# Patient Record
Sex: Female | Born: 1950 | Race: White | Hispanic: No | Marital: Married | Smoking: Never smoker
Health system: Southern US, Community
[De-identification: ages and names within clinical notes are randomized; demographics above are authoritative.]

## PROBLEM LIST (undated history)

## (undated) DIAGNOSIS — K829 Disease of gallbladder, unspecified: Secondary | ICD-10-CM

## (undated) DIAGNOSIS — J302 Other seasonal allergic rhinitis: Secondary | ICD-10-CM

## (undated) DIAGNOSIS — Z9109 Other allergy status, other than to drugs and biological substances: Secondary | ICD-10-CM

## (undated) DIAGNOSIS — K589 Irritable bowel syndrome without diarrhea: Secondary | ICD-10-CM

## (undated) DIAGNOSIS — D126 Benign neoplasm of colon, unspecified: Secondary | ICD-10-CM

## (undated) DIAGNOSIS — R0602 Shortness of breath: Secondary | ICD-10-CM

## (undated) DIAGNOSIS — C44319 Basal cell carcinoma of skin of other parts of face: Secondary | ICD-10-CM

## (undated) DIAGNOSIS — J45909 Unspecified asthma, uncomplicated: Secondary | ICD-10-CM

## (undated) DIAGNOSIS — N289 Disorder of kidney and ureter, unspecified: Secondary | ICD-10-CM

## (undated) DIAGNOSIS — R87619 Unspecified abnormal cytological findings in specimens from cervix uteri: Secondary | ICD-10-CM

## (undated) DIAGNOSIS — Z86718 Personal history of other venous thrombosis and embolism: Secondary | ICD-10-CM

## (undated) DIAGNOSIS — M722 Plantar fascial fibromatosis: Secondary | ICD-10-CM

## (undated) DIAGNOSIS — R7303 Prediabetes: Secondary | ICD-10-CM

## (undated) DIAGNOSIS — E785 Hyperlipidemia, unspecified: Secondary | ICD-10-CM

## (undated) DIAGNOSIS — Z9889 Other specified postprocedural states: Secondary | ICD-10-CM

## (undated) DIAGNOSIS — K219 Gastro-esophageal reflux disease without esophagitis: Secondary | ICD-10-CM

## (undated) DIAGNOSIS — R42 Dizziness and giddiness: Secondary | ICD-10-CM

## (undated) DIAGNOSIS — E669 Obesity, unspecified: Secondary | ICD-10-CM

## (undated) DIAGNOSIS — I82409 Acute embolism and thrombosis of unspecified deep veins of unspecified lower extremity: Secondary | ICD-10-CM

## (undated) DIAGNOSIS — Z87442 Personal history of urinary calculi: Secondary | ICD-10-CM

## (undated) DIAGNOSIS — Z8719 Personal history of other diseases of the digestive system: Secondary | ICD-10-CM

## (undated) DIAGNOSIS — F419 Anxiety disorder, unspecified: Secondary | ICD-10-CM

## (undated) DIAGNOSIS — M2550 Pain in unspecified joint: Secondary | ICD-10-CM

## (undated) DIAGNOSIS — R112 Nausea with vomiting, unspecified: Secondary | ICD-10-CM

## (undated) DIAGNOSIS — M1990 Unspecified osteoarthritis, unspecified site: Secondary | ICD-10-CM

## (undated) DIAGNOSIS — J45990 Exercise induced bronchospasm: Secondary | ICD-10-CM

## (undated) DIAGNOSIS — N201 Calculus of ureter: Secondary | ICD-10-CM

## (undated) HISTORY — DX: Unspecified asthma, uncomplicated: J45.909

## (undated) HISTORY — DX: Unspecified osteoarthritis, unspecified site: M19.90

## (undated) HISTORY — DX: Gastro-esophageal reflux disease without esophagitis: K21.9

## (undated) HISTORY — DX: Benign neoplasm of colon, unspecified: D12.6

## (undated) HISTORY — DX: Unspecified abnormal cytological findings in specimens from cervix uteri: R87.619

## (undated) HISTORY — DX: Personal history of other venous thrombosis and embolism: Z86.718

## (undated) HISTORY — DX: Plantar fascial fibromatosis: M72.2

## (undated) HISTORY — DX: Pain in unspecified joint: M25.50

## (undated) HISTORY — DX: Shortness of breath: R06.02

## (undated) HISTORY — DX: Obesity, unspecified: E66.9

## (undated) HISTORY — DX: Disorder of kidney and ureter, unspecified: N28.9

## (undated) HISTORY — PX: MOHS SURGERY: SUR867

## (undated) HISTORY — DX: Prediabetes: R73.03

## (undated) HISTORY — DX: Disease of gallbladder, unspecified: K82.9

## (undated) HISTORY — PX: CRYOTHERAPY: SHX1416

## (undated) HISTORY — DX: Irritable bowel syndrome, unspecified: K58.9

---

## 1989-06-23 HISTORY — PX: TUBAL LIGATION: SHX77

## 1990-06-23 HISTORY — PX: ABDOMINAL HYSTERECTOMY: SHX81

## 2000-02-06 ENCOUNTER — Encounter: Payer: Self-pay | Admitting: Obstetrics and Gynecology

## 2000-02-06 ENCOUNTER — Ambulatory Visit (HOSPITAL_COMMUNITY): Admission: RE | Admit: 2000-02-06 | Discharge: 2000-02-06 | Payer: Self-pay | Admitting: Obstetrics and Gynecology

## 2001-12-30 ENCOUNTER — Other Ambulatory Visit: Admission: RE | Admit: 2001-12-30 | Discharge: 2001-12-30 | Payer: Self-pay | Admitting: Obstetrics and Gynecology

## 2002-01-12 ENCOUNTER — Encounter: Payer: Self-pay | Admitting: Obstetrics and Gynecology

## 2002-01-12 ENCOUNTER — Ambulatory Visit (HOSPITAL_COMMUNITY): Admission: RE | Admit: 2002-01-12 | Discharge: 2002-01-12 | Payer: Self-pay | Admitting: Obstetrics and Gynecology

## 2003-01-24 ENCOUNTER — Encounter: Admission: RE | Admit: 2003-01-24 | Discharge: 2003-01-24 | Payer: Self-pay | Admitting: Obstetrics and Gynecology

## 2003-01-24 ENCOUNTER — Encounter: Payer: Self-pay | Admitting: Obstetrics and Gynecology

## 2004-01-09 ENCOUNTER — Ambulatory Visit (HOSPITAL_COMMUNITY): Admission: RE | Admit: 2004-01-09 | Discharge: 2004-01-09 | Payer: Self-pay | Admitting: Obstetrics and Gynecology

## 2004-01-31 ENCOUNTER — Ambulatory Visit (HOSPITAL_COMMUNITY): Admission: RE | Admit: 2004-01-31 | Discharge: 2004-01-31 | Payer: Self-pay | Admitting: Gastroenterology

## 2004-02-05 ENCOUNTER — Encounter: Admission: RE | Admit: 2004-02-05 | Discharge: 2004-02-05 | Payer: Self-pay | Admitting: Obstetrics and Gynecology

## 2004-02-07 ENCOUNTER — Encounter: Admission: RE | Admit: 2004-02-07 | Discharge: 2004-02-07 | Payer: Self-pay | Admitting: Obstetrics and Gynecology

## 2004-08-01 ENCOUNTER — Encounter: Admission: RE | Admit: 2004-08-01 | Discharge: 2004-08-01 | Payer: Self-pay | Admitting: Obstetrics and Gynecology

## 2005-02-12 ENCOUNTER — Encounter: Admission: RE | Admit: 2005-02-12 | Discharge: 2005-02-12 | Payer: Self-pay | Admitting: Obstetrics and Gynecology

## 2005-02-12 LAB — HM MAMMOGRAPHY

## 2005-07-31 ENCOUNTER — Encounter: Admission: RE | Admit: 2005-07-31 | Discharge: 2005-07-31 | Payer: Self-pay | Admitting: Obstetrics and Gynecology

## 2005-09-04 ENCOUNTER — Encounter (INDEPENDENT_AMBULATORY_CARE_PROVIDER_SITE_OTHER): Payer: Self-pay | Admitting: *Deleted

## 2005-09-04 ENCOUNTER — Ambulatory Visit (HOSPITAL_COMMUNITY): Admission: RE | Admit: 2005-09-04 | Discharge: 2005-09-05 | Payer: Self-pay | Admitting: Urology

## 2005-09-10 ENCOUNTER — Ambulatory Visit (HOSPITAL_COMMUNITY): Admission: RE | Admit: 2005-09-10 | Discharge: 2005-09-10 | Payer: Self-pay | Admitting: Family Medicine

## 2005-12-05 HISTORY — PX: OTHER SURGICAL HISTORY: SHX169

## 2005-12-22 ENCOUNTER — Ambulatory Visit: Payer: Self-pay | Admitting: Family Medicine

## 2006-01-19 ENCOUNTER — Ambulatory Visit: Payer: Self-pay | Admitting: Internal Medicine

## 2006-01-27 ENCOUNTER — Ambulatory Visit: Payer: Self-pay | Admitting: Family Medicine

## 2006-02-16 ENCOUNTER — Encounter: Admission: RE | Admit: 2006-02-16 | Discharge: 2006-02-16 | Payer: Self-pay | Admitting: Obstetrics and Gynecology

## 2006-02-27 ENCOUNTER — Ambulatory Visit: Payer: Self-pay | Admitting: Family Medicine

## 2006-05-11 ENCOUNTER — Ambulatory Visit: Payer: Self-pay | Admitting: Family Medicine

## 2006-08-05 ENCOUNTER — Ambulatory Visit: Payer: Self-pay | Admitting: Family Medicine

## 2006-08-05 ENCOUNTER — Encounter: Admission: RE | Admit: 2006-08-05 | Discharge: 2006-08-05 | Payer: Self-pay | Admitting: Physician Assistant

## 2007-02-01 ENCOUNTER — Ambulatory Visit: Payer: Self-pay | Admitting: Family Medicine

## 2007-03-10 ENCOUNTER — Encounter: Admission: RE | Admit: 2007-03-10 | Discharge: 2007-03-10 | Payer: Self-pay | Admitting: Obstetrics & Gynecology

## 2007-04-19 ENCOUNTER — Ambulatory Visit: Payer: Self-pay | Admitting: Family Medicine

## 2007-08-10 ENCOUNTER — Encounter (INDEPENDENT_AMBULATORY_CARE_PROVIDER_SITE_OTHER): Payer: Self-pay | Admitting: Family Medicine

## 2007-08-24 ENCOUNTER — Ambulatory Visit: Payer: Self-pay | Admitting: Family Medicine

## 2008-01-21 ENCOUNTER — Ambulatory Visit: Payer: Self-pay | Admitting: Family Medicine

## 2008-02-17 ENCOUNTER — Ambulatory Visit: Payer: Self-pay | Admitting: Family Medicine

## 2008-03-14 ENCOUNTER — Encounter: Admission: RE | Admit: 2008-03-14 | Discharge: 2008-03-14 | Payer: Self-pay | Admitting: Obstetrics and Gynecology

## 2008-04-06 ENCOUNTER — Ambulatory Visit: Payer: Self-pay | Admitting: Family Medicine

## 2008-06-07 ENCOUNTER — Ambulatory Visit: Payer: Self-pay | Admitting: Family Medicine

## 2008-08-21 ENCOUNTER — Ambulatory Visit: Payer: Self-pay | Admitting: Family Medicine

## 2008-10-13 ENCOUNTER — Ambulatory Visit: Payer: Self-pay | Admitting: Family Medicine

## 2008-12-13 ENCOUNTER — Ambulatory Visit: Payer: Self-pay | Admitting: Family Medicine

## 2009-03-15 ENCOUNTER — Encounter: Admission: RE | Admit: 2009-03-15 | Discharge: 2009-03-15 | Payer: Self-pay | Admitting: Obstetrics and Gynecology

## 2009-03-23 ENCOUNTER — Ambulatory Visit: Payer: Self-pay | Admitting: Family Medicine

## 2009-04-30 ENCOUNTER — Ambulatory Visit: Payer: Self-pay | Admitting: Family Medicine

## 2009-05-02 ENCOUNTER — Ambulatory Visit: Payer: Self-pay | Admitting: Family Medicine

## 2009-06-19 ENCOUNTER — Ambulatory Visit: Payer: Self-pay | Admitting: Family Medicine

## 2009-07-11 ENCOUNTER — Ambulatory Visit: Payer: Self-pay | Admitting: Family Medicine

## 2009-07-24 ENCOUNTER — Ambulatory Visit: Payer: Self-pay | Admitting: Family Medicine

## 2009-08-10 ENCOUNTER — Ambulatory Visit: Payer: Self-pay | Admitting: Family Medicine

## 2010-01-07 ENCOUNTER — Ambulatory Visit: Payer: Self-pay | Admitting: Family Medicine

## 2010-01-14 ENCOUNTER — Encounter: Admission: RE | Admit: 2010-01-14 | Discharge: 2010-01-14 | Payer: Self-pay | Admitting: Family Medicine

## 2010-01-14 ENCOUNTER — Ambulatory Visit: Payer: Self-pay | Admitting: Family Medicine

## 2010-01-15 ENCOUNTER — Encounter: Admission: RE | Admit: 2010-01-15 | Discharge: 2010-01-15 | Payer: Self-pay | Admitting: Family Medicine

## 2010-02-14 ENCOUNTER — Ambulatory Visit: Payer: Self-pay | Admitting: Family Medicine

## 2010-03-18 ENCOUNTER — Encounter: Admission: RE | Admit: 2010-03-18 | Discharge: 2010-03-18 | Payer: Self-pay | Admitting: Obstetrics and Gynecology

## 2010-06-28 ENCOUNTER — Ambulatory Visit
Admission: RE | Admit: 2010-06-28 | Discharge: 2010-06-28 | Payer: Self-pay | Source: Home / Self Care | Attending: Family Medicine | Admitting: Family Medicine

## 2010-07-15 ENCOUNTER — Encounter: Payer: Self-pay | Admitting: Obstetrics & Gynecology

## 2010-07-23 ENCOUNTER — Ambulatory Visit
Admission: RE | Admit: 2010-07-23 | Discharge: 2010-07-23 | Payer: Self-pay | Source: Home / Self Care | Attending: Family Medicine | Admitting: Family Medicine

## 2010-11-08 NOTE — Op Note (Signed)
NAME:  Carol [name], Carol [name]                         ACCOUNT NO.:  [id number]   MEDICAL RECORD NO.:  [id number]                   PATIENT TYPE:  AMB   LOCATION:  ENDO                                 FACILITY:  [facility]   PHYSICIAN:  Anselmo Rod, M.D.               DATE OF BIRTH:  13-Jul-1950   DATE OF PROCEDURE:  01/31/2004  DATE OF DISCHARGE:                                 OPERATIVE REPORT   PROCEDURE:  Screening colonoscopy.   ENDOSCOPIST:  Charna Elizabeth, M.D.   INSTRUMENT USED:  Olympus video colonoscope.   INDICATIONS FOR PROCEDURE:  60 year old white female with a history of  diverticulitis in the recent past and mild episode of rectal bleeding  undergoing screening colonoscopy to rule out colonic polyps, masses, etc.   PREPROCEDURE PREPARATION:  Informed consent was procured from the patient.  The patient was fasted for eight hours prior to the procedure and prepped  with a bottle of magnesium citrate and a gallon of GoLYTELY the night prior  to the procedure.   PREPROCEDURE PHYSICAL:  Patient with stable vital signs.  Neck supple.  Chest clear to auscultation.  S1 and S2 regular.  Abdomen soft with normal  bowel sounds.   DESCRIPTION OF PROCEDURE:  The patient was placed in the left lateral  decubitus position, sedated with 100 mg of Demerol and 10 mg Versed in slow  incremental doses.  Once the patient was adequately sedated, maintained on  low flow oxygen and continuous cardiac monitoring, the Olympus video  colonoscope was advanced from the rectum to the cecum.  There was a large  amount of residual stool in the right colon including the cecum.  Multiple  washes were done.  The appendiceal orifice and ileocecal valve were clearly  visualized.  However, mucosa lesions could have been missed secondary to  poor prep.  A few scattered sigmoid diverticula were seen.  Retroflexion of  the rectum revealed no abnormalities.   IMPRESSION:  1. A few sigmoid diverticula, otherwise,  unrevealing colonoscopy.  2. A large amount of residual stool in the colon, small lesions could have     been missed.  3. Appendicular orifice and ileocecal valve visualized but had a large     amount of stool, therefore, mucosal lesions could have been missed.   RECOMMENDATIONS:  1. Continue high fiber diet with liberal fluid intake.  2. Repeat CRC screening in the next five years unless the patient develops     any abnormal symptoms in the interim.  3. Outpatient follow up in the next two weeks for further recommendations.                                               Anselmo Rod, M.D.    JNM/MEDQ  D:  01/31/2004  T:  01/31/2004  Job:  [id number]   cc:   Laqueta Linden, M.D.  [address], [address]  [place]  [place] [address]  Fax: [phone]   Sharlot Gowda, M.D.  [address]  [place], [place] [address]  Fax: [phone]

## 2010-11-08 NOTE — Op Note (Signed)
Carol [name], Carol [name]               ACCOUNT NO.:  [id number]   MEDICAL RECORD NO.:  [id number]          PATIENT TYPE:  OIB   LOCATION:  1007                         FACILITY:  [facility]   PHYSICIAN:  [name], MD DATE OF BIRTH:  16-Feb-1951   DATE OF PROCEDURE:  09/04/2005  DATE OF DISCHARGE:  09/05/2005                                 OPERATIVE REPORT   SURGEON:  Dr. Lorin Picket MacDiarmid   ASSISTANT:  Dr. __________   PREOPERATIVE DIAGNOSES:  Vault prolapse, enterocele, and rectocele.   POSTOPERATIVE DIAGNOSES:  Vault prolapse, enterocele, and rectocele.   SURGERY:  Vault suspension (utilizing dermal graft), plus enterocele repair  plus rectocele repair, cystoscopy.   Detrice Cales' history and physical has been previously well documented.  I  felt that her vaginal apex dropped approximately 3 cm, but she primarily had  an apical defect presenting as an enterocele that would descend down to the  introitus in the standing position.  Anteriorly it was actually reasonably  well supported, especially on the physical examination when I would  resupport the posterior vaginal wall.   The patient was prepped and draped in usual fashion.  She was given  preoperative Unasyn and gentamicin.  Extra care was taken to minimize the  risk of compartment syndrome and neuropathy during leg placement.   I first marked the apex with a 3-0 Vicryl suture, identifying the cuff  dimples from her previous hysterectomy.   I then removed a small triangle of perineal skin.  She did have a high-  riding posterior fourchette which I flattened a little bit at the end of the  case.  I used my Allises in the usual fashion to make a long posterior  vaginal wall incision up until approximately 1 cm from the apex.  I  mobilized the rectovaginal fascia from the vaginal epithelium posteriorly to  the lateral sidewall.  Her vaginal tissue was a little bit thin, so I left a  little bit of fascia on the vaginal  wall so I would not buttonhole.  I then  used a Environmental consultant with blue stay hooks.  I then sharply dissected  laterally in the upper half of the vaginal vault until I could bluntly go  through the rectal pillar on her left side and right side.  I then finger  dissected down to the ischial spine bilaterally.  There is a little bit of  bleeding on the left side that stopped after a few minutes with a Ray-Tec  placed.  I then used a Capio device and placed a 0 Vicryl at the ischial  spine bilaterally, and I double checked its position after placement.  I was  happy with the placement of the apical suture.  I then used a 2-0 Vicryl  halfway along the posterior vaginal wall, picking up vaginal sidewall and  very little rectovaginal fascia.   I then trimmed the a 10 x 6 cm graft.  I left it 10 cm in the most cephalad  aspect and in a trapezoidal shape, trimmed it to approximately 6 cm in the  most  caudal end.  I used the usual technique of pink 18-French needle to  pass the sutures through, and the graft was sewn back to the ischial spine  bilaterally and also to the 2 lateral sutures halfway along the posterior  vaginal wall.  One 2-0 Vicryl was used to sew it just to the proximal edge  of the perineal body.  I was very happy with the graft placement.   It is important to note that prior to the above dissection and graft, a  rectocele repair was done and an enterocele repair.   I initially did the enterocele.  I did a digital rectal examination with a  #8 left glove.  She did have an obvious double bubble.  My assistants kept  the presumed enterocele on stretch, and I opened it and could identify the  peritoneal cavity with some fat.  I hypermobilized the enterocele sac,  keeping it on tension with Allis clamps 360 degrees all the way back to the  apex of the vagina.  I then used a 3-0 Vicryl to pursestring the apex of the  sac.  The sac was then excised.  I left the apical suture which is  3-0  Vicryl long, so I was could identify the sac later on to place a graft over  it.   I then placed my finger in the rectum again, and she had diffuse weakness  and bulging of the rectum.  I then did a gentle two-layer imbrication with  running 2-0 Vicryl, picking up a little bit of rectovaginal fascia to the  left and right of the midline.  This flattened the rectocele.  The graft was  then laid over as described above.   Hemostasis was excellent.  Total blood loss was less than 150 mL.  Copious  irrigation was utilized.  I trimmed less than 1 cm from the vaginal  epithelium bilaterally.  I closed the vaginal wall with running 0 Vicryl,  locking every 2nd suture.  There was a little bit of bleeding from the  vaginal mucosa which was __________ from the running suture.   Before I closed the vaginal epithelium, I did sew the graft over the  enterocele closure with a single 2-0 Vicryl suture.   There is no question she had excellent vaginal length at the end of the  case.  There was no ring at the end of the case.  There was little to no  bleeding.  The anterior vaginal wall was indeed well supported, and she did  not require cystocele repair.  I did 1 perineal body suture with a gentle  closure.  I then closed the perineal skin with running 0 Vicryl.   A very firm vaginal pack was inserted since she was bleeding a little bit  distally, and I think it was from the vaginal epithelium since the  rectovaginal fascia was dry throughout the case.   I cystoscoped the patient, and there was efflux of indigo carmine from both  ureteral orifices.  There was no distortion of the anatomy, i.e., the  trigone or bladder.   The patient will be kept on IV antibiotics and followed as per protocol.           ______________________________  [name], MD  Electronically Signed     SAM/MEDQ  D:  09/04/2005  T:  09/05/2005  Job:  [id number]

## 2010-11-22 ENCOUNTER — Ambulatory Visit (HOSPITAL_BASED_OUTPATIENT_CLINIC_OR_DEPARTMENT_OTHER)
Admission: RE | Admit: 2010-11-22 | Discharge: 2010-11-23 | Disposition: A | Discharge status: Home / Self Care | Payer: BC Managed Care – PPO | Source: Ambulatory Visit | Attending: Urology | Admitting: Urology

## 2010-11-22 DIAGNOSIS — N815 Vaginal enterocele: Secondary | ICD-10-CM | POA: Insufficient documentation

## 2010-11-22 DIAGNOSIS — Z01810 Encounter for preprocedural cardiovascular examination: Secondary | ICD-10-CM | POA: Insufficient documentation

## 2010-11-22 DIAGNOSIS — Z01812 Encounter for preprocedural laboratory examination: Secondary | ICD-10-CM | POA: Insufficient documentation

## 2010-11-22 HISTORY — PX: OTHER SURGICAL HISTORY: SHX169

## 2010-11-28 NOTE — Op Note (Signed)
NAMEJetty [name]             ACCOUNT NO.:  [id number]  MEDICAL RECORD NO.:  [id number]  LOCATION:                                 FACILITY:  PHYSICIAN:  Tamar Miano I. Patsi Sears, M.D. DATE OF BIRTH:  DATE OF PROCEDURE: DATE OF DISCHARGE:                              OPERATIVE REPORT   PREOPERATIVE DIAGNOSIS:  Recurrent enterocele.  POSTOPERATIVE DIAGNOSIS:  Recurrent enterocele.  OPERATION:  Posterior Pinnacle mesh sacrospinous repair.  SURGEON:  Dillen Belmontes I. Patsi Sears, M.D.  ANESTHESIA:  General LMA.  PREPARATION:  After appropriate preanesthesia, the patient was brought to the operating room and placed upon the operating room table in the dorsal supine position where general LMA anesthesia was introduced.  She was then replaced in the dorsal lithotomy position where she underwent a vaginal prep with Betadine solution and draped in the usual fashion.  REVIEW OF HISTORY:  Carol [name] is a 60 year old female, para 3-2-0, with a vaginal "bulge".  The patient is status post a rectocele repair and enterocele repair in 2007 with biologic tissue.  The patient continues to be sexually active.  There is no history of cancer, chemotherapy, or radiation.  She is status post hysterectomy.  PROCEDURE IN DETAIL:  Vaginal inspection reveals a rectocele and enterocele, grade 3.  There is no cystocele present and the urethra appears to be stable.  The patient has loss of level I support at the level of the apex.  She is status post hysterectomy, and a cervical scar was identified, and marked with a 2-0 Vicryl suture.  Area of incision was then made by triangulation of the perineal body, and then a midline incision.  Local anesthesia was injected, of a total of 50 cc with Marcaine 0.25 with epinephrine 1:200,000.  There was a large amount of posterior vaginal wall scarring, which required excision, and following this, I was able to make an incision to within approximately 2 cm of  the apex of the vagina.  Subcutaneous tissue was then dissected with both sharp and blunt dissection.  The pelvic floor was dissected, and I am able to palpate the ischial spines bilaterally.  I am then able to palpate the sacrospinous ligaments bilaterally, and the Posterior Pinnacle was then placed using the Capio device to place 1 cm medial to the ischial spines.  The mesh was then delivered through the sacrospinous ligaments, and the apical portion was sutured in place with three Monocryl sutures so that it would lay flat.  It was then brought forward in order to reduce the rectocele and the enterocele, and when this was accomplished, I cut the distal tail to fit, and then sutured the distal tail in place.  The irrigation was accomplished, and the mesh appeared to lay flat, with complete reduction of the rectocele and enterocele.  I then was able to remove scarred epithelial tissue, and closed the incision primarily with 2-0 Monocryl in a running fashion.  The distal portion of the incision was treated as a triangular repair using 2-0 Monocryl suture.  Cystoscopy was performed, which showed a normal- appearing bladder with no trauma to the bladder base.  Packing was placed, a Foley was placed, the patient was given IV  Toradol, awakened and taken to the recovery room in good condition.  She was not given a B and O suppository because of her history of codeine allergy.     Wylan Gentzler I. Patsi Sears, M.D.     SIT/MEDQ  D:  11/22/2010  T:  11/22/2010  Job:  [id number]  cc:   Carol Sinner, MD Fax: [phone]  Electronically Signed by Jethro Bolus M.D. on 11/28/2010 11:22:52 AM

## 2010-12-06 ENCOUNTER — Other Ambulatory Visit: Payer: Self-pay | Admitting: Family Medicine

## 2010-12-13 ENCOUNTER — Encounter: Payer: Self-pay | Admitting: Medical

## 2010-12-13 ENCOUNTER — Ambulatory Visit (INDEPENDENT_AMBULATORY_CARE_PROVIDER_SITE_OTHER): Payer: BC Managed Care – PPO | Admitting: Medical

## 2010-12-13 VITALS — BP 130/90 | HR 72 | Ht 61.0 in | Wt 143.0 lb

## 2010-12-13 DIAGNOSIS — R29898 Other symptoms and signs involving the musculoskeletal system: Secondary | ICD-10-CM

## 2010-12-13 NOTE — Progress Notes (Signed)
  Subjective:   HPI  Carol [name] is a 60 y.o. female who presents for right arm weakness.  She notes having surgery for pelvic prolapse on June 1. Since then she has started having weakness in her right arm. She gets a little achiness in her right elbow area as well. She has been working half days, and she notes trouble holding the pen correctly in her right hand, at times loses control with her hand and ends up writing all over the place without control.  She has difficulty opening yogurt containers. She denies any prior similar issue. Has never had surgery with her right arm. She had mentioned this at the surgeon's office during surgery followup, and they recommend she followup here. 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 She is a nonsmoker. She denies history of DVT or PE in self or family. Denies history of stroke, paresthesias, prior neck or arm trauma, no recent trauma to neck or arm, no history of carpal tunnel. She is on hormone therapy.  No other aggravating or relieving factors.  No other c/o.  The following portions of the patient's history were reviewed and updated as appropriate: allergies, current medications, past family history, past medical history, past social history, past surgical history and problem list.  Past Medical History  Diagnosis Date  . Hiatal hernia   . GERD (gastroesophageal reflux disease)   . Herpes simplex   . Menopause   . Diverticulosis   . Asthma   . S/P partial hysterectomy   . History of basal cell carcinoma 2008  . Migraine     Review of Systems Constitutional: denies fever, chills, sweats, unexpected weight change, anorexia, fatigue Dermatology: denies rash Cardiology: denies chest pain, palpitations, edema Respiratory: denies cough, shortness of breath, wheezing Gastroenterology: denies abdominal pain, nausea, vomiting, diarrhea, constipation Hematology: denies bleeding or bruising  problems Musculoskeletal: denies arthralgias, joint swelling, back pain Ophthalmology: denies vision changes Urology: denies dysuria, difficulty urinating, hematuria, urinary frequency, urgency Neurology: no headache, tingling, numbness     Objective:   Physical Exam  General appearance: alert, no distress, WD/WN, white female Neck: supple, no lymphadenopathy, no thyromegaly, no masses, non-tender, normal range of motion Heart: RRR, normal S1, S2, no murmurs Lungs: CTA bilaterally, no wheezes, rhonchi, or rales Back: non tender Musculoskeletal: right medial antecubital area with mild tenderness, no palpable cord, but otherwise, bilateral upper extremities nontender, no swelling, no obvious deformity Extremities: no edema, no cyanosis, no clubbing Pulses: 2+ symmetric, upper and lower extremities, normal cap refill Neurological: +mild asymmetrical weakness of right arm throughout compared to the left, otherwise alert, oriented x 3, CN2-12 intact, strength normal upper extremities and lower extremities, sensation normal throughout, DTRs 2+ throughout, no cerebellar signs, gait normal, negative Tinel's, but Phalen's somewhat positive on the left   Assessment :    Encounter Diagnosis  Name Primary?  . Right arm weakness Yes     Plan:   Etiology is unclear.  Differential includes cervical spine radiculopathy, degenerative disc disease in the C-spine possibly irritating a nerve, carpal tunnel syndrome, less likely TIA or DVT.   She has had the symptoms for 14month without improvement or worsening.  We will get a d-dimer, Doppler ultrasound of right upper extremity, and C-spine x-ray. If all these are normal we will refer to neurology.

## 2010-12-16 ENCOUNTER — Encounter: Payer: Self-pay | Admitting: Family Medicine

## 2010-12-16 ENCOUNTER — Ambulatory Visit
Admission: RE | Admit: 2010-12-16 | Discharge: 2010-12-16 | Disposition: A | Discharge status: Home / Self Care | Payer: BC Managed Care – PPO | Source: Ambulatory Visit | Attending: Medical | Admitting: Medical

## 2010-12-16 ENCOUNTER — Other Ambulatory Visit: Payer: BC Managed Care – PPO

## 2010-12-16 DIAGNOSIS — K5790 Diverticulosis of intestine, part unspecified, without perforation or abscess without bleeding: Secondary | ICD-10-CM

## 2010-12-16 DIAGNOSIS — K219 Gastro-esophageal reflux disease without esophagitis: Secondary | ICD-10-CM | POA: Insufficient documentation

## 2010-12-16 DIAGNOSIS — J45909 Unspecified asthma, uncomplicated: Secondary | ICD-10-CM | POA: Insufficient documentation

## 2010-12-16 DIAGNOSIS — C710 Malignant neoplasm of cerebrum, except lobes and ventricles: Secondary | ICD-10-CM

## 2010-12-16 DIAGNOSIS — K449 Diaphragmatic hernia without obstruction or gangrene: Secondary | ICD-10-CM

## 2010-12-16 DIAGNOSIS — N951 Menopausal and female climacteric states: Secondary | ICD-10-CM | POA: Insufficient documentation

## 2010-12-16 DIAGNOSIS — J4520 Mild intermittent asthma, uncomplicated: Secondary | ICD-10-CM | POA: Insufficient documentation

## 2010-12-16 DIAGNOSIS — B009 Herpesviral infection, unspecified: Secondary | ICD-10-CM | POA: Insufficient documentation

## 2010-12-16 DIAGNOSIS — C44310 Basal cell carcinoma of skin of unspecified parts of face: Secondary | ICD-10-CM | POA: Insufficient documentation

## 2010-12-16 DIAGNOSIS — Z780 Asymptomatic menopausal state: Secondary | ICD-10-CM

## 2010-12-16 NOTE — Progress Notes (Signed)
Left message for pt to return call regarding D-Dimer.  CM, LPN

## 2010-12-17 ENCOUNTER — Telehealth: Payer: Self-pay | Admitting: *Deleted

## 2010-12-17 NOTE — Telephone Encounter (Addendum)
Message copied by Dorthula Perfect on Tue Dec 17, 2010 12:53 PM ------      Message from: Aleen Campi, DAVID S      Created: Mon Dec 16, 2010  4:05 PM       Neck xray and ultrasound normal, d-dimer normal.  If still having symptoms, lets referral to neurology.   Pt was informed of ultrasound and D-dimer results, both were normal.  Pt still having symptoms and faxed over notes, labs and ultrasound to [facility] for a appointment.  Will inform pt of appointment when available.  CM,LPN

## 2011-01-14 ENCOUNTER — Other Ambulatory Visit: Payer: Self-pay | Admitting: Family Medicine

## 2011-02-04 ENCOUNTER — Telehealth: Payer: Self-pay | Admitting: Medical

## 2011-02-04 NOTE — Telephone Encounter (Signed)
Spoke with pt regarding appointment.  Pt will keep appointment in [place].  CM, LPN

## 2011-02-11 ENCOUNTER — Other Ambulatory Visit: Payer: Self-pay | Admitting: Family Medicine

## 2011-02-11 ENCOUNTER — Other Ambulatory Visit: Payer: Self-pay | Admitting: Obstetrics and Gynecology

## 2011-02-11 DIAGNOSIS — Z1231 Encounter for screening mammogram for malignant neoplasm of breast: Secondary | ICD-10-CM

## 2011-03-04 ENCOUNTER — Other Ambulatory Visit: Payer: Self-pay | Admitting: Family Medicine

## 2011-03-24 ENCOUNTER — Ambulatory Visit
Admission: RE | Admit: 2011-03-24 | Discharge: 2011-03-24 | Disposition: A | Discharge status: Home / Self Care | Payer: BC Managed Care – PPO | Source: Ambulatory Visit | Attending: Obstetrics and Gynecology | Admitting: Obstetrics and Gynecology

## 2011-03-24 DIAGNOSIS — Z1231 Encounter for screening mammogram for malignant neoplasm of breast: Secondary | ICD-10-CM

## 2011-04-06 ENCOUNTER — Other Ambulatory Visit: Payer: Self-pay | Admitting: Family Medicine

## 2011-05-08 ENCOUNTER — Other Ambulatory Visit: Payer: Self-pay | Admitting: Family Medicine

## 2011-06-17 ENCOUNTER — Other Ambulatory Visit: Payer: Self-pay | Admitting: Family Medicine

## 2011-06-27 ENCOUNTER — Ambulatory Visit (INDEPENDENT_AMBULATORY_CARE_PROVIDER_SITE_OTHER): Payer: BC Managed Care – PPO | Admitting: Family Medicine

## 2011-06-27 ENCOUNTER — Encounter: Payer: Self-pay | Admitting: Family Medicine

## 2011-06-27 DIAGNOSIS — M25561 Pain in right knee: Secondary | ICD-10-CM

## 2011-06-27 DIAGNOSIS — Z79899 Other long term (current) drug therapy: Secondary | ICD-10-CM

## 2011-06-27 DIAGNOSIS — E785 Hyperlipidemia, unspecified: Secondary | ICD-10-CM | POA: Insufficient documentation

## 2011-06-27 DIAGNOSIS — M25569 Pain in unspecified knee: Secondary | ICD-10-CM

## 2011-06-27 DIAGNOSIS — J45909 Unspecified asthma, uncomplicated: Secondary | ICD-10-CM

## 2011-06-27 LAB — CBC WITH DIFFERENTIAL/PLATELET
Basophils Absolute: 0 10*3/uL (ref 0.0–0.1)
Eosinophils Absolute: 0.2 10*3/uL (ref 0.0–0.7)
Eosinophils Relative: 2 % (ref 0–5)
MCH: 28.4 pg (ref 26.0–34.0)
MCV: 87.9 fL (ref 78.0–100.0)
Platelets: 270 10*3/uL (ref 150–400)
RDW: 13.4 % (ref 11.5–15.5)
WBC: 8.3 10*3/uL (ref 4.0–10.5)

## 2011-06-27 LAB — COMPREHENSIVE METABOLIC PANEL
ALT: 19 U/L (ref 0–35)
AST: 20 U/L (ref 0–37)
Creat: 0.79 mg/dL (ref 0.50–1.10)
Total Bilirubin: 0.3 mg/dL (ref 0.3–1.2)

## 2011-06-27 LAB — LIPID PANEL
Total CHOL/HDL Ratio: 3 Ratio
VLDL: 13 mg/dL (ref 0–40)

## 2011-06-27 MED ORDER — ALBUTEROL SULFATE HFA 108 (90 BASE) MCG/ACT IN AERS: 2.0000 | INHALATION_SPRAY | Freq: Four times a day (QID) | RESPIRATORY_TRACT | Status: DC | PRN

## 2011-06-27 NOTE — Patient Instructions (Addendum)
Stop the [facility]. Use the Proventil as needed with exercise. You can use heat on the knee as well as either Advil or Aleve. If you still have trouble over the next 2-4 weeks and come back

## 2011-06-27 NOTE — Progress Notes (Signed)
  Subjective:    Patient ID: Carol [name], female    DOB: Jan 14, 1951, 61 y.o.   MRN: [id number]  HPI She has been having difficulty with right knee pain for the last month. 6 days ago while chasing her cat she made a maneuver causing internal rotation of the knee and experienced medial joint pain. She felt a pop and it gave way. She also is here to have blood work for her cholesterol. She also has a history of asthma and has been using Asmanex and uses Dulera on an as-needed basis.   Review of Systems     Objective:   Physical Exam Right knee exam shows no effusion. Slight tenderness to palpation over the anterior medial joint. Anterior drawer negative. McMurray's testing was negative lateral collateral and medial collateral ligaments intact      Assessment & Plan:   1. Asthma    2. Hyperlipidemia LDL goal < 100  Lipid panel  3. Right knee pain    4. Encounter for long-term (current) use of other medications  CBC with Differential, Comprehensive metabolic panel, Lipid panel   supportive care for her knee and if continued difficulty, she will return here. Also recommend she stop Dulera and use Proventil for exercise-induced symptoms.

## 2011-08-04 ENCOUNTER — Telehealth: Payer: Self-pay | Admitting: Family Medicine

## 2011-08-04 MED ORDER — ATORVASTATIN CALCIUM 20 MG PO TABS: 20.0000 mg | ORAL_TABLET | Freq: Every day | ORAL | Status: DC

## 2011-08-04 NOTE — Telephone Encounter (Signed)
Refilled med and changed pharmacy

## 2011-08-12 ENCOUNTER — Ambulatory Visit (HOSPITAL_COMMUNITY)
Admission: AD | Admit: 2011-08-12 | Discharge: 2011-08-13 | Disposition: A | Discharge status: Home / Self Care | Payer: BC Managed Care – PPO | Source: Ambulatory Visit | Attending: Urology | Admitting: Urology

## 2011-08-12 ENCOUNTER — Encounter (HOSPITAL_COMMUNITY): Payer: Self-pay | Admitting: *Deleted

## 2011-08-12 ENCOUNTER — Other Ambulatory Visit: Payer: Self-pay | Admitting: Urology

## 2011-08-12 ENCOUNTER — Ambulatory Visit: Admit: 2011-08-12 | Payer: Self-pay | Admitting: Urology

## 2011-08-12 ENCOUNTER — Encounter (HOSPITAL_COMMUNITY): Payer: Self-pay | Admitting: Anesthesiology

## 2011-08-12 ENCOUNTER — Encounter (HOSPITAL_COMMUNITY)
Admission: AD | Disposition: A | Discharge status: Home / Self Care | Payer: Self-pay | Source: Ambulatory Visit | Attending: Urology

## 2011-08-12 ENCOUNTER — Ambulatory Visit (HOSPITAL_COMMUNITY): Payer: BC Managed Care – PPO | Admitting: Anesthesiology

## 2011-08-12 DIAGNOSIS — N390 Urinary tract infection, site not specified: Secondary | ICD-10-CM | POA: Insufficient documentation

## 2011-08-12 DIAGNOSIS — M545 Low back pain, unspecified: Secondary | ICD-10-CM | POA: Insufficient documentation

## 2011-08-12 DIAGNOSIS — Z90710 Acquired absence of both cervix and uterus: Secondary | ICD-10-CM | POA: Insufficient documentation

## 2011-08-12 DIAGNOSIS — R1031 Right lower quadrant pain: Secondary | ICD-10-CM | POA: Insufficient documentation

## 2011-08-12 DIAGNOSIS — N952 Postmenopausal atrophic vaginitis: Secondary | ICD-10-CM | POA: Insufficient documentation

## 2011-08-12 DIAGNOSIS — R011 Cardiac murmur, unspecified: Secondary | ICD-10-CM | POA: Insufficient documentation

## 2011-08-12 DIAGNOSIS — Z79899 Other long term (current) drug therapy: Secondary | ICD-10-CM | POA: Insufficient documentation

## 2011-08-12 DIAGNOSIS — N393 Stress incontinence (female) (male): Secondary | ICD-10-CM | POA: Insufficient documentation

## 2011-08-12 DIAGNOSIS — N816 Rectocele: Secondary | ICD-10-CM | POA: Insufficient documentation

## 2011-08-12 DIAGNOSIS — K219 Gastro-esophageal reflux disease without esophagitis: Secondary | ICD-10-CM | POA: Insufficient documentation

## 2011-08-12 DIAGNOSIS — N3941 Urge incontinence: Secondary | ICD-10-CM | POA: Insufficient documentation

## 2011-08-12 DIAGNOSIS — E7800 Pure hypercholesterolemia, unspecified: Secondary | ICD-10-CM | POA: Insufficient documentation

## 2011-08-12 DIAGNOSIS — N815 Vaginal enterocele: Secondary | ICD-10-CM | POA: Insufficient documentation

## 2011-08-12 DIAGNOSIS — R350 Frequency of micturition: Secondary | ICD-10-CM | POA: Insufficient documentation

## 2011-08-12 DIAGNOSIS — N201 Calculus of ureter: Secondary | ICD-10-CM | POA: Insufficient documentation

## 2011-08-12 DIAGNOSIS — J45909 Unspecified asthma, uncomplicated: Secondary | ICD-10-CM | POA: Insufficient documentation

## 2011-08-12 DIAGNOSIS — Z7982 Long term (current) use of aspirin: Secondary | ICD-10-CM | POA: Insufficient documentation

## 2011-08-12 HISTORY — PX: OTHER SURGICAL HISTORY: SHX169

## 2011-08-12 LAB — SURGICAL PCR SCREEN: MRSA, PCR: NEGATIVE

## 2011-08-12 SURGERY — CYSTOSCOPY, WITH RETROGRADE PYELOGRAM AND URETERAL STENT INSERTION
Anesthesia: General

## 2011-08-12 SURGERY — CYSTOURETEROSCOPY, WITH RETROGRADE PYELOGRAM AND STENT INSERTION
Anesthesia: General | Site: Ureter | Laterality: Right | Wound class: Clean Contaminated

## 2011-08-12 MED ORDER — ACETAMINOPHEN 10 MG/ML IV SOLN: 1000.0000 mg | Freq: Four times a day (QID) | INTRAVENOUS | Status: DC

## 2011-08-12 MED ORDER — PROMETHAZINE HCL 25 MG/ML IJ SOLN: 6.2500 mg | INTRAMUSCULAR | Status: DC | PRN

## 2011-08-12 MED ORDER — HYDROCODONE-IBUPROFEN 5-200 MG PO TABS: 1.0000 | ORAL_TABLET | Freq: Three times a day (TID) | ORAL | Status: AC | PRN

## 2011-08-12 MED ORDER — BELLADONNA ALKALOIDS-OPIUM 16.2-60 MG RE SUPP
RECTAL | Status: AC
  Filled 2011-08-12: qty 1

## 2011-08-12 MED ORDER — IOHEXOL 300 MG/ML  SOLN
INTRAMUSCULAR | Status: AC
  Filled 2011-08-12: qty 1

## 2011-08-12 MED ORDER — LACTATED RINGERS IV SOLN
INTRAVENOUS | Status: DC | PRN
  Administered 2011-08-12 (×2): via INTRAVENOUS

## 2011-08-12 MED ORDER — FENTANYL CITRATE 0.05 MG/ML IJ SOLN
25.0000 ug | INTRAMUSCULAR | Status: DC | PRN
  Administered 2011-08-12: 50 ug via INTRAVENOUS

## 2011-08-12 MED ORDER — FENTANYL CITRATE 0.05 MG/ML IJ SOLN
INTRAMUSCULAR | Status: AC
  Filled 2011-08-12: qty 2

## 2011-08-12 MED ORDER — KETOROLAC TROMETHAMINE 30 MG/ML IJ SOLN
INTRAMUSCULAR | Status: DC | PRN
  Administered 2011-08-12: 30 mg via INTRAVENOUS

## 2011-08-12 MED ORDER — PROPOFOL 10 MG/ML IV EMUL
INTRAVENOUS | Status: DC | PRN
  Administered 2011-08-12: 180 mg via INTRAVENOUS

## 2011-08-12 MED ORDER — MUPIROCIN 2 % EX OINT
TOPICAL_OINTMENT | CUTANEOUS | Status: AC
  Filled 2011-08-12: qty 22

## 2011-08-12 MED ORDER — DIATRIZOATE MEGLUMINE 30 % UR SOLN
URETHRAL | Status: DC | PRN
  Administered 2011-08-12: 7 mL

## 2011-08-12 MED ORDER — FENTANYL CITRATE 0.05 MG/ML IJ SOLN
INTRAMUSCULAR | Status: DC | PRN
  Administered 2011-08-12 (×2): 50 ug via INTRAVENOUS

## 2011-08-12 MED ORDER — ONDANSETRON HCL 4 MG/2ML IJ SOLN
INTRAMUSCULAR | Status: DC | PRN
  Administered 2011-08-12 (×2): 2 mg via INTRAVENOUS

## 2011-08-12 MED ORDER — LIDOCAINE HCL 2 % EX GEL
CUTANEOUS | Status: AC
  Filled 2011-08-12: qty 10

## 2011-08-12 MED ORDER — LIDOCAINE HCL 1 % IJ SOLN
INTRAMUSCULAR | Status: DC | PRN
  Administered 2011-08-12: 75 mg via INTRADERMAL

## 2011-08-12 MED ORDER — CEFAZOLIN SODIUM 1-5 GM-% IV SOLN
1.0000 g | INTRAVENOUS | Status: AC
  Administered 2011-08-12: 1 g via INTRAVENOUS

## 2011-08-12 MED ORDER — STERILE WATER FOR IRRIGATION IR SOLN
Status: DC | PRN
  Administered 2011-08-12: 1000 mL

## 2011-08-12 MED ORDER — CEFAZOLIN SODIUM 1-5 GM-% IV SOLN
INTRAVENOUS | Status: AC
  Filled 2011-08-12: qty 50

## 2011-08-12 MED ORDER — BELLADONNA ALKALOIDS-OPIUM 16.2-60 MG RE SUPP
RECTAL | Status: DC | PRN
  Administered 2011-08-12: 1 via RECTAL

## 2011-08-12 MED ORDER — METOCLOPRAMIDE HCL 5 MG/ML IJ SOLN
INTRAMUSCULAR | Status: DC | PRN
  Administered 2011-08-12: 5 mg via INTRAVENOUS

## 2011-08-12 MED ORDER — DEXAMETHASONE SODIUM PHOSPHATE 10 MG/ML IJ SOLN
INTRAMUSCULAR | Status: DC | PRN
  Administered 2011-08-12: 10 mg via INTRAVENOUS

## 2011-08-12 MED ORDER — TRIMETHOPRIM 100 MG PO TABS: 100.0000 mg | ORAL_TABLET | ORAL | Status: AC

## 2011-08-12 MED ORDER — URELLE 81 MG PO TABS: 1.0000 | ORAL_TABLET | Freq: Three times a day (TID) | ORAL | Status: DC

## 2011-08-12 MED ORDER — MIDAZOLAM HCL 5 MG/5ML IJ SOLN
INTRAMUSCULAR | Status: DC | PRN
  Administered 2011-08-12 (×2): 1 mg via INTRAVENOUS

## 2011-08-12 SURGICAL SUPPLY — 21 items
ADAPTER CATH URET PLST 4-6FR (CATHETERS) ×3 IMPLANT
ADPR CATH URET STRL DISP 4-6FR (CATHETERS) ×2
BAG URO CATCHER STRL LF (DRAPE) ×3 IMPLANT
CATH INTERMIT  6FR 70CM (CATHETERS) ×3 IMPLANT
CATH URET 5FR 28IN OPEN ENDED (CATHETERS) ×3 IMPLANT
CLOTH BEACON ORANGE TIMEOUT ST (SAFETY) ×3 IMPLANT
DRAPE CAMERA CLOSED 9X96 (DRAPES) ×3 IMPLANT
GLOVE BIOGEL M STRL SZ7.5 (GLOVE) ×3 IMPLANT
GLOVE SURG SS PI 8.0 STRL IVOR (GLOVE) ×3 IMPLANT
GOWN PREVENTION PLUS XLARGE (GOWN DISPOSABLE) ×3 IMPLANT
GOWN STRL NON-REIN LRG LVL3 (GOWN DISPOSABLE) ×3 IMPLANT
GOWN STRL REIN XL XLG (GOWN DISPOSABLE) ×3 IMPLANT
GUIDEWIRE STR DUAL SENSOR (WIRE) ×3 IMPLANT
LASER FIBER DISP (UROLOGICAL SUPPLIES) ×3 IMPLANT
MANIFOLD NEPTUNE II (INSTRUMENTS) ×3 IMPLANT
MARKER SKIN DUAL TIP RULER LAB (MISCELLANEOUS) ×3 IMPLANT
NS IRRIG 1000ML POUR BTL (IV SOLUTION) ×3 IMPLANT
PACK CYSTO (CUSTOM PROCEDURE TRAY) ×3 IMPLANT
SCRUB PCMX 4 OZ (MISCELLANEOUS) ×3 IMPLANT
STENT CONTOUR 6FRX24X.038 (STENTS) ×1 IMPLANT
TUBING CONNECTING 10 (TUBING) ×3 IMPLANT

## 2011-08-12 NOTE — Anesthesia Preprocedure Evaluation (Addendum)
Anesthesia Evaluation  Patient identified by MRN, date of birth, ID band Patient awake  General Assessment Comment:Cereal @ 0800  Reviewed: Allergy & Precautions, H&P , NPO status , Patient's Chart, lab work & pertinent test results  Airway Mallampati: II TM Distance: <3 FB Neck ROM: Full    Dental No notable dental hx.    Pulmonary asthma ,  clear to auscultation  Pulmonary exam normal       Cardiovascular neg cardio ROS Regular Normal    Neuro/Psych Negative Neurological ROS  Negative Psych ROS   GI/Hepatic Neg liver ROS, GERD-  Medicated,  Endo/Other  Negative Endocrine ROS  Renal/GU negative Renal ROS  Genitourinary negative   Musculoskeletal negative musculoskeletal ROS (+)   Abdominal   Peds negative pediatric ROS (+)  Hematology negative hematology ROS (+)   Anesthesia Other Findings   Reproductive/Obstetrics negative OB ROS                          Anesthesia Physical Anesthesia Plan  ASA: II  Anesthesia Plan: General   Post-op Pain Management:    Induction: Intravenous  Airway Management Planned: LMA  Additional Equipment:   Intra-op Plan:   Post-operative Plan:   Informed Consent: I have reviewed the patients History and Physical, chart, labs and discussed the procedure including the risks, benefits and alternatives for the proposed anesthesia with the patient or authorized representative who has indicated his/her understanding and acceptance.   Dental advisory given  Plan Discussed with: CRNA  Anesthesia Plan Comments:         Anesthesia Quick Evaluation

## 2011-08-12 NOTE — Transfer of Care (Signed)
Immediate Anesthesia Transfer of Care Note  Patient: Carol [name]  Procedure(s) Performed: Procedure(s) (LRB): CYSTOSCOPY WITH RETROGRADE PYELOGRAM, URETEROSCOPY AND STENT PLACEMENT (Right) HOLMIUM LASER APPLICATION (Right)  Patient Location: PACU  Anesthesia Type: General  Level of Consciousness: sedated  Airway & Oxygen Therapy: Patient Spontanous Breathing and Patient connected to face mask oxygen  Post-op Assessment: Report given to PACU RN and Post -op Vital signs reviewed and stable  Post vital signs: Reviewed and stable  Complications: No apparent anesthesia complications

## 2011-08-12 NOTE — Discharge Instructions (Signed)
DISCHARGE INSTRUCTIONS FOR KIDNEY STONES OR URETERAL STENT  MEDICATIONS:   1. DO NOT RESUME YOUR ASPIRIN, or any other medicines like ibuprofen, motrin, excedrin, advil, aleve, vitamin E, fish oil as these can all cause bleeding x 7 days.  2. Resume all your other meds from home - except do not take any other pain meds that you may have at home.  ACTIVITY 1. No strenuous activity x 1week 2. No driving while on narcotic pain medications 3. Drink plenty of water 4. Continue to walk at home - you can still get blood clots when you are at home, so keep active, but don't over do it. 5. May return to work in 3 days.  BATHING 1. You can shower and we recommend daily showers  2. If you have a string coming from your urethra:  The stent string is attached to your ureteral stent.  Do not pull on this.   SIGNS/SYMPTOMS TO CALL: 1. Please call us if you have a fever greater than 101.5, uncontrolled  nausea/vomiting, uncontrolled pain, dizziness, unable to urinate, bloody urine, chest pain, shortness of breath, leg swelling, leg pain, redness around wound, drainage from wound, or any other concerns or questions.  You can reach [place] at [phone].  FOLLOW-UP You have an appointment: call [phone] for appointment for stent removal.    You may feel an odd sensation in your back. OK for occasional blood in the urine.   Kidney Stones Kidney stones (ureteral lithiasis) are solid masses that form inside your kidneys. The intense pain is caused by the stone moving through the kidney, ureter, bladder, and urethra (urinary tract). When the stone moves, the ureter starts to spasm around the stone. The stone is usually passed in the urine.  HOME CARE  Drink enough fluids to keep your pee (urine) clear or pale yellow. This helps to get the stone out.   Strain all pee through the provided strainer. Do not pee without peeing through the strainer, not even once. If you pee the stone out, catch it. The stone  may be as small as a grain of salt. Take this to your doctor.   Only take medicine as told by your doctor.   Follow up with your doctor as told.   Get follow-up X-rays as told by your doctor.  GET HELP RIGHT AWAY IF:   Your pain does not get better with medicine.   You have a fever.   Your pain increases and gets worse over 18 hours.   You have new belly (abdominal) pain.   You feel faint or pass out.  MAKE SURE YOU:   Understand these instructions.   Will watch your condition.   Will get help right away if you are not doing well or get worse.  Document Released: 11/26/2007 Document Revised: 02/19/2011 Document Reviewed: 04/06/2009 [facility] Patient Information 2012 [place], [place].

## 2011-08-12 NOTE — Op Note (Signed)
Pre-operative diagnosis : Right lower ureteral calculus  Postoperative diagnosis: Impacted right lower ureteral calculus  Operation: Cystourethroscopy, right retrograde pyelogram with interpretation, right ureteroscopy, laser fragmentation of right lower ureteral calculus, basket extraction of stone fragments, right double-J stent (6 [place] by 24 cm).  Surgeon:  Kathie Rhodes. Patsi Sears, MD  First assistant: None  Anesthesia:  general  Preparation: After appropriate preanesthesia, the patient was brought to the operating room and placed upon the operating table in the dorsal supine position. The right arm had previously been marked. Dental LMA anesthesia was introduced, and the patient was placed in the dorsal lithotomy position where the pubis was prepped with Betadine solution and draped in usual fashion.  Review history: The patient is a 61 year old female, with nonprogression and impaction of right lower ureteral [place], measuring 6 mm, with continued right ureteral colic. She is now for laser fragmentation of stone and extraction.  Statement of  Likelihood of Success: Excellent. TIME-OUT observed.:  Procedure: After timeout, the patient was prepped with Betadine solution draped in usual fashion, and cystoscopy revealed normal-appearing urethra, normal bladder base. Ureteral orifices appeared normal. Right retrograde PolyGram was performed, and there appeared to be obstruction in the right lower ureter, above the level of the bladder route. There was hydronephrosis above the level of stone, although the stone could not be seen well on fluoroscopy. Guidewire was passed in the renal pelvis under fluoroscopic control. Ureteroscopy was accomplished, and showed that the intramural ureter was very tight. The ureter ureteroscope was passed along the line of the guidewire, stone was identified, photographed mid. Using the laser under low power, the stone was powdered, and fragments removed for examination.  Remaining ureter appeared within normal limits. Right double-J stent was placed because of the severe tightness and scarring of her right lower ureter, where the stone had been impacted. The double-J stent was in good position under fluoroscopic control. The patient tolerated procedure well, and was awakened after I given IV portal. He was not given any IV Tylenol.

## 2011-08-12 NOTE — H&P (Signed)
Reason For Visit  Rt flank pain & increased frequency   Active Problems Problems  1. Female Stress Incontinence 625.6 2. Lower Back Pain 724.2 3. 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 Rectocele 596.89 5. Urge Incontinence Of Urine 788.31 6. Urinary Incontinence 788.30 7. Urinary Tract Infection 599.0 8. 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  She was last seen by Jetta Lout, NP on 08/08/11 for significant right flank pain with nauase. Denies hematuria or fever/chills. No h/o nephrolithasis. She was treated for a possible UTI with Rocephin 500mg  & Cipro 500mg  BID x 5 days.  08/08/11  C&S - 100,000 colonies of Aerococcus bacteria. No succeptability reported.     H/O UUI.  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 She has failed Vesicare 10 mg 1 po daily but feels no real improvement with Toviaz 8 mg 1 po daily.  She is s/p posterior pinnacle mesh sacrospinous repair of enterocele on 11/22/10. She is using Miralax for constipation with success. Rectal spasms have resolved.  Was originally referred by Dr. Sherron Monday for rectocele evaluation.  Para 3-3-0, with no tobacco. Since Easter she  had some vaginal bulging sensation. She feels some vibration in the vagina. It comes and goes. It is a daily issue. There is no other modifying factors or associated signs or symptoms. The symptoms are mild in severity but persistent. There is no other aggravating or relieving factors.    Dr. Sherron Monday did a vaginal vault suspension utilizing dermal graft plus enterocele repair and rectocele repair in March 2007. She had mild stress incontinence at the time wearing a light pad. She was sexually active but does not have any vaginal  penetration. 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   Interval HX:  Today states for the past 24 hrs has had R flank pain. She has a hx of fhospitalization for pyelonephritis. 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 She has a hx of pain level of 12/10 Friday, and is now 9/10. No fever no chills, and no nausea, no hematuria. Occasional skin burning.   Past Medical History Problems  1. History of  Anxiety (Symptom) 300.00 2. History of  Asthma 493.90 3. History of  Esophageal Reflux 530.81 4. History of  Hypercholesterolemia 272.0 5. History of  Murmurs 785.2  Surgical History Problems  1. History of  Bladder Surgery 2. History of  Cesarean Section 3. History of  Hysterectomy V45.77 4. History of  Posterior Colporrhaphy (For Pelvic Relaxation) 5. History of  Sacrospinous Ligament Fixation For Posthysterectomy Prolapse 6. History of  Tubal Ligation V25.2 7. History of  Vaginal Surg Insertion Of Mesh For Pelvic Floor Repair  Current Meds 1. Aspirin 81 MG Oral Tablet; Therapy: (Recorded:09Oct2012) to 2. Ciprofloxacin HCl 500 MG Oral Tablet; Take 1 tablet twice daily; Therapy: 15Feb2013 to (Last  Rx:15Feb2013)  Requested for: 15Feb2013 3. Clidinium-Chlordiazepoxide 2.5-5 MG Oral Capsule; Therapy: 10Nov2011 to 4. Dexilant 60 MG Oral Capsule Delayed Release; Therapy: 28Jan2011 to 5. Dulera AERO; Therapy: (Recorded:02May2012) to 6. Estradiol Cream 0.02%; Use 1ml syringe 3 x a week; Therapy: 09Oct2012 to (Last Rx:09Oct2012)   Requested for: 09Oct2012 7.  Lipitor 20 MG Oral Tablet; Therapy: (Recorded:02May2012) to 8. LORazepam 0.5 MG Oral Tablet; Therapy: (Recorded:02May2012) to 9. Maxalt 10 MG Oral Tablet; Therapy: 23Apr2012 to 10. Methocarbamol 500 MG Oral Tablet; Therapy: 23Apr2012 to 11. MiraLax Oral Powder; Therapy: (Recorded:16Jul2012) to 12. Patanol 0.1 % Ophthalmic Solution; Therapy: 26Mar2012 to 13. ProAir HFA 108 (90 Base)  MCG/ACT Inhalation Aerosol Solution; Therapy: 04Jan2013 to 14. Restasis 0.05 % Ophthalmic Emulsion; Therapy: 29Jan2013 to 15. Stool Softener CAPS; Therapy: (Recorded:02May2012) to 16. TiZANidine HCl 4 MG Oral Tablet; Therapy: 13Sep2011 to 17. ValACYclovir HCl 500 MG Oral Tablet; Therapy: 15Jun2012 to 18. Vitamin D (Ergocalciferol) 50000 UNIT Oral Capsule; Therapy: 10Apr2012 to 19. Vivelle-Dot 0.05 MG/24HR Transdermal Patch Biweekly; Therapy: 19Aug2011 to 20. Zonisamide 100 MG Oral Capsule; Therapy: 13Sep2011 to  Allergies Medication  1. Codeine Derivatives Non-Medication  2. Contrast Dye  Family History Problems  1. Paternal history of  Bladder Cancer V16.52 2. Maternal history of  Blood In Urine 3. Paternal history of  Death In The Family Father father passed @ age 54staph infection 4. Maternal history of  Death In The Family Mother mother passed @ age 73brain bleed 5. Family history of  Family Health Status Number Of Children 1 son2 daughters 6. Maternal history of  Nephrolithiasis  Social History Problems  1. Alcohol Use 2 drinks per week 2. Caffeine Use no caffeine 3. Marital History - Single female partner 4. 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 History of  Tobacco Use  Review of Systems Constitutional, skin, eye, otolaryngeal, cardiovascular, pulmonary, endocrine, gastrointestinal, neurological and psychiatric system(s) were reviewed and pertinent findings if present are noted.  Genitourinary: urinary frequency.  Musculoskeletal: Rt flank pain.    Vitals Vital Signs [Data Includes: Last 1 Day]  19Feb2013 10:47AM  Blood Pressure: 123 / 83 Temperature: 97.9 F Heart Rate: 73  Physical Exam Constitutional: Well nourished and well developed . No acute distress.  ENT:. The ears and nose are normal in appearance.  Neck: The appearance of the neck is normal and no neck mass is present.  Pulmonary: No respiratory distress and normal  respiratory rhythm and effort.  Cardiovascular: Heart rate and rhythm are normal . No peripheral edema.  Abdomen: The abdomen is rounded, but not distended. The abdomen is soft and nontender. No masses are palpated. moderate right CVA tenderness. Bowel sounds are diminished. No hernias are palpable. No hepatosplenomegaly noted.  Lymphatics: The femoral and inguinal nodes are not enlarged or tender.  Skin: Normal skin turgor, no visible rash and no visible skin lesions.  Neuro/Psych:. Mood and affect are appropriate.    Results/Data  Urine [Data Includes: Last 1 Day]   19Feb2013  COLOR YELLOW   APPEARANCE CLEAR   SPECIFIC GRAVITY 1.025   pH 5.5   GLUCOSE NEG mg/dL  BILIRUBIN NEG   KETONE NEG mg/dL  BLOOD SMALL   PROTEIN NEG mg/dL  UROBILINOGEN 0.2 mg/dL  NITRITE NEG   LEUKOCYTE ESTERASE SMALL   SQUAMOUS EPITHELIAL/HPF FEW   WBC 11-20 WBC/hpf  RBC 11-20 RBC/hpf  BACTERIA RARE   CRYSTALS NONE SEEN   CASTS NONE SEEN    12 Aug 2011 9:00 AM   UA With REFLEX       COLOR YELLOW       APPEARANCE CLEAR       SPECIFIC GRAVITY 1.025       pH 5.5       GLUCOSE NEG       BILIRUBIN NEG  KETONE NEG       BLOOD SMALL       PROTEIN NEG       UROBILINOGEN 0.2       NITRITE NEG       LEUKOCYTE ESTERASE SMALL       SQUAMOUS EPITHELIAL/HPF FEW       WBC 11-20       CRYSTALS NONE SEEN       CASTS NONE SEEN       RBC 11-20       BACTERIA RARE    Assessment Assessed  1. Flank Pain Quality Sharp 2. Flank Pain Right         Continued R flank pin. U/a appears to have no bacteria but wbc and rbc present. CT without contrast shows R hydronephrosis, wtih possible pelvic stone. Will Rx toradol for now. It has brought pain down to a 6/10. She desires reloief of her pain, so will plan cysto, retrograde pyelogram, and possible stone removal. or JJ stent. Plan as outpatient.   Plan  Microscopic Hematuria (599.72), Flank Pain Quality Sharp, Flank Pain Right  1. AU CT-STONE PROTOCOL  Done:  19Feb2013 12:00AM      1. Toradol 60 mg/ today.  2. Allergy to IVP dye precludes IV contrasted CT, so will ck her stone protocol CT. If necessary , wll ck MRI.  UA With REFLEX  Status: Resulted - Requires Verification  Done: 01Jan0001 12:00AM 3. For OR add-on today.  Ordered Today; For: Health Maintenance (V70.0); Ordered By: Jethro Bolus  Due: 21Feb2013 Marked Important; Last Updated By: Emmaline Life   Signatures Electronically signed by : Jethro Bolus, M.D.; Aug 12 2011 12:29PM

## 2011-08-12 NOTE — Anesthesia Postprocedure Evaluation (Signed)
  Anesthesia Post-op Note  Patient: Carol [name]  Procedure(s) Performed: Procedure(s) (LRB): CYSTOSCOPY WITH RETROGRADE PYELOGRAM, URETEROSCOPY AND STENT PLACEMENT (Right) HOLMIUM LASER APPLICATION (Right)  Patient Location: PACU  Anesthesia Type: General  Level of Consciousness: awake and alert   Airway and Oxygen Therapy: Patient Spontanous Breathing  Post-op Pain: mild  Post-op Assessment: Post-op Vital signs reviewed, Patient's Cardiovascular Status Stable, Respiratory Function Stable, Patent Airway and No signs of Nausea or vomiting  Post-op Vital Signs: stable  Complications: No apparent anesthesia complications

## 2011-08-12 NOTE — Interval H&P Note (Signed)
History and Physical Interval Note:  08/12/2011 5:53 PM  [name]  has presented today for surgery, with the diagnosis of right ureteral obstruction  The various methods of treatment have been discussed with the patient and family. After consideration of risks, benefits and other options for treatment, the patient has consented to  Procedure(s) (LRB): CYSTOSCOPY WITH RETROGRADE PYELOGRAM, URETEROSCOPY AND STENT PLACEMENT (Right) HOLMIUM LASER APPLICATION (Right) as a surgical intervention .  The patients' history has been reviewed, patient examined, no change in status, stable for surgery.  I have reviewed the patients' chart and labs.  Questions were answered to the patient's satisfaction.     Carol [name] I

## 2011-10-21 ENCOUNTER — Other Ambulatory Visit: Payer: Self-pay | Admitting: Urology

## 2011-10-21 ENCOUNTER — Encounter (HOSPITAL_BASED_OUTPATIENT_CLINIC_OR_DEPARTMENT_OTHER): Payer: Self-pay | Admitting: Anesthesiology

## 2011-10-21 ENCOUNTER — Ambulatory Visit (HOSPITAL_BASED_OUTPATIENT_CLINIC_OR_DEPARTMENT_OTHER)
Admission: RE | Admit: 2011-10-21 | Discharge: 2011-10-21 | Disposition: A | Discharge status: Home / Self Care | Payer: BC Managed Care – PPO | Source: Ambulatory Visit | Attending: Urology | Admitting: Urology

## 2011-10-21 ENCOUNTER — Encounter (HOSPITAL_BASED_OUTPATIENT_CLINIC_OR_DEPARTMENT_OTHER): Payer: Self-pay | Admitting: *Deleted

## 2011-10-21 ENCOUNTER — Ambulatory Visit (HOSPITAL_BASED_OUTPATIENT_CLINIC_OR_DEPARTMENT_OTHER): Payer: BC Managed Care – PPO | Admitting: Anesthesiology

## 2011-10-21 ENCOUNTER — Encounter (HOSPITAL_BASED_OUTPATIENT_CLINIC_OR_DEPARTMENT_OTHER)
Admission: RE | Disposition: A | Discharge status: Home / Self Care | Payer: Self-pay | Source: Ambulatory Visit | Attending: Urology

## 2011-10-21 DIAGNOSIS — Z7982 Long term (current) use of aspirin: Secondary | ICD-10-CM | POA: Insufficient documentation

## 2011-10-21 DIAGNOSIS — N23 Unspecified renal colic: Secondary | ICD-10-CM

## 2011-10-21 DIAGNOSIS — N201 Calculus of ureter: Secondary | ICD-10-CM | POA: Insufficient documentation

## 2011-10-21 DIAGNOSIS — K219 Gastro-esophageal reflux disease without esophagitis: Secondary | ICD-10-CM | POA: Insufficient documentation

## 2011-10-21 DIAGNOSIS — J45909 Unspecified asthma, uncomplicated: Secondary | ICD-10-CM | POA: Insufficient documentation

## 2011-10-21 DIAGNOSIS — R011 Cardiac murmur, unspecified: Secondary | ICD-10-CM | POA: Insufficient documentation

## 2011-10-21 DIAGNOSIS — Z79899 Other long term (current) drug therapy: Secondary | ICD-10-CM | POA: Insufficient documentation

## 2011-10-21 DIAGNOSIS — E7800 Pure hypercholesterolemia, unspecified: Secondary | ICD-10-CM | POA: Insufficient documentation

## 2011-10-21 HISTORY — DX: Hyperlipidemia, unspecified: E78.5

## 2011-10-21 HISTORY — DX: Personal history of other diseases of the digestive system: Z87.19

## 2011-10-21 HISTORY — DX: Exercise induced bronchospasm: J45.990

## 2011-10-21 HISTORY — DX: Other seasonal allergic rhinitis: J30.2

## 2011-10-21 HISTORY — DX: Personal history of urinary calculi: Z87.442

## 2011-10-21 HISTORY — DX: Other allergy status, other than to drugs and biological substances: Z91.09

## 2011-10-21 HISTORY — DX: Calculus of ureter: N20.1

## 2011-10-21 HISTORY — PX: CYSTOSCOPY/RETROGRADE/URETEROSCOPY/STONE EXTRACTION WITH BASKET: SHX5317

## 2011-10-21 LAB — POCT HEMOGLOBIN-HEMACUE: Hemoglobin: 13.7 g/dL (ref 12.0–15.0)

## 2011-10-21 SURGERY — CYSTOSCOPY, WITH CALCULUS REMOVAL USING BASKET
Anesthesia: General | Site: Bladder | Laterality: Right | Wound class: Clean Contaminated

## 2011-10-21 MED ORDER — FENTANYL CITRATE 0.05 MG/ML IJ SOLN
25.0000 ug | INTRAMUSCULAR | Status: DC | PRN
  Administered 2011-10-21: 25 ug via INTRAVENOUS

## 2011-10-21 MED ORDER — SUCCINYLCHOLINE CHLORIDE 20 MG/ML IJ SOLN
INTRAMUSCULAR | Status: DC | PRN
  Administered 2011-10-21: 100 mg via INTRAVENOUS

## 2011-10-21 MED ORDER — METOCLOPRAMIDE HCL 5 MG/ML IJ SOLN
INTRAMUSCULAR | Status: DC | PRN
  Administered 2011-10-21: 5 mg via INTRAVENOUS

## 2011-10-21 MED ORDER — SODIUM CHLORIDE 0.9 % IR SOLN
Status: DC | PRN
  Administered 2011-10-21: 6000 mL

## 2011-10-21 MED ORDER — ACETAMINOPHEN 10 MG/ML IV SOLN
INTRAVENOUS | Status: DC | PRN
  Administered 2011-10-21: 1000 mg via INTRAVENOUS

## 2011-10-21 MED ORDER — KETOROLAC TROMETHAMINE 30 MG/ML IJ SOLN
INTRAMUSCULAR | Status: DC | PRN
  Administered 2011-10-21: 30 mg via INTRAVENOUS

## 2011-10-21 MED ORDER — MIDAZOLAM HCL 5 MG/5ML IJ SOLN
INTRAMUSCULAR | Status: DC | PRN
  Administered 2011-10-21 (×2): 1 mg via INTRAVENOUS

## 2011-10-21 MED ORDER — CEFAZOLIN SODIUM 1-5 GM-% IV SOLN
1.0000 g | INTRAVENOUS | Status: AC
  Administered 2011-10-21: 1 g via INTRAVENOUS

## 2011-10-21 MED ORDER — LACTATED RINGERS IV SOLN
INTRAVENOUS | Status: DC
  Administered 2011-10-21: 14:00:00 via INTRAVENOUS

## 2011-10-21 MED ORDER — TRAMADOL-ACETAMINOPHEN 37.5-325 MG PO TABS: 1.0000 | ORAL_TABLET | Freq: Four times a day (QID) | ORAL | Status: AC | PRN

## 2011-10-21 MED ORDER — EPHEDRINE SULFATE 50 MG/ML IJ SOLN
INTRAMUSCULAR | Status: DC | PRN
  Administered 2011-10-21: 5 mg via INTRAVENOUS

## 2011-10-21 MED ORDER — LIDOCAINE HCL (CARDIAC) 20 MG/ML IV SOLN
INTRAVENOUS | Status: DC | PRN
  Administered 2011-10-21: 70 mg via INTRAVENOUS

## 2011-10-21 MED ORDER — DEXAMETHASONE SODIUM PHOSPHATE 4 MG/ML IJ SOLN
INTRAMUSCULAR | Status: DC | PRN
  Administered 2011-10-21: 10 mg via INTRAVENOUS

## 2011-10-21 MED ORDER — FENTANYL CITRATE 0.05 MG/ML IJ SOLN
INTRAMUSCULAR | Status: DC | PRN
  Administered 2011-10-21 (×2): 50 ug via INTRAVENOUS

## 2011-10-21 MED ORDER — ONDANSETRON HCL 4 MG/2ML IJ SOLN
INTRAMUSCULAR | Status: DC | PRN
  Administered 2011-10-21 (×2): 2 mg via INTRAVENOUS

## 2011-10-21 MED ORDER — PROPOFOL 10 MG/ML IV EMUL
INTRAVENOUS | Status: DC | PRN
  Administered 2011-10-21: 150 mg via INTRAVENOUS

## 2011-10-21 MED ORDER — PHENAZOPYRIDINE HCL 200 MG PO TABS: 200.0000 mg | ORAL_TABLET | Freq: Three times a day (TID) | ORAL | Status: AC | PRN

## 2011-10-21 SURGICAL SUPPLY — 17 items
ADAPTER CATH URET PLST 4-6FR (CATHETERS) ×1 IMPLANT
ADPR CATH URET STRL DISP 4-6FR (CATHETERS) ×1
BASKET ZERO TIP NITINOL 2.4FR (BASKET) ×2 IMPLANT
BSKT STON RTRVL ZERO TP 2.4FR (BASKET) ×1
CANISTER SUCT LVC 12 LTR MEDI- (MISCELLANEOUS) ×1 IMPLANT
CATH INTERMIT  6FR 70CM (CATHETERS) ×2 IMPLANT
CLOTH BEACON ORANGE TIMEOUT ST (SAFETY) ×1 IMPLANT
GLOVE BIO SURGEON STRL SZ 6.5 (GLOVE) ×2 IMPLANT
GLOVE BIO SURGEON STRL SZ7.5 (GLOVE) ×1 IMPLANT
GLOVE ECLIPSE 6.0 STRL STRAW (GLOVE) ×1 IMPLANT
GOWN PREVENTION PLUS LG XLONG (DISPOSABLE) ×1 IMPLANT
GOWN SURGICAL LARGE (GOWNS) ×1 IMPLANT
GOWN SURGICAL XLG (GOWNS) ×1 IMPLANT
GUIDEWIRE STR DUAL SENSOR (WIRE) ×1 IMPLANT
IV NS IRRIG 3000ML ARTHROMATIC (IV SOLUTION) ×2 IMPLANT
LASER FIBER DISP (UROLOGICAL SUPPLIES) ×2 IMPLANT
PACK CYSTOSCOPY (CUSTOM PROCEDURE TRAY) ×1 IMPLANT

## 2011-10-21 NOTE — Op Note (Signed)
Pre-operative diagnosis : Right distal ureteral calculus with renal colic  Postoperative diagnosis: Same  Operation: Cystourethroscopy, right retrograde PolyGram interpretation, right ureteroscopy, basket extraction of right distal ureteral calculus.  Surgeon:  Kathie Rhodes. Patsi Sears, MD  First assistant: None  [id number]}  Preparation: After appropriate preanesthesia, the patient was brought to the operating room, placed on the operating table where the R. Mann with double-J. She then underwent general LMA anesthesia, and was replaced in the dorsal lithotomy position where the pubis was prepped with Betadine solution and draped in usual fashion.  Review history: The patient is a 61 year old female, with three-day history of right renal colic, with nausea, but no vomiting. No gross hematuria. She is status post basket extraction of right ureteral calculus in the past. She is now for removal of her stone.  Statement of  Likelihood of Success: Excellent. TIME-OUT observed.:  Procedure: Cystourethroscopy was accomplished, shows normal-appearing bladder base. There is no edema of the right ureter or the right ureteral orifice. There is no evidence of bladder stone tumor or diverticular formation. The urethra is normal the vagina is normal.  Right retrograde Cyd Silence was performed, shows a distal right ureteral calculus, which appears the same as seen on KUB and CT this morning. S. extraction of the stone was accomplished without laser fragmentation. Repeat ureteroscopy shows dilated upper ureter, but no other obstructions identified. The stone is captured, and taken for analysis. No double-J stent was placed. The patient did have IV Toradol at the end of the procedure, and IV Tylenol at the beginning of the procedure.

## 2011-10-21 NOTE — Anesthesia Preprocedure Evaluation (Addendum)
Anesthesia Evaluation  Patient identified by MRN, date of birth, ID band Patient awake    Reviewed: Allergy & Precautions, H&P , NPO status , Patient's Chart, lab work & pertinent test results  Airway Mallampati: II TM Distance: >3 FB Neck ROM: Full    Dental No notable dental hx.    Pulmonary asthma ,  No recent significant wheezing. Lungs clear. Exercise induced asthma. breath sounds clear to auscultation  Pulmonary exam normal       Cardiovascular negative cardio ROS  Rhythm:Regular Rate:Normal     Neuro/Psych  Headaches,  Neuromuscular disease negative psych ROS   GI/Hepatic Neg liver ROS, hiatal hernia, GERD-  Medicated,  Endo/Other  negative endocrine ROS  Renal/GU negative Renal ROS  negative genitourinary   Musculoskeletal negative musculoskeletal ROS (+)   Abdominal   Peds negative pediatric ROS (+)  Hematology negative hematology ROS (+)   Anesthesia Other Findings   Reproductive/Obstetrics negative OB ROS                          Anesthesia Physical Anesthesia Plan  ASA: II and Emergent  Anesthesia Plan: General   Post-op Pain Management:    Induction: Intravenous  Airway Management Planned: Oral ETT  Additional Equipment:   Intra-op Plan:   Post-operative Plan: Extubation in OR  Informed Consent: I have reviewed the patients History and Physical, chart, labs and discussed the procedure including the risks, benefits and alternatives for the proposed anesthesia with the patient or authorized representative who has indicated his/her understanding and acceptance.   Dental advisory given  Plan Discussed with: CRNA  Anesthesia Plan Comments: (NPO after 0830 (couple bites of cereal). Has been nauseated today.)      Anesthesia Quick Evaluation

## 2011-10-21 NOTE — Anesthesia Procedure Notes (Signed)
Procedure Name: Intubation Date/Time: 10/21/2011 2:28 PM Performed by: Norva Pavlov Pre-anesthesia Checklist: Patient identified, Emergency Drugs available, Suction available and Patient being monitored Patient Re-evaluated:Patient Re-evaluated prior to inductionOxygen Delivery Method: Circle System Utilized Preoxygenation: Pre-oxygenation with 100% oxygen Intubation Type: IV induction, Cricoid Pressure applied and Rapid sequence Ventilation: Mask ventilation without difficulty Laryngoscope Size: Mac and 3 Grade View: Grade I Tube type: Oral Tube size: 7.0 mm Number of attempts: 1 Airway Equipment and Method: stylet and oral airway Placement Confirmation: ETT inserted through vocal cords under direct vision,  positive ETCO2 and breath sounds checked- equal and bilateral Secured at: 20 cm Tube secured with: Tape Dental Injury: Teeth and Oropharynx as per pre-operative assessment

## 2011-10-21 NOTE — H&P (Signed)
eason For Visit  Rt flank pain   Active Problems Problems  1. Flank Pain Right 2. Nephrolithiasis 592.0  History of Present Illness         61 YO female presents today having Rt flank pain radiating to RLQ abdominal pain with a hx of kidney stones.  She states that pain started on Sunday, 10/19/11.  She has some nausea & feels a burning sensation in her Rt side.  She is s/p cysto/Rt RPG/Rt ureteroscopy/Holmium laser of Rt ureteral stone/basket extraction/Rt JJ stent on 08/12/11.      H/O UUI.  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 She has failed Vesicare 10 mg 1 po daily but feels no real improvement with Toviaz 8 mg 1 po daily.  She is s/p posterior pinnacle mesh sacrospinous repair of enterocele on 11/22/10. She is using Miralax for constipation with success. Rectal spasms have resolved.  Was originally referred by Dr. Sherron Monday for rectocele evaluation.  Para 3-3-0, with no tobacco. Since Easter she  had some vaginal bulging sensation. She feels some vibration in the vagina. It comes and goes. It is a daily issue. There is no other modifying factors or associated signs or symptoms. The symptoms are mild in severity but persistent. There is no other aggravating or relieving factors.    Dr. Sherron Monday did a vaginal vault suspension utilizing dermal graft plus enterocele repair and rectocele repair in March 2007. She had mild stress incontinence at the time wearing a light pad. She was sexually active but does not have any vaginal penetration. 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   Past Medical History Problems  1. History of  Anxiety (Symptom) 300.00 2. History of  Asthma 493.90 3. History of  Esophageal Reflux 530.81 4. History of  Hypercholesterolemia 272.0 5. History of  Murmurs 785.2  Surgical History Problems  1. History of  Bladder  Surgery 2. History of  Cesarean Section 3. History of  Cystoscopy With Insertion Of Ureteral Stent Right 4. History of  Cystoscopy With Ureteroscopy With Lithotripsy 5. History of  Hysterectomy V45.77 6. History of  Posterior Colporrhaphy (For Pelvic Relaxation) 7. History of  Sacrospinous Ligament Fixation For Posthysterectomy Prolapse 8. History of  Tubal Ligation V25.2 9. History of  Vaginal Surg Insertion Of Mesh For Pelvic Floor Repair  Current Meds 1. Aspirin 81 MG Oral Tablet; Therapy: (Recorded:09Oct2012) to 2. Clidinium-Chlordiazepoxide 2.5-5 MG Oral Capsule; Therapy: 10Nov2011 to 3. Dexilant 60 MG Oral Capsule Delayed Release; Therapy: 28Jan2011 to 4. Dulera AERO; Therapy: (Recorded:02May2012) to 5. Estradiol Cream 0.02%; Use 1ml syringe 3 x a week; Therapy: 09Oct2012 to (Last Rx:09Oct2012)   Requested for: 09Oct2012 6. Lipitor 20 MG Oral Tablet; Therapy: (Recorded:02May2012) to 7. LORazepam 0.5 MG Oral Tablet; Therapy: (Recorded:02May2012) to 8. Maxalt 10 MG Oral Tablet; Therapy: 23Apr2012 to 9. Methocarbamol 500 MG Oral Tablet; Therapy: 23Apr2012 to 10. MiraLax Oral Powder; Therapy: (Recorded:16Jul2012) to 11. Patanol 0.1 % Ophthalmic Solution; Therapy: 26Mar2012 to 12. ProAir HFA 108 (90 Base) MCG/ACT Inhalation Aerosol Solution; Therapy: 04Jan2013 to 13. Restasis 0.05 % Ophthalmic Emulsion; Therapy: 29Jan2013 to 14. Stool Softener CAPS; Therapy: (Recorded:02May2012) to 15. TiZANidine HCl 4 MG Oral Tablet; Therapy: 13Sep2011 to 16. ValACYclovir HCl 500 MG Oral Tablet; Therapy: 15Jun2012 to 17. Vitamin D (Ergocalciferol) 50000 UNIT Oral Capsule; Therapy: 10Apr2012 to 18. Vivelle-Dot 0.05 MG/24HR Transdermal Patch Biweekly; Therapy:  19Aug2011 to 19. Zonisamide 100 MG Oral Capsule; Therapy: 13Sep2011 to  Allergies Medication  1. Codeine Derivatives Non-Medication  2. Contrast Dye  Family History Problems  1. Paternal history of  Bladder Cancer V16.52 2. Maternal  history of  Blood In Urine 3. Paternal history of  Death In The Family Father father passed @ age 22staph infection 4. Maternal history of  Death In The Family Mother mother passed @ age 73brain bleed 5. Family history of  Family Health Status Number Of Children 1 son2 daughters 6. Maternal history of  Nephrolithiasis  Social History Problems  1. Alcohol Use 2 drinks per week 2. Caffeine Use no caffeine 3. Marital History - Single female partner 4. 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 History of  Tobacco Use  Review of Systems Genitourinary, constitutional, skin, eye, otolaryngeal, hematologic/lymphatic, cardiovascular, pulmonary, endocrine, musculoskeletal, gastrointestinal, neurological and psychiatric system(s) were reviewed and pertinent findings if present are noted.  Genitourinary: urinary frequency, urinary urgency, incontinence, hematuria and flank pain.  Gastrointestinal: nausea and abdominal pain.    Vitals Vital Signs [Data Includes: Last 1 Day]  30Apr2013 10:28AM  Blood Pressure: 146 / 83 Temperature: 97.5 F Heart Rate: 75  Physical Exam Constitutional: Well nourished and well developed . No acute distress.  ENT:. The ears and nose are normal in appearance.  Neck: The appearance of the neck is normal and no neck mass is present.  Pulmonary: No respiratory distress and normal respiratory rhythm and effort.  Cardiovascular:. No peripheral edema.  Abdomen: The abdomen is soft and nontender. No masses are palpated. Moderate tenderness in the RUQ is present and tenderness in the RLQ is present. moderate right CVA tenderness. No hernias are palpable. No hepatosplenomegaly noted.  Lymphatics: The femoral and inguinal nodes are not enlarged or tender.  Skin: Normal skin turgor, no visible rash and no visible skin lesions.  Neuro/Psych:. Mood and affect are appropriate.    Results/Data  Urine [Data Includes: Last 1 Day]   30Apr2013  COLOR  YELLOW   APPEARANCE CLEAR   SPECIFIC GRAVITY 1.010   pH 6.0   GLUCOSE NEG mg/dL  BILIRUBIN NEG   KETONE NEG mg/dL  BLOOD TRACE   PROTEIN NEG mg/dL  UROBILINOGEN 0.2 mg/dL  NITRITE NEG   LEUKOCYTE ESTERASE MOD   SQUAMOUS EPITHELIAL/HPF RARE   WBC 4-6 WBC/hpf  RBC 0-3 RBC/hpf  BACTERIA FEW   CRYSTALS Calcium Oxalate crystals noted   CASTS NONE SEEN    21 Oct 2011 10:10 AM   UA With REFLEX       COLOR YELLOW       APPEARANCE CLEAR       SPECIFIC GRAVITY 1.010       pH 6.0       GLUCOSE NEG       BILIRUBIN NEG       KETONE NEG       BLOOD TRACE       PROTEIN NEG       UROBILINOGEN 0.2       NITRITE NEG       LEUKOCYTE ESTERASE MOD       SQUAMOUS EPITHELIAL/HPF RARE       WBC 4-6       CRYSTALS Calcium Oxalate crystals noted       CASTS NONE SEEN       RBC 0-3       BACTERIA FEW   Assessment Assessed  1. Flank Pain Right 2. Acute Cystitis  595.0   61 yo female with hx of surgery for R ueteral stone, and known L stones, now with 3 days of R renal colic, nausea, but no vomiting, and no gross hematuria. No fever or chills. She has definite R flank and RLQ pain to light palpation, and possible early UTI. Will Rx with Toradol, and CT ( stone protocol).   Plan  Flank Pain Right, Flank Pain Quality Sharp, Ureteral Stone (592.1)  1. Ketorolac Tromethamine 60 MG/2ML Injection Solution; INJECT 60  MG Intramuscular; Done:  30Apr2013 11:01AM; Status: COMPLETE       CT stone protocol-stone in R lower ureter. She is miserable with her colic, and will proceed to R ureteroscopy and laser fx and removal of stone. ( last meal 8am).   Toradol 60mg  IM urine c/s. Probable antibiotic coverage.   UA With REFLEX  Status: Resulted - Requires Verification  Done: 01Jan0001 12:00AM Ordered Today; For: Health Maintenance (V70.0); Ordered By: Jethro Bolus  Due: 02May2013 Marked Important; Last Updated By: Nathaniel Man   Signatures Electronically signed by : Jethro Bolus, M.D.;  Oct 21 2011 12:23PM

## 2011-10-21 NOTE — Interval H&P Note (Signed)
History and Physical Interval Note:  10/21/2011 1:17 PM  Carol [name]  has presented today for surgery, with the diagnosis of RIGHT DISTAL URETERAL STONE  The various methods of treatment have been discussed with the patient and family. After consideration of risks, benefits and other options for treatment, the patient has consented to  Procedure(s) (LRB): CYSTOSCOPY/RETROGRADE/URETEROSCOPY/STONE EXTRACTION WITH BASKET (Right) as a surgical intervention .  The patients' history has been reviewed, patient examined, no change in status, stable for surgery.  I have reviewed the patients' chart and labs.  Questions were answered to the patient's satisfaction.     Jethro Bolus I

## 2011-10-21 NOTE — Transfer of Care (Signed)
Immediate Anesthesia Transfer of Care Note  Patient: Carol [name]  Procedure(s) Performed: Procedure(s) (LRB): CYSTOSCOPY/RETROGRADE/URETEROSCOPY/STONE EXTRACTION WITH BASKET (Right)  Patient Location: PACU  Anesthesia Type: General  Level of Consciousness: awake, alert  and oriented  Airway & Oxygen Therapy: Patient Spontanous Breathing and Patient connected to nasal cannula  Post-op Assessment: Report given to PACU RN and Post -op Vital signs reviewed and stable  Post vital signs: Reviewed and stable  Complications: No apparent anesthesia complications

## 2011-10-21 NOTE — Discharge Instructions (Addendum)
Kidney Stones Kidney stones (ureteral lithiasis) are deposits that form inside your kidneys. The intense pain is caused by the stone moving through the urinary tract. When the stone moves, the ureter goes into spasm around the stone. The stone is usually passed in the urine.  CAUSES   A disorder that makes certain neck glands produce too much parathyroid hormone (primary hyperparathyroidism).   A buildup of uric acid crystals.   Narrowing (stricture) of the ureter.   A kidney obstruction present at birth (congenital obstruction).   Previous surgery on the kidney or ureters.   Numerous kidney infections.  SYMPTOMS   Feeling sick to your stomach (nauseous).   Throwing up (vomiting).   Blood in the urine (hematuria).   Pain that usually spreads (radiates) to the groin.   Frequency or urgency of urination.  DIAGNOSIS   Taking a history and physical exam.   Blood or urine tests.   Computerized X-ray scan (CT scan).   Occasionally, an examination of the inside of the urinary bladder (cystoscopy) is performed.  TREATMENT   Observation.   Increasing your fluid intake.   Surgery may be needed if you have severe pain or persistent obstruction.  The size, location, and chemical composition are all important variables that will determine the proper choice of action for you. Talk to your caregiver to better understand your situation so that you will minimize the risk of injury to yourself and your kidney.  HOME CARE INSTRUCTIONS   Drink enough water and fluids to keep your urine clear or pale yellow.   Strain all urine through the provided strainer. Keep all particulate matter and stones for your caregiver to see. The stone causing the pain may be as small as a grain of salt. It is very important to use the strainer each and every time you pass your urine. The collection of your stone will allow your caregiver to analyze it and verify that a stone has actually passed.   Only take  over-the-counter or prescription medicines for pain, discomfort, or fever as directed by your caregiver.   Make a follow-up appointment with your caregiver as directed.   Get follow-up X-rays if required. The absence of pain does not always mean that the stone has passed. It may have only stopped moving. If the urine remains completely obstructed, it can cause loss of kidney function or even complete destruction of the kidney. It is your responsibility to make sure X-rays and follow-ups are completed. Ultrasounds of the kidney can show blockages and the status of the kidney. Ultrasounds are not associated with any radiation and can be performed easily in a matter of minutes.  SEEK IMMEDIATE MEDICAL CARE IF:   Pain cannot be controlled with the prescribed medicine.   You have a fever.   The severity or intensity of pain increases over 18 hours and is not relieved by pain medicine.   You develop a new onset of abdominal pain.   You feel faint or pass out.  MAKE SURE YOU:   Understand these instructions.   Will watch your condition.   Will get help right away if you are not doing well or get worse.  Document Released: 06/09/2005 Document Revised: 05/29/2011 Document Reviewed: 10/05/2009 [facility] Patient Information 2012 [place], [place].Ureteral Colic Ureteral colic is spasm-like pain from the kidney or the ureter. This is often caused by a kidney stone. The pain is caused by the stone trying to get through the tubes that pass your pee. HOME  CARE   Drink enough fluids to keep your pee (urine) clear or pale yellow.   Strain all your pee. A strainer will be provided. Keep anything caught in the strainer and bring it to your doctor. The stone causing the pain may be very small.   Only take medicine as told by your doctor.   Follow up with your doctor as told.  GET HELP RIGHT AWAY IF:   Pain is not controlled with medicine.   Pain continues or gets worse.   The pain changes and there  is chest or belly (abdominal) pain.   You pass out (faint).   You cannot pee.   You keep throwing up (vomiting).   You have a temperature by mouth above 102 F (38.9 C), not controlled by medicine.  MAKE SURE YOU:   Understand these instructions.   Will watch this condition.   Will get help right away if you are not doing well or get worse.  Document Released: 11/26/2007 Document Revised: 05/29/2011 Document Reviewed: 11/26/2007 [employer] Patient Information 2012 ExitCare, [place] Post Anesthesia Home Care Instructions  Activity: Get plenty of rest for the remainder of the day. A responsible adult should stay with you for 24 hours following the procedure.  For the next 24 hours, DO NOT: -Drive a car -Advertising copywriter -Drink alcoholic beverages -Take any medication unless instructed by your physician -Make any legal decisions or sign important papers.  Meals: Start with liquid foods such as gelatin or soup. Progress to regular foods as tolerated. Avoid greasy, spicy, heavy foods. If nausea and/or vomiting occur, drink only clear liquids until the nausea and/or vomiting subsides. Call your physician if vomiting continues.  Special Instructions/Symptoms: Your throat may feel dry or sore from the anesthesia or the breathing tube placed in your throat during surgery. If this causes discomfort, gargle with warm salt water. The discomfort should disappear within 24 hours.  C.

## 2011-10-22 ENCOUNTER — Encounter (HOSPITAL_BASED_OUTPATIENT_CLINIC_OR_DEPARTMENT_OTHER): Payer: Self-pay | Admitting: Urology

## 2011-10-22 NOTE — Anesthesia Postprocedure Evaluation (Signed)
  Anesthesia Post-op Note  Patient: Carol [name]  Procedure(s) Performed: Procedure(s) (LRB): CYSTOSCOPY/RETROGRADE/URETEROSCOPY/STONE EXTRACTION WITH BASKET (Right)  Patient Location: PACU  Anesthesia Type: General  Level of Consciousness: awake and alert   Airway and Oxygen Therapy: Patient Spontanous Breathing  Post-op Pain: mild  Post-op Assessment: Post-op Vital signs reviewed, Patient's Cardiovascular Status Stable, Respiratory Function Stable, Patent Airway and No signs of Nausea or vomiting  Post-op Vital Signs: stable  Complications: No apparent anesthesia complications

## 2011-11-18 ENCOUNTER — Encounter (HOSPITAL_COMMUNITY): Payer: Self-pay | Admitting: Anesthesiology

## 2011-11-18 ENCOUNTER — Encounter (HOSPITAL_COMMUNITY)
Admission: RE | Disposition: A | Discharge status: Home / Self Care | Payer: Self-pay | Source: Ambulatory Visit | Attending: Urology

## 2011-11-18 ENCOUNTER — Other Ambulatory Visit: Payer: Self-pay | Admitting: Urology

## 2011-11-18 ENCOUNTER — Ambulatory Visit (HOSPITAL_COMMUNITY)
Admission: RE | Admit: 2011-11-18 | Discharge: 2011-11-18 | Disposition: A | Discharge status: Home / Self Care | Payer: BC Managed Care – PPO | Source: Ambulatory Visit | Attending: Urology | Admitting: Urology

## 2011-11-18 ENCOUNTER — Encounter (HOSPITAL_COMMUNITY): Payer: Self-pay | Admitting: *Deleted

## 2011-11-18 DIAGNOSIS — Z7982 Long term (current) use of aspirin: Secondary | ICD-10-CM | POA: Insufficient documentation

## 2011-11-18 DIAGNOSIS — J45909 Unspecified asthma, uncomplicated: Secondary | ICD-10-CM | POA: Insufficient documentation

## 2011-11-18 DIAGNOSIS — K219 Gastro-esophageal reflux disease without esophagitis: Secondary | ICD-10-CM | POA: Insufficient documentation

## 2011-11-18 DIAGNOSIS — N1330 Unspecified hydronephrosis: Secondary | ICD-10-CM | POA: Insufficient documentation

## 2011-11-18 DIAGNOSIS — Z79899 Other long term (current) drug therapy: Secondary | ICD-10-CM | POA: Insufficient documentation

## 2011-11-18 DIAGNOSIS — N201 Calculus of ureter: Secondary | ICD-10-CM | POA: Insufficient documentation

## 2011-11-18 DIAGNOSIS — E7800 Pure hypercholesterolemia, unspecified: Secondary | ICD-10-CM | POA: Insufficient documentation

## 2011-11-18 HISTORY — PX: CYSTOSCOPY/RETROGRADE/URETEROSCOPY: SHX5316

## 2011-11-18 LAB — SURGICAL PCR SCREEN
MRSA, PCR: NEGATIVE
Staphylococcus aureus: NEGATIVE

## 2011-11-18 LAB — BASIC METABOLIC PANEL
BUN: 19 mg/dL (ref 6–23)
CO2: 24 mEq/L (ref 19–32)
Calcium: 9.1 mg/dL (ref 8.4–10.5)
Creatinine, Ser: 1.18 mg/dL — ABNORMAL HIGH (ref 0.50–1.10)
GFR calc Af Amer: 58 mL/min — ABNORMAL LOW (ref >90)

## 2011-11-18 SURGERY — CYSTOSCOPY/RETROGRADE/URETEROSCOPY
Anesthesia: General | Site: Ureter | Laterality: Right | Wound class: Clean Contaminated

## 2011-11-18 MED ORDER — STERILE WATER FOR IRRIGATION IR SOLN
Status: DC | PRN
  Administered 2011-11-18: 3000 mL

## 2011-11-18 MED ORDER — IOHEXOL 300 MG/ML  SOLN
INTRAMUSCULAR | Status: DC | PRN
  Administered 2011-11-18: 6 mL

## 2011-11-18 MED ORDER — CEFAZOLIN SODIUM 1-5 GM-% IV SOLN
INTRAVENOUS | Status: AC
  Filled 2011-11-18: qty 50

## 2011-11-18 MED ORDER — MORPHINE SULFATE 2 MG/ML IJ SOLN
2.0000 mg | INTRAMUSCULAR | Status: DC | PRN
  Administered 2011-11-18: 2 mg via INTRAVENOUS

## 2011-11-18 MED ORDER — KETOROLAC TROMETHAMINE 15 MG/ML IJ SOLN
INTRAMUSCULAR | Status: DC | PRN
  Administered 2011-11-18: 15 mg via INTRAVENOUS

## 2011-11-18 MED ORDER — MEPERIDINE HCL 50 MG/ML IJ SOLN: 6.2500 mg | INTRAMUSCULAR | Status: DC | PRN

## 2011-11-18 MED ORDER — MIDAZOLAM HCL 5 MG/5ML IJ SOLN
INTRAMUSCULAR | Status: DC | PRN
  Administered 2011-11-18: 2 mg via INTRAVENOUS

## 2011-11-18 MED ORDER — FENTANYL CITRATE 0.05 MG/ML IJ SOLN
INTRAMUSCULAR | Status: DC | PRN
  Administered 2011-11-18: 50 ug via INTRAVENOUS

## 2011-11-18 MED ORDER — ACETAMINOPHEN 10 MG/ML IV SOLN
INTRAVENOUS | Status: AC
  Filled 2011-11-18: qty 100

## 2011-11-18 MED ORDER — PROMETHAZINE HCL 25 MG/ML IJ SOLN: 6.2500 mg | INTRAMUSCULAR | Status: DC | PRN

## 2011-11-18 MED ORDER — MUPIROCIN 2 % EX OINT
TOPICAL_OINTMENT | CUTANEOUS | Status: AC
  Filled 2011-11-18: qty 22

## 2011-11-18 MED ORDER — BELLADONNA ALKALOIDS-OPIUM 16.2-60 MG RE SUPP
RECTAL | Status: DC | PRN
  Administered 2011-11-18: 1 via RECTAL

## 2011-11-18 MED ORDER — MORPHINE SULFATE 2 MG/ML IJ SOLN
INTRAMUSCULAR | Status: AC
  Filled 2011-11-18: qty 1

## 2011-11-18 MED ORDER — IOHEXOL 300 MG/ML  SOLN
INTRAMUSCULAR | Status: AC
  Filled 2011-11-18: qty 1

## 2011-11-18 MED ORDER — ONDANSETRON HCL 4 MG/2ML IJ SOLN
INTRAMUSCULAR | Status: DC | PRN
  Administered 2011-11-18: 4 mg via INTRAVENOUS

## 2011-11-18 MED ORDER — LIDOCAINE HCL (CARDIAC) 20 MG/ML IV SOLN
INTRAVENOUS | Status: DC | PRN
  Administered 2011-11-18: 70 mg via INTRAVENOUS

## 2011-11-18 MED ORDER — LACTATED RINGERS IV SOLN
INTRAVENOUS | Status: DC
  Administered 2012-09-10: 13:00:00 via INTRAVENOUS

## 2011-11-18 MED ORDER — PHENAZOPYRIDINE HCL 200 MG PO TABS
ORAL_TABLET | ORAL | Status: AC
  Filled 2011-11-18: qty 1

## 2011-11-18 MED ORDER — BELLADONNA ALKALOIDS-OPIUM 16.2-60 MG RE SUPP
RECTAL | Status: AC
  Filled 2011-11-18: qty 1

## 2011-11-18 MED ORDER — LACTATED RINGERS IV SOLN
INTRAVENOUS | Status: DC | PRN
  Administered 2011-11-18: 18:00:00 via INTRAVENOUS

## 2011-11-18 MED ORDER — PROPOFOL 10 MG/ML IV EMUL
INTRAVENOUS | Status: DC | PRN
  Administered 2011-11-18: 190 mg via INTRAVENOUS

## 2011-11-18 MED ORDER — PHENAZOPYRIDINE HCL 200 MG PO TABS: 200.0000 mg | ORAL_TABLET | Freq: Three times a day (TID) | ORAL | Status: AC | PRN

## 2011-11-18 MED ORDER — ACETAMINOPHEN 10 MG/ML IV SOLN
INTRAVENOUS | Status: DC | PRN
  Administered 2011-11-18: 1000 mg via INTRAVENOUS

## 2011-11-18 MED ORDER — TRAMADOL-ACETAMINOPHEN 37.5-325 MG PO TABS: 1.0000 | ORAL_TABLET | Freq: Four times a day (QID) | ORAL | Status: AC | PRN

## 2011-11-18 MED ORDER — CEFAZOLIN SODIUM 1-5 GM-% IV SOLN
INTRAVENOUS | Status: DC | PRN
  Administered 2011-11-18: 1 g via INTRAVENOUS

## 2011-11-18 MED ORDER — HYDROMORPHONE HCL PF 1 MG/ML IJ SOLN: 0.2500 mg | INTRAMUSCULAR | Status: DC | PRN

## 2011-11-18 SURGICAL SUPPLY — 21 items
ADAPTER CATH URET PLST 4-6FR (CATHETERS) ×3 IMPLANT
ADPR CATH URET STRL DISP 4-6FR (CATHETERS) ×2
BAG URO CATCHER STRL LF (DRAPE) ×3 IMPLANT
BASKET STNLS GEMINI 4WIRE 3FR (BASKET) ×2 IMPLANT
BSKT STON RTRVL GEM 120X11 3FR (BASKET) ×2
CATH INTERMIT  6FR 70CM (CATHETERS) ×3 IMPLANT
CATH URET 5FR 28IN OPEN ENDED (CATHETERS) IMPLANT
CLOTH BEACON ORANGE TIMEOUT ST (SAFETY) ×3 IMPLANT
DRAPE CAMERA CLOSED 9X96 (DRAPES) ×3 IMPLANT
GLOVE BIOGEL M STRL SZ7.5 (GLOVE) ×3 IMPLANT
GLOVE SURG SS PI 8.0 STRL IVOR (GLOVE) ×3 IMPLANT
GOWN PREVENTION PLUS XLARGE (GOWN DISPOSABLE) ×1 IMPLANT
GOWN STRL NON-REIN LRG LVL3 (GOWN DISPOSABLE) ×1 IMPLANT
GOWN STRL REIN XL XLG (GOWN DISPOSABLE) ×1 IMPLANT
GUIDEWIRE STR DUAL SENSOR (WIRE) ×3 IMPLANT
MANIFOLD NEPTUNE II (INSTRUMENTS) ×3 IMPLANT
MARKER SKIN DUAL TIP RULER LAB (MISCELLANEOUS) ×3 IMPLANT
NS IRRIG 1000ML POUR BTL (IV SOLUTION) ×3 IMPLANT
PACK CYSTO (CUSTOM PROCEDURE TRAY) ×3 IMPLANT
SCRUB PCMX 4 OZ (MISCELLANEOUS) IMPLANT
TUBING CONNECTING 10 (TUBING) ×3 IMPLANT

## 2011-11-18 NOTE — H&P (Signed)
y of Present Illness       61 YO female patient of Dr. Imelda Pillow seen today for acute evaluation of severe right flank pain with assiciated N/V.  The pain awoke her from sleep Sun. am 11/16/11.  The pain has persisted intermittently.  Currently, her pain is 8/10 in severity.  The pain remains confined to the right flank.  The pain is unchanged with position or activity.  The pain does not radiate into the RLQ abd, buttocks or LEs.  She denies gross hematuria, dysuria, fever or chills. The pain feels identical to that which she was having with the distal ureteral calculi.   She is s/p cystoureterscopy, R RPG, and stone extraction 10/21/11 for a residual right distal ureteral stone fragment s/p prior cystoureteroscopy, HLL and right distal ureteral stone extraction 08/12/11.  She was seen in the office 10/30/11 in follow up and was without complaints at that time.   Past Medical History Problems  1. History of  Anxiety (Symptom) 300.00 2. History of  Asthma 493.90 3. History of  Esophageal Reflux 530.81 4. History of  Hypercholesterolemia 272.0 5. History of  Murmurs 785.2  Surgical History Problems  1. History of  Bladder Surgery 2. History of  Cesarean Section 3. History of  Cystoscopy With Insertion Of Ureteral Stent Right 4. History of  Cystoscopy With Ureteroscopy With Lithotripsy 5. History of  Cystoscopy With Ureteroscopy With Removal Of Calculus 6. History of  Hysterectomy V45.77 7. History of  Posterior Colporrhaphy (For Pelvic Relaxation) 8. History of  Sacrospinous Ligament Fixation For Posthysterectomy Prolapse 9. History of  Tubal Ligation V25.2 10. History of  Vaginal Surg Insertion Of Mesh For Pelvic Floor Repair  Current Meds 1. Aspirin 81 MG Oral Tablet; Therapy: (Recorded:09Oct2012) to 2. Clidinium-Chlordiazepoxide 2.5-5 MG Oral Capsule; Therapy: 10Nov2011 to 3. Dexilant 60 MG Oral Capsule Delayed Release; Therapy: 28Jan2011 to 4. Dulera AERO; Therapy: (Recorded:02May2012)  to 5. Estradiol Cream 0.02%; Use 1ml syringe 3 x a week; Therapy: 09Oct2012 to (Last Rx:09Oct2012)   Requested for: 09Oct2012 6. Lipitor 20 MG Oral Tablet; Therapy: (Recorded:02May2012) to 7. LORazepam 0.5 MG Oral Tablet; Therapy: (Recorded:02May2012) to 8. Maxalt 10 MG Oral Tablet; Therapy: 23Apr2012 to 9. Methocarbamol 500 MG Oral Tablet; Therapy: 23Apr2012 to 10. MiraLax Oral Powder; Therapy: (Recorded:16Jul2012) to 11. Patanol 0.1 % Ophthalmic Solution; Therapy: 26Mar2012 to 12. ProAir HFA 108 (90 Base) MCG/ACT Inhalation Aerosol Solution; Therapy: 04Jan2013 to 13. Restasis 0.05 % Ophthalmic Emulsion; Therapy: 29Jan2013 to 14. Stool Softener CAPS; Therapy: (Recorded:02May2012) to 15. TiZANidine HCl 4 MG Oral Tablet; Therapy: 13Sep2011 to 16. ValACYclovir HCl 500 MG Oral Tablet; Therapy: 15Jun2012 to 17. Vitamin D (Ergocalciferol) 50000 UNIT Oral Capsule; Therapy: 10Apr2012 to 18. Vivelle-Dot 0.05 MG/24HR Transdermal Patch Biweekly; Therapy: 19Aug2011 to 19. Zonisamide 100 MG Oral Capsule; Therapy: 13Sep2011 to  Allergies Medication  1. Codeine Derivatives Non-Medication  2. Contrast Dye  Family History Problems  1. Paternal history of  Bladder Cancer V16.52 2. Maternal history of  Blood In Urine 3. Paternal history of  Death In The Family Father father passed @ age 7staph infection 4. Maternal history of  Death In The Family Mother mother passed @ age 73brain bleed 5. Family history of  Family Health Status Number Of Children 1 son2 daughters 6. Maternal history of  Nephrolithiasis  Social History Problems  1. Alcohol Use 2 drinks per week 2. Caffeine Use no caffeine 3. Marital History - Single female partner 4. Never A Smoker 5. Occupation: Copy  6. History of  Tobacco Use  Review of Systems Genitourinary, constitutional, skin, eye, otolaryngeal, hematologic/lymphatic, cardiovascular, pulmonary, endocrine, musculoskeletal,  gastrointestinal, neurological and psychiatric system(s) were reviewed and pertinent findings if present are noted.  Gastrointestinal: nausea, vomiting, flank pain and abdominal pain.    Vitals Vital Signs [Data Includes: Last 1 Day]  28May2013 10:39AM  Blood Pressure: 115 / 77 Temperature: 98.1 F Heart Rate: 83  Physical Exam Constitutional: Well nourished and well developed . No acute distress.  ENT:. The ears and nose are normal in appearance.  Neck: The appearance of the neck is normal and no neck mass is present.  Pulmonary: No respiratory distress and normal respiratory rhythm and effort.  Cardiovascular:. No peripheral edema.  Abdomen: moderate right CVA tenderness.  Skin: Normal skin turgor, no visible rash and no visible skin lesions.  Neuro/Psych:. Mood and affect are appropriate.    Results/Data Urine [Data Includes: Last 1 Day]   28May2013  COLOR ORANGE   APPEARANCE CLOUDY   SPECIFIC GRAVITY 1.025   pH 5.5   GLUCOSE NEG mg/dL  BILIRUBIN NEG   KETONE 15 mg/dL  BLOOD SMALL   PROTEIN 30 mg/dL  UROBILINOGEN 1 mg/dL  NITRITE POS   LEUKOCYTE ESTERASE SMALL   SQUAMOUS EPITHELIAL/HPF FEW   WBC 4-6 WBC/hpf  RBC 0-3 RBC/hpf  BACTERIA FEW   CRYSTALS Calcium Oxalate crystals noted   CASTS NONE SEEN    The following images/tracing/specimen were independently visualized: Marland Kitchen     KUB reveals a calcification over the expected course of the right distal ureter.  No other urolithiasis visible.  Diffuse overlying bowel gas and stool.  Pelvic phleboliths noted.  Right renal U/S demonstrates severe hydroureteronephrosis.  PVR is 2.46ml.  See tech sheet for full detail.  The following clinical lab reports were reviewed: Marland Kitchen  Urinalysis Selected Results  UA With REFLEX 28May2013 10:31AM Bruning, Ashlyn   Test Name Result Flag Reference  COLOR ORANGE A YELLOW  Biochemicals may be affected by the color of the urine.  APPEARANCE CLOUDY A CLEAR  SPECIFIC GRAVITY 1.025  1.005-1.030  pH  5.5  5.0-8.0  GLUCOSE NEG mg/dL  NEG  BILIRUBIN NEG  NEG  KETONE 15 mg/dL A NEG  BLOOD SMALL A NEG  PROTEIN 30 mg/dL A NEG  UROBILINOGEN 1 mg/dL  1.6-1.0  NITRITE POS A NEG  LEUKOCYTE ESTERASE SMALL A NEG  SQUAMOUS EPITHELIAL/HPF FEW  RARE  WBC 4-6 WBC/hpf A <4  RBC 0-3 RBC/hpf  <4  BACTERIA FEW A RARE  Microscopic exam performed on unconcentrated urine  Calcium Oxalate crystals noted  CASTS NONE SEEN  NONE SEEN   Assessment Assessed  1. Hydronephrosis On The Right 591 2. Diffuse Abdominal Pain 789.07 3. Ureteral Stone Right 592.1  Plan Diffuse Abdominal Pain (789.07)  1. KUB  Done: 28May2013 12:00AM 2. RENAL U/S RIGHT  Done: 28May2013 12:00AM Health Maintenance (V70.0)  3. UA With REFLEX  Done: 28May2013 10:31AM Hydronephrosis On The Right (591)  4. Ketorolac Tromethamine 60 MG/2ML Injection Solution; INJECT 60  MG Intramuscular; Done:  28May2013 10:59AM; Status: COMPLETE   Demerol 50mg  and Phenergan 25mg  IM at 1:25pm. Toradol 60mg  IM at 11:30am. Will proceed with urgent cystoureteroscopy, right RGP, possible HLL, stone extraction and placement of right DJ stent.  Risks and benefits discussed with pt and pt agrees to proceed.  Case reviewed with Dr. Patsi Sears who agrees with stated assessment and plan.  Dr. Patsi Sears will perform the procedure.   Signatures Electronically signed by : Lebron Quam  Fayrene Helper; Nov 18 2011  1:31PM

## 2011-11-18 NOTE — Preoperative (Signed)
Beta Blockers   Reason not to administer Beta Blockers:Not Applicable 

## 2011-11-18 NOTE — Transfer of Care (Signed)
Immediate Anesthesia Transfer of Care Note  Patient: Carol [name]  Procedure(s) Performed: Procedure(s) (LRB): CYSTOSCOPY/RETROGRADE/URETEROSCOPY (Right)  Patient Location: PACU  Anesthesia Type: General  Level of Consciousness: awake, alert , oriented and patient cooperative  Airway & Oxygen Therapy: Patient Spontanous Breathing and Patient connected to face mask oxygen  Post-op Assessment: Report given to PACU RN, Post -op Vital signs reviewed and stable and Patient moving all extremities  Post vital signs: Reviewed and stable  Complications: No apparent anesthesia complications

## 2011-11-18 NOTE — Anesthesia Preprocedure Evaluation (Addendum)
Anesthesia Evaluation  Patient identified by MRN, date of birth, ID band Patient awake    Reviewed: Allergy & Precautions, H&P , NPO status , Patient's Chart, lab work & pertinent test results  History of Anesthesia Complications (+) AWARENESS UNDER ANESTHESIANegative for: history of anesthetic complications  Airway Mallampati: II TM Distance: >3 FB Neck ROM: Full    Dental No notable dental hx.    Pulmonary asthma ,  No recent significant wheezing. Lungs clear. Exercise induced asthma. breath sounds clear to auscultation  Pulmonary exam normal       Cardiovascular negative cardio ROS  Rhythm:Regular Rate:Normal     Neuro/Psych  Headaches,  Neuromuscular disease negative psych ROS   GI/Hepatic Neg liver ROS, hiatal hernia, GERD-  Medicated and Controlled,  Endo/Other  negative endocrine ROS  Renal/GU negative Renal ROS  negative genitourinary   Musculoskeletal negative musculoskeletal ROS (+)   Abdominal   Peds negative pediatric ROS (+)  Hematology negative hematology ROS (+)   Anesthesia Other Findings   Reproductive/Obstetrics negative OB ROS                          Anesthesia Physical  Anesthesia Plan  ASA: II and Emergent  Anesthesia Plan: General   Post-op Pain Management:    Induction: Intravenous  Airway Management Planned:   Additional Equipment:   Intra-op Plan:   Post-operative Plan: Extubation in OR  Informed Consent: I have reviewed the patients History and Physical, chart, labs and discussed the procedure including the risks, benefits and alternatives for the proposed anesthesia with the patient or authorized representative who has indicated his/her understanding and acceptance.   Dental advisory given  Plan Discussed with: CRNA  Anesthesia Plan Comments: (NPO after 0830 (couple bites of cereal). Has been nauseated today.)        Anesthesia Quick  Evaluation

## 2011-11-18 NOTE — Anesthesia Postprocedure Evaluation (Signed)
  Anesthesia Post-op Note  Patient: Carol [name]  Procedure(s) Performed: Procedure(s) (LRB): CYSTOSCOPY/RETROGRADE/URETEROSCOPY (Right)  Patient Location: PACU  Anesthesia Type: General  Level of Consciousness: awake and alert   Airway and Oxygen Therapy: Patient Spontanous Breathing  Post-op Pain: mild  Post-op Assessment: Post-op Vital signs reviewed, Patient's Cardiovascular Status Stable, Respiratory Function Stable, Patent Airway and No signs of Nausea or vomiting  Post-op Vital Signs: stable  Complications: No apparent anesthesia complications

## 2011-11-18 NOTE — Discharge Instructions (Signed)
Oxalate Restricted Diet A low-oxalate diet consists of foods that are low in a naturally occurring compound called oxalate that is found in plants.  INDICATIONS FOR USE Your caregiver may ask you to follow a low-oxalate diet in order to reduce certain types of kidney stones.  GUIDELINES  Avoid high-oxalate foods listed below:  Grains: High-fiber or bran cereal, whole-wheat bread, grits, barley, buckwheat, graham crackers, amaranth, pretzels, and fruitcake.   Vegetables: Dried beans, wax beans, French fries, sweet potatoes, chives, dark leafy greens, eggplant, leaks, okra, parsley, rutabaga, tomato paste, watercress, and escarole.   Fruit: Dried apricots, red currants, figs, kiwi, plums, and rhubarb.   Meat and Meat Substitutes: All nuts and nut butters, sesame seeds, and tahini paste. Soybeans and foods made from soy (soyburger, miso).   Milk: Chocolate milk and soymilk.   Fats and Oils: None to avoid.   Condiments/Miscellaneous: Chocolate, carob, marmalade, poppy seeds, instant iced tea, and juice from high-oxalate fruits.  Document Released: 10/04/2010 Document Revised: 05/29/2011 Document Reviewed: 10/04/2010 ExitCare Patient Information 2012 [employer].Ureteral Colic (Kidney Stones) Ureteral colic is the result of a condition when kidney stones form inside the kidney. Once kidney stones are formed they may move into the tube that connects the kidney with the bladder (ureter). If this occurs, this condition may cause pain (colic) in the ureter.  CAUSES  Pain is caused by stone movement in the ureter and the obstruction caused by the stone. SYMPTOMS  The pain comes and goes as the ureter contracts around the stone. The pain is usually intense, sharp, and stabbing in character. The location of the pain may move as the stone moves through the ureter. When the stone is near the kidney the pain is usually located in the back and radiates to the belly (abdomen). When the stone is ready to  pass into the bladder the pain is often located in the lower abdomen on the side the stone is located. At this location, the symptoms may mimic those of a urinary tract infection with urinary frequency. Once the stone is located here it often passes into the bladder and the pain disappears completely. TREATMENT   Your caregiver will provide you with medicine for pain relief.   You may require specialized follow-up X-rays.   The absence of pain does not always mean that the stone has passed. It may have just stopped moving. If the urine remains completely obstructed, it can cause loss of kidney function or even complete destruction of the involved kidney. It is your responsibility and in your interest that X-rays and follow-ups as suggested by your caregiver are completed. Relief of pain without passage of the stone can be associated with severe damage to the kidney, including loss of kidney function on that side.   If your stone does not pass on its own, additional measures may be taken by your caregiver to ensure its removal.  HOME CARE INSTRUCTIONS   Increase your fluid intake. Water is the preferred fluid since juices containing vitamin C may acidify the urine making it less likely for certain stones (uric acid stones) to pass.   Strain all urine. A strainer will be provided. Keep all particulate matter or stones for your caregiver to inspect.   Take your pain medicine as directed.   Make a follow-up appointment with your caregiver as directed.   Remember that the goal is passage of your stone. The absence of pain does not mean the stone is gone. Follow your caregiver's instructions.     Only take over-the-counter or prescription medicines for pain, discomfort, or fever as directed by your caregiver.  SEEK MEDICAL CARE IF:   Pain cannot be controlled with the prescribed medicine.   You have a fever.   Pain continues for longer than your caregiver advises it should.   There is a change  in the pain, and you develop chest discomfort or constant abdominal pain.   You feel faint or pass out.  MAKE SURE YOU:   Understand these instructions.   Will watch your condition.   Will get help right away if you are not doing well or get worse.  Document Released: 03/19/2005 Document Revised: 05/29/2011 Document Reviewed: 12/04/2010 ExitCare Patient Information 2012 [employer]. 

## 2011-11-18 NOTE — Interval H&P Note (Signed)
History and Physical Interval Note:  11/18/2011 7:20 PM  Carol [name]  has presented today for surgery, with the diagnosis of stone Right ureter  The various methods of treatment have been discussed with the patient and family. After consideration of risks, benefits and other options for treatment, the patient has consented to  Procedure(s) (LRB): CYSTOSCOPY WITH RETROGRADE PYELOGRAM, URETEROSCOPY AND STENT PLACEMENT (Right) HOLMIUM LASER APPLICATION (Right) as a surgical intervention .  The patients' history has been reviewed, patient examined, no change in status, stable for surgery.  I have reviewed the patients' chart and labs.  Questions were answered to the patient's satisfaction.     Jethro Bolus I

## 2011-11-18 NOTE — Op Note (Signed)
Pre-operative diagnosis : Right distal ureteral stone  Postoperative diagnosis: same  Operation: Cysto, Right retrograde pyelogram with interpretation, irrigation of stone from ureteral orifice, ureteroscopy, passage of stone basket.  Surgeon:  Kathie Rhodes. Carol Sears, MD  First assistant:None  Anesthesia:  Gen LMA  Preparation: After appropriate premedication, the right arm was marked, and the patient was brought to the operating room, and placed on the operating table in the dorsal supine position where general LMA anesthesia was introduced. She was placed in the dorsal lithotomy position and the pubis was prepped with Betadine solution draped in usual fashion.  Review history: The patient is a 61 year old female, seen in the office today with acute onset right flank and right lower quadrant pain, nausea vomiting, with history of recurrent right ureteral calculi. The patient's pain and colic were only minimally treated with pain medication and Toradol. She now presents for cystoscopy retrograde PyeloGram possible double-J catheter, and stone extraction.  Statement of  Likelihood of Success: Excellent. TIME-OUT observed.:  Procedure: Inspection of the urethra appears normal in its external portion. Cystoscopy shows normal bladder neck. The trigone was normal, and inspection of the right ureteral orifice shows stones within the right ureteral orifice. Direct irrigation of the right ureteral orifice allow the stone material to flush free from the right orifice, and a "crumbled cookie" fashion. Clear reflux was seen from the left ureteral orifice. Right retrograde pyelogram was performed, which showed normal appearing ureter, slightly dilated in the intramural portion. Right ureteroscopy was accomplished, which showed no evidence of stone, and a patent ureter. A stone basket was passed into the proximal portion ureter, opened, and dragged distalward, but no stone fragments were identified. The stone fragments  of the bladder were photographed, but were too small to identify by bladder irrigation. Therefore, the patient was given IV Toradol at the end of procedure, and IV Tylenol at the beginning of the procedure. She was awakened, and taken to recovery room in good condition. It was felt that no double-J stent was necessary.

## 2011-11-18 NOTE — OR Nursing (Signed)
Confirmed with patient that only intravenous contrast dye causes allergy. Surgeon aware. Patients also states codeine gives her nausea and vomiting, not belladona and opium suppository. surgeon aware. Blima Rich RN

## 2011-11-21 ENCOUNTER — Encounter (HOSPITAL_COMMUNITY): Payer: Self-pay | Admitting: Urology

## 2012-01-26 ENCOUNTER — Other Ambulatory Visit: Payer: Self-pay | Admitting: Family Medicine

## 2012-02-04 ENCOUNTER — Other Ambulatory Visit: Payer: Self-pay | Admitting: Family Medicine

## 2012-02-04 NOTE — Telephone Encounter (Signed)
Is this ok?

## 2012-02-08 ENCOUNTER — Other Ambulatory Visit: Payer: Self-pay | Admitting: Family Medicine

## 2012-02-10 ENCOUNTER — Other Ambulatory Visit: Payer: Self-pay | Admitting: Family Medicine

## 2012-02-11 ENCOUNTER — Telehealth: Payer: Self-pay | Admitting: Internal Medicine

## 2012-02-11 MED ORDER — VALACYCLOVIR HCL 500 MG PO TABS: ORAL_TABLET | ORAL | Status: DC

## 2012-02-11 NOTE — Telephone Encounter (Signed)
Pt called and stated that when she called Monday that i told her her valtrex 500mg  was sent to [facility] [address] for the 2nd time. Pt called again today stating that the pharmacy has not yet received it and that she did not want it sent there and wanted it to be sent to [facility] so i resent it to [facility] while pt was on the phone with me. Pt does not want to deal with [facility] on [address] anymore and asked me to delete it out of the system.

## 2012-02-17 ENCOUNTER — Other Ambulatory Visit: Payer: Self-pay | Admitting: Obstetrics and Gynecology

## 2012-02-17 ENCOUNTER — Other Ambulatory Visit: Payer: Self-pay | Admitting: Obstetrics & Gynecology

## 2012-02-17 DIAGNOSIS — Z1231 Encounter for screening mammogram for malignant neoplasm of breast: Secondary | ICD-10-CM

## 2012-03-05 ENCOUNTER — Encounter (HOSPITAL_BASED_OUTPATIENT_CLINIC_OR_DEPARTMENT_OTHER): Payer: Self-pay | Admitting: Anesthesiology

## 2012-03-05 ENCOUNTER — Other Ambulatory Visit: Payer: Self-pay | Admitting: Urology

## 2012-03-05 ENCOUNTER — Ambulatory Visit (HOSPITAL_COMMUNITY): Payer: BC Managed Care – PPO

## 2012-03-05 ENCOUNTER — Ambulatory Visit (HOSPITAL_BASED_OUTPATIENT_CLINIC_OR_DEPARTMENT_OTHER)
Admission: RE | Admit: 2012-03-05 | Discharge: 2012-03-05 | Disposition: A | Discharge status: Home / Self Care | Payer: BC Managed Care – PPO | Source: Ambulatory Visit | Attending: Urology | Admitting: Urology

## 2012-03-05 ENCOUNTER — Encounter (HOSPITAL_BASED_OUTPATIENT_CLINIC_OR_DEPARTMENT_OTHER): Payer: Self-pay | Admitting: *Deleted

## 2012-03-05 ENCOUNTER — Encounter (HOSPITAL_BASED_OUTPATIENT_CLINIC_OR_DEPARTMENT_OTHER)
Admission: RE | Disposition: A | Discharge status: Home / Self Care | Payer: Self-pay | Source: Ambulatory Visit | Attending: Urology

## 2012-03-05 ENCOUNTER — Ambulatory Visit (HOSPITAL_BASED_OUTPATIENT_CLINIC_OR_DEPARTMENT_OTHER): Payer: BC Managed Care – PPO | Admitting: Anesthesiology

## 2012-03-05 DIAGNOSIS — R011 Cardiac murmur, unspecified: Secondary | ICD-10-CM | POA: Insufficient documentation

## 2012-03-05 DIAGNOSIS — Z79899 Other long term (current) drug therapy: Secondary | ICD-10-CM | POA: Insufficient documentation

## 2012-03-05 DIAGNOSIS — N201 Calculus of ureter: Secondary | ICD-10-CM | POA: Insufficient documentation

## 2012-03-05 DIAGNOSIS — J45909 Unspecified asthma, uncomplicated: Secondary | ICD-10-CM | POA: Insufficient documentation

## 2012-03-05 DIAGNOSIS — K219 Gastro-esophageal reflux disease without esophagitis: Secondary | ICD-10-CM | POA: Insufficient documentation

## 2012-03-05 DIAGNOSIS — N1330 Unspecified hydronephrosis: Secondary | ICD-10-CM | POA: Insufficient documentation

## 2012-03-05 DIAGNOSIS — E7800 Pure hypercholesterolemia, unspecified: Secondary | ICD-10-CM | POA: Insufficient documentation

## 2012-03-05 HISTORY — PX: CYSTOSCOPY W/ URETERAL STENT PLACEMENT: SHX1429

## 2012-03-05 SURGERY — CYSTOSCOPY, WITH RETROGRADE PYELOGRAM AND URETERAL STENT INSERTION
Anesthesia: General | Site: Ureter | Laterality: Right | Wound class: Clean Contaminated

## 2012-03-05 MED ORDER — MEPERIDINE HCL 25 MG/ML IJ SOLN: 6.2500 mg | INTRAMUSCULAR | Status: DC | PRN

## 2012-03-05 MED ORDER — HYDROMORPHONE HCL 4 MG PO TABS: 4.0000 mg | ORAL_TABLET | ORAL | Status: AC | PRN

## 2012-03-05 MED ORDER — GLYCOPYRROLATE 0.2 MG/ML IJ SOLN
INTRAMUSCULAR | Status: DC | PRN
  Administered 2012-03-05: 0.2 mg via INTRAVENOUS

## 2012-03-05 MED ORDER — IOHEXOL 350 MG/ML SOLN
INTRAVENOUS | Status: DC | PRN
  Administered 2012-03-05: 5 mL

## 2012-03-05 MED ORDER — PROPOFOL 10 MG/ML IV BOLUS
INTRAVENOUS | Status: DC | PRN
  Administered 2012-03-05: 200 mg via INTRAVENOUS

## 2012-03-05 MED ORDER — METOCLOPRAMIDE HCL 5 MG/ML IJ SOLN
INTRAMUSCULAR | Status: DC | PRN
  Administered 2012-03-05: 10 mg via INTRAVENOUS

## 2012-03-05 MED ORDER — FENTANYL CITRATE 0.05 MG/ML IJ SOLN
25.0000 ug | Freq: Once | INTRAMUSCULAR | Status: AC
  Administered 2012-03-05: 100 ug via INTRAVENOUS

## 2012-03-05 MED ORDER — KETOROLAC TROMETHAMINE 30 MG/ML IJ SOLN
INTRAMUSCULAR | Status: DC | PRN
  Administered 2012-03-05: 30 mg via INTRAVENOUS

## 2012-03-05 MED ORDER — LACTATED RINGERS IV SOLN
INTRAVENOUS | Status: DC | PRN
  Administered 2012-03-05: 13:00:00 via INTRAVENOUS

## 2012-03-05 MED ORDER — ONDANSETRON HCL 4 MG/2ML IJ SOLN
INTRAMUSCULAR | Status: DC | PRN
  Administered 2012-03-05: 4 mg via INTRAVENOUS

## 2012-03-05 MED ORDER — CEFAZOLIN SODIUM 1-5 GM-% IV SOLN: 1.0000 g | INTRAVENOUS | Status: DC

## 2012-03-05 MED ORDER — FENTANYL CITRATE 0.05 MG/ML IJ SOLN: 25.0000 ug | INTRAMUSCULAR | Status: DC | PRN

## 2012-03-05 MED ORDER — LACTATED RINGERS IV SOLN
INTRAVENOUS | Status: DC
  Administered 2012-03-05: 16:00:00 via INTRAVENOUS

## 2012-03-05 MED ORDER — CEFAZOLIN SODIUM-DEXTROSE 2-3 GM-% IV SOLR: 2.0000 g | INTRAVENOUS | Status: DC

## 2012-03-05 MED ORDER — FENTANYL CITRATE 0.05 MG/ML IJ SOLN
INTRAMUSCULAR | Status: DC | PRN
  Administered 2012-03-05 (×2): 50 ug via INTRAVENOUS
  Administered 2012-03-05 (×2): 25 ug via INTRAVENOUS
  Administered 2012-03-05 (×3): 50 ug via INTRAVENOUS

## 2012-03-05 MED ORDER — STERILE WATER FOR IRRIGATION IR SOLN
Status: DC | PRN
  Administered 2012-03-05: 1 via INTRAVESICAL

## 2012-03-05 MED ORDER — DEXAMETHASONE SODIUM PHOSPHATE 4 MG/ML IJ SOLN
INTRAMUSCULAR | Status: DC | PRN
  Administered 2012-03-05: 10 mg via INTRAVENOUS

## 2012-03-05 MED ORDER — PROMETHAZINE HCL 25 MG/ML IJ SOLN
12.5000 mg | Freq: Once | INTRAMUSCULAR | Status: AC
  Administered 2012-03-05: 25 mg via INTRAVENOUS

## 2012-03-05 MED ORDER — MIDAZOLAM HCL 5 MG/5ML IJ SOLN: INTRAMUSCULAR | Status: DC | PRN

## 2012-03-05 MED ORDER — LIDOCAINE HCL (CARDIAC) 20 MG/ML IV SOLN
INTRAVENOUS | Status: DC | PRN
  Administered 2012-03-05: 100 mg via INTRAVENOUS

## 2012-03-05 MED ORDER — MIDAZOLAM HCL 5 MG/5ML IJ SOLN
INTRAMUSCULAR | Status: DC | PRN
  Administered 2012-03-05: 2 mg via INTRAVENOUS

## 2012-03-05 MED ORDER — LACTATED RINGERS IV SOLN
INTRAVENOUS | Status: DC
  Administered 2012-03-05: 13:00:00 via INTRAVENOUS

## 2012-03-05 MED ORDER — CEFAZOLIN SODIUM-DEXTROSE 2-3 GM-% IV SOLR
INTRAVENOUS | Status: DC | PRN
  Administered 2012-03-05: 2 g via INTRAVENOUS

## 2012-03-05 MED ORDER — PROMETHAZINE HCL 25 MG/ML IJ SOLN: 6.2500 mg | INTRAMUSCULAR | Status: DC | PRN

## 2012-03-05 SURGICAL SUPPLY — 21 items
ADAPTER CATH URET PLST 4-6FR (CATHETERS) ×2 IMPLANT
ADPR CATH URET STRL DISP 4-6FR (CATHETERS) ×2
BAG DRAIN URO-CYSTO SKYTR STRL (DRAIN) ×3 IMPLANT
BAG DRN UROCATH (DRAIN) ×2
CANISTER SUCT LVC 12 LTR MEDI- (MISCELLANEOUS) ×2 IMPLANT
CATH INTERMIT  6FR 70CM (CATHETERS) ×2 IMPLANT
CATH URET 5FR 28IN CONE TIP (BALLOONS) ×1
CATH URET 5FR 70CM CONE TIP (BALLOONS) ×1 IMPLANT
CLOTH BEACON ORANGE TIMEOUT ST (SAFETY) ×3 IMPLANT
DRAPE CAMERA CLOSED 9X96 (DRAPES) ×2 IMPLANT
GLOVE BIO SURGEON STRL SZ 6.5 (GLOVE) ×2 IMPLANT
GLOVE BIO SURGEON STRL SZ7 (GLOVE) ×3 IMPLANT
GLOVE BIO SURGEON STRL SZ7.5 (GLOVE) ×2 IMPLANT
GLOVE ECLIPSE 6.0 STRL STRAW (GLOVE) ×2 IMPLANT
GOWN PREVENTION PLUS LG XLONG (DISPOSABLE) ×4 IMPLANT
GUIDEWIRE 0.038 PTFE COATED (WIRE) ×1 IMPLANT
GUIDEWIRE ANG ZIPWIRE 038X150 (WIRE) IMPLANT
GUIDEWIRE STR DUAL SENSOR (WIRE) ×2 IMPLANT
PACK CYSTOSCOPY (CUSTOM PROCEDURE TRAY) ×3 IMPLANT
STENT POLARIS LOOP 8FR X 24 CM (STENTS) ×2 IMPLANT
WATER STERILE IRR 3000ML UROMA (IV SOLUTION) ×2 IMPLANT

## 2012-03-05 NOTE — Transfer of Care (Signed)
Immediate Anesthesia Transfer of Care Note  Patient: Carol [name]  Procedure(s) Performed: Procedure(s) (LRB): CYSTOSCOPY WITH RETROGRADE PYELOGRAM/URETERAL STENT PLACEMENT (Right)  Patient Location: PACU  Anesthesia Type: General  Level of Consciousness: awake, sedated, patient cooperative and responds to stimulation  Airway & Oxygen Therapy: Patient Spontanous Breathing and Patient connected to face mask oxygen  Post-op Assessment: Report given to PACU RN, Post -op Vital signs reviewed and stable and Patient moving all extremities  Post vital signs: Reviewed and stable  Complications: No apparent anesthesia complications

## 2012-03-05 NOTE — H&P (Signed)
Reason For Visit  Acute evaluation of right flank pain   History of Present Illness        Acute onset of severe right flank pain with associated nausea at 2am this morning.  She had been feeling well prior to this.  The pain radiates into the RLQ abdomen.  She denies dysuria, gross hematuria, fever or chills. The pain is unchanged with position or activity.  The pain does not radiate into the LEs or buttocks.  She has not had recent injury/trauma.  She had recently completed abx (Tues. 03/02/12) given by her GYN for culture proven UTI. Prior to that, we had treated her for culture proven Enterococcus UTI 01/28/12.      Urologic Hx: She was treated for Klebsiella UTI 12/17/11 with associated gross hematuria at that time.  She completed Cipro 500mg  po BID x10 days and sxs resolved.  Repeat urine C&S 12/24/11 returned no growth.  She was started on TMP 100mg  po qd suppression therafter and underwent cystoscopy and vaginal exam in the office 12/29/11 both of which failed to demonstrate any acute findings or source of recent UTI and gross hematuria.  .  She is s/p recent cystoureteroscopy for extraction of retained right distal ureteral stone fragments 11/18/11.  The stone fragments were removed at the time of surgery.  She had cystoureterscopy, R RPG, and stone extraction 10/21/11 for a residual right distal ureteral stone fragment s/p prior cystoureteroscopy, HLL and right distal ureteral stone extraction 08/12/11.  She was seen in the office 10/30/11 in follow up and was without complaints at that time.  She then presented back to the office 11/18/11 for acute evaluation of severe right flank pain with assiciated N/V.  The pain awoke her from sleep Sun. am 11/16/11 and had persisted intermittently.  The pain felt identical to that which she was having with the distal ureteral calculi.  KUB at that time demonstrated a calcification over the expected course of the right distal ureter and renal U/S showed severe  hydroureteronephrosis.  She has remaining small nonobstructing left renal calculi as documented on CTU 10/21/11.   Past Medical History Problems  1. History of  Anxiety (Symptom) 300.00 2. History of  Asthma 493.90 3. History of  Esophageal Reflux 530.81 4. History of  Hypercholesterolemia 272.0 5. History of  Murmurs 785.2  Surgical History Problems  1. History of  Bladder Surgery 2. History of  Cesarean Section 3. History of  Cystoscopy With Insertion Of Ureteral Stent Right 4. History of  Cystoscopy With Ureteroscopy With Lithotripsy 5. History of  Cystoscopy With Ureteroscopy With Removal Of Calculus 6. History of  Cystoscopy With Ureteroscopy With Removal Of Calculus 7. History of  Hysterectomy V45.77 8. History of  Posterior Colporrhaphy (For Pelvic Relaxation) 9. History of  Sacrospinous Ligament Fixation For Posthysterectomy Prolapse 10. History of  Tubal Ligation V25.2 11. History of  Vaginal Surg Insertion Of Mesh For Pelvic Floor Repair  Current Meds 1. Aspirin 81 MG Oral Tablet; Therapy: (Recorded:09Oct2012) to 2. Clidinium-Chlordiazepoxide 2.5-5 MG Oral Capsule; Therapy: 10Nov2011 to 3. Dexilant 60 MG Oral Capsule Delayed Release; Therapy: 28Jan2011 to 4. Dulera AERO; Therapy: (Recorded:02May2012) to 5. Estradiol Cream 0.02%; Use 1ml syringe 3 x a week; Therapy: 09Oct2012 to (Last Rx:09Oct2012)   Requested for: 09Oct2012 6. Lipitor 20 MG Oral Tablet; Therapy: (Recorded:02May2012) to 7. LORazepam 0.5 MG Oral Tablet; Therapy: (Recorded:02May2012) to 8. Maxalt 10 MG Oral Tablet; Therapy: 23Apr2012 to 9. Methocarbamol 500 MG Oral Tablet; Therapy: 23Apr2012 to 10.  MiraLax Oral Powder; Therapy: (Recorded:16Jul2012) to 11. Nitrofurantoin Macrocrystal 100 MG Oral Capsule; TAKE 1 CAPSULE BY MOUTH EVERY 12   HOURS; Therapy: 12Aug2013 to (Evaluate:22Aug2013)  Requested for: 12Aug2013; Last   Rx:12Aug2013 12. Patanol 0.1 % Ophthalmic Solution; Therapy: 26Mar2012 to 13.  Phenazopyridine HCl 200 MG Oral Tablet; TAKE 1 TABLET Every 6 hours as needed for bladder   pain, frequency, urgency; Therapy: 07Aug2013 to (Last Rx:07Aug2013)  Requested for:   07Aug2013 14. ProAir HFA 108 (90 Base) MCG/ACT Inhalation Aerosol Solution; Therapy: 04Jan2013 to 15. Restasis 0.05 % Ophthalmic Emulsion; Therapy: 29Jan2013 to 16. Stool Softener CAPS; Therapy: (Recorded:02May2012) to 17. TiZANidine HCl 4 MG Oral Tablet; Therapy: 13Sep2011 to 18. TraMADol HCl 50 MG Oral Tablet; TAKE 1 TABLET Every 6 hours PRN; Therapy: 26Jun2013 to   (Last Rx:26Jun2013)  Requested for: 26Jun2013 19. Trimethoprim 100 MG Oral Tablet; Take 1 tablet every day; Therapy: 03Jul2013 to   (Evaluate:29Jan2014)  Requested for: 03Jul2013; Last Rx:03Jul2013 20. ValACYclovir HCl 500 MG Oral Tablet; Therapy: 15Jun2012 to 21. Vitamin D (Ergocalciferol) 50000 UNIT Oral Capsule; Therapy: 10Apr2012 to 22. Vivelle-Dot 0.05 MG/24HR Transdermal Patch Biweekly; Therapy: 19Aug2011 to 23. Zonisamide 100 MG Oral Capsule; Therapy: 13Sep2011 to  Allergies Medication  1. Codeine Derivatives Non-Medication  2. Contrast Dye  Family History Problems  1. Paternal history of  Bladder Cancer V16.52 2. Maternal history of  Blood In Urine 3. Paternal history of  Death In The Family Father father passed @ age 90staph infection 4. Maternal history of  Death In The Family Mother mother passed @ age 73brain bleed 5. Family history of  Family Health Status Number Of Children 1 son2 daughters 6. Maternal history of  Nephrolithiasis  Social History Problems  1. Alcohol Use 2 drinks per week 2. Caffeine Use no caffeine 3. Marital History - Single female partner 4. 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 History of  Tobacco Use  Review of Systems Genitourinary, constitutional, skin, eye, otolaryngeal, hematologic/lymphatic, cardiovascular, pulmonary, endocrine, musculoskeletal, gastrointestinal,  neurological and psychiatric system(s) were reviewed and pertinent findings if present are noted.  Gastrointestinal: nausea, flank pain and abdominal pain.    Vitals Vital Signs [Data Includes: Last 1 Day]  13Sep2013 09:36AM  Blood Pressure: 129 / 76 Temperature: 97 F Heart Rate: 63  Physical Exam Constitutional: Well nourished and well developed . No acute distress.  ENT:. The ears and nose are normal in appearance.  Neck: The appearance of the neck is normal and no neck mass is present.  Pulmonary: No respiratory distress and normal respiratory rhythm and effort.  Cardiovascular:. No peripheral edema.  Abdomen: moderate right CVA tenderness.  Skin: Normal skin turgor, no visible rash and no visible skin lesions.  Neuro/Psych:. Mood and affect are appropriate.    Results/Data Urine [Data Includes: Last 1 Day]   13Sep2013  COLOR YELLOW   APPEARANCE CLOUDY   SPECIFIC GRAVITY 1.015   pH 6.5   GLUCOSE NEG mg/dL  BILIRUBIN NEG   KETONE NEG mg/dL  BLOOD TRACE   PROTEIN NEG mg/dL  UROBILINOGEN 0.2 mg/dL  NITRITE NEG   LEUKOCYTE ESTERASE MOD   SQUAMOUS EPITHELIAL/HPF FEW   WBC 3-6 WBC/hpf  RBC 3-6 RBC/hpf  BACTERIA FEW   CRYSTALS Calcium Oxalate crystals noted   CASTS NONE SEEN   Other Amorphous noted    The following images/tracing/specimen were independently visualized: .  CTU demonstrates severe right hydroureteronephrosis secondary to an 8mm stone in a dilated renal pelvis vs UPJ.  Small nonobstructing  stone in the left kidney.  The following clinical lab reports were reviewed: Marland Kitchen  Urinalysis Selected Results  UA With REFLEX 13Sep2013 09:16AM [name], Carol   Test Name Result Flag Reference  COLOR YELLOW  YELLOW  APPEARANCE CLOUDY A CLEAR  SPECIFIC GRAVITY 1.015  1.005-1.030  pH 6.5  5.0-8.0  GLUCOSE NEG mg/dL  NEG  BILIRUBIN NEG  NEG  KETONE NEG mg/dL  NEG  BLOOD TRACE A NEG  PROTEIN NEG mg/dL  NEG  UROBILINOGEN 0.2 mg/dL  1.6-1.0  NITRITE NEG  NEG  LEUKOCYTE  ESTERASE MOD A NEG  SQUAMOUS EPITHELIAL/HPF FEW  RARE  WBC 3-6 WBC/hpf A <3  RBC 3-6 RBC/hpf A <3  BACTERIA FEW A RARE  Calcium Oxalate crystals noted  CASTS NONE SEEN  NONE SEEN  Other Amorphous noted     Assessment Assessed  1. Diffuse Abdominal Pain 789.07 2. Nephrolithiasis 592.0 3. Proximal Ureteral Stone On The Right 592.1  Plan Diffuse Abdominal Pain (789.07)  1. Ketorolac Tromethamine 60 MG/2ML Injection Solution; INJECT 60  MG Intramuscular; Done:  13Sep2013 10:11AM; Status: COMPLETE Health Maintenance (V70.0)  2. UA With REFLEX  Done: 13Sep2013 09:16AM Nephrolithiasis (592.0)  3. AU CT-STONE PROTOCOL  Done: 13Sep2013 12:00AM

## 2012-03-05 NOTE — Op Note (Signed)
Carol [name] is a 61 y.o.   03/05/2012  General  Pre-op diagnosis: Severe right hydronephrosis, right UPJ calculus  Postop diagnosis: Same  Procedure done: Cystoscopy, right retrograde pyelogram, insertion of double-J stent  Surgeon: Wendie Simmer. [name]  Anesthesia: General  Indication: Patient is a 61 years old female who presented to the office today with sudden onset of severe right flank pain. She has a past history of kidney stone and is known to have a right hydroureteronephrosis. CT scan today showed an 11 x 8 mm stone in the right renal pelvis with severe hydroureteronephrosis. She is scheduled for cystoscopy, right retrograde pyelogram and insertion of double-J stent.  Procedure: Patient was identified by her wrist band and proper timeout was taken.  Under general anesthesia patient was prepped and draped and placed in the dorsolithotomy position. A panendoscope was inserted in the bladder. There were several debris material in the bladder. There is no stone or tumor in the bladder. The ureteral orifices are in normal position and shape.  Retrograde pyelogram:  A cone-tip catheter was passed through the cystoscope and the right ureteral orifice. 10 cc of Omnipaque were then instilled through the catheter. The distal ureter is markedly dilated. And contrast would not fill the mid ureter. I removed the the cone-tip catheter and passed a #6 [place] open-ended ureteral catheter in the ureter. Contrast was again injected through the open-ended catheter and contrast was injected in the mid and proximal ureter that are markedly dilated. There is no evidence of filling defect in the ureter.  A sensor wire was then passed through the cystoscope and the right ureter up into the renal pelvis. A #8 French-[id number] Polaris stent was passed over the sensor wire. The proximal curl of the double-J stent is in the renal pelvis and the loops of the stent are in the bladder. The sensor wire was removed.  The  bladder was then emptied and the cystoscope removed.  Patient tolerated the procedure well and left the OR in satisfactory condition to postanesthesia care unit.

## 2012-03-05 NOTE — Anesthesia Preprocedure Evaluation (Addendum)
Anesthesia Evaluation  Patient identified by MRN, date of birth, ID band Patient awake    Reviewed: Allergy & Precautions, H&P , NPO status , Patient's Chart, lab work & pertinent test results  History of Anesthesia Complications Negative for: history of anesthetic complications  Airway Mallampati: II TM Distance: >3 FB Neck ROM: Full    Dental No notable dental hx.    Pulmonary asthma ,  No recent significant wheezing. Lungs clear. Exercise induced asthma. breath sounds clear to auscultation  Pulmonary exam normal       Cardiovascular negative cardio ROS  Rhythm:Regular Rate:Normal     Neuro/Psych  Headaches,  Neuromuscular disease negative psych ROS   GI/Hepatic Neg liver ROS, hiatal hernia, GERD-  Medicated and Controlled,  Endo/Other  negative endocrine ROS  Renal/GU negative Renal ROS  negative genitourinary   Musculoskeletal negative musculoskeletal ROS (+)   Abdominal   Peds negative pediatric ROS (+)  Hematology negative hematology ROS (+)   Anesthesia Other Findings   Reproductive/Obstetrics negative OB ROS                           Anesthesia Physical  Anesthesia Plan  ASA: II and Emergent  Anesthesia Plan: General   Post-op Pain Management:    Induction: Intravenous  Airway Management Planned: LMA  Additional Equipment:   Intra-op Plan:   Post-operative Plan: Extubation in OR  Informed Consent: I have reviewed the patients History and Physical, chart, labs and discussed the procedure including the risks, benefits and alternatives for the proposed anesthesia with the patient or authorized representative who has indicated his/her understanding and acceptance.   Dental advisory given  Plan Discussed with: CRNA  Anesthesia Plan Comments: (NPO after 0830 (couple bites of cereal). Has been nauseated today.)       Anesthesia Quick Evaluation

## 2012-03-05 NOTE — Anesthesia Postprocedure Evaluation (Signed)
Immediate Anesthesia Transfer of Care Note  Patient: Carol [name]  Procedure(s) Performed: Procedure(s) (LRB): CYSTOSCOPY WITH RETROGRADE PYELOGRAM/URETERAL STENT PLACEMENT (Right)  Patient Location: PACU  Anesthesia Type: General  Level of Consciousness: awake, sedated, patient cooperative and responds to stimulation  Airway & Oxygen Therapy: Patient Spontanous Breathing and Patient connected to face mask oxygen  Post-op Assessment: Report given to PACU RN, Post -op Vital signs reviewed and stable and Patient moving all extremities  Post vital signs: Reviewed and stable  Complications: No apparent anesthesia complications    Immediate Anesthesia Transfer of Care Note  Patient: Carol [name]  Procedure(s) Performed: Procedure(s) (LRB): CYSTOSCOPY WITH RETROGRADE PYELOGRAM/URETERAL STENT PLACEMENT (Right)  Patient Location: PACU  Anesthesia Type: General  Level of Consciousness: awake, sedated, patient cooperative and responds to stimulation  Airway & Oxygen Therapy: Patient Spontanous Breathing and Patient connected to face mask oxygen  Post-op Assessment: Report given to PACU RN, Post -op Vital signs reviewed and stable and Patient moving all extremities  Post vital signs: Reviewed and stable  Complications: No apparent anesthesia complications

## 2012-03-05 NOTE — Transfer of Care (Signed)
Immediate Anesthesia Transfer of Care Note  Patient: Carol [name]  Procedure(s) Performed: Procedure(s) (LRB): CYSTOSCOPY WITH RETROGRADE PYELOGRAM/URETERAL STENT PLACEMENT (Right)  Patient Location: PACU  Anesthesia Type: General  Level of Consciousness: awake, sedated, patient cooperative and responds to stimulation  Airway & Oxygen Therapy: Patient Spontanous Breathing and Patient connected to face mask oxygen  Post-op Assessment: Report given to PACU RN, Post -op Vital signs reviewed and stable and Patient moving all extremities  Post vital signs: Reviewed and stable  Complications: No apparent anesthesia complications  

## 2012-03-05 NOTE — Anesthesia Procedure Notes (Signed)
Procedure Name: LMA Insertion Date/Time: 03/05/2012 2:03 PM Performed by: Jessica Priest Pre-anesthesia Checklist: Patient identified, Emergency Drugs available, Suction available and Patient being monitored Patient Re-evaluated:Patient Re-evaluated prior to inductionOxygen Delivery Method: Circle System Utilized Preoxygenation: Pre-oxygenation with 100% oxygen Intubation Type: IV induction Ventilation: Mask ventilation without difficulty LMA: LMA inserted LMA Size: 4.0 Number of attempts: 1 Airway Equipment and Method: bite block Placement Confirmation: positive ETCO2 Tube secured with: Tape Dental Injury: Teeth and Oropharynx as per pre-operative assessment

## 2012-03-05 NOTE — Anesthesia Postprocedure Evaluation (Signed)
  Anesthesia Post-op Note  Patient: Carol [name]  Procedure(s) Performed: Procedure(s) (LRB): CYSTOSCOPY WITH RETROGRADE PYELOGRAM/URETERAL STENT PLACEMENT (Right)  Patient Location: PACU  Anesthesia Type: General  Level of Consciousness: awake and alert   Airway and Oxygen Therapy: Patient Spontanous Breathing  Post-op Pain: mild  Post-op Assessment: Post-op Vital signs reviewed, Patient's Cardiovascular Status Stable, Respiratory Function Stable, Patent Airway and No signs of Nausea or vomiting  Post-op Vital Signs: stable  Complications: No apparent anesthesia complications

## 2012-03-07 ENCOUNTER — Other Ambulatory Visit: Payer: Self-pay | Admitting: Family Medicine

## 2012-03-08 ENCOUNTER — Encounter (HOSPITAL_BASED_OUTPATIENT_CLINIC_OR_DEPARTMENT_OTHER): Payer: Self-pay | Admitting: Urology

## 2012-03-08 LAB — POCT I-STAT 4, (NA,K, GLUC, HGB,HCT): Glucose, Bld: 108 mg/dL — ABNORMAL HIGH (ref 70–99)

## 2012-03-09 ENCOUNTER — Other Ambulatory Visit: Payer: Self-pay | Admitting: Urology

## 2012-03-11 ENCOUNTER — Encounter (HOSPITAL_COMMUNITY): Payer: Self-pay | Admitting: *Deleted

## 2012-03-11 NOTE — Pre-Procedure Instructions (Signed)
Asked to bring blue folder the day of the procedure,insurance card,I.D. driver's license,wear comfortable clothing and have a driver for the day. Asked not to take Advil,Motrin,Ibuprofen,Aleve or any NSAIDS, Aspirin, or Toradol for 72 hours prior to procedure,  No vitamins or herbal medications 7 days prior to procedure. Stopping vitamin today and herbal tea. Instructed to take laxative per doctor's office instructions and eat a light dinner the evening before procedure.   To arrive at 0915 for lithotripsy procedure.

## 2012-03-12 ENCOUNTER — Encounter (HOSPITAL_COMMUNITY): Payer: Self-pay | Admitting: Pharmacy Technician

## 2012-03-14 NOTE — H&P (Signed)
Acute evaluation of right flank pain   History of Present Illness        Acute onset of severe right flank pain with associated nausea at 2am this morning.  She had been feeling well prior to this.  The pain radiates into the RLQ abdomen.  She denies dysuria, gross hematuria, fever or chills. The pain is unchanged with position or activity.  The pain does not radiate into the LEs or buttocks.  She has not had recent injury/trauma.  She had recently completed abx (Tues. 03/02/12) given by her GYN for culture proven UTI. Prior to that, we had treated her for culture proven Enterococcus UTI 01/28/12.      Urologic Hx: She was treated for Klebsiella UTI 12/17/11 with associated gross hematuria at that time.  She completed Cipro 500mg  po BID x10 days and sxs resolved.  Repeat urine C&S 12/24/11 returned no growth.  She was started on TMP 100mg  po qd suppression therafter and underwent cystoscopy and vaginal exam in the office 12/29/11 both of which failed to demonstrate any acute findings or source of recent UTI and gross hematuria.  .  She is s/p recent cystoureteroscopy for extraction of retained right distal ureteral stone fragments 11/18/11.  The stone fragments were removed at the time of surgery.  She had cystoureterscopy, R RPG, and stone extraction 10/21/11 for a residual right distal ureteral stone fragment s/p prior cystoureteroscopy, HLL and right distal ureteral stone extraction 08/12/11.  She was seen in the office 10/30/11 in follow up and was without complaints at that time.  She then presented back to the office 11/18/11 for acute evaluation of severe right flank pain with assiciated N/V.  The pain awoke her from sleep Sun. am 11/16/11 and had persisted intermittently.  The pain felt identical to that which she was having with the distal ureteral calculi.  KUB at that time demonstrated a calcification over the expected course of the right distal ureter and renal U/S showed severe hydroureteronephrosis.  She has  remaining small nonobstructing left renal calculi as documented on CTU 10/21/11.   Past Medical History Problems  1. History of  Anxiety (Symptom) 300.00 2. History of  Asthma 493.90 3. History of  Esophageal Reflux 530.81 4. History of  Hypercholesterolemia 272.0 5. History of  Murmurs 785.2  Surgical History Problems  1. History of  Bladder Surgery 2. History of  Cesarean Section 3. History of  Cystoscopy With Insertion Of Ureteral Stent Right 4. History of  Cystoscopy With Ureteroscopy With Lithotripsy 5. History of  Cystoscopy With Ureteroscopy With Removal Of Calculus 6. History of  Cystoscopy With Ureteroscopy With Removal Of Calculus 7. History of  Hysterectomy V45.77 8. History of  Posterior Colporrhaphy (For Pelvic Relaxation) 9. History of  Sacrospinous Ligament Fixation For Posthysterectomy Prolapse 10. History of  Tubal Ligation V25.2 11. History of  Vaginal Surg Insertion Of Mesh For Pelvic Floor Repair  Current Meds 1. Aspirin 81 MG Oral Tablet; Therapy: (Recorded:09Oct2012) to 2. Clidinium-Chlordiazepoxide 2.5-5 MG Oral Capsule; Therapy: 10Nov2011 to 3. Dexilant 60 MG Oral Capsule Delayed Release; Therapy: 28Jan2011 to 4. Dulera AERO; Therapy: (Recorded:02May2012) to 5. Estradiol Cream 0.02%; Use 1ml syringe 3 x a week; Therapy: 09Oct2012 to (Last Rx:09Oct2012)   Requested for: 09Oct2012 6. Lipitor 20 MG Oral Tablet; Therapy: (Recorded:02May2012) to 7. LORazepam 0.5 MG Oral Tablet; Therapy: (Recorded:02May2012) to 8. Maxalt 10 MG Oral Tablet; Therapy: 23Apr2012 to 9. Methocarbamol 500 MG Oral Tablet; Therapy: 23Apr2012 to 10. MiraLax Oral Powder; Therapy: (  Recorded:16Jul2012) to 11. Nitrofurantoin Macrocrystal 100 MG Oral Capsule; TAKE 1 CAPSULE BY MOUTH EVERY 12   HOURS; Therapy: 12Aug2013 to (Evaluate:22Aug2013)  Requested for: 12Aug2013; Last   Rx:12Aug2013 12. Patanol 0.1 % Ophthalmic Solution; Therapy: 26Mar2012 to 13. Phenazopyridine HCl 200 MG Oral Tablet;  TAKE 1 TABLET Every 6 hours as needed for bladder   pain, frequency, urgency; Therapy: 07Aug2013 to (Last Rx:07Aug2013)  Requested for:   07Aug2013 14. ProAir HFA 108 (90 Base) MCG/ACT Inhalation Aerosol Solution; Therapy: 04Jan2013 to 15. Restasis 0.05 % Ophthalmic Emulsion; Therapy: 29Jan2013 to 16. Stool Softener CAPS; Therapy: (Recorded:02May2012) to 17. TiZANidine HCl 4 MG Oral Tablet; Therapy: 13Sep2011 to 18. TraMADol HCl 50 MG Oral Tablet; TAKE 1 TABLET Every 6 hours PRN; Therapy: 26Jun2013 to   (Last Rx:26Jun2013)  Requested for: 26Jun2013 19. Trimethoprim 100 MG Oral Tablet; Take 1 tablet every day; Therapy: 03Jul2013 to   (Evaluate:29Jan2014)  Requested for: 03Jul2013; Last Rx:03Jul2013 20. ValACYclovir HCl 500 MG Oral Tablet; Therapy: 15Jun2012 to 21. Vitamin D (Ergocalciferol) 50000 UNIT Oral Capsule; Therapy: 10Apr2012 to 22. Vivelle-Dot 0.05 MG/24HR Transdermal Patch Biweekly; Therapy: 19Aug2011 to 23. Zonisamide 100 MG Oral Capsule; Therapy: 13Sep2011 to  Allergies Medication  1. Codeine Derivatives Non-Medication  2. Contrast Dye  Family History Problems  1. Paternal history of  Bladder Cancer V16.52 2. Maternal history of  Blood In Urine 3. Paternal history of  Death In The Family Father father passed @ age 46staph infection 4. Maternal history of  Death In The Family Mother mother passed @ age 73brain bleed 5. Family history of  Family Health Status Number Of Children 1 son2 daughters 6. Maternal history of  Nephrolithiasis  Social History Problems  1. Alcohol Use 2 drinks per week 2. Caffeine Use no caffeine 3. Marital History - Single female partner 4. 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 History of  Tobacco Use  Review of Systems Genitourinary, constitutional, skin, eye, otolaryngeal, hematologic/lymphatic, cardiovascular, pulmonary, endocrine, musculoskeletal, gastrointestinal, neurological and psychiatric system(s) were  reviewed and pertinent findings if present are noted.  Gastrointestinal: nausea, flank pain and abdominal pain.    Vitals Vital Signs [Data Includes: Last 1 Day]  13Sep2013 09:36AM  Blood Pressure: 129 / 76 Temperature: 97 F Heart Rate: 63  Physical Exam Constitutional: Well nourished and well developed . No acute distress.  ENT:. The ears and nose are normal in appearance.  Neck: The appearance of the neck is normal and no neck mass is present.  Pulmonary: No respiratory distress and normal respiratory rhythm and effort.  Cardiovascular:. No peripheral edema.  Abdomen: moderate right CVA tenderness.  Skin: Normal skin turgor, no visible rash and no visible skin lesions.  Neuro/Psych:. Mood and affect are appropriate.    Results/Data Urine [Data Includes: Last 1 Day]   13Sep2013  COLOR YELLOW   APPEARANCE CLOUDY   SPECIFIC GRAVITY 1.015   pH 6.5   GLUCOSE NEG mg/dL  BILIRUBIN NEG   KETONE NEG mg/dL  BLOOD TRACE   PROTEIN NEG mg/dL  UROBILINOGEN 0.2 mg/dL  NITRITE NEG   LEUKOCYTE ESTERASE MOD   SQUAMOUS EPITHELIAL/HPF FEW   WBC 3-6 WBC/hpf  RBC 3-6 RBC/hpf  BACTERIA FEW   CRYSTALS Calcium Oxalate crystals noted   CASTS NONE SEEN   Other Amorphous noted    The following images/tracing/specimen were independently visualized: .  CTU demonstrates severe right hydroureteronephrosis secondary to an 8mm stone in a dilated renal pelvis vs UPJ.  Small nonobstructing stone in the left  kidney.  The following clinical lab reports were reviewed: Marland Kitchen  Urinalysis Selected Results  UA With REFLEX 13Sep2013 09:16AM Bruning, Ashlyn   Test Name Result Flag Reference  COLOR YELLOW  YELLOW  APPEARANCE CLOUDY A CLEAR  SPECIFIC GRAVITY 1.015  1.005-1.030  pH 6.5  5.0-8.0  GLUCOSE NEG mg/dL  NEG  BILIRUBIN NEG  NEG  KETONE NEG mg/dL  NEG  BLOOD TRACE A NEG  PROTEIN NEG mg/dL  NEG  UROBILINOGEN 0.2 mg/dL  6.2-1.3  NITRITE NEG  NEG  LEUKOCYTE ESTERASE MOD A NEG  SQUAMOUS EPITHELIAL/HPF  FEW  RARE  WBC 3-6 WBC/hpf A <3  RBC 3-6 RBC/hpf A <3  BACTERIA FEW A RARE  Calcium Oxalate crystals noted  CASTS NONE SEEN  NONE SEEN  Other Amorphous noted     Assessment Assessed  1. Diffuse Abdominal Pain 789.07 2. Nephrolithiasis 592.0 3. Proximal Ureteral Stone On The Right 592.1   This case was discussed with Dr. Brunilda Payor who agrees with stated assessment/plan.  He will perform urgent right DJ stent placement today in Dr. Imelda Pillow absence.  Risks and benefits of procedure discussed in detail and pt wishes to proceed with cytoureteroscopy for right DJ stent placement.  Orders completed.   Plan  Diffuse Abdominal Pain (789.07)  1. Ketorolac Tromethamine 60 MG/2ML Injection Solution; INJECT 60  MG Intramuscular; Done:  13Sep2013 10:11AM; Status: COMPLETE Health Maintenance (V70.0)  2. UA With REFLEX  Done: 13Sep2013 09:16AM     Toradol 60mg  IM, Demerol 50mg  IM and Phenergan 25mg  IM today in the office with relief. Pt will have urgent cystoureteroscopy with right RGP, possible HLL and stone extraction vs right DJ stent placement with definitive tx of stone at a later date. AU CT-STONE PROTOCOL  Status: Resulted - Requires Verification  Done: 01Jan0001 12:00AM Ordered Today; For: Nephrolithiasis (592.0); Ordered By: Marcello Fennel  Due: 15Sep2013 Marked Important; Last Updated By: Danella Deis   Signatures Electronically signed by : Marcello Fennel, PA-C; Mar 05 2012  4:27PM

## 2012-03-15 ENCOUNTER — Encounter (HOSPITAL_COMMUNITY): Payer: Self-pay

## 2012-03-15 ENCOUNTER — Ambulatory Visit (HOSPITAL_COMMUNITY)
Admission: RE | Admit: 2012-03-15 | Discharge: 2012-03-15 | Disposition: A | Discharge status: Home / Self Care | Payer: BC Managed Care – PPO | Source: Ambulatory Visit | Attending: Urology | Admitting: Urology

## 2012-03-15 ENCOUNTER — Ambulatory Visit (HOSPITAL_COMMUNITY): Payer: BC Managed Care – PPO

## 2012-03-15 ENCOUNTER — Encounter (HOSPITAL_COMMUNITY)
Admission: RE | Disposition: A | Discharge status: Home / Self Care | Payer: Self-pay | Source: Ambulatory Visit | Attending: Urology

## 2012-03-15 DIAGNOSIS — K219 Gastro-esophageal reflux disease without esophagitis: Secondary | ICD-10-CM | POA: Insufficient documentation

## 2012-03-15 DIAGNOSIS — Z79899 Other long term (current) drug therapy: Secondary | ICD-10-CM | POA: Insufficient documentation

## 2012-03-15 DIAGNOSIS — N200 Calculus of kidney: Secondary | ICD-10-CM | POA: Insufficient documentation

## 2012-03-15 DIAGNOSIS — J45909 Unspecified asthma, uncomplicated: Secondary | ICD-10-CM | POA: Insufficient documentation

## 2012-03-15 DIAGNOSIS — E7800 Pure hypercholesterolemia, unspecified: Secondary | ICD-10-CM | POA: Insufficient documentation

## 2012-03-15 SURGERY — LITHOTRIPSY, ESWL
Anesthesia: LOCAL | Laterality: Right

## 2012-03-15 MED ORDER — ONDANSETRON HCL 4 MG/2ML IJ SOLN
4.0000 mg | Freq: Once | INTRAMUSCULAR | Status: AC
  Administered 2012-03-15: 4 mg via INTRAVENOUS
  Filled 2012-03-15: qty 2

## 2012-03-15 MED ORDER — DIPHENHYDRAMINE HCL 25 MG PO CAPS
25.0000 mg | ORAL_CAPSULE | ORAL | Status: AC
  Administered 2012-03-15: 25 mg via ORAL
  Filled 2012-03-15: qty 1

## 2012-03-15 MED ORDER — DEXTROSE-NACL 5-0.45 % IV SOLN
INTRAVENOUS | Status: DC
  Administered 2012-03-15: 10:00:00 via INTRAVENOUS

## 2012-03-15 MED ORDER — CIPROFLOXACIN HCL 500 MG PO TABS
500.0000 mg | ORAL_TABLET | ORAL | Status: AC
  Administered 2012-03-15: 500 mg via ORAL
  Filled 2012-03-15: qty 1

## 2012-03-15 MED ORDER — DIAZEPAM 5 MG PO TABS
10.0000 mg | ORAL_TABLET | ORAL | Status: AC
  Administered 2012-03-15: 10 mg via ORAL
  Filled 2012-03-15: qty 2

## 2012-03-15 NOTE — Progress Notes (Signed)
C/o of feeling nauseated with vomiting prior to arrival. Call to Dr Patsi Sears

## 2012-03-24 ENCOUNTER — Ambulatory Visit
Admission: RE | Admit: 2012-03-24 | Discharge: 2012-03-24 | Disposition: A | Discharge status: Home / Self Care | Payer: BC Managed Care – PPO | Source: Ambulatory Visit | Attending: Obstetrics & Gynecology | Admitting: Obstetrics & Gynecology

## 2012-03-24 DIAGNOSIS — Z1231 Encounter for screening mammogram for malignant neoplasm of breast: Secondary | ICD-10-CM

## 2012-05-03 ENCOUNTER — Other Ambulatory Visit: Payer: Self-pay | Admitting: Family Medicine

## 2012-05-07 ENCOUNTER — Encounter: Payer: Self-pay | Admitting: *Deleted

## 2012-05-14 LAB — HM COLONOSCOPY: HM Colonoscopy: NORMAL

## 2012-05-17 ENCOUNTER — Encounter: Payer: Self-pay | Admitting: Internal Medicine

## 2012-05-21 ENCOUNTER — Encounter: Payer: Self-pay | Admitting: Family Medicine

## 2012-08-06 ENCOUNTER — Other Ambulatory Visit: Payer: Self-pay | Admitting: Family Medicine

## 2012-08-12 ENCOUNTER — Encounter: Payer: Self-pay | Admitting: Cardiology

## 2012-08-18 ENCOUNTER — Ambulatory Visit (INDEPENDENT_AMBULATORY_CARE_PROVIDER_SITE_OTHER): Payer: BC Managed Care – PPO | Admitting: Family Medicine

## 2012-08-18 ENCOUNTER — Encounter: Payer: Self-pay | Admitting: Family Medicine

## 2012-08-18 VITALS — BP 110/70 | HR 80 | Wt 145.0 lb

## 2012-08-18 DIAGNOSIS — K219 Gastro-esophageal reflux disease without esophagitis: Secondary | ICD-10-CM

## 2012-08-18 DIAGNOSIS — N200 Calculus of kidney: Secondary | ICD-10-CM

## 2012-08-18 DIAGNOSIS — R51 Headache: Secondary | ICD-10-CM

## 2012-08-18 DIAGNOSIS — Z780 Asymptomatic menopausal state: Secondary | ICD-10-CM

## 2012-08-18 DIAGNOSIS — Z79899 Other long term (current) drug therapy: Secondary | ICD-10-CM

## 2012-08-18 LAB — CBC WITH DIFFERENTIAL/PLATELET
Hemoglobin: 14 g/dL (ref 12.0–15.0)
Lymphs Abs: 1.8 10*3/uL (ref 0.7–4.0)
Monocytes Relative: 7 % (ref 3–12)
Neutro Abs: 4.7 10*3/uL (ref 1.7–7.7)
Neutrophils Relative %: 65 % (ref 43–77)
RBC: 5.03 MIL/uL (ref 3.87–5.11)

## 2012-08-18 LAB — POCT URINALYSIS DIPSTICK
Spec Grav, UA: 1.015
Urobilinogen, UA: NEGATIVE

## 2012-08-18 MED ORDER — SULFAMETHOXAZOLE-TRIMETHOPRIM 800-160 MG PO TABS: 1.0000 | ORAL_TABLET | Freq: Two times a day (BID) | ORAL | Status: DC

## 2012-08-18 MED ORDER — ATORVASTATIN CALCIUM 20 MG PO TABS: ORAL_TABLET | ORAL | Status: DC

## 2012-08-18 MED ORDER — VALACYCLOVIR HCL 500 MG PO TABS: 500.0000 mg | ORAL_TABLET | Freq: Every day | ORAL | Status: DC

## 2012-08-18 NOTE — Progress Notes (Signed)
  Subjective:    Patient ID: Carol [name], female    DOB: 1951/05/09, 62 y.o.   MRN: [id number]  HPI She is here for medication check. 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 It occurs in the gluteal area. She would like a refill on her Valtrex which keeps this under control. She does have underlying reflux disease and continues on medications for this. She takes Lipitor and is having no difficulty with that. She and is on her asthma medication. She also is had a great deal of difficulty dealing with renal stones and is on medications for this. She also has underlying headaches and has medications that she uses on an as-needed basis. She does complain of urgency, frequency and dysuria.   Review of Systems     Objective:   Physical Exam alert and in no distress. Tympanic membranes and canals are normal. Throat is clear. Tonsils are normal. Neck is supple without adenopathy or thyromegaly. Cardiac exam shows a regular sinus rhythm without murmurs or gallops. Lungs are clear to auscultation. Urine microscopic was positive for white cells       Assessment & Plan:  Herpes - Plan: valACYclovir (VALTREX) 500 MG tablet  GERD (gastroesophageal reflux disease)  Menopause  Asthma  Hyperlipidemia LDL goal < 100 - Plan: atorvastatin (LIPITOR) 20 MG tablet, Lipid panel  Blood in urine - Plan: CANCELED: POCT Urinalysis Dipstick  Urinary tract infection - Plan: Urinalysis Dipstick, sulfamethoxazole-trimethoprim (BACTRIM DS,SEPTRA DS) 800-160 MG per tablet, Urine culture  Encounter for long-term (current) use of other medications - Plan: Lipid panel, CBC with Differential, Comprehensive metabolic panel  Renal stones  Headache she will continue on her present medication regimen. I will also get a culture on her urine especially with her underlying history of difficulty with renal stones and still having stones present.

## 2012-08-19 LAB — COMPREHENSIVE METABOLIC PANEL
Albumin: 4.4 g/dL (ref 3.5–5.2)
CO2: 25 mEq/L (ref 19–32)
Glucose, Bld: 89 mg/dL (ref 70–99)
Potassium: 4.9 mEq/L (ref 3.5–5.3)
Sodium: 141 mEq/L (ref 135–145)
Total Protein: 7.1 g/dL (ref 6.0–8.3)

## 2012-08-19 LAB — LIPID PANEL
Cholesterol: 171 mg/dL (ref 0–200)
LDL Cholesterol: 97 mg/dL (ref 0–99)
VLDL: 18 mg/dL (ref 0–40)

## 2012-08-19 NOTE — Progress Notes (Signed)
Quick Note:  The blood work is normal ______ 

## 2012-08-20 NOTE — Progress Notes (Signed)
Quick Note:  CALLED PT HOME # LEFT MESSAGE BLOOD WORK IS NORMAL ______

## 2012-08-22 LAB — URINE CULTURE

## 2012-08-23 MED ORDER — LEVOFLOXACIN 500 MG PO TABS: 500.0000 mg | ORAL_TABLET | Freq: Every day | ORAL | Status: DC

## 2012-08-23 NOTE — Addendum Note (Signed)
Addended by: Ronnald Nian on: 08/23/2012 09:31 AM   Modules accepted: Orders

## 2012-08-23 NOTE — Progress Notes (Signed)
  Subjective:    Patient ID: Carol [name], female    DOB: 1950-07-24, 62 y.o.   MRN: [id number]  HPI    Review of Systems     Objective:   Physical Exam        Assessment & Plan:  The culture came back positive for enterococcus. I will switch her to Levaquin.

## 2012-08-23 NOTE — Progress Notes (Signed)
Quick Note:  PT INFORMED AND VERBALIZED UNDERSTANDING  ______ 

## 2012-09-08 ENCOUNTER — Other Ambulatory Visit: Payer: Self-pay | Admitting: Urology

## 2012-09-09 ENCOUNTER — Encounter (HOSPITAL_COMMUNITY): Payer: Self-pay

## 2012-09-09 ENCOUNTER — Encounter (HOSPITAL_COMMUNITY)
Admission: RE | Admit: 2012-09-09 | Discharge: 2012-09-09 | Disposition: A | Discharge status: Home / Self Care | Payer: BC Managed Care – PPO | Source: Ambulatory Visit | Attending: Urology | Admitting: Urology

## 2012-09-09 ENCOUNTER — Encounter (HOSPITAL_COMMUNITY): Payer: Self-pay | Admitting: Pharmacy Technician

## 2012-09-09 HISTORY — DX: Unspecified osteoarthritis, unspecified site: M19.90

## 2012-09-09 HISTORY — DX: Anxiety disorder, unspecified: F41.9

## 2012-09-09 LAB — BASIC METABOLIC PANEL
BUN: 17 mg/dL (ref 6–23)
CO2: 26 mEq/L (ref 19–32)
Chloride: 108 mEq/L (ref 96–112)
Creatinine, Ser: 0.74 mg/dL (ref 0.50–1.10)
Glucose, Bld: 90 mg/dL (ref 70–99)

## 2012-09-09 LAB — CBC
HCT: 41.6 % (ref 36.0–46.0)
MCV: 86.1 fL (ref 78.0–100.0)
RDW: 13.9 % (ref 11.5–15.5)
WBC: 6.8 10*3/uL (ref 4.0–10.5)

## 2012-09-09 NOTE — Patient Instructions (Signed)
22 Carol [name]  09/09/2012   Your procedure is scheduled on: 09/10/12  Report to [facility] at 1015 AM.  Call this number if you have problems the morning of surgery [phone]   Remember:   Do not eat food or drink liquids After Midnight.     Take these medicines the morning of surgery with A SIP OF WATER: lipitor, dexilant   Do not wear jewelry, make-up or nail polish.  Do not wear lotions, powders, or perfumes. You may wear deodorant.  Do not shave 48 hours prior to surgery. Men may shave face and neck.  Do not bring valuables to the hospital.  Contacts, dentures or bridgework may not be worn into surgery.     Patients discharged the day of surgery will not be allowed to drive home.  Name and phone number of your driver: Lilla Shook [phone]   Please read over the following fact sheets that you were given: MRSA Information.  Birdie Sons, RN  pre op nurse call if needed [phone]    FAILURE TO FOLLOW THESE INSTRUCTIONS MAY RESULT IN CANCELLATION OF YOUR SURGERY   Patient Signature: ___________________________________________

## 2012-09-10 ENCOUNTER — Encounter (HOSPITAL_COMMUNITY): Payer: Self-pay | Admitting: Anesthesiology

## 2012-09-10 ENCOUNTER — Encounter (HOSPITAL_COMMUNITY): Payer: Self-pay | Admitting: *Deleted

## 2012-09-10 ENCOUNTER — Ambulatory Visit (HOSPITAL_COMMUNITY): Payer: BC Managed Care – PPO | Admitting: Anesthesiology

## 2012-09-10 ENCOUNTER — Ambulatory Visit (HOSPITAL_COMMUNITY)
Admission: RE | Admit: 2012-09-10 | Discharge: 2012-09-10 | Disposition: A | Discharge status: Home / Self Care | Payer: BC Managed Care – PPO | Source: Ambulatory Visit | Attending: Urology | Admitting: Urology

## 2012-09-10 ENCOUNTER — Encounter (HOSPITAL_COMMUNITY)
Admission: RE | Disposition: A | Discharge status: Home / Self Care | Payer: Self-pay | Source: Ambulatory Visit | Attending: Urology

## 2012-09-10 DIAGNOSIS — J45909 Unspecified asthma, uncomplicated: Secondary | ICD-10-CM | POA: Insufficient documentation

## 2012-09-10 DIAGNOSIS — Z79899 Other long term (current) drug therapy: Secondary | ICD-10-CM | POA: Insufficient documentation

## 2012-09-10 DIAGNOSIS — K219 Gastro-esophageal reflux disease without esophagitis: Secondary | ICD-10-CM | POA: Insufficient documentation

## 2012-09-10 DIAGNOSIS — F411 Generalized anxiety disorder: Secondary | ICD-10-CM | POA: Insufficient documentation

## 2012-09-10 DIAGNOSIS — N201 Calculus of ureter: Secondary | ICD-10-CM | POA: Insufficient documentation

## 2012-09-10 DIAGNOSIS — E7800 Pure hypercholesterolemia, unspecified: Secondary | ICD-10-CM | POA: Insufficient documentation

## 2012-09-10 DIAGNOSIS — Z01812 Encounter for preprocedural laboratory examination: Secondary | ICD-10-CM | POA: Insufficient documentation

## 2012-09-10 DIAGNOSIS — Z7982 Long term (current) use of aspirin: Secondary | ICD-10-CM | POA: Insufficient documentation

## 2012-09-10 HISTORY — PX: CYSTOSCOPY/RETROGRADE/URETEROSCOPY/STONE EXTRACTION WITH BASKET: SHX5317

## 2012-09-10 SURGERY — CYSTOSCOPY, WITH CALCULUS REMOVAL USING BASKET
Anesthesia: General | Laterality: Right | Wound class: Clean Contaminated

## 2012-09-10 MED ORDER — FENTANYL CITRATE 0.05 MG/ML IJ SOLN: 25.0000 ug | INTRAMUSCULAR | Status: DC | PRN

## 2012-09-10 MED ORDER — MIDAZOLAM HCL 5 MG/5ML IJ SOLN
INTRAMUSCULAR | Status: DC | PRN
  Administered 2012-09-10: 2 mg via INTRAVENOUS

## 2012-09-10 MED ORDER — ONDANSETRON HCL 4 MG/2ML IJ SOLN
INTRAMUSCULAR | Status: DC | PRN
  Administered 2012-09-10: 4 mg via INTRAVENOUS

## 2012-09-10 MED ORDER — CIPROFLOXACIN IN D5W 400 MG/200ML IV SOLN
400.0000 mg | INTRAVENOUS | Status: AC
  Administered 2012-09-10: 400 mg via INTRAVENOUS

## 2012-09-10 MED ORDER — LACTATED RINGERS IV SOLN: INTRAVENOUS | Status: DC

## 2012-09-10 MED ORDER — ACETAMINOPHEN 10 MG/ML IV SOLN
INTRAVENOUS | Status: DC | PRN
  Administered 2012-09-10: 1000 mg via INTRAVENOUS

## 2012-09-10 MED ORDER — CIPROFLOXACIN IN D5W 400 MG/200ML IV SOLN
INTRAVENOUS | Status: AC
  Filled 2012-09-10: qty 200

## 2012-09-10 MED ORDER — FENTANYL CITRATE 0.05 MG/ML IJ SOLN
INTRAMUSCULAR | Status: DC | PRN
  Administered 2012-09-10: 50 ug via INTRAVENOUS
  Administered 2012-09-10: 100 ug via INTRAVENOUS
  Administered 2012-09-10 (×2): 50 ug via INTRAVENOUS

## 2012-09-10 MED ORDER — LIDOCAINE HCL (CARDIAC) 20 MG/ML IV SOLN
INTRAVENOUS | Status: DC | PRN
  Administered 2012-09-10: 50 mg via INTRAVENOUS

## 2012-09-10 MED ORDER — PHENAZOPYRIDINE HCL 200 MG PO TABS: 200.0000 mg | ORAL_TABLET | Freq: Three times a day (TID) | ORAL | Status: DC | PRN

## 2012-09-10 MED ORDER — TRAMADOL-ACETAMINOPHEN 37.5-325 MG PO TABS: 1.0000 | ORAL_TABLET | Freq: Four times a day (QID) | ORAL | Status: DC | PRN

## 2012-09-10 MED ORDER — IOHEXOL 300 MG/ML  SOLN
INTRAMUSCULAR | Status: AC
  Filled 2012-09-10: qty 1

## 2012-09-10 MED ORDER — SODIUM CHLORIDE 0.9 % IR SOLN
Status: DC | PRN
  Administered 2012-09-10: 4000 mL via INTRAVESICAL

## 2012-09-10 MED ORDER — BELLADONNA ALKALOIDS-OPIUM 16.2-60 MG RE SUPP
RECTAL | Status: DC | PRN
  Administered 2012-09-10: 1 via RECTAL

## 2012-09-10 MED ORDER — LACTATED RINGERS IV SOLN
INTRAVENOUS | Status: DC
  Administered 2012-09-10: 1000 mL via INTRAVENOUS

## 2012-09-10 MED ORDER — DOXYCYCLINE HYCLATE 50 MG PO CAPS: 100.0000 mg | ORAL_CAPSULE | Freq: Two times a day (BID) | ORAL | Status: DC

## 2012-09-10 MED ORDER — KETOROLAC TROMETHAMINE 30 MG/ML IJ SOLN
INTRAMUSCULAR | Status: DC | PRN
  Administered 2012-09-10: 30 mg via INTRAVENOUS

## 2012-09-10 MED ORDER — BELLADONNA ALKALOIDS-OPIUM 16.2-60 MG RE SUPP
RECTAL | Status: AC
  Filled 2012-09-10: qty 1

## 2012-09-10 MED ORDER — ACETAMINOPHEN 10 MG/ML IV SOLN
INTRAVENOUS | Status: AC
  Filled 2012-09-10: qty 100

## 2012-09-10 MED ORDER — METOCLOPRAMIDE HCL 5 MG/ML IJ SOLN
INTRAMUSCULAR | Status: DC | PRN
  Administered 2012-09-10: 10 mg via INTRAVENOUS

## 2012-09-10 MED ORDER — PROPOFOL 10 MG/ML IV BOLUS
INTRAVENOUS | Status: DC | PRN
  Administered 2012-09-10: 180 mg via INTRAVENOUS

## 2012-09-10 MED ORDER — PROMETHAZINE HCL 25 MG/ML IJ SOLN: 6.2500 mg | INTRAMUSCULAR | Status: DC | PRN

## 2012-09-10 SURGICAL SUPPLY — 19 items
ADAPTER CATH URET PLST 4-6FR (CATHETERS) ×2 IMPLANT
ADPR CATH URET STRL DISP 4-6FR (CATHETERS) ×1
BAG URO CATCHER STRL LF (DRAPE) ×2 IMPLANT
BASKET STNLS GEMINI 4WIRE 3FR (BASKET) ×2 IMPLANT
BASKET ZERO TIP NITINOL 2.4FR (BASKET) IMPLANT
BSKT STON RTRVL GEM 120X11 3FR (BASKET) ×2
BSKT STON RTRVL ZERO TP 2.4FR (BASKET)
CATH INTERMIT  6FR 70CM (CATHETERS) ×2 IMPLANT
CLOTH BEACON ORANGE TIMEOUT ST (SAFETY) ×2 IMPLANT
DRAPE CAMERA CLOSED 9X96 (DRAPES) ×2 IMPLANT
GLOVE BIOGEL M STRL SZ7.5 (GLOVE) ×2 IMPLANT
GOWN STRL REIN XL XLG (GOWN DISPOSABLE) ×2 IMPLANT
GUIDEWIRE STR DUAL SENSOR (WIRE) ×2 IMPLANT
LASER FIBER DISP (UROLOGICAL SUPPLIES) ×1 IMPLANT
MANIFOLD NEPTUNE II (INSTRUMENTS) ×2 IMPLANT
PACK CYSTO (CUSTOM PROCEDURE TRAY) ×2 IMPLANT
SCRUB PCMX 4 OZ (MISCELLANEOUS) ×2 IMPLANT
STENT CONTOUR 6FRX24X.038 (STENTS) ×1 IMPLANT
TUBING CONNECTING 10 (TUBING) ×2 IMPLANT

## 2012-09-10 NOTE — Anesthesia Preprocedure Evaluation (Addendum)
Anesthesia Evaluation  Patient identified by MRN, date of birth, ID band Patient awake    Reviewed: Allergy & Precautions, H&P , NPO status , Patient's Chart, lab work & pertinent test results  Airway Mallampati: II TM Distance: >3 FB Neck ROM: Full    Dental no notable dental hx.    Pulmonary asthma ,  breath sounds clear to auscultation  Pulmonary exam normal       Cardiovascular negative cardio ROS  Rhythm:Regular Rate:Normal     Neuro/Psych  Headaches, Anxiety  Neuromuscular disease    GI/Hepatic Neg liver ROS, hiatal hernia, GERD-  Medicated,  Endo/Other  negative endocrine ROS  Renal/GU Renal diseaseRight ureter stone.  negative genitourinary   Musculoskeletal negative musculoskeletal ROS (+)   Abdominal   Peds negative pediatric ROS (+)  Hematology negative hematology ROS (+)   Anesthesia Other Findings   Reproductive/Obstetrics negative OB ROS                          Anesthesia Physical Anesthesia Plan  ASA: II  Anesthesia Plan: General   Post-op Pain Management:    Induction: Intravenous  Airway Management Planned: LMA  Additional Equipment:   Intra-op Plan:   Post-operative Plan: Extubation in OR  Informed Consent: I have reviewed the patients History and Physical, chart, labs and discussed the procedure including the risks, benefits and alternatives for the proposed anesthesia with the patient or authorized representative who has indicated his/her understanding and acceptance.   Dental advisory given  Plan Discussed with: CRNA  Anesthesia Plan Comments:         Anesthesia Quick Evaluation

## 2012-09-10 NOTE — Transfer of Care (Signed)
Immediate Anesthesia Transfer of Care Note  Patient: Carol [name]  Procedure(s) Performed: Procedure(s): RIGHT URETEROSCOPY/STONE EXTRACTION/LASER LITHOTRIPSY/POSSIBLE DOUBLE J STENT PLACEMENT (Right)  Patient Location: PACU  Anesthesia Type:General  Level of Consciousness: awake, alert  and oriented  Airway & Oxygen Therapy: Patient Spontanous Breathing and Patient connected to face mask oxygen  Post-op Assessment: Report given to PACU RN and Post -op Vital signs reviewed and stable  Post vital signs: Reviewed and stable  Complications: No apparent anesthesia complications

## 2012-09-10 NOTE — Interval H&P Note (Signed)
History and Physical Interval Note:  09/10/2012 2:30 PM  Carol [name]  has presented today for surgery, with the diagnosis of right ureteral stone  The various methods of treatment have been discussed with the patient and family. After consideration of risks, benefits and other options for treatment, the patient has consented to  Procedure(s): RIGHT URETEROSCOPY/STONE EXTRACTION/LASER LITHOTRIPSY/POSSIBLE DOUBLE J STENT PLACEMENT (Right) as a surgical intervention .  The patient's history has been reviewed, patient examined, no change in status, stable for surgery.  I have reviewed the patient's chart and labs.  Questions were answered to the patient's satisfaction.     Jethro Bolus I

## 2012-09-10 NOTE — H&P (Signed)
eason For Visit  [name] presents today with right flank pain   History of Present Illness        [name] has a history of kidney stones.  She is is currently taking Potassium Citrate 20-60 MEQ/day and increasing her fluid intake per 24 hour urine results.  She was treated for a culture positive UTI in January with Cipro.        Interval history: [name] presents today with right flank pain.  This has been present intermittently for several weeks but seems to be getting worse in the past few days.  Nothing seems to make it better or worse.  It is associated with nausea.  She denies any LUTS, dysuria, hematuria, or fever.   Past Medical History Problems  1. History of  Anxiety (Symptom) 300.00 2. History of  Asthma 493.90 3. History of  Esophageal Reflux 530.81 4. History of  Hypercholesterolemia 272.0 5. History of  Murmurs 785.2  Surgical History Problems  1. History of  Bladder Surgery 2. History of  Cesarean Section 3. History of  Cystoscopy With Insertion Of Ureteral Stent Right 4. History of  Cystoscopy With Insertion Of Ureteral Stent Right 5. History of  Cystoscopy With Ureteroscopy With Lithotripsy 6. History of  Cystoscopy With Ureteroscopy With Removal Of Calculus 7. History of  Cystoscopy With Ureteroscopy With Removal Of Calculus 8. History of  Hysterectomy V45.77 9. History of  Lithotripsy 10. History of  Posterior Colporrhaphy (For Pelvic Relaxation) 11. History of  Sacrospinous Ligament Fixation For Posthysterectomy Prolapse 12. History of  Tubal Ligation V25.2 13. History of  Vaginal Surg Insertion Of Mesh For Pelvic Floor Repair  Current Meds 1. Aspirin 81 MG Oral Tablet; Therapy: (Recorded:09Oct2012) to 2. Clidinium-Chlordiazepoxide 2.5-5 MG Oral Capsule; Therapy: 10Nov2011 to 3. Dexilant 60 MG Oral Capsule Delayed Release; Therapy: 28Jan2011 to 4. Estradiol Cream 0.02%; Use 1ml syringe 3 x a week; Therapy: 09Oct2012 to (Last Rx:09Oct2012)   Requested for: 09Oct2012 5. Lipitor 20 MG Oral Tablet; Therapy: (Recorded:02May2012) to 6. LORazepam 0.5 MG Oral Tablet; Therapy: (Recorded:02May2012) to 7. Maxalt 10 MG Oral Tablet; Therapy: 23Apr2012 to 8. Methocarbamol 500 MG Oral Tablet; Therapy: 23Apr2012 to 9. Restasis 0.05 % Ophthalmic Emulsion; Therapy: 29Jan2013 to 10. Stool Softener CAPS; Therapy: (Recorded:02May2012) to 11. TiZANidine HCl 4 MG Oral Tablet; Therapy: 13Sep2011 to 12. Urocit-K 10 10 MEQ (1080 MG) Oral Tablet Extended Release; Take 1 tablet twice daily; Therapy:   30Oct2013 to (Evaluate:27Nov2014); Last Rx:02Dec2013 13. ValACYclovir HCl 500 MG Oral Tablet; Therapy: 15Jun2012 to 14. Vitamin D (Ergocalciferol) 50000 UNIT Oral Capsule; Therapy: 10Apr2012 to 15. Vivelle-Dot 0.05 MG/24HR Transdermal Patch Biweekly; Therapy: 19Aug2011 to 16. Zonisamide 100 MG Oral Capsule; Therapy: 13Sep2011 to  Allergies Medication  1. Codeine Derivatives Non-Medication  2. Contrast Dye  Family History Problems  1. Paternal history of  Bladder Cancer V16.52 2. Maternal history of  Blood In Urine 3. Paternal history of  Death In The Family Father father passed @ age 59staph infection 4. Maternal history of  Death In The Family Mother mother passed @ age 73brain bleed 5. Family history of  Family Health Status Number Of Children 1 son2 daughters 6. Maternal history of  Nephrolithiasis  Social History Problems  1. Alcohol Use 2 drinks per week 2. Caffeine Use no caffeine 3. Marital History - Single female partner 4. Never A Smoker 5. Occupation: Land  Review of Systems Genitourinary, constitutional and gastrointestinal system(s) were reviewed and pertinent findings if present are noted.  Gastrointestinal: flank pain.    Vitals Vital Signs [Data Includes: Last 1 Day]  19Mar2014 11:32AM  Blood Pressure: 125 / 83 Temperature: 98 F Heart Rate: 69  Physical Exam Constitutional: Well nourished and well  developed . No acute distress.  Pulmonary: No respiratory distress and normal respiratory rhythm and effort.  Abdomen: The abdomen is soft and nontender. Right CVA tenderness, but no left CVA tenderness.  Neuro/Psych:. Mood and affect are appropriate.    Results/Data Urine [Data Includes: Last 1 Day]   19Mar2014  COLOR YELLOW   APPEARANCE CLOUDY   SPECIFIC GRAVITY 1.025   pH 6.0   GLUCOSE NEG mg/dL  BILIRUBIN NEG   KETONE NEG mg/dL  BLOOD NEG   PROTEIN NEG mg/dL  UROBILINOGEN 0.2 mg/dL  NITRITE NEG   LEUKOCYTE ESTERASE MOD   SQUAMOUS EPITHELIAL/HPF FEW   WBC 21-50 WBC/hpf  RBC NONE SEEN RBC/hpf  BACTERIA MANY   CRYSTALS NONE SEEN   CASTS NONE SEEN    The following images/tracing/specimen were independently visualized: .  UA: sent for culture KUB: 2 stones seen in right ureter, no other obvious stones seen with the right kidney, possible stones seen within the left kidney, no stones seen over the contour of the left ureter, stable pelvic phleboliths right renal u/s: I have reviewed the films and report.  There are no masses, lesions, or stones seen. There is severe hydronephrosis seen.  The bladder is relatively empty without abnormalities.  See study sheet for details and measurements.    Assessment Assessed  1. Flank Pain Right 2. Nephrolithiasis 592.0   [name]' pain is from her remaining right ureteral stones.  I will have Dr. Patsi Sears take a look at her films, but she likely needs a ureteroscopy and stone extraction as soon as possible. I did provide her with a prescription for pain medication and nausea medication today. In the mean time, I will culture her urine to rule out an infection.   Plan Health Maintenance (V70.0)  1. UA With REFLEX  Done: 19Mar2014 11:25AM Nephrolithiasis (592.0)  2. Nucynta ER 50 MG Oral Tablet Extended Release 12 Hour; TAKE 1 TABLET Every twelve hours  PRN pain; Therapy: 01Jun2012 to (Evaluate:24Mar2014); Last Rx:19Mar2014 3.  Promethazine HCl 25 MG Oral Tablet; TAKE 1 TABLET EVERY 4 TO 6 HOURS AS NEEDED;  Therapy: 19Mar2014 to (Evaluate:26Mar2014)  Requested for: 19Mar2014; Last Rx:19Mar2014;  Edited 4. KUB  Done: 19Mar2014 12:00AM 5. RENAL U/S RIGHT  Done: 19Mar2014 12:00AM   1.  Culture urine- call with results and antibiotic if needed 2. Schedule ureteroscopy and stone extraction   Signatures Electronically signed by : Denna Haggard, NP-C; Sep 08 2012  1:47PM

## 2012-09-10 NOTE — Anesthesia Postprocedure Evaluation (Signed)
  Anesthesia Post-op Note  Patient: Carol [name]  Procedure(s) Performed: Procedure(s) (LRB): RIGHT URETEROSCOPY/STONE EXTRACTION/LASER LITHOTRIPSY/POSSIBLE DOUBLE J STENT PLACEMENT (Right)  Patient Location: PACU  Anesthesia Type: General  Level of Consciousness: awake and alert   Airway and Oxygen Therapy: Patient Spontanous Breathing  Post-op Pain: mild  Post-op Assessment: Post-op Vital signs reviewed, Patient's Cardiovascular Status Stable, Respiratory Function Stable, Patent Airway and No signs of Nausea or vomiting  Last Vitals:  Filed Vitals:   09/10/12 1558  BP: 110/79  Pulse: 69  Temp: 36.3 C  Resp: 14    Post-op Vital Signs: stable   Complications: No apparent anesthesia complications

## 2012-09-10 NOTE — Op Note (Signed)
Pre-operative diagnosis :   Retained R ureteral stones   Postoperative diagnosis:   Same  Operation:  Cystoscopy, right ureteroscopy and laser fragmentation of the ureteral stones.  Surgeon:  Kathie Rhodes. Patsi Sears, MD  First assistant: None  Anesthesia:  general  Preparation:   After appropriate pre-anesthesia, the patient is brought to the OR  And placed upon the OR table in the supine position, where general anesthesia is introduced.She is placed in the dorsal lithotomy position and the pubis is prepped with antibiotic soap, and draped in the usual fashion. The armband is double checked.   Review history:  [name] has a history of kidney stones. She is is currently taking Potassium Citrate 20-60 MEQ/day and increasing her fluid intake per 24 hour urine results. She was treated for a culture positive UTI in January with Cipro.  Interval history: [name] presents today with right flank pain. This has been present intermittently for several weeks but seems to be getting worse in the past few days. Nothing seems to make it better or worse. It is associated with nausea. She denies any LUTS, dysuria, hematuria, or fever.    Statement of  Likelihood of Success: Excellent. TIME-OUT observed.:  Procedure: Cystoscopy is accomplished, shows normal trigone. Right retrograde pyelogram is not performed because of her allergy, so ureteroscopy id performed directly. 2 stones are found in the Right lower ureter, and the laser is used to pulverize the  Stones. Basket is used to capture the stones, and more laser fragmentation is necessary, using power setting of 5/20. The stones ar pulverized, and 57F 24cm stent is placed with string attached-to be removed Monday.     She is awakened and taken to the PAR in good condition.

## 2012-09-13 ENCOUNTER — Encounter (HOSPITAL_COMMUNITY): Payer: Self-pay | Admitting: Urology

## 2012-11-22 ENCOUNTER — Other Ambulatory Visit: Payer: Self-pay | Admitting: Family Medicine

## 2012-11-25 ENCOUNTER — Telehealth: Payer: Self-pay | Admitting: Family Medicine

## 2012-11-29 NOTE — Telephone Encounter (Signed)
LM

## 2013-02-01 ENCOUNTER — Other Ambulatory Visit: Payer: Self-pay | Admitting: *Deleted

## 2013-02-01 MED ORDER — ESTRADIOL 0.05 MG/24HR TD PTTW: 1.0000 | MEDICATED_PATCH | TRANSDERMAL | Status: DC

## 2013-02-01 NOTE — Telephone Encounter (Signed)
Faxed refill request received from pharmacy for VIVELLE-DOT Last filled by MD on 02/19/12 X 1 YEAR Last AEX - 02/19/12 Next AEX - 02/22/13  Please advise refills.

## 2013-02-02 ENCOUNTER — Telehealth: Payer: Self-pay | Admitting: Nurse Practitioner

## 2013-02-02 ENCOUNTER — Other Ambulatory Visit: Payer: Self-pay | Admitting: Nurse Practitioner

## 2013-02-02 NOTE — Telephone Encounter (Signed)
eScribe request for refill on Vivelle Dot 0.05mg  Last filled - 02-19-12 Last AEX - 02-19-12 Next AEX - 02-22-13

## 2013-02-02 NOTE — Telephone Encounter (Signed)
erx

## 2013-02-02 NOTE — Telephone Encounter (Signed)
Vivelle dot.  Completely  Out of her patches . Needs refills.   [facility] [address]

## 2013-02-02 NOTE — Telephone Encounter (Signed)
RX sent by A. Dixon to pharmacy in REFILL note.

## 2013-02-22 ENCOUNTER — Ambulatory Visit (INDEPENDENT_AMBULATORY_CARE_PROVIDER_SITE_OTHER): Payer: BC Managed Care – PPO | Admitting: Nurse Practitioner

## 2013-02-22 ENCOUNTER — Other Ambulatory Visit: Payer: Self-pay | Admitting: Nurse Practitioner

## 2013-02-22 ENCOUNTER — Encounter: Payer: Self-pay | Admitting: Nurse Practitioner

## 2013-02-22 VITALS — BP 120/82 | HR 64 | Resp 16 | Ht 61.0 in | Wt 148.0 lb

## 2013-02-22 DIAGNOSIS — R6889 Other general symptoms and signs: Secondary | ICD-10-CM

## 2013-02-22 DIAGNOSIS — Z01419 Encounter for gynecological examination (general) (routine) without abnormal findings: Secondary | ICD-10-CM

## 2013-02-22 DIAGNOSIS — Z0000 Encounter for general adult medical examination without abnormal findings: Secondary | ICD-10-CM

## 2013-02-22 DIAGNOSIS — E559 Vitamin D deficiency, unspecified: Secondary | ICD-10-CM

## 2013-02-22 LAB — LIPID PANEL
Cholesterol: 162 mg/dL (ref 0–200)
HDL: 54 mg/dL (ref >39)
LDL Cholesterol: 86 mg/dL (ref 0–99)
Triglycerides: 111 mg/dL (ref <150)

## 2013-02-22 LAB — HEMOGLOBIN, FINGERSTICK: Hemoglobin, fingerstick: 14.7 g/dL (ref 12.0–16.0)

## 2013-02-22 LAB — COMPREHENSIVE METABOLIC PANEL
Albumin: 4.3 g/dL (ref 3.5–5.2)
BUN: 21 mg/dL (ref 6–23)
CO2: 26 mEq/L (ref 19–32)
Calcium: 9.2 mg/dL (ref 8.4–10.5)
Chloride: 110 mEq/L (ref 96–112)
Glucose, Bld: 113 mg/dL — ABNORMAL HIGH (ref 70–99)
Potassium: 5 mEq/L (ref 3.5–5.3)
Sodium: 142 mEq/L (ref 135–145)
Total Protein: 6.8 g/dL (ref 6.0–8.3)

## 2013-02-22 MED ORDER — VITAMIN D (ERGOCALCIFEROL) 1.25 MG (50000 UNIT) PO CAPS: 50000.0000 [IU] | ORAL_CAPSULE | ORAL | Status: DC

## 2013-02-22 MED ORDER — ESTRADIOL 0.05 MG/24HR TD PTTW: MEDICATED_PATCH | TRANSDERMAL | Status: DC

## 2013-02-22 MED ORDER — VALACYCLOVIR HCL 500 MG PO TABS: 500.0000 mg | ORAL_TABLET | Freq: Every day | ORAL | Status: DC | PRN

## 2013-02-22 NOTE — Patient Instructions (Signed)

## 2013-02-22 NOTE — Progress Notes (Signed)
Patient ID: Carol [name], female   DOB: January 28, 1951, 62 y.o.   MRN: [id number] 62 y.o. G3P3003 Single Caucasian Fe here for annual exam.  Doing well on Vivelle dot.  Since last here another kidney stone in September, and again stone fragments after lithotripsy was removed in 09/2012.  No LMP recorded. Patient has had a hysterectomy.          Sexually active: yes  The current method of family planning is status post hysterectomy.    Exercising: yes  Zumba Smoker:  no  Health Maintenance: Pap:  12/30/01  MMG:  03/24/12, BI-Rads 1: negative Colonoscopy:  05/14/12, 4 polyps, repeat 5 years BMD:   03/18/10, T Score: TDaP:  2010 Labs: HB: 14.7 Urine: Negative   reports that she has never smoked. She has never used smokeless tobacco. She reports that she drinks about 0.5 ounces of alcohol per week. She reports that she does not use illicit drugs.  Past Medical History  Diagnosis Date  . GERD (gastroesophageal reflux disease)   . Herpes simplex   . History of basal cell carcinoma 2008  . Migraine   . Right ureteral stone   . History of kidney stones   . H/O hiatal hernia   . Exercise-induced asthma   . Seasonal allergic rhinitis   . Environmental allergies   . Hyperlipidemia   . Chronic kidney disease     kidney stones  . Anxiety     claustrophobic  . Arthritis     neck, hands    Past Surgical History  Procedure Laterality Date  . Posterior pinnacle mesh sacrospinous repair  11-22-2010    RECURRENT ENTEROCELE  . Vault suspension (dermal graft)/ enterocele and rectocele repair  12-05-2005    VAULT PROLAPSE/ ENTERCELE/ RECTOCELE  . Abdominal hysterectomy  1992  . Right ureteroscopic stone extraction w/ stent placement  08-12-2011    x2 stents  . Cystoscopy/retrograde/ureteroscopy/stone extraction with basket  10/21/2011    Procedure: CYSTOSCOPY/RETROGRADE/URETEROSCOPY/STONE EXTRACTION WITH BASKET;  Surgeon: Kathi Ludwig, MD;  Location: [facility];  Service:  Urology;  Laterality: Right;  . Cystoscopy/retrograde/ureteroscopy  11/18/2011    Procedure: CYSTOSCOPY/RETROGRADE/URETEROSCOPY;  Surgeon: Kathi Ludwig, MD;  Location: [facility];  Service: Urology;  Laterality: Right;  . Cystoscopy w/ ureteral stent placement  03/05/2012    Procedure: CYSTOSCOPY WITH RETROGRADE PYELOGRAM/URETERAL STENT PLACEMENT;  Surgeon: Lindaann Slough, MD;  Location: [facility];  Service: Urology;  Laterality: Right;  . Cystoscopy/retrograde/ureteroscopy/stone extraction with basket Right 09/10/2012    Procedure: RIGHT URETEROSCOPY/STONE EXTRACTION/LASER LITHOTRIPSY/POSSIBLE DOUBLE J STENT PLACEMENT;  Surgeon: Kathi Ludwig, MD;  Location: [facility];  Service: Urology;  Laterality: Right;    Current Outpatient Prescriptions  Medication Sig Dispense Refill  . ASMANEX 120 METERED DOSES 220 MCG/INH inhaler INHALE 1 PUFF AS DIRECTED TWICE A DAY  1 Inhaler  5  . atorvastatin (LIPITOR) 20 MG tablet Take 20 mg by mouth every morning.      . clidinium-chlordiazePOXIDE (LIBRAX) 2.5-5 MG per capsule Take 1 capsule by mouth at bedtime.       Marland Kitchen dexlansoprazole (DEXILANT) 60 MG capsule Take 60 mg by mouth every morning.       . docusate sodium (COLACE) 100 MG capsule Take 200 mg by mouth 2 (two) times daily.        Marland Kitchen doxycycline (VIBRAMYCIN) 50 MG capsule Take 2 capsules (100 mg total) by mouth 2 (two) times daily.  14 capsule  0  . fesoterodine (TOVIAZ)  8 MG TB24 Take 8 mg by mouth at bedtime.       . methocarbamol (ROBAXIN) 500 MG tablet Take 500 mg by mouth 3 (three) times daily as needed (For muscle spasms.).       Marland Kitchen mometasone (ASMANEX 30 METERED DOSES) 220 MCG/INH inhaler Inhale 2 puffs into the lungs at bedtime.       . naproxen sodium (ALEVE) 220 MG tablet Take 220 mg by mouth 2 (two) times daily.      . phenazopyridine (PYRIDIUM) 200 MG tablet Take 1 tablet (200 mg total) by mouth 3 (three) times daily as needed for pain (discolors urine).  30 tablet  0  .  rizatriptan (MAXALT) 10 MG tablet Take 10 mg by mouth as needed for migraine. May repeat in 2 hours if needed      . silodosin (RAPAFLO) 8 MG CAPS capsule Take 8 mg by mouth every morning.       . traMADol-acetaminophen (ULTRACET) 37.5-325 MG per tablet Take 1 tablet by mouth every 6 (six) hours as needed for pain.  30 tablet  2  . valACYclovir (VALTREX) 500 MG tablet Take 500 mg by mouth daily as needed (For outbreaks.).      Marland Kitchen Vitamin D, Ergocalciferol, (DRISDOL) 50000 UNITS CAPS Take 50,000 Units by mouth every Sunday.       Marland Kitchen VIVELLE-DOT 0.05 MG/24HR patch APPLY ONE PATCH TWICE A WEEK, CHANGE SITES AND REAPPLY AS DIRECTED.  8 patch  0  . zonisamide (ZONEGRAN) 100 MG capsule Take 200 mg by mouth at bedtime.        No current facility-administered medications for this visit.   Facility-Administered Medications Ordered in Other Visits  Medication Dose Route Frequency Provider Last Rate Last Dose  . HYDROmorphone (DILAUDID) injection 0.25-0.5 mg  0.25-0.5 mg Intravenous Q5 min PRN Phillips Grout, MD      . meperidine (DEMEROL) injection 6.25-12.5 mg  6.25-12.5 mg Intravenous Q5 min PRN Phillips Grout, MD      . promethazine (PHENERGAN) injection 6.25-12.5 mg  6.25-12.5 mg Intravenous Q15 min PRN Phillips Grout, MD        No family history on file.  ROS:  Pertinent items are noted in HPI.  Otherwise, a comprehensive ROS was negative.  Exam:   BP 120/82  Pulse 64  Resp 16  Ht 5\' 1"  (1.549 m)  Wt 148 lb (67.132 kg)  BMI 27.98 kg/m2 Height: 5\' 1"  (154.9 cm)  Ht Readings from Last 3 Encounters:  02/22/13 5\' 1"  (1.549 m)  09/09/12 5' 0.75" (1.543 m)  03/15/12 5\' 1"  (1.549 m)    General appearance: alert, cooperative and appears stated age Head: Normocephalic, without obvious abnormality, atraumatic Neck: no adenopathy, supple, symmetrical, trachea midline and thyroid normal to inspection and palpation Lungs: clear to auscultation bilaterally Breasts: normal appearance, no masses or  tenderness Heart: regular rate and rhythm Abdomen: soft, non-tender; no masses,  no organomegaly Extremities: extremities normal, atraumatic, no cyanosis or edema Skin: Skin color, texture, turgor normal. No rashes or lesions Lymph nodes: Cervical, supraclavicular, and axillary nodes normal. No abnormal inguinal nodes palpated Neurologic: Grossly normal   Pelvic: External genitalia:  no lesions              Urethra:  normal appearing urethra with no masses, tenderness or lesions              Bartholin's and Skene's: normal  Vagina: normal appearing vagina with normal color and discharge, no lesions              Cervix: absent              Pap taken: no Bimanual Exam:  Uterus:  uterus absent              Adnexa: no mass, fullness, tenderness               Rectovaginal: Confirms               Anus:  normal sphincter tone, no lesions  A:  Well Woman with normal exam  S/P TAH secondary to  uterine prolapse 1992  History of HSV, Vit D deficiency  History of migraine headaches  History of renal calculi with multiple cysto, lithotripsy  S/P Vault Suspension with graft and mesh 3/07 & 6/12  P:   Pap smear as per guidelines   Mammogram due 10/14  Refill on ERT - Vivelle dot 0.05 mg twice weekly for a year.    Discussed potential risk with DVT, CVA, cancer, etc.  Counseled on breast self exam, adequate intake of calcium and vitamin D, diet and exercise, Kegel's exercises return annually or prn  An After Visit Summary was printed and given to the patient.

## 2013-02-25 ENCOUNTER — Other Ambulatory Visit: Payer: Self-pay | Admitting: Nurse Practitioner

## 2013-02-25 DIAGNOSIS — Z1231 Encounter for screening mammogram for malignant neoplasm of breast: Secondary | ICD-10-CM

## 2013-02-25 LAB — HEMOGLOBIN A1C
Hgb A1c MFr Bld: 5.6 % (ref <5.7)
Mean Plasma Glucose: 114 mg/dL (ref <117)

## 2013-02-26 NOTE — Progress Notes (Signed)
Bone density ordered for follow up of mild osteopenia of the spine.  Encounter reviewed by Dr. Conley Simmonds.

## 2013-03-03 NOTE — Addendum Note (Signed)
Addended by: Roanna Banning on: 03/03/2013 03:59 PM   Modules accepted: Orders

## 2013-03-28 ENCOUNTER — Ambulatory Visit
Admission: RE | Admit: 2013-03-28 | Discharge: 2013-03-28 | Disposition: A | Discharge status: Home / Self Care | Payer: BC Managed Care – PPO | Source: Ambulatory Visit | Attending: Nurse Practitioner | Admitting: Nurse Practitioner

## 2013-03-28 DIAGNOSIS — Z1231 Encounter for screening mammogram for malignant neoplasm of breast: Secondary | ICD-10-CM

## 2013-03-28 DIAGNOSIS — E559 Vitamin D deficiency, unspecified: Secondary | ICD-10-CM

## 2013-03-30 ENCOUNTER — Other Ambulatory Visit: Payer: Self-pay | Admitting: Nurse Practitioner

## 2013-03-30 DIAGNOSIS — R928 Other abnormal and inconclusive findings on diagnostic imaging of breast: Secondary | ICD-10-CM

## 2013-04-13 ENCOUNTER — Ambulatory Visit
Admission: RE | Admit: 2013-04-13 | Discharge: 2013-04-13 | Disposition: A | Discharge status: Home / Self Care | Payer: BC Managed Care – PPO | Source: Ambulatory Visit | Attending: Nurse Practitioner | Admitting: Nurse Practitioner

## 2013-04-13 DIAGNOSIS — R928 Other abnormal and inconclusive findings on diagnostic imaging of breast: Secondary | ICD-10-CM

## 2013-04-15 ENCOUNTER — Other Ambulatory Visit (HOSPITAL_COMMUNITY): Payer: Self-pay | Admitting: Urology

## 2013-04-15 DIAGNOSIS — N1330 Unspecified hydronephrosis: Secondary | ICD-10-CM

## 2013-04-21 ENCOUNTER — Encounter (HOSPITAL_COMMUNITY)
Admission: RE | Admit: 2013-04-21 | Discharge: 2013-04-21 | Disposition: A | Discharge status: Home / Self Care | Payer: BC Managed Care – PPO | Source: Ambulatory Visit | Attending: Urology | Admitting: Urology

## 2013-04-21 DIAGNOSIS — N1330 Unspecified hydronephrosis: Secondary | ICD-10-CM | POA: Insufficient documentation

## 2013-04-21 DIAGNOSIS — N200 Calculus of kidney: Secondary | ICD-10-CM | POA: Insufficient documentation

## 2013-04-21 MED ORDER — TECHNETIUM TC 99M MERTIATIDE
16.0000 | Freq: Once | INTRAVENOUS | Status: AC | PRN
  Administered 2013-04-21: 16 via INTRAVENOUS

## 2013-04-21 MED ORDER — FUROSEMIDE 10 MG/ML IJ SOLN
40.0000 mg | Freq: Once | INTRAMUSCULAR | Status: AC
  Administered 2013-04-21: 40 mg via INTRAVENOUS
  Filled 2013-04-21: qty 4

## 2013-05-04 ENCOUNTER — Telehealth: Payer: Self-pay | Admitting: *Deleted

## 2013-05-04 NOTE — Telephone Encounter (Signed)
I have attempted to contact this patient by phone with the following results: left message to return my call on answering machine (home).  

## 2013-05-05 NOTE — Telephone Encounter (Signed)
Returning a call to Stephanie. °

## 2013-05-05 NOTE — Telephone Encounter (Signed)
Pt.notified

## 2013-05-24 ENCOUNTER — Encounter: Payer: Self-pay | Admitting: Family Medicine

## 2013-05-24 ENCOUNTER — Ambulatory Visit (INDEPENDENT_AMBULATORY_CARE_PROVIDER_SITE_OTHER): Payer: BC Managed Care – PPO | Admitting: Family Medicine

## 2013-05-24 VITALS — BP 124/78 | HR 76 | Wt 153.0 lb

## 2013-05-24 DIAGNOSIS — L259 Unspecified contact dermatitis, unspecified cause: Secondary | ICD-10-CM

## 2013-05-24 DIAGNOSIS — L309 Dermatitis, unspecified: Secondary | ICD-10-CM

## 2013-05-24 MED ORDER — TRIAMCINOLONE ACETONIDE 0.1 % EX CREA: 1.0000 "application " | TOPICAL_CREAM | Freq: Two times a day (BID) | CUTANEOUS | Status: DC

## 2013-05-24 NOTE — Progress Notes (Signed)
Teaching Physician: Sharlot Gowda, MD Dictated By: Judithann Graves  Subjective:  Carol [name] is a 62 y.o. female with a PMH of asthma who presents for evaluation of a reddish, pruritic rash that began in August of this year. She noticed this small rash initially over the anterior aspect of her left lower leg and thought that it might have been from an insect bite. She applied cortisone cream with little improvement in her symptoms and she then visited a dermatologist in August. Dermatology's suggestion was that this likely represented eczema and they prescribed an ointment to be applied daily. She disliked the greasy residue that it left on her leg, and failing to notice improvement with her ointment, she quickly resumed daily use of her cortisone cream. Her rash persisted however and she began to notice involvement of her wrists, forearms, and shoulders one month ago. In spite of continued daily use of her cortisone cream, her rash remains unchanged to date. Sunlight, hot conditions worsen her pruritus and she notes that her symptoms are also worse at night. She describes no myalgias, muscular weakness, fever, or joint pain.     ROS as in subjective.  Objective: Filed Vitals:   05/24/13 0907  BP: 124/78  Pulse: 76    Physical Exam:  General: Alert and in no distress  Skin: Scattered erythematous raised papules over anterior aspect of LLE, bilateral wrists, forearms, shoulders. Minimal crusting present over excoriated wrist lesions.  Assessment and Plan: 1. Eczema Discussed goal of managing her eczema vs cure. Had previously tried a steroid ointment per Dermatology's recommendation but disliked greasy residue left on clothing. Thus encouraged to apply a thin film of Triamcinolone cream daily to the affected areas as well as an emollient daily immediately following her showers. She is to use this regularly until under good control and then back off as much is possible to keep it under  control. - triamcinolone cream (KENALOG) 0.1 %; Apply 1 application topically 2 (two) times daily.  Dispense: 45 g; Refill: 11   Dr. Susann Givens was present for the encounter and agrees with the above assessment and plan.

## 2013-05-24 NOTE — Patient Instructions (Signed)

## 2013-08-19 ENCOUNTER — Encounter: Payer: Self-pay | Admitting: Family Medicine

## 2013-08-19 ENCOUNTER — Ambulatory Visit (INDEPENDENT_AMBULATORY_CARE_PROVIDER_SITE_OTHER): Payer: BC Managed Care – PPO | Admitting: Family Medicine

## 2013-08-19 VITALS — BP 110/80 | HR 72 | Wt 155.0 lb

## 2013-08-19 DIAGNOSIS — R1013 Epigastric pain: Secondary | ICD-10-CM

## 2013-08-19 NOTE — Progress Notes (Signed)
   Subjective:    Patient ID: Carol [name], female    DOB: 1951/03/25, 63 y.o.   MRN: [id number]  HPI  6 days ago after eating ,30 minutes later she noted midepigastric pain and bloating. She has noted increase in stool and they're softer but has had no difficulty with vomiting. She cannot relate this to any particular food. She also complains of a one-month history of intermittent left facial numbness in the cheek area. It lasts usually 5 minutes. She is noticing that this is occurring more often. She does note slight dizziness but has not noted any weakness in the musculature.  Review of Systems     Objective:   Physical Exam Alert and in no distress. EOMI. Normal sensation to her face. Normal motion of facial muscles. TMs clear. Throat clear. Neck supple without adenopathy. Abdominal exam shows active bowel sounds. She is tender in the midepigastric area. Murphy's sign no Murphy's punch was positive.       Assessment & Plan:  Midepigastric pain - Plan: US Abdomen Limited RUQ  I explained that the transient nature of her facial symptoms is good however the fact that it does occur more often has me concerned. Recommend she be more attention to any other symptoms that might occur during that same timeframe and reevaluate at a later date. Followup on the ultrasound when it is complete.

## 2013-08-19 NOTE — Patient Instructions (Addendum)
Avoid greasy foods.  Take Advil or Aleve for the discomfort The next time you have no numbness pay attention if there is any weakness in your facial muscles

## 2013-08-20 ENCOUNTER — Encounter: Payer: Self-pay | Admitting: Family Medicine

## 2013-08-21 ENCOUNTER — Other Ambulatory Visit: Payer: Self-pay | Admitting: Family Medicine

## 2013-08-21 DIAGNOSIS — R112 Nausea with vomiting, unspecified: Secondary | ICD-10-CM

## 2013-08-21 DIAGNOSIS — Z9889 Other specified postprocedural states: Secondary | ICD-10-CM

## 2013-08-21 HISTORY — DX: Other specified postprocedural states: R11.2

## 2013-08-21 HISTORY — DX: Other specified postprocedural states: Z98.890

## 2013-08-22 NOTE — Telephone Encounter (Signed)
Forwarding to you :)

## 2013-08-22 NOTE — Telephone Encounter (Signed)
Medication sent in. 

## 2013-08-26 ENCOUNTER — Other Ambulatory Visit: Payer: BC Managed Care – PPO

## 2013-08-29 ENCOUNTER — Ambulatory Visit
Admission: RE | Admit: 2013-08-29 | Discharge: 2013-08-29 | Disposition: A | Discharge status: Home / Self Care | Payer: BC Managed Care – PPO | Source: Ambulatory Visit | Attending: Family Medicine | Admitting: Family Medicine

## 2013-08-29 DIAGNOSIS — R1013 Epigastric pain: Secondary | ICD-10-CM

## 2013-09-02 ENCOUNTER — Emergency Department (HOSPITAL_COMMUNITY): Payer: BC Managed Care – PPO | Admitting: Certified Registered Nurse Anesthetist

## 2013-09-02 ENCOUNTER — Encounter (HOSPITAL_COMMUNITY): Payer: BC Managed Care – PPO | Admitting: Certified Registered Nurse Anesthetist

## 2013-09-02 ENCOUNTER — Encounter (HOSPITAL_COMMUNITY)
Admission: EM | Disposition: A | Discharge status: Home / Self Care | Payer: Self-pay | Source: Home / Self Care | Attending: Emergency Medicine

## 2013-09-02 ENCOUNTER — Encounter (HOSPITAL_COMMUNITY): Payer: Self-pay | Admitting: Emergency Medicine

## 2013-09-02 ENCOUNTER — Emergency Department (HOSPITAL_COMMUNITY): Admission: EM | Admit: 2013-09-02 | Discharge: 2013-09-02 | Discharge status: Against Medical Advice | Payer: Self-pay

## 2013-09-02 ENCOUNTER — Telehealth (INDEPENDENT_AMBULATORY_CARE_PROVIDER_SITE_OTHER): Payer: Self-pay

## 2013-09-02 ENCOUNTER — Observation Stay (HOSPITAL_COMMUNITY)
Admission: EM | Admit: 2013-09-02 | Discharge: 2013-09-03 | Disposition: A | Discharge status: Home / Self Care | Payer: BC Managed Care – PPO | Attending: Surgery | Admitting: Surgery

## 2013-09-02 DIAGNOSIS — K219 Gastro-esophageal reflux disease without esophagitis: Secondary | ICD-10-CM | POA: Insufficient documentation

## 2013-09-02 DIAGNOSIS — Z9049 Acquired absence of other specified parts of digestive tract: Secondary | ICD-10-CM

## 2013-09-02 DIAGNOSIS — K8010 Calculus of gallbladder with chronic cholecystitis without obstruction: Principal | ICD-10-CM | POA: Insufficient documentation

## 2013-09-02 DIAGNOSIS — N200 Calculus of kidney: Secondary | ICD-10-CM | POA: Insufficient documentation

## 2013-09-02 DIAGNOSIS — F411 Generalized anxiety disorder: Secondary | ICD-10-CM | POA: Insufficient documentation

## 2013-09-02 DIAGNOSIS — Z91018 Allergy to other foods: Secondary | ICD-10-CM | POA: Insufficient documentation

## 2013-09-02 DIAGNOSIS — Z7982 Long term (current) use of aspirin: Secondary | ICD-10-CM | POA: Insufficient documentation

## 2013-09-02 DIAGNOSIS — Z85828 Personal history of other malignant neoplasm of skin: Secondary | ICD-10-CM | POA: Insufficient documentation

## 2013-09-02 DIAGNOSIS — R1110 Vomiting, unspecified: Secondary | ICD-10-CM

## 2013-09-02 DIAGNOSIS — K8020 Calculus of gallbladder without cholecystitis without obstruction: Secondary | ICD-10-CM | POA: Diagnosis present

## 2013-09-02 DIAGNOSIS — Z885 Allergy status to narcotic agent status: Secondary | ICD-10-CM | POA: Insufficient documentation

## 2013-09-02 DIAGNOSIS — K819 Cholecystitis, unspecified: Secondary | ICD-10-CM

## 2013-09-02 DIAGNOSIS — B009 Herpesviral infection, unspecified: Secondary | ICD-10-CM | POA: Insufficient documentation

## 2013-09-02 DIAGNOSIS — J45909 Unspecified asthma, uncomplicated: Secondary | ICD-10-CM | POA: Insufficient documentation

## 2013-09-02 DIAGNOSIS — R1013 Epigastric pain: Secondary | ICD-10-CM

## 2013-09-02 HISTORY — DX: Basal cell carcinoma of skin of other parts of face: C44.319

## 2013-09-02 HISTORY — PX: CHOLECYSTECTOMY: SHX55

## 2013-09-02 LAB — URINALYSIS, ROUTINE W REFLEX MICROSCOPIC
Bilirubin Urine: NEGATIVE
Glucose, UA: NEGATIVE mg/dL
Hgb urine dipstick: NEGATIVE
KETONES UR: NEGATIVE mg/dL
LEUKOCYTES UA: NEGATIVE
NITRITE: NEGATIVE
PROTEIN: NEGATIVE mg/dL
Specific Gravity, Urine: 1.013 (ref 1.005–1.030)
Urobilinogen, UA: 0.2 mg/dL (ref 0.0–1.0)
pH: 7 (ref 5.0–8.0)

## 2013-09-02 LAB — CBC WITH DIFFERENTIAL/PLATELET
BASOS ABS: 0 10*3/uL (ref 0.0–0.1)
BASOS PCT: 1 % (ref 0–1)
Eosinophils Absolute: 0.3 10*3/uL (ref 0.0–0.7)
Eosinophils Relative: 5 % (ref 0–5)
HCT: 43.8 % (ref 36.0–46.0)
HEMOGLOBIN: 15 g/dL (ref 12.0–15.0)
Lymphocytes Relative: 23 % (ref 12–46)
Lymphs Abs: 1.7 10*3/uL (ref 0.7–4.0)
MCH: 29.6 pg (ref 26.0–34.0)
MCHC: 34.2 g/dL (ref 30.0–36.0)
MCV: 86.4 fL (ref 78.0–100.0)
MONOS PCT: 7 % (ref 3–12)
Monocytes Absolute: 0.5 10*3/uL (ref 0.1–1.0)
Neutro Abs: 4.6 10*3/uL (ref 1.7–7.7)
Neutrophils Relative %: 65 % (ref 43–77)
Platelets: 244 10*3/uL (ref 150–400)
RBC: 5.07 MIL/uL (ref 3.87–5.11)
RDW: 13.1 % (ref 11.5–15.5)
WBC: 7.1 10*3/uL (ref 4.0–10.5)

## 2013-09-02 LAB — COMPREHENSIVE METABOLIC PANEL
ALBUMIN: 4.2 g/dL (ref 3.5–5.2)
ALK PHOS: 66 U/L (ref 39–117)
ALT: 20 U/L (ref 0–35)
AST: 21 U/L (ref 0–37)
BUN: 21 mg/dL (ref 6–23)
CO2: 22 mEq/L (ref 19–32)
CREATININE: 0.75 mg/dL (ref 0.50–1.10)
Calcium: 9.2 mg/dL (ref 8.4–10.5)
Chloride: 104 mEq/L (ref 96–112)
GFR calc Af Amer: >90 mL/min (ref >90)
GFR calc non Af Amer: >90 mL/min (ref >90)
Glucose, Bld: 101 mg/dL — ABNORMAL HIGH (ref 70–99)
POTASSIUM: 4.2 meq/L (ref 3.7–5.3)
Sodium: 142 mEq/L (ref 137–147)
TOTAL PROTEIN: 7.2 g/dL (ref 6.0–8.3)
Total Bilirubin: 0.3 mg/dL (ref 0.3–1.2)

## 2013-09-02 LAB — I-STAT TROPONIN, ED: TROPONIN I, POC: 0 ng/mL (ref 0.00–0.08)

## 2013-09-02 LAB — LIPASE, BLOOD: Lipase: 20 U/L (ref 11–59)

## 2013-09-02 LAB — TROPONIN I

## 2013-09-02 SURGERY — LAPAROSCOPIC CHOLECYSTECTOMY WITH INTRAOPERATIVE CHOLANGIOGRAM
Anesthesia: General | Site: Abdomen

## 2013-09-02 MED ORDER — ENOXAPARIN SODIUM 40 MG/0.4ML ~~LOC~~ SOLN
40.0000 mg | SUBCUTANEOUS | Status: DC
  Administered 2013-09-03: 40 mg via SUBCUTANEOUS
  Filled 2013-09-02 (×2): qty 0.4

## 2013-09-02 MED ORDER — SODIUM CHLORIDE 0.9 % IV SOLN
INTRAVENOUS | Status: DC
  Administered 2013-09-02: 15:00:00 via INTRAVENOUS

## 2013-09-02 MED ORDER — POTASSIUM CHLORIDE 2 MEQ/ML IV SOLN
INTRAVENOUS | Status: DC
  Filled 2013-09-02 (×3): qty 1000

## 2013-09-02 MED ORDER — BUPIVACAINE-EPINEPHRINE 0.25% -1:200000 IJ SOLN
INTRAMUSCULAR | Status: DC | PRN
  Administered 2013-09-02: 30 mL

## 2013-09-02 MED ORDER — LIDOCAINE HCL (CARDIAC) 20 MG/ML IV SOLN
INTRAVENOUS | Status: AC
  Filled 2013-09-02: qty 5

## 2013-09-02 MED ORDER — MIDAZOLAM HCL 2 MG/2ML IJ SOLN
INTRAMUSCULAR | Status: AC
  Filled 2013-09-02: qty 2

## 2013-09-02 MED ORDER — LACTATED RINGERS IV SOLN
INTRAVENOUS | Status: DC
  Administered 2013-09-02: 12:00:00 via INTRAVENOUS

## 2013-09-02 MED ORDER — HYDROMORPHONE HCL PF 1 MG/ML IJ SOLN
INTRAMUSCULAR | Status: AC
  Administered 2013-09-02: 0.5 mg via INTRAVENOUS
  Filled 2013-09-02: qty 1

## 2013-09-02 MED ORDER — KETOROLAC TROMETHAMINE 30 MG/ML IJ SOLN
INTRAMUSCULAR | Status: AC
  Filled 2013-09-02: qty 1

## 2013-09-02 MED ORDER — PROPOFOL 10 MG/ML IV BOLUS
INTRAVENOUS | Status: DC | PRN
  Administered 2013-09-02: 180 mg via INTRAVENOUS

## 2013-09-02 MED ORDER — ONDANSETRON HCL 4 MG PO TABS
4.0000 mg | ORAL_TABLET | Freq: Four times a day (QID) | ORAL | Status: DC | PRN
  Filled 2013-09-02: qty 1

## 2013-09-02 MED ORDER — MORPHINE SULFATE 2 MG/ML IJ SOLN: 1.0000 mg | INTRAMUSCULAR | Status: DC | PRN

## 2013-09-02 MED ORDER — LIDOCAINE HCL (CARDIAC) 20 MG/ML IV SOLN
INTRAVENOUS | Status: DC | PRN
  Administered 2013-09-02: 60 mg via INTRAVENOUS

## 2013-09-02 MED ORDER — KETOROLAC TROMETHAMINE 30 MG/ML IJ SOLN
INTRAMUSCULAR | Status: DC | PRN
  Administered 2013-09-02: 30 mg via INTRAVENOUS

## 2013-09-02 MED ORDER — HYDROMORPHONE HCL PF 1 MG/ML IJ SOLN
0.2500 mg | INTRAMUSCULAR | Status: DC | PRN
  Administered 2013-09-02: 0.5 mg via INTRAVENOUS

## 2013-09-02 MED ORDER — PROPOFOL 10 MG/ML IV BOLUS
INTRAVENOUS | Status: AC
  Filled 2013-09-02: qty 20

## 2013-09-02 MED ORDER — GLYCOPYRROLATE 0.2 MG/ML IJ SOLN
INTRAMUSCULAR | Status: DC | PRN
  Administered 2013-09-02: 0.4 mg via INTRAVENOUS

## 2013-09-02 MED ORDER — FENTANYL CITRATE 0.05 MG/ML IJ SOLN
INTRAMUSCULAR | Status: AC
  Filled 2013-09-02: qty 5

## 2013-09-02 MED ORDER — MORPHINE SULFATE 4 MG/ML IJ SOLN
4.0000 mg | Freq: Once | INTRAMUSCULAR | Status: DC
  Administered 2013-09-02: 4 mg via INTRAVENOUS
  Filled 2013-09-02: qty 1

## 2013-09-02 MED ORDER — OXYCODONE-ACETAMINOPHEN 5-325 MG PO TABS: 1.0000 | ORAL_TABLET | ORAL | Status: DC | PRN

## 2013-09-02 MED ORDER — CIPROFLOXACIN IN D5W 400 MG/200ML IV SOLN
400.0000 mg | INTRAVENOUS | Status: AC
  Administered 2013-09-02: 400 mg via INTRAVENOUS
  Filled 2013-09-02 (×2): qty 200

## 2013-09-02 MED ORDER — MIDAZOLAM HCL 5 MG/5ML IJ SOLN
INTRAMUSCULAR | Status: DC | PRN
  Administered 2013-09-02 (×2): 1 mg via INTRAVENOUS

## 2013-09-02 MED ORDER — ONDANSETRON HCL 4 MG/2ML IJ SOLN
4.0000 mg | INTRAMUSCULAR | Status: DC | PRN
  Filled 2013-09-02: qty 2

## 2013-09-02 MED ORDER — FENTANYL CITRATE 0.05 MG/ML IJ SOLN
INTRAMUSCULAR | Status: DC | PRN
  Administered 2013-09-02: 50 ug via INTRAVENOUS
  Administered 2013-09-02: 100 ug via INTRAVENOUS
  Administered 2013-09-02 (×2): 50 ug via INTRAVENOUS

## 2013-09-02 MED ORDER — MORPHINE SULFATE 4 MG/ML IJ SOLN
4.0000 mg | Freq: Once | INTRAMUSCULAR | Status: AC
  Administered 2013-09-02: 4 mg via INTRAVENOUS
  Filled 2013-09-02: qty 1

## 2013-09-02 MED ORDER — 0.9 % SODIUM CHLORIDE (POUR BTL) OPTIME
TOPICAL | Status: DC | PRN
  Administered 2013-09-02: 1000 mL

## 2013-09-02 MED ORDER — ARTIFICIAL TEARS OP OINT
TOPICAL_OINTMENT | OPHTHALMIC | Status: DC | PRN
  Administered 2013-09-02: 1 via OPHTHALMIC

## 2013-09-02 MED ORDER — PROMETHAZINE HCL 25 MG/ML IJ SOLN: 6.2500 mg | INTRAMUSCULAR | Status: DC | PRN

## 2013-09-02 MED ORDER — IBUPROFEN 600 MG PO TABS: 600.0000 mg | ORAL_TABLET | Freq: Four times a day (QID) | ORAL | Status: DC | PRN

## 2013-09-02 MED ORDER — SUCCINYLCHOLINE CHLORIDE 20 MG/ML IJ SOLN
INTRAMUSCULAR | Status: DC | PRN
  Administered 2013-09-02: 60 mg via INTRAVENOUS

## 2013-09-02 MED ORDER — ROCURONIUM BROMIDE 100 MG/10ML IV SOLN
INTRAVENOUS | Status: DC | PRN
  Administered 2013-09-02: 20 mg via INTRAVENOUS
  Administered 2013-09-02: 10 mg via INTRAVENOUS

## 2013-09-02 MED ORDER — ONDANSETRON HCL 4 MG/2ML IJ SOLN
INTRAMUSCULAR | Status: AC
  Filled 2013-09-02: qty 2

## 2013-09-02 MED ORDER — SODIUM CHLORIDE 0.9 % IR SOLN
Status: DC | PRN
  Administered 2013-09-02: 1000 mL

## 2013-09-02 MED ORDER — ONDANSETRON HCL 4 MG/2ML IJ SOLN
4.0000 mg | Freq: Once | INTRAMUSCULAR | Status: AC
  Administered 2013-09-02: 4 mg via INTRAVENOUS
  Filled 2013-09-02: qty 2

## 2013-09-02 MED ORDER — ONDANSETRON HCL 4 MG/2ML IJ SOLN
INTRAMUSCULAR | Status: DC | PRN
  Administered 2013-09-02: 4 mg via INTRAVENOUS

## 2013-09-02 MED ORDER — ONDANSETRON HCL 4 MG/2ML IJ SOLN
4.0000 mg | Freq: Four times a day (QID) | INTRAMUSCULAR | Status: DC | PRN
  Administered 2013-09-02: 4 mg via INTRAVENOUS
  Filled 2013-09-02: qty 2

## 2013-09-02 MED ORDER — BUPIVACAINE-EPINEPHRINE (PF) 0.25% -1:200000 IJ SOLN
INTRAMUSCULAR | Status: AC
  Filled 2013-09-02: qty 30

## 2013-09-02 MED ORDER — NEOSTIGMINE METHYLSULFATE 1 MG/ML IJ SOLN
INTRAMUSCULAR | Status: AC
  Filled 2013-09-02: qty 10

## 2013-09-02 MED ORDER — LACTATED RINGERS IV SOLN
INTRAVENOUS | Status: DC | PRN
  Administered 2013-09-02: 13:00:00 via INTRAVENOUS

## 2013-09-02 MED ORDER — GLYCOPYRROLATE 0.2 MG/ML IJ SOLN
INTRAMUSCULAR | Status: AC
  Filled 2013-09-02: qty 3

## 2013-09-02 MED ORDER — SODIUM CHLORIDE 0.9 % IV BOLUS (SEPSIS)
1000.0000 mL | Freq: Once | INTRAVENOUS | Status: AC
  Administered 2013-09-02: 1000 mL via INTRAVENOUS

## 2013-09-02 MED ORDER — NEOSTIGMINE METHYLSULFATE 1 MG/ML IJ SOLN
INTRAMUSCULAR | Status: DC | PRN
Start: 2013-09-02 — End: 2013-09-02
  Administered 2013-09-02: 3 mg via INTRAVENOUS

## 2013-09-02 MED ORDER — PROPOFOL 10 MG/ML IV BOLUS
INTRAVENOUS | Status: AC
Start: 2013-09-02 — End: 2013-09-02
  Filled 2013-09-02: qty 20

## 2013-09-02 MED ORDER — MORPHINE SULFATE 2 MG/ML IJ SOLN
1.0000 mg | INTRAMUSCULAR | Status: DC | PRN
  Administered 2013-09-02 (×3): 2 mg via INTRAVENOUS
  Administered 2013-09-02: 4 mg via INTRAVENOUS
  Administered 2013-09-02: 2 mg via INTRAVENOUS
  Administered 2013-09-03 (×2): 4 mg via INTRAVENOUS
  Filled 2013-09-02: qty 1
  Filled 2013-09-02: qty 2
  Filled 2013-09-02 (×2): qty 1
  Filled 2013-09-02: qty 2
  Filled 2013-09-02: qty 1
  Filled 2013-09-02 (×2): qty 2

## 2013-09-02 SURGICAL SUPPLY — 44 items
ADH SKN CLS APL DERMABOND .7 (GAUZE/BANDAGES/DRESSINGS) ×1
APL SKNCLS STERI-STRIP NONHPOA (GAUZE/BANDAGES/DRESSINGS) ×1
APPLIER CLIP 5 13 M/L LIGAMAX5 (MISCELLANEOUS) ×3
APR CLP MED LRG 5 ANG JAW (MISCELLANEOUS) ×1
BAG SPEC RTRVL LRG 6X4 10 (ENDOMECHANICALS) ×1
BANDAGE ADHESIVE 1X3 (GAUZE/BANDAGES/DRESSINGS) ×6 IMPLANT
BENZOIN TINCTURE PRP APPL 2/3 (GAUZE/BANDAGES/DRESSINGS) ×3 IMPLANT
CANISTER SUCTION 2500CC (MISCELLANEOUS) ×3 IMPLANT
CHLORAPREP W/TINT 26ML (MISCELLANEOUS) ×3 IMPLANT
CLIP APPLIE 5 13 M/L LIGAMAX5 (MISCELLANEOUS) ×1 IMPLANT
COVER MAYO STAND STRL (DRAPES) IMPLANT
COVER SURGICAL LIGHT HANDLE (MISCELLANEOUS) ×3 IMPLANT
DECANTER SPIKE VIAL GLASS SM (MISCELLANEOUS) ×3 IMPLANT
DERMABOND ADVANCED (GAUZE/BANDAGES/DRESSINGS) ×2
DERMABOND ADVANCED .7 DNX12 (GAUZE/BANDAGES/DRESSINGS) IMPLANT
DRAPE C-ARM 42X72 X-RAY (DRAPES) IMPLANT
DRAPE UTILITY 15X26 W/TAPE STR (DRAPE) ×6 IMPLANT
ELECT REM PT RETURN 9FT ADLT (ELECTROSURGICAL) ×3
ELECTRODE REM PT RTRN 9FT ADLT (ELECTROSURGICAL) ×1 IMPLANT
GLOVE BIO SURGEON STRL SZ7 (GLOVE) ×4 IMPLANT
GLOVE BIOGEL PI IND STRL 7.0 (GLOVE) IMPLANT
GLOVE BIOGEL PI INDICATOR 7.0 (GLOVE) ×4
GLOVE SURG SIGNA 7.5 PF LTX (GLOVE) ×3 IMPLANT
GLOVE SURG SS PI 7.0 STRL IVOR (GLOVE) ×2 IMPLANT
GOWN STRL REUS W/ TWL LRG LVL3 (GOWN DISPOSABLE) ×3 IMPLANT
GOWN STRL REUS W/ TWL XL LVL3 (GOWN DISPOSABLE) ×1 IMPLANT
GOWN STRL REUS W/TWL LRG LVL3 (GOWN DISPOSABLE) ×6
GOWN STRL REUS W/TWL XL LVL3 (GOWN DISPOSABLE) ×3
KIT BASIN OR (CUSTOM PROCEDURE TRAY) ×3 IMPLANT
KIT ROOM TURNOVER OR (KITS) ×3 IMPLANT
NS IRRIG 1000ML POUR BTL (IV SOLUTION) ×3 IMPLANT
PAD ARMBOARD 7.5X6 YLW CONV (MISCELLANEOUS) ×3 IMPLANT
POUCH SPECIMEN RETRIEVAL 10MM (ENDOMECHANICALS) ×3 IMPLANT
SCISSORS LAP 5X35 DISP (ENDOMECHANICALS) ×3 IMPLANT
SET CHOLANGIOGRAPH 5 50 .035 (SET/KITS/TRAYS/PACK) IMPLANT
SET IRRIG TUBING LAPAROSCOPIC (IRRIGATION / IRRIGATOR) ×3 IMPLANT
SLEEVE ENDOPATH XCEL 5M (ENDOMECHANICALS) ×6 IMPLANT
SPECIMEN JAR SMALL (MISCELLANEOUS) ×3 IMPLANT
SUT MON AB 4-0 PC3 18 (SUTURE) ×3 IMPLANT
TOWEL OR 17X24 6PK STRL BLUE (TOWEL DISPOSABLE) ×3 IMPLANT
TOWEL OR 17X26 10 PK STRL BLUE (TOWEL DISPOSABLE) ×3 IMPLANT
TRAY LAPAROSCOPIC (CUSTOM PROCEDURE TRAY) ×3 IMPLANT
TROCAR XCEL BLUNT TIP 100MML (ENDOMECHANICALS) ×3 IMPLANT
TROCAR XCEL NON-BLD 5MMX100MML (ENDOMECHANICALS) ×3 IMPLANT

## 2013-09-02 NOTE — ED Provider Notes (Signed)
CSN: [id number]     Arrival date & time 09/02/13  0905 History   First MD Initiated Contact with Patient 09/02/13 0911     Chief Complaint  Patient presents with  . Abdominal Pain     (Consider location/radiation/quality/duration/timing/severity/associated sxs/prior Treatment) HPI Comments: Carol [name] is a 63 y.o. female with a past medical history of Cholelithiasis, Renal Calculi, Diverticulosis, Asthma, presenting the Emergency Department with a chief complaint of worsening epigastric pain for 3 days.  She describes the discomfort as non-radiating, sharp, constat pain. The patient denies correlation of pain and oral intake or fatty foods.  The patient also reports one episode of non-bloody emesis today. She reports loose stool for 3 weeks. She denies aggravating factors. The patient reports initial epigastric pain 3 weeks ago, states she had an out-patient [place] and showed stones and has a surgical consult appointment on 09/05/2013 with Dr. Marlou Starks for further management of Cholelithiasis.  She reports taking an Aleve today without resolution of symptoms. Last oral intake, 0800 today, protein shake. PCP: Wyatt Haste, MD   Patient is a 63 y.o. female presenting with abdominal pain. The history is provided by the patient and medical records. No language interpreter was used.  Abdominal Pain Associated symptoms: nausea and vomiting   Associated symptoms: no chest pain, no chills, no constipation, no cough, no diarrhea, no dysuria, no fever and no hematuria     Past Medical History  Diagnosis Date  . GERD (gastroesophageal reflux disease)   . Herpes simplex 1976  . History of basal cell carcinoma 2008    over right eye  . Migraine teens, returned in 33"s  . Right ureteral stone   . History of kidney stones 11/06  . H/O hiatal hernia   . Exercise-induced asthma 30's  . Seasonal allergic rhinitis   . Environmental allergies   . Hyperlipidemia   . Chronic kidney disease    kidney stones  . Anxiety     claustrophobic  . Arthritis 2012    neck, hands   Past Surgical History  Procedure Laterality Date  . Posterior pinnacle mesh sacrospinous repair  11-22-2010    RECURRENT ENTEROCELE  . Vault suspension (dermal graft)/ enterocele and rectocele repair  12-05-2005    VAULT PROLAPSE/ ENTERCELE/ RECTOCELE  . Abdominal hysterectomy  1992  . Right ureteroscopic stone extraction w/ stent placement  08-12-2011    x2 stents  . Cystoscopy/retrograde/ureteroscopy/stone extraction with basket  10/21/2011    Procedure: CYSTOSCOPY/RETROGRADE/URETEROSCOPY/STONE EXTRACTION WITH BASKET;  Surgeon: Ailene Rud, MD;  Location: [facility];  Service: Urology;  Laterality: Right;  . Cystoscopy/retrograde/ureteroscopy  11/18/2011    Procedure: CYSTOSCOPY/RETROGRADE/URETEROSCOPY;  Surgeon: Ailene Rud, MD;  Location: [facility];  Service: Urology;  Laterality: Right;  . Cystoscopy w/ ureteral stent placement  03/05/2012    Procedure: CYSTOSCOPY WITH RETROGRADE PYELOGRAM/URETERAL STENT PLACEMENT;  Surgeon: Hanley Ben, MD;  Location: [facility];  Service: Urology;  Laterality: Right;  . Cystoscopy/retrograde/ureteroscopy/stone extraction with basket Right 09/10/2012    Procedure: RIGHT URETEROSCOPY/STONE EXTRACTION/LASER LITHOTRIPSY/POSSIBLE DOUBLE J STENT PLACEMENT;  Surgeon: Ailene Rud, MD;  Location: [facility];  Service: Urology;  Laterality: Right;  . Cesarean section  1981   Family History  Problem Relation Age of Onset  . Cancer Father    History  Substance Use Topics  . Smoking status: Never Smoker   . Smokeless tobacco: Never Used  . Alcohol Use: 0.5 oz/week    1 drink(s) per week  Comment: occassional beer   OB History   Grav Para Term Preterm Abortions TAB SAB Ect Mult Living   3 3 3       3      Review of Systems  Constitutional: Negative for fever and chills.  Respiratory: Negative for cough.     Cardiovascular: Negative for chest pain and palpitations.  Gastrointestinal: Positive for nausea, vomiting and abdominal pain. Negative for diarrhea, constipation and blood in stool.  Genitourinary: Negative for dysuria, urgency and hematuria.      Allergies  Iohexol; Ivp dye; Codeine; and Onion  Home Medications   Current Outpatient Rx  Name  Route  Sig  Dispense  Refill  . ASMANEX 120 METERED DOSES 220 MCG/INH inhaler      INHALE 1 PUFF AS DIRECTED TWICE A DAY   1 Inhaler   5   . aspirin EC 81 MG tablet   Oral   Take 81 mg by mouth daily.         Marland Kitchen atorvastatin (LIPITOR) 20 MG tablet      TAKE 1 TABLET ONCE DAILY.   30 tablet   3   . dexlansoprazole (DEXILANT) 60 MG capsule   Oral   Take 60 mg by mouth every morning.          . docusate sodium (COLACE) 100 MG capsule   Oral   Take 200 mg by mouth 2 (two) times daily.           Marland Kitchen estradiol (VIVELLE-DOT) 0.05 MG/24HR patch      APPLY ONE PATCH TWICE A WEEK, CHANGE SITES AND REAPPLY AS DIRECTED.   24 patch   3   . methocarbamol (ROBAXIN) 500 MG tablet   Oral   Take 500 mg by mouth as needed for muscle spasms.         . Multiple Vitamin (MULTI-VITAMIN PO)   Oral   Take 1 tablet by mouth daily.         . naproxen sodium (ALEVE) 220 MG tablet   Oral   Take 220 mg by mouth 2 (two) times daily.         . rizatriptan (MAXALT) 10 MG tablet   Oral   Take 10 mg by mouth as needed for migraine. May repeat in 2 hours if needed         . valACYclovir (VALTREX) 500 MG tablet   Oral   Take 1 tablet (500 mg total) by mouth daily as needed (For outbreaks.).   90 tablet   3   . Vitamin D, Ergocalciferol, (DRISDOL) 50000 UNITS CAPS capsule   Oral   Take 1 capsule (50,000 Units total) by mouth every Sunday.   30 capsule   3   . zonisamide (ZONEGRAN) 100 MG capsule   Oral   Take 200 mg by mouth at bedtime.           BP 114/78  Pulse 74  Temp(Src) 98.1 F (36.7 C) (Oral)  Resp 16  SpO2  96% Physical Exam  Nursing note and vitals reviewed. Constitutional: She is oriented to person, place, and time. She appears well-developed and well-nourished. No distress.  HENT:  Head: Normocephalic and atraumatic.  Eyes: EOM are normal. Pupils are equal, round, and reactive to light. No scleral icterus.  Neck: Neck supple.  Cardiovascular: Normal rate, regular rhythm and normal heart sounds.   No murmur heard. Pulmonary/Chest: Effort normal and breath sounds normal. She has no wheezes.  Abdominal: Soft. Bowel sounds  are normal. There is tenderness in the epigastric area. There is no rebound, no guarding, no CVA tenderness, no tenderness at McBurney's point and negative Murphy's sign.    Musculoskeletal: Normal range of motion. She exhibits no edema.  Neurological: She is alert and oriented to person, place, and time.  Skin: Skin is warm and dry. No rash noted.  Psychiatric: She has a normal mood and affect. Her behavior is normal. Thought content normal.    ED Course  Procedures (including critical care time) Labs Review Labs Reviewed  COMPREHENSIVE METABOLIC PANEL - Abnormal; Notable for the following:    Glucose, Bld 101 (*)    All other components within normal limits  URINALYSIS, ROUTINE W REFLEX MICROSCOPIC - Abnormal; Notable for the following:    APPearance HAZY (*)    All other components within normal limits  CBC WITH DIFFERENTIAL  LIPASE, BLOOD  TROPONIN I  I-STAT TROPOININ, ED   Imaging Review No results found.   EKG Interpretation None      MDM   Final diagnoses:  Cholecystitis  Vomiting  Pt with known Cholelithiasis, presents with worsening epigastric pain and one episode of non-bloody emesis. No Murphy's sign on exam, afebrile. Labs, fluids, pain medication ordered. EMR shows abdominal US 08/29/2013 IMPRESSION: Cholelithiasis without acute cholecystitis. No leukocytosis on exam. Lipase and LFT WNL. Re-eval pt reports partial symptom relief with  morphine. Consult to general surgery, Saverio Danker, PA-C, who agrees to evaluate in the ED. General surgery to admit the patient for a Laparoscopic cholecystectomy.  Meds given in ED:  Medications  dextrose 5 % and 0.45% NaCl 1,000 mL with potassium chloride 20 mEq infusion (not administered)  morphine 2 MG/ML injection 1-4 mg (not administered)  ondansetron (ZOFRAN) injection 4 mg (not administered)  ciprofloxacin (CIPRO) IVPB 400 mg (not administered)  sodium chloride 0.9 % bolus 1,000 mL (0 mLs Intravenous Stopped 09/02/13 1151)  ondansetron (ZOFRAN) injection 4 mg (4 mg Intravenous Given 09/02/13 1026)  morphine 4 MG/ML injection 4 mg (4 mg Intravenous Given 09/02/13 1026)    New Prescriptions   No medications on file        Lorrine Kin, PA-C 09/02/13 1209

## 2013-09-02 NOTE — H&P (Signed)
I have seen and examined the patient and agree with the assessment and plans. I discussed the risks of surgery with her.  These include but are not limited to bleeding, infection, injury to surrounding structures, bile leak, bile duct injury, need to convert to an open procedure, etc.  She agrees to proceed with lap chole  Carol [name] A. Ninfa Linden  MD, FACS

## 2013-09-02 NOTE — Anesthesia Postprocedure Evaluation (Signed)
  Anesthesia Post-op Note  Patient: Carol [name]  Procedure(s) Performed: Procedure(s): LAPAROSCOPIC CHOLECYSTECTOMY (N/A)  Patient Location: PACU  Anesthesia Type:General  Level of Consciousness: awake, alert  and oriented  Airway and Oxygen Therapy: Patient Spontanous Breathing  Post-op Pain: none  Post-op Assessment: Post-op Vital signs reviewed, Patient's Cardiovascular Status Stable, Respiratory Function Stable, Patent Airway, No signs of Nausea or vomiting and Pain level controlled  Post-op Vital Signs: Reviewed and stable  Complications: No apparent anesthesia complications

## 2013-09-02 NOTE — Transfer of Care (Signed)
Immediate Anesthesia Transfer of Care Note  Patient: Carol [name]  Procedure(s) Performed: Procedure(s): LAPAROSCOPIC CHOLECYSTECTOMY (N/A)  Patient Location: PACU  Anesthesia Type:General  Level of Consciousness: awake, alert  and oriented  Airway & Oxygen Therapy: Patient Spontanous Breathing and Patient connected to nasal cannula oxygen  Post-op Assessment: Report given to PACU RN, Post -op Vital signs reviewed and stable and Patient moving all extremities X 4  Post vital signs: Reviewed and stable  Complications: No apparent anesthesia complications

## 2013-09-02 NOTE — Anesthesia Preprocedure Evaluation (Signed)
Anesthesia Evaluation  Patient identified by MRN, date of birth, ID band Patient awake    Reviewed: Allergy & Precautions, H&P , NPO status , Patient's Chart, lab work & pertinent test results  Airway Mallampati: II TM Distance: >3 FB Neck ROM: full    Dental  (+) Teeth Intact, Dental Advidsory Given   Pulmonary asthma ,  breath sounds clear to auscultation        Cardiovascular negative cardio ROS  Rhythm:regular Rate:Normal     Neuro/Psych  Headaches,  Neuromuscular disease negative neurological ROS  negative psych ROS   GI/Hepatic negative GI ROS, Neg liver ROS, hiatal hernia, GERD-  Controlled,  Endo/Other  negative endocrine ROS  Renal/GU negative Renal ROS     Musculoskeletal   Abdominal   Peds  Hematology   Anesthesia Other Findings   Reproductive/Obstetrics negative OB ROS                           Anesthesia Physical Anesthesia Plan  ASA: II  Anesthesia Plan: General ETT   Post-op Pain Management:    Induction:   Airway Management Planned:   Additional Equipment:   Intra-op Plan:   Post-operative Plan:   Informed Consent: I have reviewed the patients History and Physical, chart, labs and discussed the procedure including the risks, benefits and alternatives for the proposed anesthesia with the patient or authorized representative who has indicated his/her understanding and acceptance.   Dental Advisory Given  Plan Discussed with: Anesthesiologist, CRNA and Surgeon  Anesthesia Plan Comments:         Anesthesia Quick Evaluation

## 2013-09-02 NOTE — Anesthesia Procedure Notes (Signed)
Procedure Name: Intubation Date/Time: 09/02/2013 1:06 PM Performed by: Erik Obey Pre-anesthesia Checklist: Patient identified, Patient being monitored, Emergency Drugs available, Timeout performed and Suction available Patient Re-evaluated:Patient Re-evaluated prior to inductionOxygen Delivery Method: Circle system utilized Preoxygenation: Pre-oxygenation with 100% oxygen Intubation Type: IV induction, Cricoid Pressure applied and Rapid sequence Laryngoscope Size: Mac and 3 Grade View: Grade I Tube type: Oral Tube size: 7.5 mm Number of attempts: 1 Airway Equipment and Method: Stylet Placement Confirmation: ETT inserted through vocal cords under direct vision,  positive ETCO2 and breath sounds checked- equal and bilateral Secured at: 21 cm Tube secured with: Tape Dental Injury: Teeth and Oropharynx as per pre-operative assessment

## 2013-09-02 NOTE — ED Notes (Signed)
Pt has an appointment on 3/16 with [facility] to schedule a cholycystectomy. Pt with abdominal pain, nausea, diarrhea. Denies fever.

## 2013-09-02 NOTE — Preoperative (Signed)
Beta Blockers   Reason not to administer Beta Blockers:Not Applicable 

## 2013-09-02 NOTE — H&P (Signed)
Carol [name] 1951/01/30  [id number].   Primary Care MD: Dr. Susann Givens Chief Complaint/Reason for Consult: gallstones HPI: This is a 63 yo WF with 3-4 weeks of epigastric abdominal pain and nausea.  She saw her PCP for her symptoms about a week ago and he sent her for an ultrasound that revealed gallstones.  She has started having worsening pain over the last 3 days, increase in abdominal bloating, belching, and nausea.  No emesis.  She was supposed to see Dr. Carolynne Edouard on Monday, but due to worsening pain, she came to the [facility] today for evaluation.  Her labs are all normal.  We have been asked to see for admission.   ROS: Please see HPI, otherwise all other systems are negative  Family History  Problem Relation Age of Onset  . Cancer Father     Past Medical History  Diagnosis Date  . GERD (gastroesophageal reflux disease)   . Herpes simplex 1976  . History of basal cell carcinoma 2008    over right eye  . Migraine teens, returned in 40"s  . Right ureteral stone   . History of kidney stones 11/06  . H/O hiatal hernia   . Exercise-induced asthma 30's  . Seasonal allergic rhinitis   . Environmental allergies   . Hyperlipidemia   . Chronic kidney disease     kidney stones  . Anxiety     claustrophobic  . Arthritis 2012    neck, hands    Past Surgical History  Procedure Laterality Date  . Posterior pinnacle mesh sacrospinous repair  11-22-2010    RECURRENT ENTEROCELE  . Vault suspension (dermal graft)/ enterocele and rectocele repair  12-05-2005    VAULT PROLAPSE/ ENTERCELE/ RECTOCELE  . Abdominal hysterectomy  1992  . Right ureteroscopic stone extraction w/ stent placement  08-12-2011    x2 stents  . Cystoscopy/retrograde/ureteroscopy/stone extraction with basket  10/21/2011    Procedure: CYSTOSCOPY/RETROGRADE/URETEROSCOPY/STONE EXTRACTION WITH BASKET;  Surgeon: Kathi Ludwig, MD;  Location: [facility];  Service: Urology;  Laterality: Right;  .  Cystoscopy/retrograde/ureteroscopy  11/18/2011    Procedure: CYSTOSCOPY/RETROGRADE/URETEROSCOPY;  Surgeon: Kathi Ludwig, MD;  Location: [facility];  Service: Urology;  Laterality: Right;  . Cystoscopy w/ ureteral stent placement  03/05/2012    Procedure: CYSTOSCOPY WITH RETROGRADE PYELOGRAM/URETERAL STENT PLACEMENT;  Surgeon: Lindaann Slough, MD;  Location: [facility];  Service: Urology;  Laterality: Right;  . Cystoscopy/retrograde/ureteroscopy/stone extraction with basket Right 09/10/2012    Procedure: RIGHT URETEROSCOPY/STONE EXTRACTION/LASER LITHOTRIPSY/POSSIBLE DOUBLE J STENT PLACEMENT;  Surgeon: Kathi Ludwig, MD;  Location: [facility];  Service: Urology;  Laterality: Right;  . Cesarean section  1981    Social History:  reports that she has never smoked. She has never used smokeless tobacco. She reports that she drinks about 0.5 ounces of alcohol per week. She reports that she does not use illicit drugs.  Allergies:  Allergies  Allergen Reactions  . Iohexol Anaphylaxis     Code: HIVES, Desc: hives during ivp '96, ok w/ 13 hr prep @ [facility] Onset Date: [id number]   Desc: hives   . Ivp Dye [Iodinated Diagnostic Agents] Anaphylaxis  . Codeine Nausea And Vomiting  . Onion Other (See Comments)    Red Onion Only - Nausea, Vomiting and Passes Out     (Not in a hospital admission)  Blood pressure 114/78, pulse 74, temperature 98.1 F (36.7 C), temperature source Oral, resp. rate 16, SpO2 96.00%. Physical Exam: General: pleasant, WD, WN white female who  is laying in bed in NAD HEENT: head is normocephalic, atraumatic.  Sclera are noninjected.  PERRL.  Ears and nose without any masses or lesions.  Mouth is pink and moist Heart: regular, rate, and rhythm.  Normal s1,s2. No obvious murmurs, gallops, or rubs noted.  Palpable radial and pedal pulses bilaterally Lungs: CTAB, no wheezes, rhonchi, or rales noted.  Respiratory effort nonlabored Abd: soft, tender in  epigastrium, no RUQ tenderness or murphy's sign, ND, +BS, no masses, hernias, or organomegaly MS: all 4 extremities are symmetrical with no cyanosis, clubbing, or edema. Skin: warm and dry with no masses, lesions, or rashes Psych: A&Ox3 with an appropriate affect.    Results for orders placed during the hospital encounter of 09/02/13 (from the past 48 hour(s))  CBC WITH DIFFERENTIAL     Status: None   Collection Time    09/02/13  9:29 AM      Result Value Ref Range   WBC 7.1  4.0 - 10.5 K/uL   RBC 5.07  3.87 - 5.11 MIL/uL   Hemoglobin 15.0  12.0 - 15.0 g/dL   HCT 74.2  59.5 - 63.8 %   MCV 86.4  78.0 - 100.0 fL   MCH 29.6  26.0 - 34.0 pg   MCHC 34.2  30.0 - 36.0 g/dL   RDW 75.6  43.3 - 29.5 %   Platelets 244  150 - 400 K/uL   Neutrophils Relative % 65  43 - 77 %   Neutro Abs 4.6  1.7 - 7.7 K/uL   Lymphocytes Relative 23  12 - 46 %   Lymphs Abs 1.7  0.7 - 4.0 K/uL   Monocytes Relative 7  3 - 12 %   Monocytes Absolute 0.5  0.1 - 1.0 K/uL   Eosinophils Relative 5  0 - 5 %   Eosinophils Absolute 0.3  0.0 - 0.7 K/uL   Basophils Relative 1  0 - 1 %   Basophils Absolute 0.0  0.0 - 0.1 K/uL  COMPREHENSIVE METABOLIC PANEL     Status: Abnormal   Collection Time    09/02/13  9:29 AM      Result Value Ref Range   Sodium 142  137 - 147 mEq/L   Potassium 4.2  3.7 - 5.3 mEq/L   Chloride 104  96 - 112 mEq/L   CO2 22  19 - 32 mEq/L   Glucose, Bld 101 (*) 70 - 99 mg/dL   BUN 21  6 - 23 mg/dL   Creatinine, Ser 1.88  0.50 - 1.10 mg/dL   Calcium 9.2  8.4 - 41.6 mg/dL   Total Protein 7.2  6.0 - 8.3 g/dL   Albumin 4.2  3.5 - 5.2 g/dL   AST 21  0 - 37 U/L   ALT 20  0 - 35 U/L   Alkaline Phosphatase 66  39 - 117 U/L   Total Bilirubin 0.3  0.3 - 1.2 mg/dL   GFR calc non Af Amer >90  >90 mL/min   GFR calc Af Amer >90  >90 mL/min   Comment: (NOTE)     The eGFR has been calculated using the CKD EPI equation.     This calculation has not been validated in all clinical situations.     eGFR's  persistently <90 mL/min signify possible Chronic Kidney     Disease.  LIPASE, BLOOD     Status: None   Collection Time    09/02/13  9:29 AM  Result Value Ref Range   Lipase 20  11 - 59 U/L  TROPONIN I     Status: None   Collection Time    09/02/13  9:29 AM      Result Value Ref Range   Troponin I <0.30  <0.30 ng/mL   Comment:            Due to the release kinetics of cTnI,     a negative result within the first hours     of the onset of symptoms does not rule out     myocardial infarction with certainty.     If myocardial infarction is still suspected,     repeat the test at appropriate intervals.  URINALYSIS, ROUTINE W REFLEX MICROSCOPIC     Status: Abnormal   Collection Time    09/02/13  9:38 AM      Result Value Ref Range   Color, Urine YELLOW  YELLOW   APPearance HAZY (*) CLEAR   Specific Gravity, Urine 1.013  1.005 - 1.030   pH 7.0  5.0 - 8.0   Glucose, UA NEGATIVE  NEGATIVE mg/dL   Hgb urine dipstick NEGATIVE  NEGATIVE   Bilirubin Urine NEGATIVE  NEGATIVE   Ketones, ur NEGATIVE  NEGATIVE mg/dL   Protein, ur NEGATIVE  NEGATIVE mg/dL   Urobilinogen, UA 0.2  0.0 - 1.0 mg/dL   Nitrite NEGATIVE  NEGATIVE   Leukocytes, UA NEGATIVE  NEGATIVE   Comment: MICROSCOPIC NOT DONE ON URINES WITH NEGATIVE PROTEIN, BLOOD, LEUKOCYTES, NITRITE, OR GLUCOSE <1000 mg/dL.  Rosezena Sensor, ED     Status: None   Collection Time    09/02/13  9:50 AM      Result Value Ref Range   Troponin i, poc 0.00  0.00 - 0.08 ng/mL   Comment 3            Comment: Due to the release kinetics of cTnI,     a negative result within the first hours     of the onset of symptoms does not rule out     myocardial infarction with certainty.     If myocardial infarction is still suspected,     repeat the test at appropriate intervals.   No results found.     Assessment/Plan 1. Symptomatic cholelithiasis 2. Hyperlipidemia  Plan: 1. We will admit the patient for lap chole.  IVFs, abx on call to or,  and meds prn nausea and pain. 2. The procedure, risks, complications, and expected outcome were discussed with the patient and her partner.  They both understand and agree to proceed.  Varian Innes E 09/02/2013, 11:51 AM Pager: [phone]

## 2013-09-02 NOTE — ED Notes (Signed)
OR RN informed pt needs Cipro and Fluids.

## 2013-09-02 NOTE — Telephone Encounter (Signed)
Pt was scheduled to see Dr. Marlou Starks on 09/05/13 for gallstones.  She calls this morning in extreme pain.  I advised her to go to the ED and be evaluated.  The patient agreed.

## 2013-09-02 NOTE — Op Note (Signed)
Laparoscopic Cholecystectomy Procedure Note  Indications: This patient presents with symptomatic gallbladder disease and will undergo laparoscopic cholecystectomy.  Pre-operative Diagnosis: Calculus of gallbladder without mention of cholecystitis or obstruction  Post-operative Diagnosis: Same  Surgeon: Coralie Keens A   Assistants: 0  Anesthesia: General endotracheal anesthesia  ASA Class: 2  Procedure Details  The patient was seen again in the Holding Room. The risks, benefits, complications, treatment options, and expected outcomes were discussed with the patient. The possibilities of reaction to medication, pulmonary aspiration, perforation of viscus, bleeding, recurrent infection, finding a normal gallbladder, the need for additional procedures, failure to diagnose a condition, the possible need to convert to an open procedure, and creating a complication requiring transfusion or operation were discussed with the patient. The likelihood of improving the patient's symptoms with return to their baseline status is good.  The patient and/or family concurred with the proposed plan, giving informed consent. The site of surgery properly noted. The patient was taken to Operating Room, identified as Carol [name] and the procedure verified as Laparoscopic Cholecystectomy with Intraoperative Cholangiogram. A Time Out was held and the above information confirmed.  Prior to the induction of general anesthesia, antibiotic prophylaxis was administered. General endotracheal anesthesia was then administered and tolerated well. After the induction, the abdomen was prepped with Chloraprep and draped in sterile fashion. The patient was positioned in the supine position.  Local anesthetic agent was injected into the skin near the umbilicus and an incision made. We dissected down to the abdominal fascia with blunt dissection.  The fascia was incised vertically and we entered the peritoneal cavity bluntly.   A pursestring suture of 0-Vicryl was placed around the fascial opening.  The Hasson cannula was inserted and secured with the stay suture.  Pneumoperitoneum was then created with CO2 and tolerated well without any adverse changes in the patient's vital signs. An 11-mm port was placed in the subxiphoid position.  Two 5-mm ports were placed in the right upper quadrant. All skin incisions were infiltrated with a local anesthetic agent before making the incision and placing the trocars.   We positioned the patient in reverse Trendelenburg, tilted slightly to the patient's left.  The gallbladder was identified, the fundus grasped and retracted cephalad. Adhesions were lysed bluntly and with the electrocautery where indicated, taking care not to injure any adjacent organs or viscus. The infundibulum was grasped and retracted laterally, exposing the peritoneum overlying the triangle of Calot. This was then divided and exposed in a blunt fashion. The cystic duct was clearly identified and bluntly dissected circumferentially. A critical view of the cystic duct and cystic artery was obtained.  The cystic duct was then ligated with clips and divided. The cystic artery was, dissected free, ligated with clips and divided as well.   The gallbladder was dissected from the liver bed in retrograde fashion with the electrocautery. The gallbladder was removed and placed in an Endocatch sac. The liver bed was irrigated and inspected. Hemostasis was achieved with the electrocautery. Copious irrigation was utilized and was repeatedly aspirated until clear.  The gallbladder and Endocatch sac were then removed through the umbilical port site.  The pursestring suture was used to close the umbilical fascia.    We again inspected the right upper quadrant for hemostasis.  Pneumoperitoneum was released as we removed the trocars.  4-0 Monocryl was used to close the skin.   Benzoin, steri-strips, and clean dressings were applied. The  patient was then extubated and brought to the recovery room  in stable condition. Instrument, sponge, and needle counts were correct at closure and at the conclusion of the case.   Findings: Chronic Cholecystitis with Cholelithiasis  Estimated Blood Loss: Minimal         Drains: 0         Specimens: Gallbladder           Complications: None; patient tolerated the procedure well.         Disposition: PACU - hemodynamically stable.         Condition: stable

## 2013-09-03 DIAGNOSIS — Z9049 Acquired absence of other specified parts of digestive tract: Secondary | ICD-10-CM

## 2013-09-03 MED ORDER — OXYCODONE-ACETAMINOPHEN 5-325 MG PO TABS: 1.0000 | ORAL_TABLET | ORAL | Status: DC | PRN

## 2013-09-03 NOTE — Progress Notes (Signed)
Patient discharged to home with instructions. 

## 2013-09-03 NOTE — Discharge Summary (Signed)
Physician Discharge Summary  Patient ID: Carol [name] MRN: [id number] DOB/AGE: July 23, 1950 63 y.o.  Admit date: 09/02/2013 Discharge date: 09/03/2013  Admission Diagnoses:  cholecystitis  Discharge Diagnoses:  same  Active Problems:   S/P laparoscopic cholecystectomy March 2015   Surgery:  Lap chole  Discharged Condition: improved  Hospital Course:   Had surgery by Dr. Ninfa Linden.  Doing well and ready for discharge  Consults: none  Significant Diagnostic Studies: none    Discharge Exam: Blood pressure 113/63, pulse 85, temperature 98.7 F (37.1 C), temperature source Oral, resp. rate 15, height 5\' 1"  (1.549 m), weight 154 lb 15.7 oz (70.3 kg), SpO2 97.00%. Incisions OK.  Minimal pain  Disposition: 01-Home or Self Care  Discharge Orders   Future Appointments Provider Department Dept Phone   02/23/2014 9:00 AM [place] Cage, [place] [phone]   Future Orders Complete By Expires   Diet - low sodium heart healthy  As directed    Discharge instructions  As directed    Comments:     Advance diet as tolerated   Discharge wound care:  As directed    Comments:     May shower   Increase activity slowly  As directed        Medication List         ALEVE 220 MG tablet  Generic drug:  naproxen sodium  Take 220 mg by mouth 2 (two) times daily.     ASMANEX 120 METERED DOSES 220 MCG/INH inhaler  Generic drug:  mometasone  INHALE 1 PUFF AS DIRECTED TWICE A DAY     aspirin EC 81 MG tablet  Take 81 mg by mouth daily.     atorvastatin 20 MG tablet  Commonly known as:  LIPITOR  TAKE 1 TABLET ONCE DAILY.     dexlansoprazole 60 MG capsule  Commonly known as:  DEXILANT  Take 60 mg by mouth every morning.     docusate sodium 100 MG capsule  Commonly known as:  COLACE  Take 200 mg by mouth 2 (two) times daily.     estradiol 0.05 MG/24HR patch  Commonly known as:  VIVELLE-DOT  APPLY ONE PATCH TWICE A WEEK, CHANGE SITES AND REAPPLY AS  DIRECTED.     methocarbamol 500 MG tablet  Commonly known as:  ROBAXIN  Take 500 mg by mouth as needed for muscle spasms.     MULTI-VITAMIN PO  Take 1 tablet by mouth daily.     oxyCODONE-acetaminophen 5-325 MG per tablet  Commonly known as:  PERCOCET/ROXICET  Take 1-2 tablets by mouth every 4 (four) hours as needed for moderate pain.     rizatriptan 10 MG tablet  Commonly known as:  MAXALT  Take 10 mg by mouth as needed for migraine. May repeat in 2 hours if needed     valACYclovir 500 MG tablet  Commonly known as:  VALTREX  Take 1 tablet (500 mg total) by mouth daily as needed (For outbreaks.).     Vitamin D (Ergocalciferol) 50000 UNITS Caps capsule  Commonly known as:  DRISDOL  Take 1 capsule (50,000 Units total) by mouth every Sunday.     zonisamide 100 MG capsule  Commonly known as:  ZONEGRAN  Take 200 mg by mouth at bedtime.           Follow-up Information   Follow up with [facility], MD. Schedule an appointment as soon as possible for a visit in 3 weeks.   Specialty:  General Surgery  Contact information:   [address] [place] [address] [phone]       Signed: Pedro Earls 09/03/2013, 9:26 AM

## 2013-09-03 NOTE — Discharge Instructions (Signed)
Laparoscopic Cholecystectomy °Laparoscopic cholecystectomy is surgery to remove the gallbladder. The gallbladder is located in the upper right part of the abdomen, behind the liver. It is a storage sac for bile produced in the liver. Bile aids in the digestion and absorption of fats. Cholecystectomy is often done for inflammation of the gallbladder (cholecystitis). This condition is usually caused by a buildup of gallstones (cholelithiasis) in your gallbladder. Gallstones can block the flow of bile, resulting in inflammation and pain. In severe cases, emergency surgery may be required. When emergency surgery is not required, you will have time to prepare for the procedure. °Laparoscopic surgery is an alternative to open surgery. Laparoscopic surgery has a shorter recovery time. Your common bile duct may also need to be examined during the procedure. If stones are found in the common bile duct, they may be removed. °LET YOUR HEALTH CARE PROVIDER KNOW ABOUT: °· Any allergies you have. °· All medicines you are taking, including vitamins, herbs, eye drops, creams, and over-the-counter medicines. °· Previous problems you or members of your family have had with the use of anesthetics. °· Any blood disorders you have. °· Previous surgeries you have had. °· Medical conditions you have. °RISKS AND COMPLICATIONS °Generally, this is a safe procedure. However, as with any procedure, complications can occur. Possible complications include: °· Infection. °· Damage to the common bile duct, nerves, arteries, veins, or other internal organs such as the stomach, liver, or intestines. °· Bleeding. °· A stone may remain in the common bile duct. °· A bile leak from the cyst duct that is clipped when your gallbladder is removed. °· The need to convert to open surgery, which requires a larger incision in the abdomen. This may be necessary if your surgeon thinks it is not safe to continue with a laparoscopic procedure. °BEFORE THE  PROCEDURE °· Ask your health care provider about changing or stopping any regular medicines. You will need to stop taking aspirin or blood thinners at least 5 days prior to surgery. °· Do not eat or drink anything after midnight the night before surgery. °· Let your health care provider know if you develop a cold or other infectious problem before surgery. °PROCEDURE  °· You will be given medicine to make you sleep through the procedure (general anesthetic). A breathing tube will be placed in your mouth. °· When you are asleep, your surgeon will make several small cuts (incisions) in your abdomen. °· A thin, lighted tube with a tiny camera on the end (laparoscope) is inserted through one of the small incisions. The camera on the laparoscope sends a picture to a TV screen in the operating room. This gives the surgeon a good view inside your abdomen. °· A gas will be pumped into your abdomen. This expands your abdomen so that the surgeon has more room to perform the surgery. °· Other tools needed for the procedure are inserted through the other incisions. The gallbladder is removed through one of the incisions. °· After the removal of your gallbladder, the incisions will be closed with stitches, staples, or skin glue. °AFTER THE PROCEDURE °· You will be taken to a recovery area where your progress will be checked often. °· You may be allowed to go home the same day if your pain is controlled and you can tolerate liquids. °Document Released: 06/09/2005 Document Revised: 03/30/2013 Document Reviewed: 01/19/2013 °[employer]® Patient Information ©2014 [employer]. ° °

## 2013-09-05 ENCOUNTER — Ambulatory Visit (INDEPENDENT_AMBULATORY_CARE_PROVIDER_SITE_OTHER): Payer: BC Managed Care – PPO | Admitting: General Surgery

## 2013-09-05 NOTE — ED Provider Notes (Signed)
Medical screening examination/treatment/procedure(s) were performed by non-physician practitioner and as supervising physician I was immediately available for consultation/collaboration.   Garyson Stelly T Quenisha Lovins, MD 09/05/13 0738 

## 2013-09-06 ENCOUNTER — Encounter (HOSPITAL_COMMUNITY): Payer: Self-pay | Admitting: Surgery

## 2013-09-28 ENCOUNTER — Ambulatory Visit (INDEPENDENT_AMBULATORY_CARE_PROVIDER_SITE_OTHER): Payer: BC Managed Care – PPO | Admitting: Family Medicine

## 2013-09-28 ENCOUNTER — Encounter: Payer: Self-pay | Admitting: Family Medicine

## 2013-09-28 VITALS — BP 104/70 | HR 64 | Wt 157.0 lb

## 2013-09-28 DIAGNOSIS — L309 Dermatitis, unspecified: Secondary | ICD-10-CM

## 2013-09-28 DIAGNOSIS — L259 Unspecified contact dermatitis, unspecified cause: Secondary | ICD-10-CM

## 2013-09-28 DIAGNOSIS — M79672 Pain in left foot: Secondary | ICD-10-CM

## 2013-09-28 DIAGNOSIS — M79609 Pain in unspecified limb: Secondary | ICD-10-CM

## 2013-09-28 NOTE — Progress Notes (Signed)
   Subjective:    Patient ID: Carol [name], female    DOB: March 22, 1951, 63 y.o.   MRN: [id number]  CC: "I have a new rash under my arms"  HPI: The patient states that she has a worsening rash under her arms bilaterally. The rash has been getting worse since it first appeared in mid December and has recently become painful. The patient describes the pain as a prickling and burning sensation, "like repeatedly rubbing a raw spot." The patient denies any changes in deodorant or detergent. The patient has tried using her previously prescribed eczema cream on the spots, however they have not improved.  The patient also notes a new swelling in her foot. The swelling is located on the dorsum of the foot, at the base of the first and second toes. The pain and swelling are significant;y worse in the morning and resolve with use through the day. The patient denies any significant changes in ambulation or balance.   Review of Systems negative, except per HPI.      Objective:   Physical Exam  Axilla: 4 cm erythematous round area seen bilaterally on the lateral most aspect of the flexure. No papules noted. Foot: Normal in gross appearance without erythema or swelling. Normal ROM of all joints with no tenderness to palpation at the joint. Sensitive to pressure between the first and second toe. Pain is also elicited with flexion of the distal aspect of the foot.      Assessment & Plan:    1) Dermatitis: Given the benign appearance, bilateral symmetry and description of symptoms, this most likely represents a local area of friction and thus irritation. Recommend corstarch and cortisone cream.  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 If no improvement, will make a referral to orthopedics.

## 2013-09-28 NOTE — Patient Instructions (Signed)
Recommend cornstarch and cortisone cream. Also recommended space return the first and second toe to see if this will help with symptoms if that does not work, orthopedic referral will be made.

## 2013-09-28 NOTE — Progress Notes (Deleted)
   Subjective:    Patient ID: Lavell Anchors, female    DOB: 1950-12-04, 63 y.o.   MRN: [id number]  CC: "I have a new rash under my arms"  HPI: The patient states that she has a worsening rash under her arms bilaterally. The rash has been getting worse since it first appeared in mid December and has recently become painful. The patient describes the pain as a prickling and burning sensation, "like repeatedly rubbing a raw spot." The patient denies any changes in deodorant or detergent. The patient has tried using her previously prescribed eczema cream on the spots, however they have not improved.  The patient also notes a new swelling in her foot. The swelling is located on the dorsum of the foot, at the base of the first and second toes. The pain and swelling are significant;y worse in the morning and resolve with use through the day. The patient denies any significant changes in ambulation or balance.   Review of Systems negative, except per HPI.      Objective:   Physical Exam  Axilla: 4-5 cm erythematous round area seen bilaterally on the lateral most aspect of the flexure. No papules noted. Foot: Normal in gross appearance without erythema or swelling. Normal ROM of all joints with no tenderness to palpation at the joint. Sensitive to pressure between the first and second toe. Pain is also elicited with flexion of the distal aspect of the foot.      Assessment & Plan:   No diagnosis found.  1) Dermatitis: Given the benign appearance, bilateral symmetry and description of symptoms, this most likely represents a local area of friction and thus irritation. Recommend corstarch and cortisone cream.  2) Morton's Neuroma: Recommend spacer between first and second toe. If no improvement, will make a referral to orthopedics.

## 2013-10-04 ENCOUNTER — Ambulatory Visit (INDEPENDENT_AMBULATORY_CARE_PROVIDER_SITE_OTHER): Payer: BC Managed Care – PPO | Admitting: Surgery

## 2013-10-04 VITALS — BP 110/72 | HR 80 | Temp 97.8°F | Resp 14 | Wt 156.0 lb

## 2013-10-04 DIAGNOSIS — Z09 Encounter for follow-up examination after completed treatment for conditions other than malignant neoplasm: Secondary | ICD-10-CM

## 2013-10-04 NOTE — Progress Notes (Signed)
Subjective:     Patient ID: Carol [name], female   DOB: 04-Apr-1951, 63 y.o.   MRN: [id number]  HPI She is here for her first postop visit status post laparoscopic cholecystectomy. She is doing well and has no complaints. She is eating well and moved her bowels well.  Review of Systems     Objective:   Physical Exam On exam, her incisions are well-healed. Final pathology showed chronic cholecystitis with gallstones    Assessment:     Patient stable postop     Plan:     She may resume her normal activity. I will see her back as needed

## 2013-10-09 ENCOUNTER — Other Ambulatory Visit: Payer: Self-pay | Admitting: Family Medicine

## 2013-10-10 NOTE — Telephone Encounter (Signed)
Is this okay to refill? 

## 2013-10-14 LAB — HM DEXA SCAN: HM DEXA SCAN: NORMAL

## 2013-11-02 ENCOUNTER — Telehealth: Payer: Self-pay | Admitting: Family Medicine

## 2013-11-02 MED ORDER — ALBUTEROL SULFATE HFA 108 (90 BASE) MCG/ACT IN AERS: 2.0000 | INHALATION_SPRAY | Freq: Four times a day (QID) | RESPIRATORY_TRACT | Status: DC | PRN

## 2013-11-02 NOTE — Telephone Encounter (Signed)
Needs proair Rx sent to [facility]  (pt is changing pharmacies)

## 2013-12-06 ENCOUNTER — Ambulatory Visit (INDEPENDENT_AMBULATORY_CARE_PROVIDER_SITE_OTHER): Payer: BC Managed Care – PPO | Admitting: Family Medicine

## 2013-12-06 ENCOUNTER — Encounter: Payer: Self-pay | Admitting: Family Medicine

## 2013-12-06 VITALS — BP 120/72 | HR 72 | Wt 158.0 lb

## 2013-12-06 DIAGNOSIS — R201 Hypoesthesia of skin: Secondary | ICD-10-CM

## 2013-12-06 DIAGNOSIS — R209 Unspecified disturbances of skin sensation: Secondary | ICD-10-CM

## 2013-12-06 NOTE — Patient Instructions (Signed)
Right now we will do nothing but keep track of the symptoms and if anything new comes up let me know

## 2013-12-06 NOTE — Progress Notes (Signed)
   Subjective:    Patient ID: Carol [name], female    DOB: May 16, 1951, 63 y.o.   MRN: [id number]  HPI She has a three-week history of numbness and tingling of her hands, left face, arms and left leg. No visual changes. She does feel weak in her arms but has not dropped anything. Still has fine motor skills. The symptoms tend to come and go but never completely go away. Her spouse had surgery 2 months ago and she has been the primary caregiver   Review of Systems     Objective:   Physical Exam Alert and in no distress. EOMI. Other cranial nerves intact. Normal motor, sensory and DTRs of her arms. Cardiac and lung exam normal.       Assessment & Plan:  Hypesthesia  I explained that she has a tingling sensation but her neurologic exam is entirely normal. Recommend watchful waiting and if she has any change in her symptoms, let me know

## 2013-12-25 ENCOUNTER — Other Ambulatory Visit: Payer: Self-pay | Admitting: Family Medicine

## 2014-02-23 ENCOUNTER — Ambulatory Visit (INDEPENDENT_AMBULATORY_CARE_PROVIDER_SITE_OTHER): Payer: BC Managed Care – PPO | Admitting: Nurse Practitioner

## 2014-02-23 ENCOUNTER — Encounter: Payer: Self-pay | Admitting: Nurse Practitioner

## 2014-02-23 VITALS — BP 114/72 | HR 80 | Ht 61.0 in | Wt 161.0 lb

## 2014-02-23 DIAGNOSIS — Z01419 Encounter for gynecological examination (general) (routine) without abnormal findings: Secondary | ICD-10-CM

## 2014-02-23 DIAGNOSIS — E559 Vitamin D deficiency, unspecified: Secondary | ICD-10-CM

## 2014-02-23 DIAGNOSIS — Z0000 Encounter for general adult medical examination without abnormal findings: Secondary | ICD-10-CM

## 2014-02-23 DIAGNOSIS — R42 Dizziness and giddiness: Secondary | ICD-10-CM

## 2014-02-23 LAB — TSH: TSH: 1.862 u[IU]/mL (ref 0.350–4.500)

## 2014-02-23 LAB — POCT URINALYSIS DIPSTICK
Bilirubin, UA: NEGATIVE
GLUCOSE UA: NEGATIVE
Ketones, UA: NEGATIVE
LEUKOCYTES UA: NEGATIVE
NITRITE UA: NEGATIVE
Protein, UA: NEGATIVE
RBC UA: NEGATIVE
UROBILINOGEN UA: NEGATIVE
pH, UA: 6

## 2014-02-23 LAB — LIPID PANEL
Cholesterol: 164 mg/dL (ref 0–200)
HDL: 60 mg/dL (ref >39)
LDL CALC: 73 mg/dL (ref 0–99)
Total CHOL/HDL Ratio: 2.7 Ratio
Triglycerides: 157 mg/dL — ABNORMAL HIGH (ref <150)
VLDL: 31 mg/dL (ref 0–40)

## 2014-02-23 LAB — HEMOGLOBIN, FINGERSTICK: HEMOGLOBIN, FINGERSTICK: 14 g/dL (ref 12.0–16.0)

## 2014-02-23 MED ORDER — ESTRADIOL 0.05 MG/24HR TD PTTW: MEDICATED_PATCH | TRANSDERMAL | Status: DC

## 2014-02-23 MED ORDER — VALACYCLOVIR HCL 500 MG PO TABS: ORAL_TABLET | ORAL | Status: DC

## 2014-02-23 MED ORDER — MECLIZINE HCL 25 MG PO TABS: 25.0000 mg | ORAL_TABLET | Freq: Three times a day (TID) | ORAL | Status: DC | PRN

## 2014-02-23 MED ORDER — VITAMIN D (ERGOCALCIFEROL) 1.25 MG (50000 UNIT) PO CAPS: 50000.0000 [IU] | ORAL_CAPSULE | ORAL | Status: DC

## 2014-02-23 NOTE — Patient Instructions (Signed)

## 2014-02-23 NOTE — Progress Notes (Signed)
Patient ID: Carol [name], female   DOB: 22-Jun-1951, 63 y.o.   MRN: [id number] 63 y.o. [id number] Married Caucasian Fe here for annual exam. She ius doing well on Vivelle patches and vaso symptoms for the most part are controlled.  GB removed 3/13 15. Still some tenderness at umbilicus.  This am new onset of dizziness - not true vertigo.  She has had this in the past., but it has been awhile.   Out of Antivert.  She has no associated headaches, numbness, URI symptoms, hearing changes, weakness, palpitations, fever or chills.   Patient's last menstrual period was 06/23/1990.          Sexually active: yes - female partner - now married The current method of family planning is status post hysterectomy.  Exercising: yes walking everyday, weights and cardio 1-2 times per week Smoker: no   Health Maintenance:  Pap: 12/30/01, WNL, hyst MMG: 04/13/13, ultrasound and diagnostic; Bi-Rads 2: benign findings; repeat in one year Colonoscopy: 05/14/12, 4 polyps, repeat 5 years  BMD: 03/28/13, -1.6/1.2 left femur TDaP: 2010  Labs:  HB:  14.0  Urine:  Negative    reports that she has never smoked. She has never used smokeless tobacco. She reports that she drinks about .6 - 1.2 ounces of alcohol per week. She reports that she does not use illicit drugs.  Past Medical History  Diagnosis Date  . GERD (gastroesophageal reflux disease)   . Herpes simplex 1976  . Right ureteral stone   . H/O hiatal hernia   . Exercise-induced asthma 30's  . Seasonal allergic rhinitis   . Environmental allergies   . Hyperlipidemia   . Anxiety     claustrophobic  . Basal cell carcinoma of forehead 2008; 2014  . Kidney stones   . Migraine teens, returned in 40"s    "depends on the change in the weather; last one was 1 wk ago" (09/02/2013)  . Arthritis     "neck, hands" (09/02/2013)    Past Surgical History  Procedure Laterality Date  . Posterior pinnacle mesh sacrospinous repair  11-22-2010    RECURRENT ENTEROCELE  .  Vault suspension (dermal graft)/ enterocele and rectocele repair  12-05-2005    VAULT PROLAPSE/ ENTERCELE/ RECTOCELE  . Abdominal hysterectomy  1992  . Right ureteroscopic stone extraction w/ stent placement  08-12-2011    x2 stents  . Cystoscopy/retrograde/ureteroscopy/stone extraction with basket  10/21/2011    Procedure: CYSTOSCOPY/RETROGRADE/URETEROSCOPY/STONE EXTRACTION WITH BASKET;  Surgeon: Ailene Rud, MD;  Location: [facility];  Service: Urology;  Laterality: Right;  . Cystoscopy/retrograde/ureteroscopy  11/18/2011    Procedure: CYSTOSCOPY/RETROGRADE/URETEROSCOPY;  Surgeon: Ailene Rud, MD;  Location: [facility];  Service: Urology;  Laterality: Right;  . Cystoscopy w/ ureteral stent placement  03/05/2012    Procedure: CYSTOSCOPY WITH RETROGRADE PYELOGRAM/URETERAL STENT PLACEMENT;  Surgeon: Hanley Ben, MD;  Location: [place];  Service: Urology;  Laterality: Right;  . Cystoscopy/retrograde/ureteroscopy/stone extraction with basket Right 09/10/2012    Procedure: RIGHT URETEROSCOPY/STONE EXTRACTION/LASER LITHOTRIPSY/POSSIBLE DOUBLE J STENT PLACEMENT;  Surgeon: Ailene Rud, MD;  Location: [facility];  Service: Urology;  Laterality: Right;  . Cesarean section  1981  . Tubal ligation  1991  . Mohs surgery  2008; 2014  . Cholecystectomy N/A 09/02/2013    Procedure: LAPAROSCOPIC CHOLECYSTECTOMY;  Surgeon: Harl Bowie, MD;  Location: [place];  Service: General;  Laterality: N/A;    Current Outpatient Prescriptions  Medication Sig Dispense Refill  . albuterol (PROVENTIL HFA;VENTOLIN HFA)  108 (90 BASE) MCG/ACT inhaler Inhale 2 puffs into the lungs every 6 (six) hours as needed for wheezing or shortness of breath.  1 Inhaler  1  . ASMANEX 120 METERED DOSES 220 MCG/INH inhaler INHALE 1 PUFF AS DIRECTED TWICE A DAY  1 Inhaler  5  . aspirin EC 81 MG tablet Take 81 mg by mouth daily.      Marland Kitchen atorvastatin (LIPITOR) 20 MG tablet TAKE 1 TABLET  ONCE DAILY.  30 tablet  5  . dexlansoprazole (DEXILANT) 60 MG capsule Take 60 mg by mouth every morning.       . docusate sodium (COLACE) 100 MG capsule Take 200 mg by mouth 2 (two) times daily.        Marland Kitchen estradiol (VIVELLE-DOT) 0.05 MG/24HR patch APPLY ONE PATCH TWICE A WEEK, CHANGE SITES AND REAPPLY AS DIRECTED.  24 patch  3  . methocarbamol (ROBAXIN) 500 MG tablet Take 500 mg by mouth as needed for muscle spasms.      . Multiple Vitamin (MULTI-VITAMIN [name]) Take 1 tablet by mouth daily.      . naproxen sodium (ALEVE) 220 MG tablet Take 220 mg by mouth 2 (two) times daily.      . rizatriptan (MAXALT) 10 MG tablet Take 10 mg by mouth as needed for migraine. May repeat in 2 hours if needed      . valACYclovir (VALTREX) 500 MG tablet TAKE 1 TABLET DAILY.  30 tablet  5  . Vitamin D, Ergocalciferol, (DRISDOL) 50000 UNITS CAPS capsule Take 1 capsule (50,000 Units total) by mouth every Sunday.  30 capsule  3  . zonisamide (ZONEGRAN) 100 MG capsule Take 200 mg by mouth at bedtime.       . meclizine (ANTIVERT) 25 MG tablet Take 1 tablet (25 mg total) by mouth 3 (three) times daily as needed for dizziness.  30 tablet  0   No current facility-administered medications for this visit.    Family History  Problem Relation Age of Onset  . Cancer Father     ROS:  Pertinent items are noted in HPI.  Otherwise, a comprehensive ROS was negative.  Exam:   BP 114/72  Pulse 80  Ht 5\' 1"  (1.549 m)  Wt 161 lb (73.029 kg)  BMI 30.44 kg/m2  LMP 06/23/1990 Height: 5\' 1"  (154.9 cm)  Ht Readings from Last 3 Encounters:  02/23/14 5\' 1"  (1.549 m)  09/02/13 5\' 1"  (1.549 m)  09/02/13 5\' 1"  (1.549 m)    General appearance: alert, cooperative and appears stated age Head: Normocephalic, without obvious abnormality, atraumatic Neck: no adenopathy, supple, symmetrical, trachea midline and thyroid normal to inspection and palpation Lungs: clear to auscultation bilaterally Breasts: normal appearance, no masses or  tenderness Heart: regular rate and rhythm Abdomen: soft, non-tender; no masses,  no organomegaly Extremities: extremities normal, atraumatic, no cyanosis or edema Skin: Skin color, texture, turgor normal. No rashes or lesions Lymph nodes: Cervical, supraclavicular, and axillary nodes normal. No abnormal inguinal nodes palpated Neurologic: Grossly normal   Pelvic: External genitalia:  no lesions              Urethra:  normal appearing urethra with no masses, tenderness or lesions              Bartholin's and Skene's: normal                 Vagina: normal appearing vagina with normal color and discharge, no lesions  Cervix: absent              Pap taken: No. Bimanual Exam:  Uterus:  uterus absent              Adnexa: no mass, fullness, tenderness               Rectovaginal: Confirms               Anus:  normal sphincter tone, no lesions  A:  Well Woman with normal exam  S/P TAH secondary to uterine prolapse 1992   History of HSV, Vit D deficiency   History of migraine headaches   History of renal calculi with multiple cysto, lithotripsy   S/P Vault Suspension with graft and mesh 3/07 & 6/12  S/P Lap Chole 3/15  History of dizziness - new onset this am   P:   Reviewed health and wellness pertinent to exam  Pap smear not taken today  Will follow with fasting labs  Mammogram is due next month  Refill Vivelle dot for a year  Counseled with risk of DVT, CVA, cancer, etc.  Refill on Antivert 25 mg 2-3 times a day. Caution with driving. - note for work today and possibly Friday if not feeling better.  If symptoms persist or worsens to seek care with PCP  Refill on Vit D and will follow with labs  Refill on Valtrex to use as directed  Counseled on breast self exam, mammography screening, adequate intake of calcium and vitamin D, diet and exercise return annually or prn  An After Visit Summary was printed and given to the patient.

## 2014-02-24 LAB — VITAMIN D 25 HYDROXY (VIT D DEFICIENCY, FRACTURES): Vit D, 25-Hydroxy: 64 ng/mL (ref 30–89)

## 2014-02-27 NOTE — Progress Notes (Signed)
Encounter reviewed by Dr. Brook Silva.  

## 2014-03-13 ENCOUNTER — Other Ambulatory Visit: Payer: Self-pay

## 2014-03-13 DIAGNOSIS — Z1231 Encounter for screening mammogram for malignant neoplasm of breast: Secondary | ICD-10-CM

## 2014-03-29 ENCOUNTER — Ambulatory Visit
Admission: RE | Admit: 2014-03-29 | Discharge: 2014-03-29 | Disposition: A | Discharge status: Home / Self Care | Payer: BC Managed Care – PPO | Source: Ambulatory Visit

## 2014-03-29 DIAGNOSIS — Z1231 Encounter for screening mammogram for malignant neoplasm of breast: Secondary | ICD-10-CM

## 2014-04-24 ENCOUNTER — Encounter: Payer: Self-pay | Admitting: Nurse Practitioner

## 2014-05-04 ENCOUNTER — Encounter: Payer: Self-pay | Admitting: Family Medicine

## 2014-05-04 ENCOUNTER — Ambulatory Visit (INDEPENDENT_AMBULATORY_CARE_PROVIDER_SITE_OTHER): Payer: BC Managed Care – PPO | Admitting: Family Medicine

## 2014-05-04 VITALS — BP 110/72 | HR 80 | Temp 97.9°F | Ht 61.0 in | Wt 162.0 lb

## 2014-05-04 DIAGNOSIS — Z5181 Encounter for therapeutic drug level monitoring: Secondary | ICD-10-CM

## 2014-05-04 DIAGNOSIS — R5383 Other fatigue: Secondary | ICD-10-CM

## 2014-05-04 DIAGNOSIS — R110 Nausea: Secondary | ICD-10-CM

## 2014-05-04 DIAGNOSIS — R1084 Generalized abdominal pain: Secondary | ICD-10-CM

## 2014-05-04 DIAGNOSIS — R140 Abdominal distension (gaseous): Secondary | ICD-10-CM

## 2014-05-04 LAB — COMPREHENSIVE METABOLIC PANEL
ALBUMIN: 4.1 g/dL (ref 3.5–5.2)
ALK PHOS: 91 U/L (ref 39–117)
ALT: 70 U/L — ABNORMAL HIGH (ref 0–35)
AST: 44 U/L — AB (ref 0–37)
BUN: 17 mg/dL (ref 6–23)
CHLORIDE: 108 meq/L (ref 96–112)
CO2: 22 mEq/L (ref 19–32)
Calcium: 8.7 mg/dL (ref 8.4–10.5)
Creat: 0.64 mg/dL (ref 0.50–1.10)
Glucose, Bld: 95 mg/dL (ref 70–99)
Potassium: 4.2 mEq/L (ref 3.5–5.3)
SODIUM: 139 meq/L (ref 135–145)
Total Bilirubin: 0.3 mg/dL (ref 0.2–1.2)
Total Protein: 6.8 g/dL (ref 6.0–8.3)

## 2014-05-04 LAB — CBC WITH DIFFERENTIAL/PLATELET
BASOS PCT: 0 % (ref 0–1)
Basophils Absolute: 0 10*3/uL (ref 0.0–0.1)
Eosinophils Absolute: 0.1 10*3/uL (ref 0.0–0.7)
Eosinophils Relative: 2 % (ref 0–5)
HCT: 43.7 % (ref 36.0–46.0)
Hemoglobin: 14.8 g/dL (ref 12.0–15.0)
Lymphocytes Relative: 28 % (ref 12–46)
Lymphs Abs: 1.9 10*3/uL (ref 0.7–4.0)
MCH: 28.7 pg (ref 26.0–34.0)
MCHC: 33.9 g/dL (ref 30.0–36.0)
MCV: 84.7 fL (ref 78.0–100.0)
Monocytes Absolute: 0.5 10*3/uL (ref 0.1–1.0)
Monocytes Relative: 8 % (ref 3–12)
NEUTROS PCT: 62 % (ref 43–77)
Neutro Abs: 4.2 10*3/uL (ref 1.7–7.7)
PLATELETS: 252 10*3/uL (ref 150–400)
RBC: 5.16 MIL/uL — ABNORMAL HIGH (ref 3.87–5.11)
RDW: 14.3 % (ref 11.5–15.5)
WBC: 6.8 10*3/uL (ref 4.0–10.5)

## 2014-05-04 LAB — LIPASE: Lipase: 18 U/L (ref 0–75)

## 2014-05-04 MED ORDER — ONDANSETRON 4 MG PO TBDP: 4.0000 mg | ORAL_TABLET | Freq: Three times a day (TID) | ORAL | Status: DC | PRN

## 2014-05-04 NOTE — Progress Notes (Signed)
Chief Complaint  Patient presents with  . Bloated    since Sunday, after she eats she feels bloated. She feels nauseous and like she could vomit at any given time. No energy-feels so lethargic.    Last week she "didn't feel well"--achey mainly across her whole stomach.  Sunday, her abdomen "swelled up bad", looked like she was 8 months pregnant.  Felt like her skin was stretched, going to pop.  She noticed this after breakfast (bacon, biscuit and coffee).  She had some mild nausea at first, which got worse, along with the swelling, as the day progressed.  Whenever she tried to eat, she felt like it was "just sitting" in her lower chest.  Bowels have been normal, going 2x in the morning.  Stools are slightly loose, as per her norm, and feels like she is emptying well.  She is having heartburn despite taking Dexilant daily--much more than usual.  She is getting heartburn after eating anything.  Today all she has eaten is 1/2 poptart and 1/2 cup coffee, and having indigestion.  Not using any OTC meds for heartburn.  Denies dysphagia. If she leans forward, she feels like she will vomit (similar to when first diagnosed with [facility]).  Last week she was in [place].  She ate more oysters than normal, otherwise denies any signifcant changes in her diet (alcohol, caffeine, spicy, citrus, etc). Denies any sick contacts.  She has a known hiatal hernia.  She is under the care of Dr. Collene Mares.  She has been on dexilant for a couple of years, switched from Prilosec after pH probe study per pt.  She is complaining of feeling very lethargic, no energy, since Sunday.  +feeling lightheaded if stands quickly.  Admits to maybe not having as much fluid as normal--eating/drinking less since GI symptoms began. Denies any black or bloody stools.  Gets diarrhea after drinking milk or eating Diona Browner donut (ever since cholecystectomy)  Review of chart and meds shows that she is on Vitamin D weekly prescription. This was called  in for her by GYN in September.  Review of labs shows that Vitamin D level was 64, and notes from provider state to take 1000 IU daily, that rx is NOT needed (yet rx was sent in for).  Past Medical History  Diagnosis Date  . GERD (gastroesophageal reflux disease)   . Herpes simplex 1976  . Right ureteral stone   . H/O hiatal hernia   . Exercise-induced asthma 30's  . Seasonal allergic rhinitis   . Environmental allergies   . Hyperlipidemia   . Anxiety     claustrophobic  . Basal cell carcinoma of forehead 2008; 2014  . Kidney stones   . Migraine teens, returned in 40"s    "depends on the change in the weather; last one was 1 wk ago" (09/02/2013)  . Arthritis     "neck, hands" (09/02/2013)   Past Surgical History  Procedure Laterality Date  . Posterior pinnacle mesh sacrospinous repair  11-22-2010    RECURRENT ENTEROCELE  . Vault suspension (dermal graft)/ enterocele and rectocele repair  12-05-2005    VAULT PROLAPSE/ ENTERCELE/ RECTOCELE  . Abdominal hysterectomy  1992  . Right ureteroscopic stone extraction w/ stent placement  08-12-2011    x2 stents  . Cystoscopy/retrograde/ureteroscopy/stone extraction with basket  10/21/2011    Procedure: CYSTOSCOPY/RETROGRADE/URETEROSCOPY/STONE EXTRACTION WITH BASKET;  Surgeon: Ailene Rud, MD;  Location: [facility];  Service: Urology;  Laterality: Right;  . Cystoscopy/retrograde/ureteroscopy  11/18/2011  Procedure: CYSTOSCOPY/RETROGRADE/URETEROSCOPY;  Surgeon: Ailene Rud, MD;  Location: [facility];  Service: Urology;  Laterality: Right;  . Cystoscopy w/ ureteral stent placement  03/05/2012    Procedure: CYSTOSCOPY WITH RETROGRADE PYELOGRAM/URETERAL STENT PLACEMENT;  Surgeon: Hanley Ben, MD;  Location: [place];  Service: Urology;  Laterality: Right;  . Cystoscopy/retrograde/ureteroscopy/stone extraction with basket Right 09/10/2012    Procedure: RIGHT URETEROSCOPY/STONE EXTRACTION/LASER  LITHOTRIPSY/POSSIBLE DOUBLE J STENT PLACEMENT;  Surgeon: Ailene Rud, MD;  Location: [facility];  Service: Urology;  Laterality: Right;  . Cesarean section  1981  . Tubal ligation  1991  . Mohs surgery  2008; 2014  . Cholecystectomy N/A 09/02/2013    Procedure: LAPAROSCOPIC CHOLECYSTECTOMY;  Surgeon: Harl Bowie, MD;  Location: [place];  Service: General;  Laterality: N/A;   History   Social History  . Marital Status: Married    Spouse Name: N/A    Number of Children: N/A  . Years of Education: N/A   Occupational History  . Not on file.   Social History Main Topics  . Smoking status: Never Smoker   . Smokeless tobacco: Never Used  . Alcohol Use: 0.6 - 1.2 oz/week    1-2 Cans of beer per week  . Drug Use: No  . Sexual Activity: Yes   Other Topics Concern  . Not on file   Social History Narrative    Outpatient Encounter Prescriptions as of 05/04/2014  Medication Sig Note  . ASMANEX 120 METERED DOSES 220 MCG/INH inhaler INHALE 1 PUFF AS DIRECTED TWICE A DAY   . aspirin EC 81 MG tablet Take 81 mg by mouth daily.   Marland Kitchen atorvastatin (LIPITOR) 20 MG tablet TAKE 1 TABLET ONCE DAILY.   Marland Kitchen dexlansoprazole (DEXILANT) 60 MG capsule Take 60 mg by mouth every morning.    . docusate sodium (COLACE) 100 MG capsule Take 200 mg by mouth 2 (two) times daily.     Marland Kitchen estradiol (VIVELLE-DOT) 0.05 MG/24HR patch APPLY ONE PATCH TWICE A WEEK, CHANGE SITES AND REAPPLY AS DIRECTED.   Marland Kitchen methocarbamol (ROBAXIN) 500 MG tablet Take 500 mg by mouth as needed for muscle spasms. 05/04/2014: Uses prn with migraines  . Multiple Vitamin (MULTI-VITAMIN PO) Take 1 tablet by mouth daily.   . naproxen sodium (ALEVE) 220 MG tablet Take 220 mg by mouth 2 (two) times daily.   . Vitamin D, Ergocalciferol, (DRISDOL) 50000 UNITS CAPS capsule Take 1 capsule (50,000 Units total) by mouth every Sunday.   . zonisamide (ZONEGRAN) 100 MG capsule Take 200 mg by mouth at bedtime.    Marland Kitchen albuterol (PROVENTIL HFA;VENTOLIN  HFA) 108 (90 BASE) MCG/ACT inhaler Inhale 2 puffs into the lungs every 6 (six) hours as needed for wheezing or shortness of breath. 05/04/2014: Not needing, uses prn  . meclizine (ANTIVERT) 25 MG tablet Take 1 tablet (25 mg total) by mouth 3 (three) times daily as needed for dizziness. 05/04/2014: Not needing  . rizatriptan (MAXALT) 10 MG tablet Take 10 mg by mouth as needed for migraine. May repeat in 2 hours if needed   . valACYclovir (VALTREX) 500 MG tablet TAKE 1 TABLET DAILY as directed 05/04/2014: Uses prn flares   Allergies  Allergen Reactions  . Iohexol Anaphylaxis     Code: HIVES, Desc: hives during ivp '96, ok w/ 13 hr prep @ [facility] Onset Date: [id number]   Desc: hives   . Ivp Dye [Iodinated Diagnostic Agents] Anaphylaxis  . Codeine Nausea And Vomiting  . Onion Other (See Comments)  Red Onion Only - Nausea, Vomiting and Passes Out   ROS: No fever, chills.  Had runny nose just 1 day, 3 days ago, none since. Denies cough, shortness of breath, wheezing.  Breathing is at baseline, not needing rescue inhaler. No rashes.  No joint pains, chest pain, palpitations or other concerns except as noted in HPI.  PHYSICAL EXAM: BP 110/72 mmHg  Pulse 80  Temp(Src) 97.9 F (36.6 C) (Tympanic)  Ht $R'5\' 1"'zo$  (1.549 m)  Wt 162 lb (73.483 kg)  BMI 30.63 kg/m2  LMP 06/23/1990 Well developed, pleasant female in no distress HEENT:  PERRL, EOMI, conjunctiva clear.  OP normal Neck: no lymphadenopathy, thyromegaly or mass Heart: regular rate and rhythm Lungs: clear bilaterally, no wheezes, rales, ronchi Abdomen: soft.  Tender across epigastrium and mild at LLQ.  Normal bowel sounds. No organomegaly or mass.  No rebound tenderness or guarding.  Lab Results  Component Value Date   TSH 1.862 02/23/2014   Vit D-OH 64 (02/23/14)  ASSESSMENT/PLAN:  Generalized abdominal pain - Plan: Comprehensive metabolic panel, CBC with Differential  Nausea without vomiting - Plan: Lipase, ondansetron (ZOFRAN  ODT) 4 MG disintegrating tablet  Medication monitoring encounter - Plan: Vit D  25 hydroxy (rtn osteoporosis monitoring)  Other fatigue - Plan: Comprehensive metabolic panel, CBC with Differential, Vit D  25 hydroxy (rtn osteoporosis monitoring)  Bloating  DDX reviewed in detail.  May be viral given fatigue and other symptoms. Worsening reflux, known hiatal hernia. F/u with Dr. Collene Mares if persists despite therapy and proper diet.  c-met, CBC, Vitamin D (to r/o toxicity, along with checking calcium level). Lipase  I recommend that you try and stay adequately hydrated.  Limit your caffeine, and citrus (avoid lemonade, orange juice)--drink water. For now, avoid lactose-containing foods (ie cheese, milk, dairy). Trial of simethicone (Gas-X). Continue Dexilant daily. Take Zofran as needed for nausea or vomiting. Stay upright for at least 2 hours after eating.  Eat small, frequent meals. If your abdominal discomfort, bloating and hiatal hernia/reflux symptoms are not improving, please follow up with Dr. Collene Mares for further evaluation.  Return if worsening pain, fevers, vomiting, new symptoms develop, blood in the stool, coffee grounds in the vomit.  Avoid anti-inflammatories.  Use tylenol as needed for pain  25 min visit, more than 1/2 spent counseling.

## 2014-05-04 NOTE — Patient Instructions (Signed)
  I recommend that you try and stay adequately hydrated.  Limit your caffeine, and citrus (avoid lemonade, orange juice)--drink water. For now, avoid lactose-containing foods (ie cheese, milk, dairy). Trial of simethicone (Gas-X). Continue Dexilant daily. Take Zofran as needed for nausea or vomiting. Stay upright for at least 2 hours after eating.  Eat small, frequent meals. If your abdominal discomfort, bloating and hiatal hernia/reflux symptoms are not improving, please follow up with Dr. Collene Mares for further evaluation.  Return if worsening pain, fevers, vomiting, new symptoms develop, blood in the stool, coffee grounds in the vomit.  Avoid anti-inflammatories.  Use tylenol as needed for pain

## 2014-05-05 LAB — VITAMIN D 25 HYDROXY (VIT D DEFICIENCY, FRACTURES): VIT D 25 HYDROXY: 56 ng/mL (ref 30–89)

## 2014-06-28 ENCOUNTER — Ambulatory Visit (HOSPITAL_COMMUNITY)
Admission: RE | Admit: 2014-06-28 | Discharge: 2014-06-28 | Disposition: A | Discharge status: Home / Self Care | Payer: BC Managed Care – PPO | Source: Ambulatory Visit | Attending: Medical | Admitting: Medical

## 2014-06-28 ENCOUNTER — Encounter: Payer: Self-pay | Admitting: Medical

## 2014-06-28 ENCOUNTER — Other Ambulatory Visit: Payer: Self-pay | Admitting: Medical

## 2014-06-28 ENCOUNTER — Other Ambulatory Visit: Payer: Self-pay | Admitting: Family Medicine

## 2014-06-28 ENCOUNTER — Other Ambulatory Visit (HOSPITAL_COMMUNITY): Payer: BC Managed Care – PPO

## 2014-06-28 ENCOUNTER — Telehealth: Payer: Self-pay | Admitting: Medical

## 2014-06-28 ENCOUNTER — Ambulatory Visit (INDEPENDENT_AMBULATORY_CARE_PROVIDER_SITE_OTHER): Payer: BC Managed Care – PPO | Admitting: Medical

## 2014-06-28 VITALS — BP 130/80 | HR 67 | Temp 98.1°F | Resp 14 | Wt 164.0 lb

## 2014-06-28 DIAGNOSIS — J45909 Unspecified asthma, uncomplicated: Secondary | ICD-10-CM | POA: Insufficient documentation

## 2014-06-28 DIAGNOSIS — J811 Chronic pulmonary edema: Secondary | ICD-10-CM

## 2014-06-28 DIAGNOSIS — I2782 Chronic pulmonary embolism: Secondary | ICD-10-CM

## 2014-06-28 DIAGNOSIS — I2699 Other pulmonary embolism without acute cor pulmonale: Secondary | ICD-10-CM

## 2014-06-28 DIAGNOSIS — I82431 Acute embolism and thrombosis of right popliteal vein: Secondary | ICD-10-CM | POA: Diagnosis not present

## 2014-06-28 DIAGNOSIS — M79604 Pain in right leg: Secondary | ICD-10-CM

## 2014-06-28 DIAGNOSIS — Z79890 Hormone replacement therapy (postmenopausal): Secondary | ICD-10-CM | POA: Diagnosis not present

## 2014-06-28 DIAGNOSIS — M79609 Pain in unspecified limb: Secondary | ICD-10-CM

## 2014-06-28 DIAGNOSIS — I82409 Acute embolism and thrombosis of unspecified deep veins of unspecified lower extremity: Secondary | ICD-10-CM

## 2014-06-28 HISTORY — DX: Acute embolism and thrombosis of unspecified deep veins of unspecified lower extremity: I82.409

## 2014-06-28 MED ORDER — TECHNETIUM TC 99M DIETHYLENETRIAME-PENTAACETIC ACID: 40.0000 | Freq: Once | INTRAVENOUS | Status: AC | PRN

## 2014-06-28 MED ORDER — RIVAROXABAN 15 MG PO TABS: ORAL_TABLET | ORAL | Status: DC

## 2014-06-28 MED ORDER — TECHNETIUM TO 99M ALBUMIN AGGREGATED
6.0000 | Freq: Once | INTRAVENOUS | Status: AC | PRN
  Administered 2014-06-28: 6 via INTRAVENOUS

## 2014-06-28 MED ORDER — HYDROCODONE-ACETAMINOPHEN 5-325 MG PO TABS
1.0000 | ORAL_TABLET | Freq: Four times a day (QID) | ORAL | Status: DC | PRN
Start: 2014-06-28 — End: 2014-08-15

## 2014-06-28 MED ORDER — RIVAROXABAN 20 MG PO TABS: 20.0000 mg | ORAL_TABLET | Freq: Every day | ORAL | Status: DC

## 2014-06-28 NOTE — Telephone Encounter (Signed)
pls call patient.   Vascular center called and she has a small DVT/clot in right popliteal vein.   My only other concern was that she has crackles in the lung bases too.  I would like to get a chest CT done as well to rule out clot in the lung/pulmonary embolism.  Make sure she is ok with this and see if we can get chest CT done today as well.   She is still at the hospital in the vascular lab.

## 2014-06-28 NOTE — Progress Notes (Signed)
Subjective: Here for leg pain.  Before Christmas had bad leg cramp in the middle of the night.  Since then felt a knot in the back of her right calve.   Has persisted with some discomfort until yesterday while at work had intermittent cramping and pain all day, aching pain too.   At one point yesterday, pain started in back of right knee, wrapped around to inside of knee, and over time the pain persisted up the thigh and down into calve.   Wasn't a cramp pain. Pain was so severe, felt like she would pass out.  Happened 4 different times similarly last night.   Last night took Baclofen, slept ok, didn't recur.  But this morning had the severe pain again, 3 times this morning.   In tears with the pain.  No prior similar.  She is somewhat worried about clot of cause of symptoms.   DVT risk factors - Has hx/o skin cancer.  No hx/o DVT/PE.   She does take vivelle/HRT.  No recent trauma, surgery, no long travel.  No other aggravating or relieving factors. No other complaint.   Review of Systems Constitutional: -fever, -chills, -sweats, -unexpected weight change,-fatigue ENT: -runny nose, -ear pain, -sore throat Cardiology:  -chest pain, -palpitations, -edema Respiratory: +cough, -shortness of breath, -wheezing Gastroenterology: -abdominal pain, -nausea, -vomiting, -diarrhea, -constipation  Hematology: -bleeding or bruising problems Urology: -dysuria, -difficulty urinating, -hematuria, -urinary frequency, -urgency Neurology: -headache, -weakness, -tingling, -numbness    Objective: BP 130/80 mmHg  Pulse 67  Temp(Src) 98.1 F (36.7 C) (Oral)  Resp 14  Wt 164 lb (74.39 kg)  LMP 06/23/1990  Gen: wd, wn, nad Skin: right leg with spider veins above and below knees, no obvious erythema, warmth, ecchymosis, induration.  MSK: Moderate tenderness along the right medial leg along medial thigh medial lower leg throughout, worse medial thigh just proximal to the knee and upwards along the medial thigh. No  palpable cord. There is some fullness in various parts of the both legs suggestive of underlying varicosities versus fatty deposits/ hyperlipemia, tender over medial right knee in general, no knee swelling no other deformity or pain, rest of leg nontender with normal range of motion no deformity Heart: RRR, no murmur, nomral s1, s2 Lungs: crackles in very low portions of bilat lung bases, no other abnormal lung sounds HENT unremarkable Ext: no edema, no cyanosis Pulses normal of LE Back: nontender Abdomen: nontneder, no mass, no organomegaly    Assessment: Encounter Diagnoses  Name Primary?  . Right leg pain Yes  . Hormone replacement therapy     Plan: Discussed possible etiologies.  Will send for ultrasound to rule out DVT today.  Can use hydrocodone prn for pain, heat to the area.  Avoid injury or trauma to the leg.  If DVT negative, consider phlebitis, strained muscle or other.

## 2014-06-28 NOTE — Progress Notes (Signed)
*  Preliminary Results* Right lower extremity venous duplex completed. Right lower extremity is positive for deep vein thrombosis involving a small segment of the distal popliteal vein. There is no evidence of right [name]'s cyst.  Preliminary results discussed with Dorothea Ogle, PA.   06/28/2014 3:22 PM  Maudry Mayhew, RVT, RDCS, RDMS

## 2014-06-28 NOTE — Telephone Encounter (Signed)
Patient is aware and she is going [facility] to get the chest CT done to night

## 2014-06-29 ENCOUNTER — Telehealth: Payer: Self-pay | Admitting: Family Medicine

## 2014-06-29 ENCOUNTER — Ambulatory Visit: Payer: BC Managed Care – PPO | Admitting: Family Medicine

## 2014-06-29 NOTE — Telephone Encounter (Signed)
I would just try some OTC support hose/compression hose.  The pharmacist should be able to point this out to her.   There are likely several brands, but anything to give a little compression would be fine.   If she has trouble finding this, we can use prescription compression hose, medium strength which have to be fitted by pharmacy.

## 2014-06-29 NOTE — Telephone Encounter (Signed)
Patient notified

## 2014-06-29 NOTE — Telephone Encounter (Signed)
Pt said she needs more info on what kind of compression socks you want her to get.  Please call her [phone]

## 2014-06-30 ENCOUNTER — Ambulatory Visit (INDEPENDENT_AMBULATORY_CARE_PROVIDER_SITE_OTHER): Payer: BC Managed Care – PPO | Admitting: Family Medicine

## 2014-06-30 ENCOUNTER — Encounter: Payer: Self-pay | Admitting: Family Medicine

## 2014-06-30 ENCOUNTER — Telehealth: Payer: Self-pay | Admitting: Nurse Practitioner

## 2014-06-30 VITALS — BP 120/72 | HR 67 | Wt 164.0 lb

## 2014-06-30 DIAGNOSIS — I82402 Acute embolism and thrombosis of unspecified deep veins of left lower extremity: Secondary | ICD-10-CM

## 2014-06-30 DIAGNOSIS — I82409 Acute embolism and thrombosis of unspecified deep veins of unspecified lower extremity: Secondary | ICD-10-CM | POA: Insufficient documentation

## 2014-06-30 DIAGNOSIS — I82401 Acute embolism and thrombosis of unspecified deep veins of right lower extremity: Secondary | ICD-10-CM

## 2014-06-30 NOTE — Telephone Encounter (Signed)
I have called the patient to discuss this new diagnosis of DVT.  She states before Christmas about 12/19 woke up in the middle of the night with a 'charlie horse' tightness and pain the leg.  Then had an increase of pain in the left leg with a tightness and sought care Tuesday 06/27/14.  A doppler US showed DVT.  MD immediately took her off ERT.  She is now on  off Vivelle patch.  Now on Xeralto.15 mg BID for 21 day. Then 20 mg for 3 months.  She needs information about potential symptoms since off ERT.  I have advised her that most of time we like to taper patient's off ERT but in her case we have to stop now.  She may experience increase in vaso symptoms along with some emotional changes.  These should be tolerable and much less intensity than when she first became menopausal.  She will monitor this and call back if things change.  Will  note in her chart this new history.

## 2014-06-30 NOTE — Telephone Encounter (Signed)
Patient calling requesting to speak with Edman Circle, NP. She said, "My primary care doctor took me off my hormones because of a blood clot and I want to talk with Patty about that." Patient is aware a nurse will call her first.

## 2014-06-30 NOTE — Patient Instructions (Signed)
Take ibuprofen 800 mg 3 times a day as needed for pain and use the codeine if you need to. At work keep her foot elevated and get up and move every couple hours

## 2014-06-30 NOTE — Progress Notes (Signed)
   Subjective:    Patient ID: Carol [name], female    DOB: 1950-08-06, 64 y.o.   MRN: [id number]  HPI She is here for a follow-up. She does feel very sore and posterior aspect of her knee , stinging and tingling sensation in the posterior knee proximal into the groin area as well as being quite fatigued but no chest pain, shortness of breath.   Review of Systems     Objective:   Physical Exam Homans sign is negative. Tender to palpation in the popliteal area. Also tender in the medial thigh area.       Assessment & Plan:  DVT (deep venous thrombosis), right  continue with conservative care. Keep her leg elevated when she is not walking. I will allow her to return to work but recommend she get up and move around every few hours and keep her leg elevated as much as possible. Discussed follow-up in 3 months including possible scan versus a hypercoagulable workup since he really has no major precipitating factor.

## 2014-06-30 NOTE — Telephone Encounter (Signed)
Spoke with patient. Patient states that she was seen with PCP this week and they found a blood clot in her leg. "They immediately took me off my vivelle dot since that is a side effect of that medication. I just wanted to let Patty know and to get her advice. What can I expect with coming off of the medicine cold [place]? Does she have any recommendations for me?" Advised patient will need to speak with Milford Cage, FNP and return call with further recommendations. Patient is agreeable.

## 2014-07-10 ENCOUNTER — Telehealth: Payer: Self-pay | Admitting: Internal Medicine

## 2014-07-10 ENCOUNTER — Other Ambulatory Visit: Payer: Self-pay

## 2014-07-10 MED ORDER — ATORVASTATIN CALCIUM 20 MG PO TABS: 20.0000 mg | ORAL_TABLET | Freq: Every day | ORAL | Status: DC

## 2014-07-10 NOTE — Telephone Encounter (Signed)
Refill request for atorvastatin 20mg  to [facility]

## 2014-07-10 NOTE — Telephone Encounter (Signed)
done

## 2014-07-18 ENCOUNTER — Telehealth: Payer: Self-pay | Admitting: Nurse Practitioner

## 2014-07-18 NOTE — Telephone Encounter (Signed)
Spoke with patient. Patient had to come off estrogen patch due to blood clot found in right lower leg two weeks ago. Patient was started on Xarelto. Patient states that since coming off estrogen when she comes home from work at night to rest she props her legs up. "When I prop my legs up and start to relax it feels like they are crawling below my knees. I have to get up and walk to get rid of it. I can't sleep at night because it happens so much. On the weekend when I am relaxing it does not happen." Patient has not contacted PCP regarding this yet. Patient would like me to speak with Milford Cage, FNP first. "I would like to know what she thinks." Advised will speak with Milford Cage, FNP and return phone call. Patient is agreeable.

## 2014-07-18 NOTE — Telephone Encounter (Signed)
Pt says she is having leg cramps since she was taken off the estrogen patch.

## 2014-07-19 ENCOUNTER — Telehealth: Payer: Self-pay | Admitting: Internal Medicine

## 2014-07-19 NOTE — Telephone Encounter (Signed)
Pt called stating that she had a blood clot 21 days ago and yesterday she was walking up a hill and right below where the blood clot was, she got a pain and it is a constant pain and feeling like a grabbing sensation in her right leg. Please advise what you want her to do

## 2014-07-19 NOTE — Telephone Encounter (Signed)
done

## 2014-07-19 NOTE — Telephone Encounter (Signed)
Have her set up an appointment 

## 2014-07-20 ENCOUNTER — Ambulatory Visit (INDEPENDENT_AMBULATORY_CARE_PROVIDER_SITE_OTHER): Payer: BC Managed Care – PPO | Admitting: Family Medicine

## 2014-07-20 ENCOUNTER — Encounter: Payer: Self-pay | Admitting: Family Medicine

## 2014-07-20 VITALS — BP 120/76 | HR 71 | Wt 164.0 lb

## 2014-07-20 DIAGNOSIS — M79661 Pain in right lower leg: Secondary | ICD-10-CM

## 2014-07-20 DIAGNOSIS — I82401 Acute embolism and thrombosis of unspecified deep veins of right lower extremity: Secondary | ICD-10-CM

## 2014-07-20 NOTE — Patient Instructions (Signed)
Heat and stretching for your calves and your feet and Tylenol

## 2014-07-20 NOTE — Progress Notes (Signed)
   Subjective:    Patient ID: Carol [name], female    DOB: 03/07/1951, 64 y.o.   MRN: [id number]  HPI Yesterday we'll walking into work she felt spasm in the low right upper calf area. She does note difficulty with aching in her legs since the DVT was diagnosed. She presently is on XARELTO. Today she starts to 20 mg per day dosing She states that since the diagnosis she has had difficulty with bilateral foot pain does tend to radiate into the medial aspect of the calves. She has been using an OTC med for that no relief. Her work does require some walking but most the time she sitting. No chest pain or shortness of breath   Review of Systems     Objective:   Physical Exam Calf exam on the right does show slight tenderness but it is not warm. Pulses are normal. Skin is normal. Left foot exam is normal. Pulses normal. No calf tenderness.         Assessment & Plan:  DVT (deep venous thrombosis), right  Pain of right lower leg  The exact etiology of her pain is really unclear but I do not see any major issue here. I explained this to her. Recommend that she try Tylenol for pain relief, heat and call if further difficulty.

## 2014-07-21 NOTE — Telephone Encounter (Signed)
Patient was seen 07/20/2014 with PCP in regards to message below. Notes available in EPIC. Milford Cage, FNP okay to close encounter?

## 2014-07-21 NOTE — Telephone Encounter (Signed)
OK to close encounter.  Seen at PCP.

## 2014-08-15 ENCOUNTER — Encounter: Payer: Self-pay | Admitting: Medical

## 2014-08-15 ENCOUNTER — Ambulatory Visit
Admission: RE | Admit: 2014-08-15 | Discharge: 2014-08-15 | Disposition: A | Discharge status: Home / Self Care | Payer: BC Managed Care – PPO | Source: Ambulatory Visit | Attending: Medical | Admitting: Medical

## 2014-08-15 ENCOUNTER — Ambulatory Visit (INDEPENDENT_AMBULATORY_CARE_PROVIDER_SITE_OTHER): Payer: BC Managed Care – PPO | Admitting: Medical

## 2014-08-15 VITALS — BP 128/82 | HR 75 | Resp 14 | Wt 164.0 lb

## 2014-08-15 DIAGNOSIS — W1809XA Striking against other object with subsequent fall, initial encounter: Secondary | ICD-10-CM

## 2014-08-15 DIAGNOSIS — M25531 Pain in right wrist: Secondary | ICD-10-CM

## 2014-08-15 DIAGNOSIS — M79641 Pain in right hand: Secondary | ICD-10-CM | POA: Diagnosis not present

## 2014-08-15 DIAGNOSIS — I82409 Acute embolism and thrombosis of unspecified deep veins of unspecified lower extremity: Secondary | ICD-10-CM | POA: Diagnosis not present

## 2014-08-15 DIAGNOSIS — Z7901 Long term (current) use of anticoagulants: Secondary | ICD-10-CM | POA: Diagnosis not present

## 2014-08-15 MED ORDER — HYDROCODONE-ACETAMINOPHEN 5-325 MG PO TABS: 1.0000 | ORAL_TABLET | Freq: Four times a day (QID) | ORAL | Status: DC | PRN

## 2014-08-15 NOTE — Progress Notes (Signed)
Subjective: Here for fall and injury occurred last night 2/22/216. Last night she was turning to wave at her neighbor, and as she turned, tripped on uneven surface in driveway.  Foot went sideways and she fell on right outstretched hand.  Denies pop, but it hurt immediately.  Currently has pain in right thumb, hand, wrist, decreased ROM, burning sensation.  Denies head injury.  No other aggravating or relieving factors. No other complaint.  She is on xarelto for DVT leg.   Past Medical History  Diagnosis Date  . GERD (gastroesophageal reflux disease)   . Herpes simplex 1976  . Right ureteral stone   . H/O hiatal hernia   . Exercise-induced asthma 30's  . Seasonal allergic rhinitis   . Environmental allergies   . Hyperlipidemia   . Anxiety     claustrophobic  . Basal cell carcinoma of forehead 2008; 2014  . Kidney stones   . Migraine teens, returned in 40"s    "depends on the change in the weather; last one was 1 wk ago" (09/02/2013)  . Arthritis     "neck, hands" (09/02/2013)   ROS as in subjective  Objective: BP 128/82 mmHg  Pulse 75  Resp 14  Wt 164 lb (74.39 kg)  LMP 06/23/1990  Gen: wd, wn, nad Skin: no obvious erythema or ecchymosis MSK: tender over right base of thumb, right wrist throughout particularly medial side, right hand over first second and third metacarpals, mildly decreased range of motion of wrist, certainly decreased range of motion of right thumb, however resisted flexion and extension of third fourth and fifth fingers seem okay. No obvious deformity of forearm.  The rest of bilateral hand exam of arm exam unremarkable Pulses normal, normal cap refill of hands Ext: no edema Neuro: decreased strength of right thumb 1st and 2nd fingers, otherwise seemingly normal sensation strength otherwise   Assessment: Encounter Diagnoses  Name Primary?  . Right hand pain Yes  . Right wrist pain   . Fall against object, initial encounter   . DVT (deep venous  thrombosis), unspecified laterality   . Long term current use of anticoagulant therapy    Plan: Sent for x-ray right hand and wrist.  Discussed possible injuries. For now rest, avoid re injury, hydrocodone prescription for pain when necessary.  Advise that we would likely use splint ice elevation and rest if no fracture. If there is a fracture would likely need orthopedic referral.  I answered her questions about Xarelto in DVT treatment and she is currently on appropriate therapy has follow-up with Dr. Redmond School in April

## 2014-08-23 NOTE — Telephone Encounter (Signed)
Not needed

## 2014-09-08 ENCOUNTER — Ambulatory Visit (HOSPITAL_COMMUNITY)
Admission: AD | Admit: 2014-09-08 | Discharge: 2014-09-08 | Disposition: A | Discharge status: Home / Self Care | Payer: BC Managed Care – PPO | Source: Ambulatory Visit | Attending: Urology | Admitting: Urology

## 2014-09-08 ENCOUNTER — Encounter (HOSPITAL_COMMUNITY)
Admission: AD | Disposition: A | Discharge status: Home / Self Care | Payer: Self-pay | Source: Ambulatory Visit | Attending: Urology

## 2014-09-08 ENCOUNTER — Ambulatory Visit (HOSPITAL_COMMUNITY): Payer: BC Managed Care – PPO | Admitting: Anesthesiology

## 2014-09-08 ENCOUNTER — Other Ambulatory Visit: Payer: Self-pay | Admitting: Urology

## 2014-09-08 ENCOUNTER — Encounter (HOSPITAL_COMMUNITY): Payer: Self-pay | Admitting: *Deleted

## 2014-09-08 DIAGNOSIS — N200 Calculus of kidney: Secondary | ICD-10-CM | POA: Insufficient documentation

## 2014-09-08 DIAGNOSIS — N201 Calculus of ureter: Secondary | ICD-10-CM | POA: Insufficient documentation

## 2014-09-08 DIAGNOSIS — Z9049 Acquired absence of other specified parts of digestive tract: Secondary | ICD-10-CM | POA: Insufficient documentation

## 2014-09-08 DIAGNOSIS — Z886 Allergy status to analgesic agent status: Secondary | ICD-10-CM | POA: Diagnosis not present

## 2014-09-08 DIAGNOSIS — Z7982 Long term (current) use of aspirin: Secondary | ICD-10-CM | POA: Diagnosis not present

## 2014-09-08 DIAGNOSIS — J45909 Unspecified asthma, uncomplicated: Secondary | ICD-10-CM | POA: Insufficient documentation

## 2014-09-08 DIAGNOSIS — E785 Hyperlipidemia, unspecified: Secondary | ICD-10-CM | POA: Insufficient documentation

## 2014-09-08 DIAGNOSIS — Z86718 Personal history of other venous thrombosis and embolism: Secondary | ICD-10-CM | POA: Insufficient documentation

## 2014-09-08 DIAGNOSIS — Z888 Allergy status to other drugs, medicaments and biological substances status: Secondary | ICD-10-CM | POA: Diagnosis not present

## 2014-09-08 DIAGNOSIS — F419 Anxiety disorder, unspecified: Secondary | ICD-10-CM | POA: Insufficient documentation

## 2014-09-08 DIAGNOSIS — K219 Gastro-esophageal reflux disease without esophagitis: Secondary | ICD-10-CM | POA: Insufficient documentation

## 2014-09-08 DIAGNOSIS — Z79899 Other long term (current) drug therapy: Secondary | ICD-10-CM | POA: Diagnosis not present

## 2014-09-08 DIAGNOSIS — M1990 Unspecified osteoarthritis, unspecified site: Secondary | ICD-10-CM | POA: Diagnosis not present

## 2014-09-08 DIAGNOSIS — G43909 Migraine, unspecified, not intractable, without status migrainosus: Secondary | ICD-10-CM | POA: Insufficient documentation

## 2014-09-08 HISTORY — PX: CYSTOSCOPY WITH RETROGRADE PYELOGRAM, URETEROSCOPY AND STENT PLACEMENT: SHX5789

## 2014-09-08 HISTORY — DX: Acute embolism and thrombosis of unspecified deep veins of unspecified lower extremity: I82.409

## 2014-09-08 LAB — BASIC METABOLIC PANEL
ANION GAP: 11 (ref 5–15)
BUN: 20 mg/dL (ref 6–23)
CALCIUM: 9.3 mg/dL (ref 8.4–10.5)
CHLORIDE: 109 mmol/L (ref 96–112)
CO2: 21 mmol/L (ref 19–32)
CREATININE: 0.86 mg/dL (ref 0.50–1.10)
GFR calc Af Amer: 83 mL/min — ABNORMAL LOW (ref >90)
GFR, EST NON AFRICAN AMERICAN: 71 mL/min — AB (ref >90)
Glucose, Bld: 102 mg/dL — ABNORMAL HIGH (ref 70–99)
Potassium: 3.7 mmol/L (ref 3.5–5.1)
Sodium: 141 mmol/L (ref 135–145)

## 2014-09-08 SURGERY — CYSTOURETEROSCOPY, WITH RETROGRADE PYELOGRAM AND STENT INSERTION
Anesthesia: General | Site: Ureter | Laterality: Right

## 2014-09-08 MED ORDER — FENTANYL CITRATE 0.05 MG/ML IJ SOLN
INTRAMUSCULAR | Status: AC
  Filled 2014-09-08: qty 2

## 2014-09-08 MED ORDER — SODIUM CHLORIDE 0.9 % IR SOLN
Status: DC | PRN
  Administered 2014-09-08: 6000 mL

## 2014-09-08 MED ORDER — ONDANSETRON HCL 4 MG/2ML IJ SOLN: 4.0000 mg | Freq: Once | INTRAMUSCULAR | Status: DC | PRN

## 2014-09-08 MED ORDER — PROPOFOL 10 MG/ML IV BOLUS
INTRAVENOUS | Status: DC | PRN
  Administered 2014-09-08: 130 mg via INTRAVENOUS

## 2014-09-08 MED ORDER — DIPHENHYDRAMINE HCL 50 MG/ML IJ SOLN
INTRAMUSCULAR | Status: AC
  Filled 2014-09-08: qty 1

## 2014-09-08 MED ORDER — FENTANYL CITRATE 0.05 MG/ML IJ SOLN
INTRAMUSCULAR | Status: DC | PRN
  Administered 2014-09-08: 25 ug via INTRAVENOUS
  Administered 2014-09-08: 50 ug via INTRAVENOUS

## 2014-09-08 MED ORDER — FENTANYL CITRATE 0.05 MG/ML IJ SOLN: 25.0000 ug | INTRAMUSCULAR | Status: DC | PRN

## 2014-09-08 MED ORDER — MIDAZOLAM HCL 5 MG/5ML IJ SOLN
INTRAMUSCULAR | Status: DC | PRN
  Administered 2014-09-08: 2 mg via INTRAVENOUS

## 2014-09-08 MED ORDER — CIPROFLOXACIN IN D5W 400 MG/200ML IV SOLN
400.0000 mg | INTRAVENOUS | Status: AC
  Administered 2014-09-08: 400 mg via INTRAVENOUS

## 2014-09-08 MED ORDER — 0.9 % SODIUM CHLORIDE (POUR BTL) OPTIME
TOPICAL | Status: DC | PRN
  Administered 2014-09-08: 1000 mL

## 2014-09-08 MED ORDER — MIDAZOLAM HCL 2 MG/2ML IJ SOLN
INTRAMUSCULAR | Status: AC
  Filled 2014-09-08: qty 2

## 2014-09-08 MED ORDER — DIPHENHYDRAMINE HCL 50 MG/ML IJ SOLN
INTRAMUSCULAR | Status: DC | PRN
  Administered 2014-09-08: 25 mg via INTRAVENOUS

## 2014-09-08 MED ORDER — IOHEXOL 300 MG/ML  SOLN
INTRAMUSCULAR | Status: DC | PRN
  Administered 2014-09-08: 10 mL via URETHRAL

## 2014-09-08 MED ORDER — LACTATED RINGERS IV SOLN
INTRAVENOUS | Status: DC | PRN
  Administered 2014-09-08: 19:00:00 via INTRAVENOUS

## 2014-09-08 MED ORDER — MEPERIDINE HCL 50 MG/ML IJ SOLN: 6.2500 mg | INTRAMUSCULAR | Status: DC | PRN

## 2014-09-08 MED ORDER — METOCLOPRAMIDE HCL 5 MG/ML IJ SOLN
INTRAMUSCULAR | Status: AC
  Filled 2014-09-08: qty 2

## 2014-09-08 MED ORDER — ONDANSETRON HCL 4 MG/2ML IJ SOLN
INTRAMUSCULAR | Status: DC | PRN
Start: 2014-09-08 — End: 2014-09-08
  Administered 2014-09-08: 4 mg via INTRAVENOUS

## 2014-09-08 MED ORDER — PROPOFOL 10 MG/ML IV BOLUS
INTRAVENOUS | Status: AC
  Filled 2014-09-08: qty 20

## 2014-09-08 MED ORDER — ONDANSETRON HCL 4 MG/2ML IJ SOLN
INTRAMUSCULAR | Status: AC
  Filled 2014-09-08: qty 2

## 2014-09-08 MED ORDER — CIPROFLOXACIN IN D5W 400 MG/200ML IV SOLN
INTRAVENOUS | Status: AC
  Filled 2014-09-08: qty 200

## 2014-09-08 MED ORDER — METOCLOPRAMIDE HCL 5 MG/ML IJ SOLN
INTRAMUSCULAR | Status: DC | PRN
  Administered 2014-09-08: 10 mg via INTRAVENOUS

## 2014-09-08 MED ORDER — HYDROCODONE-ACETAMINOPHEN 5-325 MG PO TABS: 1.0000 | ORAL_TABLET | Freq: Four times a day (QID) | ORAL | Status: DC | PRN

## 2014-09-08 MED ORDER — LACTATED RINGERS IV SOLN: INTRAVENOUS | Status: DC

## 2014-09-08 SURGICAL SUPPLY — 25 items
BAG URO CATCHER STRL LF (DRAPE) ×4 IMPLANT
BASKET ZERO TIP NITINOL 2.4FR (BASKET) ×3 IMPLANT
BSKT STON RTRVL ZERO TP 2.4FR (BASKET) ×2
CATH INTERMIT  6FR 70CM (CATHETERS) ×4 IMPLANT
CLOTH BEACON ORANGE TIMEOUT ST (SAFETY) ×4 IMPLANT
FIBER LASER FLEXIVA 1000 (UROLOGICAL SUPPLIES) IMPLANT
FIBER LASER FLEXIVA 200 (UROLOGICAL SUPPLIES) IMPLANT
FIBER LASER FLEXIVA 365 (UROLOGICAL SUPPLIES) IMPLANT
FIBER LASER FLEXIVA 550 (UROLOGICAL SUPPLIES) IMPLANT
FIBER LASER TRAC TIP (UROLOGICAL SUPPLIES) IMPLANT
GLOVE BIO SURGEON STRL SZ7.5 (GLOVE) ×4 IMPLANT
GLOVE BIOGEL M STRL SZ7.5 (GLOVE) ×4 IMPLANT
GLOVE BIOGEL PI IND STRL 7.5 (GLOVE) ×1 IMPLANT
GLOVE BIOGEL PI INDICATOR 7.5 (GLOVE) ×2
GOWN STRL REUS W/TWL LRG LVL3 (GOWN DISPOSABLE) ×8 IMPLANT
GUIDEWIRE ANG ZIPWIRE 038X150 (WIRE) IMPLANT
GUIDEWIRE STR DUAL SENSOR (WIRE) ×4 IMPLANT
IV NS IRRIG 3000ML ARTHROMATIC (IV SOLUTION) ×8 IMPLANT
MANIFOLD NEPTUNE II (INSTRUMENTS) ×4 IMPLANT
NS IRRIG 1000ML POUR BTL (IV SOLUTION) ×4 IMPLANT
PACK CYSTO (CUSTOM PROCEDURE TRAY) ×4 IMPLANT
SHIELD EYE BINOCULAR (MISCELLANEOUS) IMPLANT
STENT CONTOUR 6FRX24X.038 (STENTS) ×3 IMPLANT
TUBING CONNECTING 10 (TUBING) ×3 IMPLANT
TUBING CONNECTING 10' (TUBING) ×1

## 2014-09-08 NOTE — Progress Notes (Signed)
PACU Nursing Note: Discharge Note: Pt alert and oriented x 4, pt able to stand and ambulate to bathroom w/ min assistance, gait very steady, no c/o nausea after taking POs, tolerated well, pt able to void w/o difficulty or complaints of pain. Pt states very comfortable and denies any pain or discomfort. Pt teaching done w/ pt and significant other at bedside, pt teaching done re: (1) importance of hand washing, (2) post op diet and taking in clear fluids as instructed by primary MD (3) to schedule and keep post op MD appoint (4) safety of taking PO narcotics as prescribed my MD. (5) times to notify MD, i.e. S&S of infection, unable to void, pain not controlled etc. Pt instructions done via teach back method and explaination, prescription and instructions given to significant other. Opportunity for questions provided again after teaching to both pt and significant other. Pt escorted via wc with significant other to private vehicle, prior to leaving, opportunity for questions again offered.  Stormy Card, BSN, RN, RRT, CPAN, CCRN

## 2014-09-08 NOTE — H&P (Signed)
Reason For Visit Seen today as a work-in for right flank pain with vomiting.   Active Problems Problems  1. Calculus of ureter (N20.1)   Assessed By: Jimmey Ralph (Urology); Last Assessed: 08 Sep 2014 2. Nephrolithiasis (N20.0)   Assessed By: Jimmey Ralph (Urology); Last Assessed: 08 Sep 2014 3. Right flank pain (R10.9)   Assessed By: Jimmey Ralph (Urology); Last Assessed: 08 Sep 2014 4. Vomiting (R11.10)   Assessed By: Jimmey Ralph (Urology); Last Assessed: 08 Sep 2014  History of Present Illness     64 YO female patient of Dr. Arlyn [name] seen today as a work-in for right flank pain with vomiting.    GU hx:  Hx of kidney stones. She is is currently taking Potassium Citrate 20-60 MEQ/day and increasing her fluid intake per 24 hour urine results.        Carol [name] underwent right ureteroscopy, stone extraction, and placement of double J stent on 09/10/12. Her stent was removed 09/13/12. She had ongoing dilation of the right kidney status post stent removal. A lasix renogram indicated lack of obstruction.    Interval history June 2015:   Seen for a 2 month follow up. She is doing well today. I saw her 2 months ago with dysuria. Her urine culture was negative. Today she denies any bothersome LUTS. She denies flank pain or gross hematuria.     March 2016 Interval Hx:   States that pain began about 12 hrs ago and has gotten worse over time. Is now radiating into RLQ. Has vomiting X 1. Denies gross hematuria.   Past Medical History Problems  1. History of Anxiety (F41.9) 2. History of Asthma (J45.909) 3. History of esophageal reflux (Z87.19) 4. History of hypercholesterolemia (Z86.39) 5. History of Murmur (R01.1)  Surgical History Problems  1. History of Bladder Surgery 2. History of Cesarean Section 3. History of Cystoscopy With Insertion Of Ureteral Stent Right 4. History of Cystoscopy With Insertion Of Ureteral Stent Right 5. History of Cystoscopy With  Ureteroscopy With Lithotripsy 6. History of Cystoscopy With Ureteroscopy With Lithotripsy 7. History of Cystoscopy With Ureteroscopy With Removal Of Calculus 8. History of Cystoscopy With Ureteroscopy With Removal Of Calculus 9. History of Hysterectomy 10. History of Lithotripsy 11. History of Posterior Colporrhaphy (For Pelvic Relaxation) 12. History of Sacrospinous Ligament Fixation For Posthysterectomy Prolapse 13. History of Tubal Ligation 14. History of Vaginal Surg Insertion Of Mesh For Pelvic Floor Repair  Current Meds 1. Asmanex 120 Metered Doses 220 MCG/INH Inhalation Aerosol Powder Breath Activated;  Therapy: (Recorded:18Mar2016) to Recorded 2. Chlordiazepoxide-Clidinium 5-2.5 MG Oral Capsule;  Therapy: [id number] to Recorded 3. Dexilant 60 MG Oral Capsule Delayed Release;  Therapy: 28Jan2011 to Recorded 4. Lipitor 20 MG Oral Tablet;  Therapy: (Recorded:02May2012) to Recorded 5. Maxalt 10 MG Oral Tablet;  Therapy: 23Apr2012 to Recorded 6. Methocarbamol 500 MG Oral Tablet;  Therapy: (Recorded:18Mar2016) to Recorded 7. Methocarbamol 500 MG Oral Tablet;  Therapy: 23Apr2012 to Recorded 8. Stool Softener CAPS;  Therapy: (Recorded:02May2012) to Recorded 9. Vitamin D (Ergocalciferol) 50000 UNIT Oral Capsule;  Therapy: 10Apr2012 to Recorded 10. Xarelto 20 MG Oral Tablet;   Therapy: (Recorded:18Mar2016) to Recorded 11. Zonisamide 100 MG Oral Capsule;   Therapy: 13Sep2011 to Recorded  Allergies Medication  1. Codeine Derivatives 2. Nitrofurantoin Monohyd Macro CAPS Non-Medication  3. Contrast Dye  Family History Problems  1. Family history of Bladder Cancer : Father 2. Family history of Blood In Urine : Mother 3. Family history of Death In The Family Father : Father  father passed @ age 9staph infection 108. Family history of Death In The Family Mother : Mother   mother passed @ age 51brain bleed 99. Family history of Family Health Status Number Of Children   1  son2 daughters 69. Family history of Nephrolithiasis : Mother  Social History Problems  1. Alcohol Use   2 drinks per week 2. Caffeine Use   no caffeine 3. Marital History - Single   female partner 4. Never A Smoker 5. Occupation:   Materials engineer  Review of Systems Genitourinary, constitutional, skin, eye, otolaryngeal, hematologic/lymphatic, cardiovascular, pulmonary, endocrine, musculoskeletal, gastrointestinal, neurological and psychiatric system(s) were reviewed and pertinent findings if present are noted and are otherwise negative.  Gastrointestinal: vomiting, flank pain and abdominal pain.    Vitals Vital Signs [Data Includes: Last 1 Day]  Recorded: [id number] 03:23PM  Blood Pressure: 124 / 83 Temperature: 97.4 F Heart Rate: 75  Physical Exam Constitutional: Well nourished and well developed . No acute distress. The patient appears well hydrated.  ENT:. The ears and nose are normal in appearance.  Neck: The appearance of the neck is normal.  Pulmonary: No respiratory distress.  Cardiovascular: Heart rate and rhythm are normal.  Abdomen: The abdomen is mildly obese. Mild tenderness in the RLQ is present, but no LLQ tenderness. mild right CVA tenderness and no left CVA tenderness.  Skin: Normal skin turgor.  Neuro/Psych:. Mood and affect are appropriate.    Results/Data Urine [Data Includes: Last 1 Day]   [id number]  COLOR YELLOW   APPEARANCE CLEAR   SPECIFIC GRAVITY 1.030   pH 6.0   GLUCOSE NEG mg/dL  BILIRUBIN NEG   KETONE NEG mg/dL  BLOOD NEG   PROTEIN NEG mg/dL  UROBILINOGEN 0.2 mg/dL  NITRITE NEG   LEUKOCYTE ESTERASE NEG    The following images/tracing/specimen were independently visualized:  CT urogram: Shows chronically dilated right renal pelvis and ureter. Today there does appear to be 3-4 small linear calcifications noted in distal 1/3 of ureter. Small nonobstructing left lower pole calculus. No calcification noted along this ureter. Dr. Alinda Money  has reviewed CT and discussed with pt.  The following clinical lab reports were reviewed:  UA: negative.    Assessment Assessed  1. Right flank pain (R10.9) 2. Nephrolithiasis (N20.0) 3. Vomiting (R11.10) 4. Calculus of ureter (N20.1)  Plan Health Maintenance  1. UA With REFLEX; [Do Not Release]; Status:Complete;   Done: [id number] 03:06PM Nephrolithiasis, Right flank pain, Vomiting  2. AU CT-STONE PROTOCOL; Status:In Progress - Specimen/Data Collected;   Done:  [id number] 12:00AM Right flank pain  3. Administered: Ketorolac Tromethamine 30 MG/ML Injection Solution  Ketorolac 30 mg IM today   I discussed situation with Dr. Alinda Money he came down and evaluated pt and at this time will proceed with cystoscopy, right retrograde pyelogram, possible ureteroscopy, stone extraction, and double J stent placement. Risks and benefits of procedure discussed which include: general anesthesia, hematuria, infection, and possible injury to bladder or ureter. All questions answered and she wishes to proceed.   Signatures Electronically signed by : Jimmey Ralph, Trey Paula; Sep 08 2014  4:57PM EST

## 2014-09-08 NOTE — Op Note (Signed)
Preoperative diagnosis: Right ureteral calculus  Postoperative diagnosis: Right ureteral calculus, Possible right ureteral stricture  Procedure:  1. Cystoscopy 2. Right ureteroscopy and stone removal 3. Right ureteral stent placement (6 x 24) no string 4. Right retrograde pyelography with interpretation  Surgeon: Pryor Curia. M.D.  Anesthesia: General  Complications: None  Intraoperative findings: Right retrograde pyelography demonstrated a dilated right ureter with distal narrowing.  No obvious filling defect was identified. Care was taken not to inject into the kidney considering history of IV contrast allergy.  EBL: Minimal  Indication: MISHEEL Carol [name] is a 64 y.o. year old patient with urolithiasis.  She also has a history of chronic right hydroureteronephrosis of unclear etiology.  She presented with acute right flank and abdominal pain and her CT demonstrated a possible ureteral stone although it was not clear that this was the clear cause of her symptoms.  After reviewing the management options for treatment, the patient elected to proceed with the above surgical procedure(s). We have discussed the potential benefits and risks of the procedure, side effects of the proposed treatment, the likelihood of the patient achieving the goals of the procedure, and any potential problems that might occur during the procedure or recuperation. Informed consent has been obtained.  Description of procedure:  The patient was taken to the operating room and general anesthesia was induced.  The patient was placed in the dorsal lithotomy position, prepped and draped in the usual sterile fashion, and preoperative antibiotics were administered. A preoperative time-out was performed.   Cystourethroscopy was performed.  The patient's urethra was examined and was normal. The bladder was then systematically examined in its entirety. There was no evidence for any bladder tumors, stones, or other  mucosal pathology.    Attention then turned to the right ureteral orifice and a ureteral catheter was used to intubate the ureteral orifice.  Omnipaque contrast was injected through the ureteral catheter and a retrograde pyelogram was performed with findings as dictated above.  A 0.38 sensor guidewire was then advanced up the right ureter into the renal pelvis under fluoroscopic guidance. The 6 Fr semirigid ureteroscope was then advanced into the ureter next to the guidewire.  A small calculus was identified that appeared very small (about 1-2 mm).  This stone was then removed from the ureter with a zero tip nitinol basket. It was so small and crumbled upon removal leaving no specimen for analysis.  Reinspection of the ureter revealed no remaining visible stones or fragments. The ureteroscope was able to be advanced all the way up to the kidney.  The wire was then backloaded through the cystoscope and a ureteral stent was advance over the wire using Seldinger technique.  The stent was positioned appropriately under fluoroscopic and cystoscopic guidance.  The wire was then removed with an adequate stent curl noted in the renal pelvis as well as in the bladder.  The bladder was then emptied and the procedure ended.  The patient appeared to tolerate the procedure well and without complications.  The patient was able to be awakened and transferred to the recovery unit in satisfactory condition.

## 2014-09-08 NOTE — Anesthesia Procedure Notes (Signed)
Procedure Name: LMA Insertion Date/Time: 09/08/2014 7:09 PM Performed by: Johnathan Hausen A Pre-anesthesia Checklist: Patient identified, Emergency Drugs available, Suction available, Patient being monitored and Timeout performed Patient Re-evaluated:Patient Re-evaluated prior to inductionOxygen Delivery Method: Circle system utilized Preoxygenation: Pre-oxygenation with 100% oxygen Intubation Type: Combination inhalational/ intravenous induction LMA: LMA with gastric port inserted LMA Size: 4.0 Tube type: Oral Number of attempts: 1 Placement Confirmation: positive ETCO2 and breath sounds checked- equal and bilateral Tube secured with: Tape Dental Injury: Teeth and Oropharynx as per pre-operative assessment

## 2014-09-08 NOTE — Anesthesia Postprocedure Evaluation (Signed)
  Anesthesia Post-op Note  Patient: Carol [name]  Procedure(s) Performed: Procedure(s): CYSTOSCOPY WITH RIGHT  RETROGRADE PYELOGRAM, URETEROSCOPY AND RIGHT STENT PLACEMENT (Right) Patient is awake and responsive. Pain and nausea are reasonably well controlled. Vital signs are stable and clinically acceptable. Oxygen saturation is clinically acceptable. There are no apparent anesthetic complications at this time. Patient is ready for discharge.

## 2014-09-08 NOTE — Transfer of Care (Signed)
Immediate Anesthesia Transfer of Care Note  Patient: Carol [name]  Procedure(s) Performed: Procedure(s): CYSTOSCOPY WITH RIGHT  RETROGRADE PYELOGRAM, URETEROSCOPY AND RIGHT STENT PLACEMENT (Right)  Patient Location: PACU  Anesthesia Type:General  Level of Consciousness: awake, sedated and patient cooperative  Airway & Oxygen Therapy: Patient Spontanous Breathing and Patient connected to face mask oxygen  Post-op Assessment: Report given to RN and Post -op Vital signs reviewed and stable  Post vital signs: Reviewed and stable  Last Vitals:  Filed Vitals:   09/08/14 1740  BP: 148/84  Pulse: 16  Temp: 36.6 C  Resp: 16    Complications: No apparent anesthesia complications

## 2014-09-08 NOTE — Discharge Instructions (Addendum)

## 2014-09-08 NOTE — Anesthesia Preprocedure Evaluation (Signed)
Anesthesia Evaluation  Patient identified by MRN, date of birth, ID band Patient awake    Reviewed: Allergy & Precautions, H&P , NPO status , Patient's Chart, lab work & pertinent test results  Airway Mallampati: II  TM Distance: >3 FB Neck ROM: full    Dental no notable dental hx.    Pulmonary    Pulmonary exam normal       Cardiovascular Exercise Tolerance: Good negative cardio ROS      Neuro/Psych negative neurological ROS     GI/Hepatic negative GI ROS, Neg liver ROS,   Endo/Other  negative endocrine ROS  Renal/GU negative Renal ROS  negative genitourinary   Musculoskeletal   Abdominal Normal abdominal exam  (+)   Peds  Hematology negative hematology ROS (+)   Anesthesia Other Findings   Reproductive/Obstetrics negative OB ROS                             Anesthesia Physical Anesthesia Plan  ASA: II  Anesthesia Plan: General   Post-op Pain Management:    Induction: Intravenous  Airway Management Planned: LMA  Additional Equipment:   Intra-op Plan:   Post-operative Plan:   Informed Consent: I have reviewed the patients History and Physical, chart, labs and discussed the procedure including the risks, benefits and alternatives for the proposed anesthesia with the patient or authorized representative who has indicated his/her understanding and acceptance.     Plan Discussed with: CRNA and Surgeon  Anesthesia Plan Comments:         Anesthesia Quick Evaluation

## 2014-09-11 ENCOUNTER — Encounter (HOSPITAL_COMMUNITY): Payer: Self-pay | Admitting: Urology

## 2014-09-28 ENCOUNTER — Encounter: Payer: Self-pay | Admitting: Family Medicine

## 2014-09-28 ENCOUNTER — Ambulatory Visit (INDEPENDENT_AMBULATORY_CARE_PROVIDER_SITE_OTHER): Payer: BC Managed Care – PPO | Admitting: Family Medicine

## 2014-09-28 VITALS — BP 120/80 | Wt 166.0 lb

## 2014-09-28 DIAGNOSIS — N951 Menopausal and female climacteric states: Secondary | ICD-10-CM

## 2014-09-28 DIAGNOSIS — I82409 Acute embolism and thrombosis of unspecified deep veins of unspecified lower extremity: Secondary | ICD-10-CM

## 2014-09-28 DIAGNOSIS — R232 Flushing: Secondary | ICD-10-CM

## 2014-09-28 MED ORDER — FLUOXETINE HCL 10 MG PO CAPS: 10.0000 mg | ORAL_CAPSULE | Freq: Every day | ORAL | Status: DC

## 2014-09-28 MED ORDER — SERTRALINE HCL 50 MG PO TABS: 50.0000 mg | ORAL_TABLET | Freq: Every day | ORAL | Status: DC

## 2014-09-28 NOTE — Progress Notes (Signed)
   Subjective:    Patient ID: Carol [name], female    DOB: [birth year], 64 y.o.   MRN: [id number]  HPI She is here for consultation. She did have a DVT and presently is on XARELTO. She has stopped her estrogen and unfortunately is having difficulty with hot flashes as well as an inability to sleep.   Review of Systems     Objective:   Physical Exam Alert and in no distress. Homan's sign is negative.       Assessment & Plan:  Hot flashes - Plan: FLUoxetine (PROZAC) 10 MG capsule, sertraline (ZOLOFT) 50 MG tablet  DVT (deep venous thrombosis), unspecified laterality I will place her on Zoloft not Prozac. It was interested in error. She will continue on her [place] for full 6 months. She will let me know how she's doing on the Zoloft.

## 2014-10-11 NOTE — Progress Notes (Signed)
Called requested orders be put in Epic surgery 10-18-14 pre op 10-18-14 [facility]

## 2014-10-13 ENCOUNTER — Other Ambulatory Visit: Payer: Self-pay | Admitting: Urology

## 2014-10-17 ENCOUNTER — Other Ambulatory Visit (HOSPITAL_COMMUNITY): Payer: Self-pay | Admitting: *Deleted

## 2014-10-17 NOTE — Progress Notes (Signed)
Chest xray 07-08-14 epic

## 2014-10-17 NOTE — Patient Instructions (Addendum)
Carol [name]  10/17/2014   Your procedure is scheduled on: Thursday April 28th, 2016  Report to [facility] Main  Entrance and follow signs to               [place] at 1000 AM.  Call this number if you have problems the morning of surgery [phone]   Remember:  Do not eat food or drink liquids :After Midnight.     Take these medicines the morning of surgery with A SIP OF WATER: albuterol inhaler if needed and brinf inhaler, asmanex inhaler lipitor, dexialant                            You may not have any metal on your body including hair pins and              piercings  Do not wear jewelry, make-up, lotions, powders or perfumes.             Do not wear nail polish.  Do not shave  48 hours prior to surgery.              Men may shave face and neck.   Do not bring valuables to the hospital. [place].  Contacts, dentures or bridgework may not be worn into surgery.  Leave suitcase in the car. After surgery it may be brought to your room.     Patients discharged the day of surgery will not be allowed to drive home.  Name and phone number of your driver:wife Charlane Ferretti cell [phone]  Special Instructions: N/A              Please read over the following fact sheets you were given: _____________________________________________________________________             [facility] - Preparing for Surgery Before surgery, you can play an important role.  Because skin is not sterile, your skin needs to be as free of germs as possible.  You can reduce the number of germs on your skin by washing with CHG (chlorahexidine gluconate) soap before surgery.  CHG is an antiseptic cleaner which kills germs and bonds with the skin to continue killing germs even after washing. Please DO NOT use if you have an allergy to CHG or antibacterial soaps.  If your skin becomes reddened/irritated stop using the CHG and  inform your nurse when you arrive at Short Stay. Do not shave (including legs and underarms) for at least 48 hours prior to the first CHG shower.  You may shave your face/neck. Please follow these instructions carefully:  1.  Shower with CHG Soap the night before surgery and the  morning of Surgery.  2.  If you choose to wash your hair, wash your hair first as usual with your  normal  shampoo.  3.  After you shampoo, rinse your hair and body thoroughly to remove the  shampoo.                           4.  Use CHG as you would any other liquid soap.  You can apply chg directly  to the skin and wash  Gently with a scrungie or clean washcloth.  5.  Apply the CHG Soap to your body ONLY FROM THE NECK DOWN.   Do not use on face/ open                           Wound or open sores. Avoid contact with eyes, ears mouth and genitals (private parts).                       Wash face,  Genitals (private parts) with your normal soap.             6.  Wash thoroughly, paying special attention to the area where your surgery  will be performed.  7.  Thoroughly rinse your body with warm water from the neck down.  8.  DO NOT shower/wash with your normal soap after using and rinsing off  the CHG Soap.                9.  Pat yourself dry with a clean towel.            10.  Wear clean pajamas.            11.  Place clean sheets on your bed the night of your first shower and do not  sleep with pets. Day of Surgery : Do not apply any lotions/deodorants the morning of surgery.  Please wear clean clothes to the hospital/surgery center.  FAILURE TO FOLLOW THESE INSTRUCTIONS MAY RESULT IN THE CANCELLATION OF YOUR SURGERY PATIENT SIGNATURE_________________________________  NURSE SIGNATURE__________________________________  ________________________________________________________________________

## 2014-10-18 ENCOUNTER — Encounter (HOSPITAL_COMMUNITY)
Admission: RE | Admit: 2014-10-18 | Discharge: 2014-10-18 | Disposition: A | Discharge status: Home / Self Care | Payer: BC Managed Care – PPO | Source: Ambulatory Visit | Attending: Urology | Admitting: Urology

## 2014-10-18 ENCOUNTER — Encounter (HOSPITAL_COMMUNITY): Payer: Self-pay

## 2014-10-18 DIAGNOSIS — K219 Gastro-esophageal reflux disease without esophagitis: Secondary | ICD-10-CM | POA: Diagnosis not present

## 2014-10-18 DIAGNOSIS — E780 Pure hypercholesterolemia: Secondary | ICD-10-CM | POA: Diagnosis not present

## 2014-10-18 DIAGNOSIS — Z886 Allergy status to analgesic agent status: Secondary | ICD-10-CM | POA: Diagnosis not present

## 2014-10-18 DIAGNOSIS — Z87442 Personal history of urinary calculi: Secondary | ICD-10-CM | POA: Diagnosis not present

## 2014-10-18 DIAGNOSIS — Z91018 Allergy to other foods: Secondary | ICD-10-CM | POA: Diagnosis not present

## 2014-10-18 DIAGNOSIS — Z9851 Tubal ligation status: Secondary | ICD-10-CM | POA: Diagnosis not present

## 2014-10-18 DIAGNOSIS — I82409 Acute embolism and thrombosis of unspecified deep veins of unspecified lower extremity: Secondary | ICD-10-CM | POA: Diagnosis not present

## 2014-10-18 DIAGNOSIS — Z888 Allergy status to other drugs, medicaments and biological substances status: Secondary | ICD-10-CM | POA: Diagnosis not present

## 2014-10-18 DIAGNOSIS — N1330 Unspecified hydronephrosis: Secondary | ICD-10-CM | POA: Diagnosis present

## 2014-10-18 DIAGNOSIS — Z90710 Acquired absence of both cervix and uterus: Secondary | ICD-10-CM | POA: Diagnosis not present

## 2014-10-18 DIAGNOSIS — Z9049 Acquired absence of other specified parts of digestive tract: Secondary | ICD-10-CM | POA: Diagnosis not present

## 2014-10-18 DIAGNOSIS — F419 Anxiety disorder, unspecified: Secondary | ICD-10-CM | POA: Diagnosis not present

## 2014-10-18 DIAGNOSIS — Z91041 Radiographic dye allergy status: Secondary | ICD-10-CM | POA: Diagnosis not present

## 2014-10-18 DIAGNOSIS — J45909 Unspecified asthma, uncomplicated: Secondary | ICD-10-CM | POA: Diagnosis not present

## 2014-10-18 HISTORY — DX: Other specified postprocedural states: Z98.890

## 2014-10-18 HISTORY — DX: Dizziness and giddiness: R42

## 2014-10-18 HISTORY — DX: Nausea with vomiting, unspecified: R11.2

## 2014-10-18 LAB — CBC
HCT: 41 % (ref 36.0–46.0)
Hemoglobin: 13.3 g/dL (ref 12.0–15.0)
MCH: 28.4 pg (ref 26.0–34.0)
MCHC: 32.4 g/dL (ref 30.0–36.0)
MCV: 87.4 fL (ref 78.0–100.0)
PLATELETS: 276 10*3/uL (ref 150–400)
RBC: 4.69 MIL/uL (ref 3.87–5.11)
RDW: 13.6 % (ref 11.5–15.5)
WBC: 7.6 10*3/uL (ref 4.0–10.5)

## 2014-10-18 LAB — BASIC METABOLIC PANEL
Anion gap: 8 (ref 5–15)
BUN: 17 mg/dL (ref 6–23)
CO2: 25 mmol/L (ref 19–32)
Calcium: 9.5 mg/dL (ref 8.4–10.5)
Chloride: 110 mmol/L (ref 96–112)
Creatinine, Ser: 0.75 mg/dL (ref 0.50–1.10)
GFR calc Af Amer: >90 mL/min (ref >90)
GFR calc non Af Amer: 90 mL/min — ABNORMAL LOW (ref >90)
GLUCOSE: 104 mg/dL — AB (ref 70–99)
POTASSIUM: 4.2 mmol/L (ref 3.5–5.1)
Sodium: 143 mmol/L (ref 135–145)

## 2014-10-18 NOTE — H&P (Signed)
Active Problems Problems  1. Hydronephrosis, right (N13.30)   Assessed By: Carolan Clines (Urology); Last Assessed: 18 Oct 2014  History of Present Illness 64 YO female returns today for a 1 week f/u. She states that the flank pain gets worse if she hasn't had a bowel movement in a few days. She was last seen by Dr. Matilde Sprang on 10/06/14 for flank pain, chills & N/V. He reviewed her past notes from Dr Gaynelle Arabian and she saw Diane on September 08, 2014 for right flank pain with vomiting. She had been on potassium citrate. Her stent had actually been removed September 14, 2014 and she had ongoing dilation of the right kidney post stent removal. A Lasix renogram indicated a lack of obstruction. To clarify, on September 18, 2014 her stent was removed. She had been given Toradol and Zofran then. I reviewed Dr Lynne Logan operative note from 09/08/14. She had presented with acute right flank pain, abdominal pain, and nausea and vomiting. He had noted she had chronic right hydronephrosis of unclear etiology. There is a question that she had a possible ureteral stone on CT scan. They did a retrograde and I do not think he actually saw stones. He did ureteroscopy and a 1-2 mm stone was identified and removed with the basket. A stent was inserted.      post op f/u & possible cysto/JJ stent removal. She is s/p cysto/Rt RPG/Rt ureteroscopy/basket extraction/Rt JJ stent placement by Dr. Alinda Money on 09/08/14. Dr. Alinda Money noted that appears that she has chronic Rt hydronephrosis and an anatomic narrowing of the distal ureter that may or may not be functionally significant to cause obstruction of her urine, but could be with the smallest of stones. Dr. Alinda Money recommended that she keep the [place] stent for 1-2 weeks.   Past Medical History Problems  1. History of Anxiety (F41.9) 2. History of Asthma (J45.909) 3. 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 History of esophageal reflux (Z87.19) 5. History of  hypercholesterolemia (Z86.39) 6. History of Murmur (R01.1)  Surgical History Problems  1. History of Bladder Surgery 2. History of Cesarean Section 3. History of Cystoscopy With Insertion Of Ureteral Stent Right 4. History of Cystoscopy With Insertion Of Ureteral Stent Right 5. History of Cystoscopy With Insertion Of Ureteral Stent Right 6. History of Cystoscopy With Ureteroscopy With Lithotripsy 7. History of Cystoscopy With Ureteroscopy With Lithotripsy 8. History of Cystoscopy With Ureteroscopy With Removal Of Calculus 9. History of Cystoscopy With Ureteroscopy With Removal Of Calculus 10. History of Cystoscopy With Ureteroscopy With Removal Of Calculus 11. History of Hysterectomy 12. History of Lithotripsy 13. History of Posterior Colporrhaphy (For Pelvic Relaxation) 14. History of Sacrospinous Ligament Fixation For Posthysterectomy Prolapse 15. History of Tubal Ligation 16. History of Vaginal Surg Insertion Of Mesh For Pelvic Floor Repair  Current Meds 1. Asmanex 120 Metered Doses 220 MCG/INH Inhalation Aerosol Powder Breath Activated;  Therapy: (Recorded:18Mar2016) to Recorded 2. Chlordiazepoxide-Clidinium 5-2.5 MG Oral Capsule;  Therapy: [id number] to Recorded 3. Ciprofloxacin HCl - 250 MG Oral Tablet; TAKE 1 TABLET BID;  Therapy: 15Apr2016 to (Evaluate:22Apr2016); Last Rx:15Apr2016 Ordered 4. Dexilant 60 MG Oral Capsule Delayed Release;  Therapy: 28Jan2011 to Recorded 5. Hydrocodone-Acetaminophen 5-325 MG Oral Tablet; Take 1 tablet every 4 hours;  Therapy: 15Apr2016 to (Evaluate:30Apr2016); Last Rx:20Apr2016 Ordered 6. Lipitor 20 MG Oral Tablet;  Therapy: (Recorded:02May2012) to Recorded 7. Maxalt 10 MG Oral Tablet;  Therapy: 23Apr2012 to Recorded 8. Methocarbamol 500 MG Oral Tablet;  Therapy: (Recorded:18Mar2016) to Recorded 9. Methocarbamol  500 MG Oral Tablet;  Therapy: 23Apr2012 to Recorded 10. Promethazine HCl - 25 MG Oral Tablet; TAKE 1 TABLET Every  6 hours  PRN;   Therapy: 15Apr2016 to (Last Rx:15Apr2016) Ordered 11. Stool Softener CAPS;   Therapy: (Recorded:02May2012) to Recorded 12. Vitamin D (Ergocalciferol) 50000 UNIT Oral Capsule;   Therapy: 10Apr2012 to Recorded 13. Xarelto 20 MG Oral Tablet;   Therapy: (Recorded:18Mar2016) to Recorded 14. Zonisamide 100 MG Oral Capsule;   Therapy: 13Sep2011 to Recorded  Allergies Medication  1. Codeine Derivatives 2. Nitrofurantoin Monohyd Macro CAPS Non-Medication  3. Contrast Dye  Family History Problems  1. Family history of Bladder Cancer : Father 2. Family history of Blood In Urine : Mother 3. Family history of Death In The Family Father : Father   father passed @ age 72staph infection 40. Family history of Death In The Family Mother : Mother   mother passed @ age 79brain bleed 36. Family history of Family Health Status Number Of Children   1 son2 daughters 32. Family history of Nephrolithiasis : Mother  Social History Problems  1. Alcohol Use   2 drinks per week 2. Caffeine Use   no caffeine 3. Marital History - Single   female partner 4. Never A Smoker 5. Occupation:   Materials engineer  Review of Systems Genitourinary, constitutional, skin, eye, otolaryngeal, hematologic/lymphatic, cardiovascular, pulmonary, endocrine, musculoskeletal, gastrointestinal, neurological and psychiatric system(s) were reviewed and pertinent findings if present are noted and are otherwise negative.  Genitourinary: hematuria.  Gastrointestinal: nausea, vomiting, flank pain, heartburn and constipation.  Constitutional: night sweats and feeling tired (fatigue).  Eyes: blurred vision.  Musculoskeletal: back pain.  Neurological: dizziness and headache.    Vitals Vital Signs [Data Includes: Last 1 Day]  Recorded: 27Apr2016 02:24PM  Blood Pressure: 125 / 85 Temperature: 97.7 F Heart Rate: 85  Physical Exam Constitutional: Well nourished and well developed . No acute distress.  ENT:. The  ears and nose are normal in appearance.  Neck: The appearance of the neck is normal and no neck mass is present.  Pulmonary: No respiratory distress and normal respiratory rhythm and effort.  Cardiovascular:. No peripheral edema.  Abdomen: The abdomen is not distended. The abdomen is soft and nontender. No masses are palpated. Mild tenderness in the RLQ is present. moderate right CVA tenderness no CVA tenderness. Bowel sounds are normal. No hernias are palpable. No hepatosplenomegaly noted.  Lymphatics: The femoral and inguinal nodes are not enlarged or tender.  Skin: Normal skin turgor, no visible rash and no visible skin lesions.  Neuro/Psych:. Mood and affect are appropriate.    Results/Data Urine [Data Includes: Last 1 Day]   27Apr2016  COLOR YELLOW   APPEARANCE CLOUDY   SPECIFIC GRAVITY 1.020   pH 6.5   GLUCOSE NEG mg/dL  BILIRUBIN NEG   KETONE NEG mg/dL  BLOOD NEG   PROTEIN NEG mg/dL  UROBILINOGEN 0.2 mg/dL  NITRITE NEG   LEUKOCYTE ESTERASE TRACE   SQUAMOUS EPITHELIAL/HPF RARE   WBC 0-2 WBC/hpf  RBC 0-2 RBC/hpf  BACTERIA RARE   CRYSTALS NONE SEEN   CASTS Hyaline casts noted   Other AMORPHOUS NOTED    Assessment Assessed  1. 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 Hx of DVT. on Xarelto. She will re-start on Xarelto after surgery because of hx of Right sided popliteal phlebitis in January,. 2016. She will discuss with Dr; Demetrios Isaacs.   2. She will RTC for post op with Diane, then will RTC for ? JJ removal.  Plan Health Maintenance  1. UA With REFLEX; [Do Not Release]; Status:Resulted - Requires Verification;   Done:  [id number] 02:16PM  Continue with plans for cysto and JJ stent tomorrow AM. Possible stone removal. Begin Xaralto again post-op. She will discuss with Dr. Demetrios Isaacs.   Discussion/Summary cc: Dr. [name]     Signatures Electronically signed by : Carolan Clines, M.D.; Oct 18 2014  2:40PM EST

## 2014-10-19 ENCOUNTER — Ambulatory Visit (HOSPITAL_COMMUNITY)
Admission: RE | Admit: 2014-10-19 | Discharge: 2014-10-19 | Disposition: A | Discharge status: Home / Self Care | Payer: BC Managed Care – PPO | Source: Ambulatory Visit | Attending: Urology | Admitting: Urology

## 2014-10-19 ENCOUNTER — Encounter (HOSPITAL_COMMUNITY)
Admission: RE | Disposition: A | Discharge status: Home / Self Care | Payer: Self-pay | Source: Ambulatory Visit | Attending: Urology

## 2014-10-19 ENCOUNTER — Ambulatory Visit (HOSPITAL_COMMUNITY): Payer: BC Managed Care – PPO | Admitting: Anesthesiology

## 2014-10-19 ENCOUNTER — Encounter (HOSPITAL_COMMUNITY): Payer: Self-pay | Admitting: *Deleted

## 2014-10-19 DIAGNOSIS — F419 Anxiety disorder, unspecified: Secondary | ICD-10-CM | POA: Insufficient documentation

## 2014-10-19 DIAGNOSIS — Z9049 Acquired absence of other specified parts of digestive tract: Secondary | ICD-10-CM | POA: Insufficient documentation

## 2014-10-19 DIAGNOSIS — N1330 Unspecified hydronephrosis: Secondary | ICD-10-CM | POA: Diagnosis not present

## 2014-10-19 DIAGNOSIS — Z87442 Personal history of urinary calculi: Secondary | ICD-10-CM | POA: Insufficient documentation

## 2014-10-19 DIAGNOSIS — Z888 Allergy status to other drugs, medicaments and biological substances status: Secondary | ICD-10-CM | POA: Insufficient documentation

## 2014-10-19 DIAGNOSIS — Z91018 Allergy to other foods: Secondary | ICD-10-CM | POA: Insufficient documentation

## 2014-10-19 DIAGNOSIS — Z9851 Tubal ligation status: Secondary | ICD-10-CM | POA: Insufficient documentation

## 2014-10-19 DIAGNOSIS — Z91041 Radiographic dye allergy status: Secondary | ICD-10-CM | POA: Insufficient documentation

## 2014-10-19 DIAGNOSIS — Z90710 Acquired absence of both cervix and uterus: Secondary | ICD-10-CM | POA: Insufficient documentation

## 2014-10-19 DIAGNOSIS — K219 Gastro-esophageal reflux disease without esophagitis: Secondary | ICD-10-CM | POA: Insufficient documentation

## 2014-10-19 DIAGNOSIS — Z886 Allergy status to analgesic agent status: Secondary | ICD-10-CM | POA: Insufficient documentation

## 2014-10-19 DIAGNOSIS — I82409 Acute embolism and thrombosis of unspecified deep veins of unspecified lower extremity: Secondary | ICD-10-CM | POA: Insufficient documentation

## 2014-10-19 DIAGNOSIS — E780 Pure hypercholesterolemia: Secondary | ICD-10-CM | POA: Insufficient documentation

## 2014-10-19 DIAGNOSIS — J45909 Unspecified asthma, uncomplicated: Secondary | ICD-10-CM | POA: Insufficient documentation

## 2014-10-19 HISTORY — PX: CYSTOSCOPY WITH RETROGRADE PYELOGRAM, URETEROSCOPY AND STENT PLACEMENT: SHX5789

## 2014-10-19 SURGERY — CYSTOURETEROSCOPY, WITH RETROGRADE PYELOGRAM AND STENT INSERTION
Anesthesia: General | Laterality: Right

## 2014-10-19 MED ORDER — SODIUM CHLORIDE 0.9 % IR SOLN
Status: DC | PRN
  Administered 2014-10-19: 4000 mL

## 2014-10-19 MED ORDER — ONDANSETRON HCL 4 MG/2ML IJ SOLN
INTRAMUSCULAR | Status: DC | PRN
  Administered 2014-10-19: 4 mg via INTRAVENOUS

## 2014-10-19 MED ORDER — FENTANYL CITRATE (PF) 100 MCG/2ML IJ SOLN: 25.0000 ug | INTRAMUSCULAR | Status: DC | PRN

## 2014-10-19 MED ORDER — PROMETHAZINE HCL 25 MG/ML IJ SOLN: 6.2500 mg | INTRAMUSCULAR | Status: DC | PRN

## 2014-10-19 MED ORDER — PROPOFOL 10 MG/ML IV BOLUS
INTRAVENOUS | Status: AC
  Filled 2014-10-19: qty 20

## 2014-10-19 MED ORDER — KETOROLAC TROMETHAMINE 30 MG/ML IJ SOLN
INTRAMUSCULAR | Status: DC | PRN
  Administered 2014-10-19: 30 mg via INTRAVENOUS

## 2014-10-19 MED ORDER — PROPOFOL 10 MG/ML IV BOLUS
INTRAVENOUS | Status: DC | PRN
  Administered 2014-10-19: 200 mg via INTRAVENOUS

## 2014-10-19 MED ORDER — TRIMETHOPRIM 100 MG PO TABS: 100.0000 mg | ORAL_TABLET | ORAL | Status: DC

## 2014-10-19 MED ORDER — CEFAZOLIN SODIUM-DEXTROSE 2-3 GM-% IV SOLR
INTRAVENOUS | Status: AC
  Filled 2014-10-19: qty 50

## 2014-10-19 MED ORDER — MIDAZOLAM HCL 5 MG/5ML IJ SOLN
INTRAMUSCULAR | Status: DC | PRN
  Administered 2014-10-19: 2 mg via INTRAVENOUS

## 2014-10-19 MED ORDER — DEXAMETHASONE SODIUM PHOSPHATE 10 MG/ML IJ SOLN
INTRAMUSCULAR | Status: AC
  Filled 2014-10-19: qty 1

## 2014-10-19 MED ORDER — TRAMADOL-ACETAMINOPHEN 37.5-325 MG PO TABS
1.0000 | ORAL_TABLET | Freq: Once | ORAL | Status: AC
  Administered 2014-10-19: 1 via ORAL
  Filled 2014-10-19: qty 1

## 2014-10-19 MED ORDER — URIBEL 118 MG PO CAPS: 1.0000 | ORAL_CAPSULE | Freq: Two times a day (BID) | ORAL | Status: DC | PRN

## 2014-10-19 MED ORDER — HEPARIN SODIUM (PORCINE) 5000 UNIT/ML IJ SOLN
5000.0000 [IU] | Freq: Once | INTRAMUSCULAR | Status: AC
  Administered 2014-10-19: 5000 [IU] via SUBCUTANEOUS

## 2014-10-19 MED ORDER — KETOROLAC TROMETHAMINE 30 MG/ML IJ SOLN: 30.0000 mg | Freq: Once | INTRAMUSCULAR | Status: DC | PRN

## 2014-10-19 MED ORDER — KETOROLAC TROMETHAMINE 30 MG/ML IJ SOLN
INTRAMUSCULAR | Status: AC
  Filled 2014-10-19: qty 1

## 2014-10-19 MED ORDER — LIDOCAINE HCL (CARDIAC) 20 MG/ML IV SOLN
INTRAVENOUS | Status: AC
  Filled 2014-10-19: qty 5

## 2014-10-19 MED ORDER — TRAMADOL-ACETAMINOPHEN 37.5-325 MG PO TABS: 1.0000 | ORAL_TABLET | Freq: Four times a day (QID) | ORAL | Status: DC | PRN

## 2014-10-19 MED ORDER — ONDANSETRON HCL 4 MG/2ML IJ SOLN
INTRAMUSCULAR | Status: AC
  Filled 2014-10-19: qty 2

## 2014-10-19 MED ORDER — FENTANYL CITRATE (PF) 100 MCG/2ML IJ SOLN
INTRAMUSCULAR | Status: AC
  Filled 2014-10-19: qty 2

## 2014-10-19 MED ORDER — CEFAZOLIN SODIUM-DEXTROSE 2-3 GM-% IV SOLR
2.0000 g | INTRAVENOUS | Status: AC
  Administered 2014-10-19: 2 g via INTRAVENOUS

## 2014-10-19 MED ORDER — DEXAMETHASONE SODIUM PHOSPHATE 10 MG/ML IJ SOLN
INTRAMUSCULAR | Status: DC | PRN
  Administered 2014-10-19: 10 mg via INTRAVENOUS

## 2014-10-19 MED ORDER — FENTANYL CITRATE (PF) 100 MCG/2ML IJ SOLN
INTRAMUSCULAR | Status: DC | PRN
  Administered 2014-10-19 (×2): 50 ug via INTRAVENOUS

## 2014-10-19 MED ORDER — HEPARIN SODIUM (PORCINE) 5000 UNIT/ML IJ SOLN
INTRAMUSCULAR | Status: AC
  Filled 2014-10-19: qty 1

## 2014-10-19 MED ORDER — LIDOCAINE HCL (CARDIAC) 20 MG/ML IV SOLN
INTRAVENOUS | Status: DC | PRN
  Administered 2014-10-19: 100 mg via INTRAVENOUS

## 2014-10-19 MED ORDER — MIDAZOLAM HCL 2 MG/2ML IJ SOLN
INTRAMUSCULAR | Status: AC
  Filled 2014-10-19: qty 2

## 2014-10-19 MED ORDER — LACTATED RINGERS IV SOLN
INTRAVENOUS | Status: DC
  Administered 2014-10-19: 1000 mL via INTRAVENOUS

## 2014-10-19 SURGICAL SUPPLY — 19 items
BAG URO CATCHER STRL LF (DRAPE) ×3 IMPLANT
CATH INTERMIT  6FR 70CM (CATHETERS) ×3 IMPLANT
CLOTH BEACON ORANGE TIMEOUT ST (SAFETY) ×3 IMPLANT
FIBER LASER FLEXIVA 1000 (UROLOGICAL SUPPLIES) IMPLANT
FIBER LASER FLEXIVA 200 (UROLOGICAL SUPPLIES) IMPLANT
FIBER LASER FLEXIVA 365 (UROLOGICAL SUPPLIES) IMPLANT
FIBER LASER FLEXIVA 550 (UROLOGICAL SUPPLIES) IMPLANT
FIBER LASER TRAC TIP (UROLOGICAL SUPPLIES) IMPLANT
GLOVE BIOGEL M STRL SZ7.5 (GLOVE) ×3 IMPLANT
GOWN STRL REUS W/TWL XL LVL3 (GOWN DISPOSABLE) ×3 IMPLANT
GUIDEWIRE STR DUAL SENSOR (WIRE) ×3 IMPLANT
MANIFOLD NEPTUNE II (INSTRUMENTS) ×3 IMPLANT
NS IRRIG 1000ML POUR BTL (IV SOLUTION) ×3 IMPLANT
PACK CYSTO (CUSTOM PROCEDURE TRAY) ×3 IMPLANT
SCRUB PCMX 4 OZ (MISCELLANEOUS) ×3 IMPLANT
SHIELD EYE BINOCULAR (MISCELLANEOUS) IMPLANT
STENT POLARIS 5FRX22 (STENTS) ×2 IMPLANT
TUBING CONNECTING 10 (TUBING) ×2 IMPLANT
TUBING CONNECTING 10' (TUBING) ×1

## 2014-10-19 NOTE — Discharge Instructions (Signed)
Hydronephrosis Hydronephrosis is an abnormal enlargement of your kidney. It can affect one or both the kidneys. It results from the backward pressure of urine on the kidneys, when the flow of urine is blocked. Normally, the urine drains from the kidney through the urine tube (ureter), into a sac which holds the urine until urination (bladder). When the urinary flow is blocked, the urine collects above the block. This causes an increase in the pressure inside the kidney, which in turn leads to its enlargement. The block can occur at the point where the kidney joins the ureter. Treatment depends on the cause and location of the block.  CAUSES  The causes of this condition include:  Birth defect of the kidney or ureter.  Kink at the point where the kidney joins the ureter.  Stones and blood clots in the kidney or ureter.  Cancer, injury, or infection of the ureter.  Scar tissue formation.  Backflow of urine (reflux).  Cancer of bladder or prostate gland.  Abnormality of the nerves or muscles of the kidney or ureter.  Lower part of the ureter protruding into the bladder (ureterocele).  Abnormal contractions of the bladder.  Both the kidneys can be affected during pregnancy. This is because the enlarging uterus presses on the ureters and blocks the flow of urine. SYMPTOMS  The symptoms depend on the location of the block. They also depend on how long the block has been present. You may feel pain on the affected side. Sometimes, you may not have any symptoms. There may be a dull ache or discomfort in the flank. The common symptoms are:  Flank pain.  Swelling of the abdomen.  Pain in the abdomen.  Nausea and vomiting.  Fever.  Pain while passing urine.  Urgency for urination.  Frequent or urgent urination.  Infection of the urinary tract. DIAGNOSIS  Your caregiver will examine you after asking about your symptoms. You may be asked to do blood and urine tests. Your caregiver  may order a special X-ray, ultrasound, or CT scan. Sometimes a rigid or flexible telescope (cystoscope) is used to view the site of the blockage.  TREATMENT  Treatment depends on the site, cause, and duration of the block. The goal of treatment is to remove the blockage. Your caregiver will plan the treatment based on your condition. The different types of treatment are:   Putting in a soft plastic tube (ureteral stent) to connect the bladder with the kidney. This will help in draining the urine.  Putting in a soft tube (nephrostomy tube). This is placed through skin into the kidney. The trapped urine is drained out through the back. A plastic bag is attached to your skin to hold the urine that has drained out.  Antibiotics to treat or prevent infection.  Breaking down of the stone (lithotripsy). HOME CARE INSTRUCTIONS   It may take some time for the hydronephrosis to go away (resolve). Drink fluids as directed by your caregiver , and get a lot of rest.  If you have a drain in, your caregiver will give you directions about how to care for it. Be sure you understand these directions completely before you go home.  Take any antibiotics, pain medications, or other prescriptions exactly as prescribed.  Follow-up with your caregivers as directed. SEEK MEDICAL CARE IF:   You continue to have flank pain, nausea, or difficulty with urination.  You have any problem with any type of drainage device.  Your urine becomes cloudy or bloody. SEEK  IMMEDIATE MEDICAL CARE IF:   You have severe flank and/or abdominal pain.  You develop vomiting and are unable to hold down fluids.  You develop a fever above 100.5 F (38.1 C), or as per your caregiver. MAKE SURE YOU:   Understand these instructions.  Will watch your condition.  Will get help right away if you are not doing well or get worse. Document Released: 04/06/2007 Document Revised: 09/01/2011 Document Reviewed: 05/23/2010 [facility]  Patient Information 2015 [place], [place]. This information is not intended to replace advice given to you by your health care provider. Make sure you discuss any questions you have with your health care provider.

## 2014-10-19 NOTE — Op Note (Signed)
Pre-operative diagnosis :   Right flank pain and right hydronephrosis post basket traction right ureteral calculus  Postoperative diagnosis:  Same  Operation:  Cystoscopy ureteroscopy, right retro-pyelogram interpretation, right ureteroscopy, right double-J stent (5 [place] by 22 Polaris)  Surgeon:  Chauncey Cruel. Gaynelle Arabian, MD  First assistant: None   Anesthesia:  General LMA   Preparation:   After appropriate pre-anesthesia, the patient was brought the operating, placed on the operating table in the dorsal supine position where general LMA anesthesia was introduced. She was then replaced in the dorsal lithotomy position with pubis was prepped with Betadine solution and draped in the usual fashion. The arm band was double checked. The side was appropriately marked preoperatively. It is noted that the patient received subcutaneous heparin because of history of right popliteal phlebitis, and current left popliteal pain 12 hours.   History of Present Illness 64 YO female returns today for a 1 week f/u. She states that the flank pain gets worse if she hasn't had a bowel movement in a few days. She was last seen by Dr. Matilde Sprang on 10/06/14 for flank pain, chills & N/V. He reviewed her past notes from Dr Gaynelle Arabian and she saw Diane on September 08, 2014 for right flank pain with vomiting. She had been on potassium citrate. Her stent had actually been removed September 14, 2014 and she had ongoing dilation of the right kidney post stent removal. A Lasix renogram indicated a lack of obstruction. To clarify, on September 18, 2014 her stent was removed. She had been given Toradol and Zofran then. I reviewed Dr Lynne Logan operative note from 09/08/14. She had presented with acute right flank pain, abdominal pain, and nausea and vomiting. He had noted she had chronic right hydronephrosis of unclear etiology. There is a question that she had a possible ureteral stone on CT scan. They did a retrograde and I do not think he actually saw  stones. He did ureteroscopy and a 1-2 mm stone was identified and removed with the basket. A stent was inserted.     post op f/u & possible cysto/JJ stent removal. She is s/p cysto/Rt RPG/Rt ureteroscopy/basket extraction/Rt JJ stent placement by Dr. Alinda Money on 09/08/14. Dr. Alinda Money noted that appears that she has chronic Rt hydronephrosis and an anatomic narrowing of the distal ureter that may or may not be functionally significant to cause obstruction of her urine, but could be with the smallest of stones. Dr. Alinda Money recommended that she keep the [place] stent for 1-2 weeks.   Past Medical History Problems  1. History of Anxiety (F41.9) 2. History of Asthma (J45.909) 3. 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 History of esophageal reflux (Z87.19) 5. History of hypercholesterolemia (Z86.39) 6. History of Murmur (R01.1)  Statement of  Likelihood of Success: Excellent. TIME-OUT observed.:  Procedure:  Cystourethroscopy is a Compass, which showed normal urethral meatus. The bladder neck was normal. The bladder base also appeared normal. The ureteral orifices were normal bilaterally. Right retro-pyelogram was performed, which showed a narrowed right lower ureter, at the level of the intramural ureter and ureteral vesicle junction. The remaining ureter appeared normal. No stone was in and 5. A 0.038 guidewire was then passed and coiled into the kidney, and following this, the short Wolf ureteroscope was passed into the ureteral orifice without difficulty, and passed through the ureteral orifice, and into the intramural ureter. Edema was noted, but no stone was identified. Proximal to this, the ureter was completely normal. I therefore elected to  place a double-J stent, and a 20-70 m x 5 [place] Polaris catheter was selected, and passed over guidewire, and coiled in the right renal pelvis. Left retrocolic and was also performed to look at the level of the renal pelvis bilaterally and the  level of the lower pole, to ensure the proper positioning of the double-J stent.  With the double-J stent in good position both in the kidney and the bladder, elected to in the procedure. The bladder was drained of fluid, and the patient was awakened and taken to recovery room in good condition. No Foley catheter was deemed necessary.

## 2014-10-19 NOTE — Anesthesia Postprocedure Evaluation (Signed)
  Anesthesia Post-op Note  Patient: Carol [name]  Procedure(s) Performed: Procedure(s) (LRB): CYSTOSCOPY WITH Bilateral RETROGRADE PYELOGRAM/RIGHT URETEROSCOPY,RIGHT JJ STENT (Right)  Patient Location: PACU  Anesthesia Type: General  Level of Consciousness: awake and alert   Airway and Oxygen Therapy: Patient Spontanous Breathing  Post-op Pain: mild  Post-op Assessment: Post-op Vital signs reviewed, Patient's Cardiovascular Status Stable, Respiratory Function Stable, Patent Airway and No signs of Nausea or vomiting  Last Vitals:  Filed Vitals:   10/19/14 1400  BP: 135/64  Pulse: 74  Temp:   Resp: 15    Post-op Vital Signs: stable   Complications: No apparent anesthesia complications

## 2014-10-19 NOTE — Interval H&P Note (Signed)
History and Physical Interval Note:  10/19/2014 11:43 AM  Lavell Anchors  has presented today for surgery, with the diagnosis of RIGHT HYDRONEPHROSIS/RIGHT UVJ OBSTRUCTION  The various methods of treatment have been discussed with the patient and family. After consideration of risks, benefits and other options for treatment, the patient has consented to  Procedure(s): CYSTOSCOPY WITH RIGHT RETROGRADE PYELOGRAM/RIGHT URETEROSCOPY/POSSIBLE RIGHT STONE EXTRACTION/RIGHT JJ STENT (Right) as a surgical intervention .  The patient's history has been reviewed, patient examined, no change in status, stable for surgery.  I have reviewed the patient's chart and labs.  Questions were answered to the patient's satisfaction.   Note: Addendum:S: Pt noted L popliteal fossa pain last night, similar to right side popliteal phlebitis pain from January. She stopped her anticoagulation in anticipation surgery, and is scheduled to restart it again tonight.  O: Exam: L upper thigh and LL leg both warm and non-tender. No swelling. No erythema. No tenderness to dep exam.                  R popliteal fossa : tender medially to deep exam.  A: Pt still ok for ureteroscopy today, as she still has R flank pain, and hydronephrosis. She may need JJ stent. She will re-start Jennye Moccasin tonight per conversation ( yesterday) with Dr. Erskine Speed complete 6 month course.  P: continue plan for R lower ureteroscopy, possible JJ stent.   Shammara Jarrett I Teran Knittle

## 2014-10-19 NOTE — Anesthesia Preprocedure Evaluation (Signed)
Anesthesia Evaluation  Patient identified by MRN, date of birth, ID band Patient awake    Reviewed: Allergy & Precautions, NPO status , Patient's Chart, lab work & pertinent test results  Airway Mallampati: II  TM Distance: >3 FB Neck ROM: Full    Dental no notable dental hx.    Pulmonary neg pulmonary ROS,  breath sounds clear to auscultation  Pulmonary exam normal       Cardiovascular DVT Rhythm:Regular Rate:Normal     Neuro/Psych negative neurological ROS  negative psych ROS   GI/Hepatic Neg liver ROS, GERD-  Medicated,  Endo/Other  negative endocrine ROS  Renal/GU negative Renal ROS  negative genitourinary   Musculoskeletal negative musculoskeletal ROS (+)   Abdominal   Peds negative pediatric ROS (+)  Hematology negative hematology ROS (+)   Anesthesia Other Findings   Reproductive/Obstetrics negative OB ROS                             Anesthesia Physical Anesthesia Plan  ASA: II  Anesthesia Plan: General   Post-op Pain Management:    Induction: Intravenous  Airway Management Planned: LMA  Additional Equipment:   Intra-op Plan:   Post-operative Plan: Extubation in OR  Informed Consent: I have reviewed the patients History and Physical, chart, labs and discussed the procedure including the risks, benefits and alternatives for the proposed anesthesia with the patient or authorized representative who has indicated his/her understanding and acceptance.   Dental advisory given  Plan Discussed with: CRNA and Surgeon  Anesthesia Plan Comments:         Anesthesia Quick Evaluation

## 2014-10-19 NOTE — Transfer of Care (Signed)
Immediate Anesthesia Transfer of Care Note  Patient: Carol [name]  Procedure(s) Performed: Procedure(s): CYSTOSCOPY WITH Bilateral RETROGRADE PYELOGRAM/RIGHT URETEROSCOPY,RIGHT JJ STENT (Right)  Patient Location: PACU  Anesthesia Type:General  Level of Consciousness: sedated  Airway & Oxygen Therapy: Patient Spontanous Breathing and Patient connected to face mask oxygen  Post-op Assessment: Report given to RN and Post -op Vital signs reviewed and stable  Post vital signs: Reviewed and stable  Last Vitals:  Filed Vitals:   10/19/14 0957  BP: 131/76  Pulse: 83  Temp: 36.6 C  Resp: 18    Complications: No apparent anesthesia complications

## 2014-10-20 ENCOUNTER — Encounter (HOSPITAL_COMMUNITY): Payer: Self-pay | Admitting: Urology

## 2014-10-30 ENCOUNTER — Other Ambulatory Visit: Payer: Self-pay | Admitting: Family Medicine

## 2014-11-13 ENCOUNTER — Other Ambulatory Visit (HOSPITAL_COMMUNITY): Payer: Self-pay | Admitting: Urology

## 2014-11-13 DIAGNOSIS — N1330 Unspecified hydronephrosis: Secondary | ICD-10-CM

## 2014-11-13 DIAGNOSIS — R109 Unspecified abdominal pain: Secondary | ICD-10-CM

## 2014-11-16 ENCOUNTER — Ambulatory Visit (HOSPITAL_COMMUNITY)
Admission: RE | Admit: 2014-11-16 | Discharge: 2014-11-16 | Disposition: A | Discharge status: Home / Self Care | Payer: BC Managed Care – PPO | Source: Ambulatory Visit | Attending: Urology | Admitting: Urology

## 2014-11-16 DIAGNOSIS — N1330 Unspecified hydronephrosis: Secondary | ICD-10-CM

## 2014-11-16 DIAGNOSIS — R109 Unspecified abdominal pain: Secondary | ICD-10-CM | POA: Insufficient documentation

## 2014-11-16 MED ORDER — TECHNETIUM TC 99M MERTIATIDE
15.1000 | Freq: Once | INTRAVENOUS | Status: AC | PRN
  Administered 2014-11-16: 15.1 via INTRAVENOUS

## 2014-11-16 MED ORDER — FUROSEMIDE 10 MG/ML IJ SOLN
37.0000 mg | Freq: Once | INTRAMUSCULAR | Status: AC
  Administered 2014-11-16: 37 mg via INTRAVENOUS
  Filled 2014-11-16: qty 4

## 2014-11-22 ENCOUNTER — Other Ambulatory Visit: Payer: Self-pay | Admitting: Medical

## 2014-12-18 ENCOUNTER — Ambulatory Visit (INDEPENDENT_AMBULATORY_CARE_PROVIDER_SITE_OTHER): Payer: BC Managed Care – PPO | Admitting: Family Medicine

## 2014-12-18 VITALS — BP 110/60 | HR 88 | Wt 165.0 lb

## 2014-12-18 DIAGNOSIS — I82409 Acute embolism and thrombosis of unspecified deep veins of unspecified lower extremity: Secondary | ICD-10-CM | POA: Diagnosis not present

## 2014-12-18 NOTE — Progress Notes (Signed)
   Subjective:    Patient ID: Carol [name], female    DOB: 1950-10-22, 64 y.o.   MRN: [id number]  HPI She is here for discussion concerning one to stop XARELTO. She has a history of DVT that was diagnosed in January. She is now 6 months down the road and we are considering stopping the medication at this time. She is now scheduled for pelvic surgery pending resolution of whether to continue XARELTO.   Review of Systems     Objective:   Physical Exam Alert and in no distress otherwise not examined       Assessment & Plan:  DVT (deep venous thrombosis), unspecified laterality Since he is at the end of the 6 months and pelvic surgery is scheduled to think that it would be prudent to cover her for DVT through the surgery. I recommendation would be to stop the XARELTO 3 days prior to surgery and started up postoperatively when she has adequate hemostasis, within 2 or 3 days. We we'll then reassess the issue of continued XARELTO after she is through the postoperative period which is her risk for potentially getting another DVT

## 2014-12-20 ENCOUNTER — Other Ambulatory Visit: Payer: Self-pay | Admitting: Urology

## 2014-12-29 ENCOUNTER — Ambulatory Visit: Payer: BC Managed Care – PPO | Admitting: Family Medicine

## 2015-01-08 ENCOUNTER — Other Ambulatory Visit: Payer: Self-pay | Admitting: Family Medicine

## 2015-01-10 NOTE — Patient Instructions (Signed)
Carol [name]  01/10/2015   Your procedure is scheduled on:  01/19/2015    Report to [facility] Main  Entrance take [place]  elevators to 3rd floor to  [place] at      [place] AM.  Call this number if you have problems the morning of surgery [phone]   Remember: ONLY 1 PERSON MAY GO WITH YOU TO SHORT STAY TO GET  READY MORNING OF YOUR SURGERY.  Do not eat food or drink liquids :After Midnight.     Take these medicines the morning of surgery with A SIP OF WATER:   Albuterol Inhaler if needed and bring, Asmanex Inhaler and bring, Dexilant                                You may not have any metal on your body including hair pins and              piercings  Do not wear jewelry, make-up, lotions, powders or perfumes, deodorant             Do not wear nail polish.  Do not shave  48 hours prior to surgery.                 Do not bring valuables to the hospital. [place].  Contacts, dentures or bridgework may not be worn into surgery.  Leave suitcase in the car. After surgery it may be brought to your room.         Special Instructions: coughing and deep breathing exercises, leg exercises               Please read over the following fact sheets you were given: _____________________________________________________________________             [facility] - Preparing for Surgery Before surgery, you can play an important role.  Because skin is not sterile, your skin needs to be as free of germs as possible.  You can reduce the number of germs on your skin by washing with CHG (chlorahexidine gluconate) soap before surgery.  CHG is an antiseptic cleaner which kills germs and bonds with the skin to continue killing germs even after washing. Please DO NOT use if you have an allergy to CHG or antibacterial soaps.  If your skin becomes reddened/irritated stop using the CHG and inform your nurse when you arrive at  Short Stay. Do not shave (including legs and underarms) for at least 48 hours prior to the first CHG shower.  You may shave your face/neck. Please follow these instructions carefully:  1.  Shower with CHG Soap the night before surgery and the  morning of Surgery.  2.  If you choose to wash your hair, wash your hair first as usual with your  normal  shampoo.  3.  After you shampoo, rinse your hair and body thoroughly to remove the  shampoo.                           4.  Use CHG as you would any other liquid soap.  You can apply chg directly  to the skin and wash  Gently with a scrungie or clean washcloth.  5.  Apply the CHG Soap to your body ONLY FROM THE NECK DOWN.   Do not use on face/ open                           Wound or open sores. Avoid contact with eyes, ears mouth and genitals (private parts).                       Wash face,  Genitals (private parts) with your normal soap.             6.  Wash thoroughly, paying special attention to the area where your surgery  will be performed.  7.  Thoroughly rinse your body with warm water from the neck down.  8.  DO NOT shower/wash with your normal soap after using and rinsing off  the CHG Soap.                9.  Pat yourself dry with a clean towel.            10.  Wear clean pajamas.            11.  Place clean sheets on your bed the night of your first shower and do not  sleep with pets. Day of Surgery : Do not apply any lotions/deodorants the morning of surgery.  Please wear clean clothes to the hospital/surgery center.  FAILURE TO FOLLOW THESE INSTRUCTIONS MAY RESULT IN THE CANCELLATION OF YOUR SURGERY PATIENT SIGNATURE_________________________________  NURSE SIGNATURE__________________________________  ________________________________________________________________________

## 2015-01-12 ENCOUNTER — Encounter (HOSPITAL_COMMUNITY): Payer: Self-pay

## 2015-01-12 ENCOUNTER — Encounter (HOSPITAL_COMMUNITY)
Admission: RE | Admit: 2015-01-12 | Discharge: 2015-01-12 | Disposition: A | Discharge status: Home / Self Care | Payer: BC Managed Care – PPO | Source: Ambulatory Visit | Attending: Urology | Admitting: Urology

## 2015-01-12 DIAGNOSIS — Z01812 Encounter for preprocedural laboratory examination: Secondary | ICD-10-CM | POA: Diagnosis present

## 2015-01-12 DIAGNOSIS — N359 Urethral stricture, unspecified: Secondary | ICD-10-CM | POA: Diagnosis not present

## 2015-01-12 LAB — BASIC METABOLIC PANEL
ANION GAP: 7 (ref 5–15)
BUN: 17 mg/dL (ref 6–20)
CALCIUM: 9.7 mg/dL (ref 8.9–10.3)
CO2: 28 mmol/L (ref 22–32)
CREATININE: 0.95 mg/dL (ref 0.44–1.00)
Chloride: 107 mmol/L (ref 101–111)
GFR calc Af Amer: >60 mL/min (ref >60)
Glucose, Bld: 97 mg/dL (ref 65–99)
Potassium: 4.2 mmol/L (ref 3.5–5.1)
Sodium: 142 mmol/L (ref 135–145)

## 2015-01-12 LAB — CBC
HEMATOCRIT: 42.6 % (ref 36.0–46.0)
Hemoglobin: 14 g/dL (ref 12.0–15.0)
MCH: 28.5 pg (ref 26.0–34.0)
MCHC: 32.9 g/dL (ref 30.0–36.0)
MCV: 86.8 fL (ref 78.0–100.0)
Platelets: 264 10*3/uL (ref 150–400)
RBC: 4.91 MIL/uL (ref 3.87–5.11)
RDW: 12.9 % (ref 11.5–15.5)
WBC: 6.1 10*3/uL (ref 4.0–10.5)

## 2015-01-12 NOTE — Progress Notes (Signed)
EKG- 10/18/2014 EPIC  2V CXR- 06/28/14 EPIC

## 2015-01-19 ENCOUNTER — Inpatient Hospital Stay (HOSPITAL_COMMUNITY): Payer: BC Managed Care – PPO | Admitting: Certified Registered Nurse Anesthetist

## 2015-01-19 ENCOUNTER — Inpatient Hospital Stay (HOSPITAL_COMMUNITY)
Admission: RE | Admit: 2015-01-19 | Discharge: 2015-01-22 | DRG: 660 | Disposition: A | Discharge status: Home / Self Care | Payer: BC Managed Care – PPO | Source: Ambulatory Visit | Attending: Urology | Admitting: Urology

## 2015-01-19 ENCOUNTER — Inpatient Hospital Stay (HOSPITAL_COMMUNITY): Payer: BC Managed Care – PPO

## 2015-01-19 ENCOUNTER — Encounter (HOSPITAL_COMMUNITY)
Admission: RE | Disposition: A | Discharge status: Home / Self Care | Payer: Self-pay | Source: Ambulatory Visit | Attending: Urology

## 2015-01-19 ENCOUNTER — Encounter (HOSPITAL_COMMUNITY): Payer: Self-pay | Admitting: *Deleted

## 2015-01-19 DIAGNOSIS — N131 Hydronephrosis with ureteral stricture, not elsewhere classified: Principal | ICD-10-CM | POA: Diagnosis present

## 2015-01-19 DIAGNOSIS — Z01812 Encounter for preprocedural laboratory examination: Secondary | ICD-10-CM

## 2015-01-19 DIAGNOSIS — M1990 Unspecified osteoarthritis, unspecified site: Secondary | ICD-10-CM | POA: Diagnosis present

## 2015-01-19 DIAGNOSIS — Z885 Allergy status to narcotic agent status: Secondary | ICD-10-CM | POA: Diagnosis not present

## 2015-01-19 DIAGNOSIS — Z91041 Radiographic dye allergy status: Secondary | ICD-10-CM

## 2015-01-19 DIAGNOSIS — Z91018 Allergy to other foods: Secondary | ICD-10-CM | POA: Diagnosis not present

## 2015-01-19 DIAGNOSIS — Z85828 Personal history of other malignant neoplasm of skin: Secondary | ICD-10-CM | POA: Diagnosis not present

## 2015-01-19 DIAGNOSIS — N135 Crossing vessel and stricture of ureter without hydronephrosis: Secondary | ICD-10-CM | POA: Diagnosis present

## 2015-01-19 DIAGNOSIS — K219 Gastro-esophageal reflux disease without esophagitis: Secondary | ICD-10-CM | POA: Diagnosis present

## 2015-01-19 DIAGNOSIS — F40240 Claustrophobia: Secondary | ICD-10-CM | POA: Diagnosis present

## 2015-01-19 DIAGNOSIS — Z809 Family history of malignant neoplasm, unspecified: Secondary | ICD-10-CM | POA: Diagnosis not present

## 2015-01-19 DIAGNOSIS — Z90710 Acquired absence of both cervix and uterus: Secondary | ICD-10-CM | POA: Diagnosis not present

## 2015-01-19 DIAGNOSIS — E785 Hyperlipidemia, unspecified: Secondary | ICD-10-CM | POA: Diagnosis present

## 2015-01-19 DIAGNOSIS — Q6263 Anomalous implantation of ureter: Secondary | ICD-10-CM

## 2015-01-19 DIAGNOSIS — Z87442 Personal history of urinary calculi: Secondary | ICD-10-CM | POA: Diagnosis not present

## 2015-01-19 DIAGNOSIS — F419 Anxiety disorder, unspecified: Secondary | ICD-10-CM | POA: Diagnosis present

## 2015-01-19 HISTORY — PX: ROBOTICALLY ASSISTED LAPAROSCOPIC URETERAL RE-IMPLANTATION: SHX6481

## 2015-01-19 HISTORY — PX: CYSTOSCOPY W/ URETERAL STENT PLACEMENT: SHX1429

## 2015-01-19 LAB — TYPE AND SCREEN
ABO/RH(D): O POS
ANTIBODY SCREEN: NEGATIVE

## 2015-01-19 SURGERY — ROBOTICALLY ASSISTED LAPAROSCOPIC URETERAL RE-IMPLANTATION
Anesthesia: General | Laterality: Right

## 2015-01-19 MED ORDER — CEPHALEXIN 500 MG PO CAPS: 500.0000 mg | ORAL_CAPSULE | Freq: Three times a day (TID) | ORAL | Status: DC

## 2015-01-19 MED ORDER — PANTOPRAZOLE SODIUM 40 MG PO TBEC
40.0000 mg | DELAYED_RELEASE_TABLET | Freq: Every day | ORAL | Status: DC
  Administered 2015-01-19 – 2015-01-21 (×3): 40 mg via ORAL
  Filled 2015-01-19 (×5): qty 1

## 2015-01-19 MED ORDER — HYDROMORPHONE HCL 1 MG/ML IJ SOLN
0.5000 mg | INTRAMUSCULAR | Status: DC | PRN
  Administered 2015-01-19 – 2015-01-22 (×10): 1 mg via INTRAVENOUS
  Filled 2015-01-19 (×10): qty 1

## 2015-01-19 MED ORDER — HYDROMORPHONE HCL 1 MG/ML IJ SOLN
INTRAMUSCULAR | Status: DC | PRN
  Administered 2015-01-19: 0.5 mg via INTRAVENOUS
  Administered 2015-01-19: 1 mg via INTRAVENOUS
  Administered 2015-01-19: 0.5 mg via INTRAVENOUS

## 2015-01-19 MED ORDER — OXYCODONE HCL 5 MG PO TABS
5.0000 mg | ORAL_TABLET | ORAL | Status: DC | PRN
  Administered 2015-01-19 – 2015-01-22 (×8): 5 mg via ORAL
  Filled 2015-01-19 (×8): qty 1

## 2015-01-19 MED ORDER — SODIUM CHLORIDE 0.9 % IJ SOLN
INTRAMUSCULAR | Status: AC
  Filled 2015-01-19: qty 20

## 2015-01-19 MED ORDER — ATORVASTATIN CALCIUM 20 MG PO TABS
20.0000 mg | ORAL_TABLET | Freq: Every day | ORAL | Status: DC
  Administered 2015-01-19 – 2015-01-21 (×3): 20 mg via ORAL
  Filled 2015-01-19 (×4): qty 1

## 2015-01-19 MED ORDER — LACTATED RINGERS IV SOLN: INTRAVENOUS | Status: DC

## 2015-01-19 MED ORDER — ONDANSETRON HCL 4 MG/2ML IJ SOLN: 4.0000 mg | INTRAMUSCULAR | Status: DC | PRN

## 2015-01-19 MED ORDER — FENTANYL CITRATE (PF) 100 MCG/2ML IJ SOLN
INTRAMUSCULAR | Status: AC
  Filled 2015-01-19: qty 2

## 2015-01-19 MED ORDER — MECLIZINE HCL 25 MG PO TABS
25.0000 mg | ORAL_TABLET | Freq: Three times a day (TID) | ORAL | Status: DC | PRN
  Filled 2015-01-19: qty 1

## 2015-01-19 MED ORDER — LACTATED RINGERS IR SOLN
Status: DC | PRN
  Administered 2015-01-19: 1

## 2015-01-19 MED ORDER — ALBUTEROL SULFATE (2.5 MG/3ML) 0.083% IN NEBU: 2.5000 mg | INHALATION_SOLUTION | Freq: Four times a day (QID) | RESPIRATORY_TRACT | Status: DC | PRN

## 2015-01-19 MED ORDER — FENTANYL CITRATE (PF) 100 MCG/2ML IJ SOLN
INTRAMUSCULAR | Status: AC
Start: 2015-01-19 — End: 2015-01-20
  Filled 2015-01-19: qty 2

## 2015-01-19 MED ORDER — SODIUM CHLORIDE 0.9 % IR SOLN
Status: DC | PRN
  Administered 2015-01-19: 3000 mL via INTRAVESICAL

## 2015-01-19 MED ORDER — FENTANYL CITRATE (PF) 100 MCG/2ML IJ SOLN
INTRAMUSCULAR | Status: DC | PRN
  Administered 2015-01-19: 75 ug via INTRAVENOUS
  Administered 2015-01-19 (×3): 50 ug via INTRAVENOUS
  Administered 2015-01-19: 25 ug via INTRAVENOUS

## 2015-01-19 MED ORDER — BUDESONIDE 0.5 MG/2ML IN SUSP
0.5000 mg | Freq: Two times a day (BID) | RESPIRATORY_TRACT | Status: DC
  Administered 2015-01-19 – 2015-01-22 (×6): 0.5 mg via RESPIRATORY_TRACT
  Filled 2015-01-19 (×6): qty 2

## 2015-01-19 MED ORDER — IOHEXOL 300 MG/ML  SOLN
INTRAMUSCULAR | Status: DC | PRN
  Administered 2015-01-19: 5 mL via URETHRAL

## 2015-01-19 MED ORDER — SODIUM CHLORIDE 0.9 % IV SOLN
112.9500 mg | INTRAVENOUS | Status: DC | PRN
  Administered 2015-01-19: 80 mg via INTRAVENOUS

## 2015-01-19 MED ORDER — CHLORDIAZEPOXIDE HCL 5 MG PO CAPS
10.0000 mg | ORAL_CAPSULE | Freq: Every day | ORAL | Status: DC
  Administered 2015-01-19 – 2015-01-21 (×3): 10 mg via ORAL
  Filled 2015-01-19 (×3): qty 2

## 2015-01-19 MED ORDER — GLYCOPYRROLATE 0.2 MG/ML IJ SOLN
INTRAMUSCULAR | Status: DC | PRN
  Administered 2015-01-19: 0.6 mg via INTRAVENOUS

## 2015-01-19 MED ORDER — NEOSTIGMINE METHYLSULFATE 10 MG/10ML IV SOLN
INTRAVENOUS | Status: DC | PRN
Start: 2015-01-19 — End: 2015-01-19
  Administered 2015-01-19: 4 mg via INTRAVENOUS

## 2015-01-19 MED ORDER — PROMETHAZINE HCL 25 MG/ML IJ SOLN: 6.2500 mg | INTRAMUSCULAR | Status: DC | PRN

## 2015-01-19 MED ORDER — SUCCINYLCHOLINE CHLORIDE 20 MG/ML IJ SOLN
INTRAMUSCULAR | Status: DC | PRN
  Administered 2015-01-19: 100 mg via INTRAVENOUS

## 2015-01-19 MED ORDER — PROPOFOL 10 MG/ML IV BOLUS
INTRAVENOUS | Status: AC
  Filled 2015-01-19: qty 20

## 2015-01-19 MED ORDER — ACETAMINOPHEN 500 MG PO TABS
1000.0000 mg | ORAL_TABLET | Freq: Four times a day (QID) | ORAL | Status: AC
  Administered 2015-01-19 – 2015-01-20 (×4): 1000 mg via ORAL
  Filled 2015-01-19 (×7): qty 2

## 2015-01-19 MED ORDER — LACTATED RINGERS IV SOLN
INTRAVENOUS | Status: DC | PRN
Start: 2015-01-19 — End: 2015-01-19
  Administered 2015-01-19 (×3): via INTRAVENOUS

## 2015-01-19 MED ORDER — ROCURONIUM BROMIDE 100 MG/10ML IV SOLN
INTRAVENOUS | Status: AC
Start: 2015-01-19 — End: 2015-01-19
  Filled 2015-01-19: qty 1

## 2015-01-19 MED ORDER — FENTANYL CITRATE (PF) 250 MCG/5ML IJ SOLN
INTRAMUSCULAR | Status: AC
  Filled 2015-01-19: qty 5

## 2015-01-19 MED ORDER — HEMOSTATIC AGENTS (NO CHARGE) OPTIME
TOPICAL | Status: DC | PRN
  Administered 2015-01-19: 1 via TOPICAL

## 2015-01-19 MED ORDER — CEFAZOLIN SODIUM-DEXTROSE 2-3 GM-% IV SOLR
INTRAVENOUS | Status: AC
  Filled 2015-01-19: qty 50

## 2015-01-19 MED ORDER — HYDROCODONE-ACETAMINOPHEN 5-325 MG PO TABS: 1.0000 | ORAL_TABLET | Freq: Four times a day (QID) | ORAL | Status: DC | PRN

## 2015-01-19 MED ORDER — ROCURONIUM BROMIDE 100 MG/10ML IV SOLN
INTRAVENOUS | Status: DC | PRN
  Administered 2015-01-19: 5 mg via INTRAVENOUS
  Administered 2015-01-19 (×2): 10 mg via INTRAVENOUS
  Administered 2015-01-19: 5 mg via INTRAVENOUS
  Administered 2015-01-19: 10 mg via INTRAVENOUS
  Administered 2015-01-19: 40 mg via INTRAVENOUS

## 2015-01-19 MED ORDER — MEPERIDINE HCL 50 MG/ML IJ SOLN: 6.2500 mg | INTRAMUSCULAR | Status: DC | PRN

## 2015-01-19 MED ORDER — DEXAMETHASONE SODIUM PHOSPHATE 10 MG/ML IJ SOLN
INTRAMUSCULAR | Status: AC
Start: 2015-01-19 — End: 2015-01-19
  Filled 2015-01-19: qty 1

## 2015-01-19 MED ORDER — BUPIVACAINE LIPOSOME 1.3 % IJ SUSP
20.0000 mL | Freq: Once | INTRAMUSCULAR | Status: AC
  Administered 2015-01-19: 20 mL
  Filled 2015-01-19: qty 20

## 2015-01-19 MED ORDER — FENTANYL CITRATE (PF) 100 MCG/2ML IJ SOLN
25.0000 ug | INTRAMUSCULAR | Status: DC | PRN
  Administered 2015-01-19 (×3): 50 ug via INTRAVENOUS

## 2015-01-19 MED ORDER — CEFAZOLIN SODIUM-DEXTROSE 2-3 GM-% IV SOLR
2.0000 g | INTRAVENOUS | Status: AC
  Administered 2015-01-19: 2 g via INTRAVENOUS

## 2015-01-19 MED ORDER — MIDAZOLAM HCL 5 MG/5ML IJ SOLN
INTRAMUSCULAR | Status: DC | PRN
  Administered 2015-01-19: 2 mg via INTRAVENOUS

## 2015-01-19 MED ORDER — DEXAMETHASONE SODIUM PHOSPHATE 10 MG/ML IJ SOLN
INTRAMUSCULAR | Status: DC | PRN
  Administered 2015-01-19: 10 mg via INTRAVENOUS

## 2015-01-19 MED ORDER — DEXTROSE-NACL 5-0.45 % IV SOLN
INTRAVENOUS | Status: DC
  Administered 2015-01-19 – 2015-01-22 (×7): via INTRAVENOUS

## 2015-01-19 MED ORDER — HYDROMORPHONE HCL 2 MG/ML IJ SOLN
INTRAMUSCULAR | Status: AC
  Filled 2015-01-19: qty 1

## 2015-01-19 MED ORDER — METOCLOPRAMIDE HCL 5 MG/ML IJ SOLN
INTRAMUSCULAR | Status: DC | PRN
  Administered 2015-01-19: 10 mg via INTRAVENOUS

## 2015-01-19 MED ORDER — ALBUTEROL SULFATE HFA 108 (90 BASE) MCG/ACT IN AERS: 2.0000 | INHALATION_SPRAY | Freq: Four times a day (QID) | RESPIRATORY_TRACT | Status: DC | PRN

## 2015-01-19 MED ORDER — MIDAZOLAM HCL 2 MG/2ML IJ SOLN
INTRAMUSCULAR | Status: AC
  Filled 2015-01-19: qty 2

## 2015-01-19 MED ORDER — ONDANSETRON HCL 4 MG/2ML IJ SOLN
INTRAMUSCULAR | Status: AC
  Filled 2015-01-19: qty 2

## 2015-01-19 MED ORDER — BUPIVACAINE-EPINEPHRINE (PF) 0.25% -1:200000 IJ SOLN
INTRAMUSCULAR | Status: AC
  Filled 2015-01-19: qty 30

## 2015-01-19 MED ORDER — METOCLOPRAMIDE HCL 5 MG/ML IJ SOLN
INTRAMUSCULAR | Status: AC
  Filled 2015-01-19: qty 2

## 2015-01-19 MED ORDER — SODIUM CHLORIDE 0.9 % IJ SOLN
INTRAMUSCULAR | Status: DC | PRN
  Administered 2015-01-19: 20 mL

## 2015-01-19 MED ORDER — ONDANSETRON HCL 4 MG/2ML IJ SOLN
INTRAMUSCULAR | Status: DC | PRN
  Administered 2015-01-19: 4 mg via INTRAVENOUS

## 2015-01-19 MED ORDER — ZONISAMIDE 100 MG PO CAPS
200.0000 mg | ORAL_CAPSULE | Freq: Every day | ORAL | Status: DC
  Administered 2015-01-19 – 2015-01-21 (×3): 200 mg via ORAL
  Filled 2015-01-19 (×3): qty 2

## 2015-01-19 MED ORDER — LIDOCAINE HCL (CARDIAC) 20 MG/ML IV SOLN
INTRAVENOUS | Status: DC | PRN
  Administered 2015-01-19: 80 mg via INTRAVENOUS

## 2015-01-19 MED ORDER — PROPOFOL 10 MG/ML IV BOLUS
INTRAVENOUS | Status: DC | PRN
  Administered 2015-01-19: 130 mg via INTRAVENOUS

## 2015-01-19 SURGICAL SUPPLY — 91 items
APL ESCP 34 STRL LF DISP (HEMOSTASIS) ×2
APPLICATOR COTTON TIP 6IN STRL (MISCELLANEOUS) ×3 IMPLANT
APPLICATOR SURGIFLO ENDO (HEMOSTASIS) ×5 IMPLANT
BAG SPEC RTRVL LRG 6X4 10 (ENDOMECHANICALS)
BLADE SURG SZ10 CARB STEEL (BLADE) ×3 IMPLANT
CABLE HIGH FREQUENCY MONO STRZ (ELECTRODE) ×3 IMPLANT
CATH FOLEY 3WAY  5CC 18FR (CATHETERS) ×2
CATH FOLEY 3WAY 5CC 18FR (CATHETERS) ×1 IMPLANT
CHLORAPREP W/TINT 26ML (MISCELLANEOUS) ×3 IMPLANT
CLIP LIGATING HEM O LOK PURPLE (MISCELLANEOUS) ×6 IMPLANT
CLIP LIGATING HEMO LOK XL GOLD (MISCELLANEOUS) ×3 IMPLANT
COVER TIP SHEARS 8 DVNC (MISCELLANEOUS) ×1 IMPLANT
COVER TIP SHEARS 8MM DA VINCI (MISCELLANEOUS) ×2
DECANTER SPIKE VIAL GLASS SM (MISCELLANEOUS) ×3 IMPLANT
DRAIN CHANNEL 15F RND FF 3/16 (WOUND CARE) ×3 IMPLANT
DRSG TEGADERM 4X4.75 (GAUZE/BANDAGES/DRESSINGS) ×2 IMPLANT
DRSG TEGADERM 6X8 (GAUZE/BANDAGES/DRESSINGS) ×6 IMPLANT
ELECT REM PT RETURN 9FT ADLT (ELECTROSURGICAL) ×3
ELECTRODE REM PT RTRN 9FT ADLT (ELECTROSURGICAL) ×1 IMPLANT
FLOSEAL 10ML (HEMOSTASIS) ×2 IMPLANT
GLOVE BIO SURGEON STRL SZ 6.5 (GLOVE) ×2 IMPLANT
GLOVE BIO SURGEONS STRL SZ 6.5 (GLOVE) ×1
GLOVE BIOGEL M STRL SZ7.5 (GLOVE) ×9 IMPLANT
GOWN STRL REUS W/TWL LRG LVL3 (GOWN DISPOSABLE) ×10 IMPLANT
GUIDEWIRE ANG ZIPWIRE 038X150 (WIRE) ×3 IMPLANT
HOLDER FOLEY CATH W/STRAP (MISCELLANEOUS) ×3 IMPLANT
LIQUID BAND (GAUZE/BANDAGES/DRESSINGS) ×2 IMPLANT
LOOP VESSEL MAXI BLUE (MISCELLANEOUS) ×3 IMPLANT
NDL INSUFFLATION 14GA 120MM (NEEDLE) IMPLANT
NDL SAFETY ECLIPSE 18X1.5 (NEEDLE) ×1 IMPLANT
NEEDLE HYPO 18GX1.5 SHARP (NEEDLE) ×3
NEEDLE INSUFFLATION 14GA 120MM (NEEDLE) ×3 IMPLANT
NS IRRIG 1000ML POUR BTL (IV SOLUTION) ×3 IMPLANT
PACK ROBOT UROLOGY CUSTOM (CUSTOM PROCEDURE TRAY) ×3 IMPLANT
PAD POSITIONING PINK XL (MISCELLANEOUS) ×2 IMPLANT
PLUG CATH AND CAP STER (CATHETERS) ×3 IMPLANT
POUCH SPECIMEN RETRIEVAL 10MM (ENDOMECHANICALS) ×2 IMPLANT
RELOAD LINEAR CUT PROX 55 BLUE (ENDOMECHANICALS) ×6 IMPLANT
RELOAD STAPLE 55 3.8 BLU REG (ENDOMECHANICALS) ×2 IMPLANT
RELOAD STAPLE 60 2.6 WHT THN (STAPLE) ×2 IMPLANT
RELOAD STAPLE 60 3.8 GOLD REG (STAPLE) ×1 IMPLANT
RELOAD STAPLER GOLD 60MM (STAPLE) IMPLANT
RELOAD STAPLER WHITE 60MM (STAPLE) IMPLANT
SET TUBE IRRIG SUCTION NO TIP (IRRIGATION / IRRIGATOR) ×3 IMPLANT
SHEET LAVH (DRAPES) ×3 IMPLANT
SOLUTION ELECTROLUBE (MISCELLANEOUS) ×3 IMPLANT
SPONGE LAP 18X18 X RAY DECT (DISPOSABLE) ×3 IMPLANT
STAPLE ECHEON FLEX 60 POW ENDO (STAPLE) ×3 IMPLANT
STAPLER GUN LINEAR PROX 60 (STAPLE) ×3 IMPLANT
STAPLER PROXIMATE 55 BLUE (STAPLE) ×3 IMPLANT
STAPLER RELOAD GOLD 60MM (STAPLE)
STAPLER RELOAD WHITE 60MM (STAPLE)
STENT CONTOUR 7FRX24X.038 (STENTS) ×2 IMPLANT
STENT URET 6FRX24 CONTOUR (STENTS) ×4 IMPLANT
SURGIFLO W/THROMBIN 8M KIT (HEMOSTASIS) ×3 IMPLANT
SUT ETHILON 3 0 PS 1 (SUTURE) ×2 IMPLANT
SUT MNCRL 3 0 VIOLET RB1 (SUTURE) ×3 IMPLANT
SUT MNCRL AB 4-0 PS2 18 (SUTURE) ×4 IMPLANT
SUT MON AB 2-0 SH 27 (SUTURE) ×6 IMPLANT
SUT MON AB 2-0 SH27 (SUTURE) ×1 IMPLANT
SUT MONOCRYL 3 0 RB1 (SUTURE) ×4
SUT PDS AB 1 CTX 36 (SUTURE) ×6 IMPLANT
SUT PDS AB 2-0 CT2 27 (SUTURE) ×4 IMPLANT
SUT PDS AB 3-0 SH 27 (SUTURE) IMPLANT
SUT PDS AB 4-0 RB1 27 (SUTURE) ×12 IMPLANT
SUT PDS AB 4-0 SH 27 (SUTURE) ×15 IMPLANT
SUT PROLENE 2 0 SH DA (SUTURE) ×3 IMPLANT
SUT SILK 0 (SUTURE) ×6
SUT SILK 0 30XBRD TIE 6 (SUTURE) ×2 IMPLANT
SUT SILK 2 0 (SUTURE) ×3
SUT SILK 2 0 SH CR/8 (SUTURE) ×6 IMPLANT
SUT SILK 2-0 30XBRD TIE 12 (SUTURE) ×1 IMPLANT
SUT SILK 3 0 (SUTURE) ×6
SUT SILK 3 0 12 30 (SUTURE) ×3 IMPLANT
SUT SILK 3-0 18XBRD TIE 12 (SUTURE) ×2 IMPLANT
SUT VIC AB 0 CT1 27 (SUTURE) ×18
SUT VIC AB 0 CT1 27XBRD ANTBC (SUTURE) ×6 IMPLANT
SUT VIC AB 2-0 SH 27 (SUTURE) ×6
SUT VIC AB 2-0 SH 27X BRD (SUTURE) ×2 IMPLANT
SUT VIC AB 3-0 SH 27 (SUTURE) ×3
SUT VIC AB 3-0 SH 27X BRD (SUTURE) ×1 IMPLANT
SUT VIC AB 4-0 RB1 27 (SUTURE) ×12
SUT VIC AB 4-0 RB1 27XBRD (SUTURE) ×4 IMPLANT
SUT VIC AB 4-0 SH 18 (SUTURE) ×3 IMPLANT
SUT VICRYL 0 UR6 27IN ABS (SUTURE) ×3 IMPLANT
SYR 27GX1/2 1ML LL SAFETY (SYRINGE) ×3 IMPLANT
TROCAR 12M 150ML BLUNT (TROCAR) ×3 IMPLANT
TROCAR XCEL 12X100 BLDLESS (ENDOMECHANICALS) ×3 IMPLANT
URINEMETER 200ML W/220 (MISCELLANEOUS) ×3 IMPLANT
WATER STERILE IRR 1500ML POUR (IV SOLUTION) ×6 IMPLANT
YANKAUER SUCT BULB TIP 10FT TU (MISCELLANEOUS) ×3 IMPLANT

## 2015-01-19 NOTE — Transfer of Care (Signed)
Immediate Anesthesia Transfer of Care Note  Patient: Carol [name]  Procedure(s) Performed: Procedure(s): ROBOTICALLY ASSISTED LAPAROSCOPIC URETERAL RE-IMPLANTATION RIGHT (Right) CYSTOSCOPY WITH RETROGRADE PYELOGRAM/URETERAL  RIGHT STENT PLACEMENT (Right)  Patient Location: PACU  Anesthesia Type:General  Level of Consciousness:  sedated, patient cooperative and responds to stimulation  Airway & Oxygen Therapy:Patient Spontanous Breathing and Patient connected to face mask oxgen  Post-op Assessment:  Report given to PACU RN and Post -op Vital signs reviewed and stable  Post vital signs:  Reviewed and stable  Last Vitals:  Filed Vitals:   01/19/15 0844  BP: 140/84  Pulse: 83  Temp: 36.3 C  Resp: 18    Complications: No apparent anesthesia complications

## 2015-01-19 NOTE — Brief Op Note (Signed)
01/19/2015  10:19 AM  PATIENT:  Carol [name]  64 y.o. female  PRE-OPERATIVE DIAGNOSIS:  RIGHT DISTAL URETERAL STRICTURE  POST-OPERATIVE DIAGNOSIS:  * No post-op diagnosis entered *  PROCEDURE:  Procedure(s): ROBOTICALLY ASSISTED LAPAROSCOPIC URETERAL RE-IMPLANTATION (Right) CYSTOSCOPY WITH RETROGRADE PYELOGRAM/URETERAL STENT PLACEMENT (Right)  SURGEON:  Surgeon(s) and Role:    * Alexis Frock, MD - Primary  PHYSICIAN ASSISTANT:   ASSISTANTS: Clemetine Marker, PA    ANESTHESIA:   general  EBL:     BLOOD ADMINISTERED:none  DRAINS: 1 - JP to bulb, 2 - Foley to gravity   LOCAL MEDICATIONS USED:  MARCAINE     SPECIMEN:  Source of Specimen:  Rt distal ureter with stricture  DISPOSITION OF SPECIMEN:  PATHOLOGY  COUNTS:  YES  TOURNIQUET:  * No tourniquets in log *  DICTATION: .Other Dictation: Dictation Number [id number]  PLAN OF CARE: Admit to inpatient   PATIENT DISPOSITION:  PACU - hemodynamically stable.   Delay start of Pharmacological VTE agent (>24hrs) due to surgical blood loss or risk of bleeding: yes

## 2015-01-19 NOTE — H&P (Signed)
Carol [name] is an 64 y.o. female.    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  2014 Renogram: Rt42% / Lt 58 % Rt hydro w/o obstruciton 08/2014 - Rt distal ureteral stone - ureteroscop by Alinda Money, noted distal stricture --> stent out 09/2014 09/2014 - CT persistant large hydro to UVJ w/o stone 10/2014 Renogram: Rt 31% / Lt 69% Rt hydro with obstruction T1/2 70min  2 -  Recurrent Nephrolithiasis - long h/o recurrent stone, nearly all right sided, managed with URS, SWL, MET. Most recent imaging stone free on right, only punctate left renal.   PMH sig for DVR 2016 now on Xarelto. Prolapse repair x 2 (with mesh), vag hyst, lap chole. NO CV disease. Her PCP is Carol Alexanders MD  Today " Carol [name] " is seen to proceed with robotic right ureteral reimplantation for chronic right distal stricture. NO interval fevers.    Past Medical History  Diagnosis Date  . GERD (gastroesophageal reflux disease)   . Herpes simplex 1976  . Right ureteral stone   . H/O hiatal hernia   . Exercise-induced asthma 30's  . Seasonal allergic rhinitis   . Environmental allergies   . Hyperlipidemia   . Anxiety     claustrophobic  . Basal cell carcinoma of forehead 2008; 2014  . Migraine teens, returned in 40"s    "depends on the change in the weather; last one was 1 wk ago" (09/02/2013)  . Arthritis     "neck, hands" (09/02/2013)  . DVT (deep venous thrombosis) 06/28/2014    Dr [employer], right leg, behind knee  . History of kidney stones   . PONV (postoperative nausea and vomiting) 3/ 2015    ponv after gallblaffer  . Dizziness last occurance 10-15-2014    on occasion, not related to activity    Past Surgical History  Procedure Laterality Date  . Posterior pinnacle mesh sacrospinous repair   11-22-2010    RECURRENT ENTEROCELE  . Vault suspension (dermal graft)/ enterocele and rectocele repair  12-05-2005    VAULT PROLAPSE/ ENTERCELE/ RECTOCELE  . Abdominal hysterectomy  1992  . Right ureteroscopic stone extraction w/ stent placement  08-12-2011    x2 stents  . Cystoscopy/retrograde/ureteroscopy/stone extraction with basket  10/21/2011    Procedure: CYSTOSCOPY/RETROGRADE/URETEROSCOPY/STONE EXTRACTION WITH BASKET;  Surgeon: Ailene Rud, MD;  Location: [facility];  Service: Urology;  Laterality: Right;  . Cystoscopy/retrograde/ureteroscopy  11/18/2011    Procedure: CYSTOSCOPY/RETROGRADE/URETEROSCOPY;  Surgeon: Ailene Rud, MD;  Location: [facility];  Service: Urology;  Laterality: Right;  . Cystoscopy w/ ureteral stent placement  03/05/2012    Procedure: CYSTOSCOPY WITH RETROGRADE PYELOGRAM/URETERAL STENT PLACEMENT;  Surgeon: Hanley Ben, MD;  Location: [place];  Service: Urology;  Laterality: Right;  . Cystoscopy/retrograde/ureteroscopy/stone extraction with basket Right 09/10/2012    Procedure: RIGHT URETEROSCOPY/STONE EXTRACTION/LASER LITHOTRIPSY/POSSIBLE DOUBLE J STENT PLACEMENT;  Surgeon: Ailene Rud, MD;  Location: [facility];  Service: Urology;  Laterality: Right;  . Cesarean section  1981  . Tubal ligation  1991  . Mohs surgery  2008; 2014  . Cholecystectomy N/A 09/02/2013    Procedure: LAPAROSCOPIC CHOLECYSTECTOMY;  Surgeon: Harl Bowie, MD;  Location: [place];  Service: General;  Laterality: N/A;  . Cystoscopy with retrograde pyelogram, ureteroscopy and stent placement  Right 09/08/2014    Procedure: CYSTOSCOPY WITH RIGHT  RETROGRADE PYELOGRAM, URETEROSCOPY AND RIGHT STENT PLACEMENT;  Surgeon: Raynelle Bring, MD;  Location: [facility];  Service: Urology;  Laterality: Right;  . Cystoscopy with retrograde pyelogram, ureteroscopy and stent placement Right 10/19/2014    Procedure: CYSTOSCOPY WITH Bilateral RETROGRADE  PYELOGRAM/RIGHT URETEROSCOPY,RIGHT JJ STENT;  Surgeon: Carolan Clines, MD;  Location: [facility];  Service: Urology;  Laterality: Right;    Family History  Problem Relation Age of Onset  . Cancer Father    Social History:  reports that she has never smoked. She has never used smokeless tobacco. She reports that she drinks alcohol. She reports that she does not use illicit drugs.  Allergies:  Allergies  Allergen Reactions  . Iohexol Anaphylaxis     Code: HIVES, Desc: hives during ivp '96, ok w/ 13 hr prep [facility] Onset Date: [id number]   Desc: hives   . Ivp Dye [Iodinated Diagnostic Agents] Anaphylaxis  . Codeine Nausea And Vomiting  . Onion Other (See Comments)    Red Onion Only - Nausea, Vomiting and Passes Out    No prescriptions prior to admission    No results found for this or any previous visit (from the past 48 hour(s)). No results found.  Review of Systems  Constitutional: Negative.  Negative for fever and chills.  HENT: Negative.   Eyes: Negative.   Respiratory: Negative.   Cardiovascular: Negative.   Gastrointestinal: Negative.  Negative for nausea and vomiting.  Genitourinary: Negative.   Musculoskeletal: Negative.   Skin: Negative.   Neurological: Negative.   Endo/Heme/Allergies: Negative.   Psychiatric/Behavioral: Negative.     Last menstrual period 06/23/1990. Physical Exam  Constitutional: She appears well-developed.  HENT:  Head: Normocephalic.  Eyes: Pupils are equal, round, and reactive to light.  Neck: Normal range of motion.  Cardiovascular: Normal rate.   Respiratory: Effort normal.  GI: Soft.  Genitourinary:  No CVAT  Musculoskeletal: Normal range of motion.  Neurological: She is alert.  Skin: Skin is warm.  Psychiatric: She has a normal mood and affect. Her behavior is normal. Judgment and thought content normal.     Assessment/Plan   1 -  Right Ureteral Stricture -  impressive by history and imaging and does appear to become  more clinically significant over time with incrased obstruction / stone risk / and some renal functional loss. Agree given location reimplant offers best chances of durable success with goal of stabilizing renal function / stone prevention.    I reshowed the patient on their abdomen the approximately 4-6 incision (trocar) sites as well as possible open incision sites. We specifically readdressed that there may be need to alter operative plans according to intraopertive findings including conversion to open as need for adjunctive procedures such as ureteral stenting, posas hitch, Boari flap. We rediscussed specific peri-operative risks including bleeding, infection, deep vein thrombosis, pulmonary embolism, compartment syndrome, neuropathy / neuropraxia, heart attack, stroke, death, as well as long-term risks such as non-cure. We rediscussed typical hospital course of approximately 2 day hospitalization, need for peri-operative drains / catheters, and typical post-hospital course with return to most non-strenuous activities by 2 weeks and ability to return to most jobs and more strenuous activity such as exercise by 6 weeks.   After this lengthy and detail discussion, including answering all of the patient's questions to their satisfaction, they have chosen to proceed with right ureteral reimplant today as planned.   2 -  Recurrent Nephrolithiasis - likley driven by  above and oliguria. I am optimistic that reimplant would decrease her stone risk somewhat.    Abigaelle Verley 01/19/2015, 6:22 AM

## 2015-01-19 NOTE — Anesthesia Procedure Notes (Signed)
Procedure Name: Intubation Date/Time: 01/19/2015 10:21 AM Performed by: Montel Clock Pre-anesthesia Checklist: Patient identified, Emergency Drugs available, Suction available, Patient being monitored and Timeout performed Patient Re-evaluated:Patient Re-evaluated prior to inductionOxygen Delivery Method: Circle system utilized Preoxygenation: Pre-oxygenation with 100% oxygen Intubation Type: IV induction Ventilation: Mask ventilation without difficulty and Oral airway inserted - appropriate to patient size Laryngoscope Size: Mac and 3 Grade View: Grade I Tube type: Oral Tube size: 7.5 mm Number of attempts: 1 Airway Equipment and Method: Stylet Placement Confirmation: ETT inserted through vocal cords under direct vision,  positive ETCO2 and breath sounds checked- equal and bilateral Secured at: 20 cm Tube secured with: Tape Dental Injury: Teeth and Oropharynx as per pre-operative assessment

## 2015-01-19 NOTE — Discharge Instructions (Signed)

## 2015-01-19 NOTE — Anesthesia Preprocedure Evaluation (Signed)
Anesthesia Evaluation  Patient identified by MRN, date of birth, ID band Patient awake    Reviewed: Allergy & Precautions, NPO status , Patient's Chart, lab work & pertinent test results  History of Anesthesia Complications (+) PONV and history of anesthetic complications  Airway Mallampati: II  TM Distance: >3 FB Neck ROM: Full    Dental no notable dental hx. (+) Dental Advisory Given   Pulmonary asthma ,  breath sounds clear to auscultation  Pulmonary exam normal       Cardiovascular negative cardio ROS Normal cardiovascular examRhythm:Regular Rate:Normal  DVT in R leg   Neuro/Psych  Headaches, PSYCHIATRIC DISORDERS Anxiety    GI/Hepatic Neg liver ROS, GERD-  Medicated and Controlled,  Endo/Other  negative endocrine ROS  Renal/GU negative Renal ROS  negative genitourinary   Musculoskeletal  (+) Arthritis -, Osteoarthritis,    Abdominal   Peds negative pediatric ROS (+)  Hematology negative hematology ROS (+)   Anesthesia Other Findings   Reproductive/Obstetrics negative OB ROS                             Anesthesia Physical Anesthesia Plan  ASA: II  Anesthesia Plan: General   Post-op Pain Management:    Induction: Intravenous  Airway Management Planned: Oral ETT  Additional Equipment:   Intra-op Plan:   Post-operative Plan: Extubation in OR  Informed Consent: I have reviewed the patients History and Physical, chart, labs and discussed the procedure including the risks, benefits and alternatives for the proposed anesthesia with the patient or authorized representative who has indicated his/her understanding and acceptance.   Dental advisory given  Plan Discussed with: CRNA  Anesthesia Plan Comments:         Anesthesia Quick Evaluation

## 2015-01-19 NOTE — Progress Notes (Signed)
Day of Surgery Subjective: POD 0 s/p RIGHT robot-assisted laparoscopic ureteroneocystostomy. Doing well post-operatively with 4/10 incisional pain improved with IV morphine, has not tried PO medication yet.  denies n/v. Overall doing well.  Objective: Vital signs in last 24 hours: Temp:  [97.3 F (36.3 C)-97.9 F (36.6 C)] 97.7 F (36.5 C) (07/29 1545) Pulse Rate:  [74-85] 74 (07/29 1545) Resp:  [3-18] 16 (07/29 1545) BP: (112-140)/(55-90) 118/67 mmHg (07/29 1545) SpO2:  [98 %-100 %] 100 % (07/29 1545) Weight:  [75.2 kg (165 lb 12.6 oz)-75.297 kg (166 lb)] 75.2 kg (165 lb 12.6 oz) (07/29 1558)  Intake/Output from previous day:   Intake/Output this shift: Total I/O In: 2300 [I.V.:2300] Out: 90 [Urine:40; Blood:50]  Physical Exam:  General: Alert and oriented CV: RRR Lungs: Clear Abdomen: Soft, ND, ATTP Incisions: c/d/i GU: foley draining clear urine Ext: NT, No erythema  Lab Results: No results for input(s): HGB, HCT in the last 72 hours. BMET No results for input(s): NA, K, CL, CO2, GLUCOSE, BUN, CREATININE, CALCIUM in the last 72 hours.   Studies/Results: Dg Retrograde Pyelogram  01/19/2015   CLINICAL DATA:  Right ureteral stent placement.  EXAM: INTRAOPERATIVE RIGHT RETROGRADE UROGRAPHY  TECHNIQUE: Images were obtained with the C-arm fluoroscopic device intraoperatively and submitted for interpretation post-operatively. Please see the procedural report for the amount of contrast and the fluoroscopy time utilized.  COMPARISON:  None.  FINDINGS: Contrast fills the right ureter and renal collecting system. A right ureteral stent extends into the right renal pelvis.  IMPRESSION: See above.   Electronically Signed   By: Marybelle Killings M.D.   On: 01/19/2015 11:21    Assessment/Plan: POD 0 s/p RIGHT robot-assisted laparoscopic ureteroneocystostomy. Doing well  -Continue to monitor -Continue to assess pain and supplement PO with IV   LOS: 0 days   Star Age 01/19/2015,  4:30 PM

## 2015-01-20 LAB — BASIC METABOLIC PANEL
Anion gap: 6 (ref 5–15)
BUN: 10 mg/dL (ref 6–20)
CALCIUM: 8.2 mg/dL — AB (ref 8.9–10.3)
CO2: 24 mmol/L (ref 22–32)
Chloride: 111 mmol/L (ref 101–111)
Creatinine, Ser: 0.8 mg/dL (ref 0.44–1.00)
GFR calc Af Amer: >60 mL/min (ref >60)
GFR calc non Af Amer: >60 mL/min (ref >60)
GLUCOSE: 127 mg/dL — AB (ref 65–99)
Potassium: 3.7 mmol/L (ref 3.5–5.1)
SODIUM: 141 mmol/L (ref 135–145)

## 2015-01-20 LAB — HEMOGLOBIN AND HEMATOCRIT, BLOOD
HCT: 35.6 % — ABNORMAL LOW (ref 36.0–46.0)
Hemoglobin: 11.8 g/dL — ABNORMAL LOW (ref 12.0–15.0)

## 2015-01-20 NOTE — Progress Notes (Signed)
Urology Progress Note  1 Day Post-Op   Subjective:  Blood clot irrigated. Pt not feeling well enough to go home, but possibly tomorrow.      No acute urologic events overnight. Ambulation:   positive Flatus:    positive Bowel movement  negative  Pain: some relief  Objective:  Blood pressure 103/59, pulse 81, temperature 97.8 F (36.6 C), temperature source Oral, resp. rate 18, height 5\' 1"  (1.549 m), weight 75.2 kg (165 lb 12.6 oz), last menstrual period 06/23/1990, SpO2 96 %.  Physical Exam:  General:  No acute distress, awake Extremities: extremities normal, atraumatic, no cyanosis or edema Genitourinary:  neg Foley:yes    I/O last 3 completed shifts: In: 4521.7 [P.O.:650; I.V.:3781.7; Other:90] Out: 1268 [Urine:1215; Drains:3; Blood:50]  Recent Labs     01/20/15  0534  HGB  11.8*    Recent Labs     01/20/15  0534  NA  141  K  3.7  CL  111  CO2  24  BUN  10  CREATININE  0.80  CALCIUM  8.2*  GFRNONAA  >60  GFRAA  >60     No results for input(s): INR, APTT in the last 72 hours.  Invalid input(s): PT   Invalid input(s): ABG  Assessment/Plan:  Follow up in AM

## 2015-01-20 NOTE — Anesthesia Postprocedure Evaluation (Signed)
  Anesthesia Post-op Note  Patient: Carol [name]  Procedure(s) Performed: Procedure(s) (LRB): ROBOTICALLY ASSISTED LAPAROSCOPIC URETERAL RE-IMPLANTATION RIGHT (Right) CYSTOSCOPY WITH RETROGRADE PYELOGRAM/URETERAL  RIGHT STENT PLACEMENT (Right)  Patient Location: PACU  Anesthesia Type: General  Level of Consciousness: awake and alert   Airway and Oxygen Therapy: Patient Spontanous Breathing  Post-op Pain: mild  Post-op Assessment: Post-op Vital signs reviewed, Patient's Cardiovascular Status Stable, Respiratory Function Stable, Patent Airway and No signs of Nausea or vomiting  Last Vitals:  Filed Vitals:   01/20/15 0548  BP: 103/59  Pulse: 81  Temp: 36.6 C  Resp: 18    Post-op Vital Signs: stable   Complications: No apparent anesthesia complications

## 2015-01-20 NOTE — Op Note (Signed)
NAMESEPTEMBER, MORMILE               ACCOUNT NO.:  [id number]  MEDICAL RECORD NO.:  [id number]  LOCATION:  28                         FACILITY:  [facility]  PHYSICIAN:  Carol Frock, MD     DATE OF BIRTH:  11-Jan-1951  DATE OF PROCEDURE: 01/19/2015                              OPERATIVE REPORT   DIAGNOSIS:  Right distal ureteral stricture, symptomatic.  PROCEDURES: 1. Cystoscopy with right retrograde pyelogram and interpretation. 2. Insertion of right ureteral stent, 6 x 24, Contour, no tether. 3. Robotic-assisted laparoscopic right ureteral reimplantation with a     psoas hitch.  ESTIMATED BLOOD LOSS:  100 mL.  COMPLICATIONS:  None.  SPECIMENS:  Right distal ureter with stricture for permanent pathology.  FINDINGS: 1. Mild hydronephrosis with mild relative narrowing of the right     distal ureter, right retrograde pyelogram. 2. Significant inflammatory desmoplastic response around the distal     ureter beginning approximately halfway between the iliac vessels     and bladder.  DRAINS: 1. Jackson-Pratt drain to bulb suction. 2. Foley catheter to straight drain.  ASSISTANT:  Carol [name], P.A.  RESIDENT:  Carol [name], M.D.  INDICATION:  [name] is a pleasant 64 year old lady with history of nephrolithiasis and right-sided flank pain, who was found on workup to have a right distal ureteral stricture, this was symptomatic and leading to obstruction from even very small stones, only 1 mm to 2 mm in diameter.  She was temporized with ureteral stenting, but even with temporary stenting, her symptoms regressed after removal of the stent. She underwent nuclear medicine venogram, which corroborated high-grade right-sided ureteral obstruction with a T1/2 greater than 20 minutes, but in somewhat preserve renal function in the 30% relative function. Options were discussed for management including chronic stenting versus endopyelotomy versus right ureteral reimplant with and  without minimally invasive assistance.  She wished to proceed with the latter.  Informed consent was obtained and placed in the medical record.  PROCEDURE IN DETAIL:  The patient being Carol [name], was verified. Procedure being right ureteral reimplant was confirmed.  Procedure was carried out.  Time-out was performed.  Intravenous antibiotics were administered.  General endotracheal anesthesia was introduced.  The patient was placed into a low lithotomy position and sterile field was created by prepping and draping the patient's vagina, introitus, and proximal thighs using iodine x3.  Next, cystourethroscopy was performed using a 23-French rigid cystoscope with a 30-degree offset lens. Inspection of the urinary bladder revealed no diverticula, calcifications, papillary lesions.  The right ureteral orifice was cannulated with a 6-French end-hole catheter and right retrograde pyelogram was obtained.  Right retrograde pyelogram demonstrated a single right ureter with single-system right kidney.  There was very mild hydroureteronephrosis at the level of the distal ureter, which was only modestly narrowed by retrograde pyelography.  A 0.038 zip wire was advanced at the level of the upper pole, over which, a new 6 X 24 Contour-type stent was placed using cystoscopic and fluoroscopic guidance.  Good proximal and distal deployment were noted.  The patient was then completely re-prepped and draped employing a steep Trendelenburg position at 20 degrees.  Arms tucked, further fashioned on the operative table using  3-inch tape over foam padding across her supraxiphoid chest and each sterile field was created by prepping the patient's vagina, introitus, and proximal thighs using iodine and her infra-xiphoid abdomen using chlorhexidine gluconate.  Next, a high-flow, low-pressure pneumoperitoneum was obtained using Veress technique in the supraumbilical midline having passed the aspiration and  drop test.  Next, a 12-mm robotic camera port was placed into the same location.  Laparoscopic examination of the peritoneal cavity revealed only minimal loose adhesions between colonic mesentery and anterior abdominal wall.  No visceral injury.  Additional ports were placed as follows:  Right paramedian 8-mm robotic port, right far lateral 12-mm assistant port, left paramedian 8-mm robotic port, left far lateral 8-mm robotic port and right paramedian 5-mm suction port.  Robot was docked and passed through electronic checks.  Initial attention was directed to identification of the right ureter.  Incision was made in the posterior peritoneum close to the visualized iliac pulsations.  The posterior peritoneum was carefully developed along the iliac artery.  The right ureter was encountered with the stent in situ. The right ovary which was in situ was carefully mobilized and swept medially.  Ureter was marked with vessel loop and then dissected circumferentially towards the area of the ureterovesical junction.  As expected, the ureter was quite normal in gross appearance to approximately 3-4 cm below the iliac crossing, at which point, it became very severely desmoplastic with significant inflammatory response and densely-adhered fat around this.  This ureter was further mobilized distally circumferentially around this inflammatory aspect towards the area of the ureterovesical junction.  At this point, the ureter was transected halfway.  The stent was removed retrograde and the distal ureteral stump approximated using an extra large Hem-O-Lok clip.  The ureter was then carefully inspected under laparoscopic vision and it was felt that the distal 3 cm of the remaining ureter appeared to be sacrificed, appeared to be quite desmoplastic and this was transected coldly and set aside for permanent pathology.  During this transection, the ureteral stent became inadvertently cut throughout part of  its circumference, so that it was not completely intact.  It was felt that a new ureteral stent would be warranted.  As such, the in-situ stent was completely removed, inspected and intact.  A 0.038 zip wire was advanced to 24 cm and using laparoscopic guidance, a new 6 x 24 stent was placed to 20 cm to the area of ureteral transection as this was where it was previously transected.  The bladder was then carefully mobilized my developing the space of Retzius, dropping the bladder away from the anterior surface of the abdominal wall.  The bladder was then filled with approximately 350 mL of saline and the right lateral aspect was carefully mobilized.  It appeared that the ureter which was cut such that it was approximately 2-3 cm below the iliac crossing, would lie in proper orientation just with minimal psoas hitching alone.  As such, perivesical fibrous fatty tissue was carefully developed, which included peritoneum overlying the bladder creating a perivesical tissue.  This was swung into apposition of the right psoas muscle, thus performing hitching of the perivesical tissue and this laid out in excellent orientation to provide tension-free anastomosis to the right ureter and the superior bladder.  This perivesical pedicle was anchored to the psoas using 2-0 PDS in an interrupted fashion x2 with the psoas bite being parallel to the fibers to avoid any injury to nerves.  This again brought the bladder into tension-free apposition  to the ureter.  Stay sutures were applied approximately 1 cm lateral on each side to the area of planned bladder anastomosis and the bladder was entered coldly for distance approximately 1.5 cm.  The ureteral side was spatulated for distance approximately 1.5 cm and ureteral-to-bladder anastomosis was performed first of a single heel stitch of 3-0 Monocryl followed by two separate running lines of 3-0 Monocryl from the heel stitch anteriorly, one on the medial  side and one on the lateral side, resulted in excellent tension-free apposition of the ureteral mucosa and bladder mucosa.  So, the overlying bladder fatty tissue and peritoneum were swept, thus covering the area of ureteral anastomoses, thus retroperitonealizing it again, and 5 mL of FloSeal was placed across this as well.  Closed suction drain was brought through the previous left lateral most robotic port site into the area of the peritoneal cavity.  Robot was then undocked.  The previous right lateral most assistant port site was closed at the level of the fascia using Eulas Post- Thomason suture passer and 2-0 Vicryl as was the previous camera port. All incision sites were infiltrated with dilute lyophilized Marcaine and closed at the level of the skin using subcuticular Monocryl followed by Dermabond.  Procedure was then terminated.  The patient tolerated the procedure well.  There were no immediate periprocedural complications. The patient was taken to the postanesthesia care unit in stable condition.          ______________________________ Carol Frock, MD     TM/MEDQ  D:  01/19/2015  T:  01/20/2015  Job:  [id number]

## 2015-01-20 NOTE — Progress Notes (Signed)
Hand irrigated pt x1 at this time per order, pt stating she felt like she had to urinate and felt pressure building up in bladder. RN irrigated x3 with 30cc each time. 70 cc yellow urine returned x3. Blood clots noted in foley bag once reattached to catheter tube. Pt expressed feeling relief at this. Will continue to monitor pt closely. Carnella [place] I

## 2015-01-21 MED ORDER — SIMETHICONE 40 MG/0.6ML PO SUSP
40.0000 mg | Freq: Four times a day (QID) | ORAL | Status: DC | PRN
  Administered 2015-01-21: 40 mg via ORAL
  Filled 2015-01-21 (×2): qty 0.6

## 2015-01-21 NOTE — Progress Notes (Signed)
Urology Progress Note  2 Days Post-Op   Subjective: F VSS. Pt walking. Still having pain, now with gas.     No acute urologic events overnight. Ambulation:   positive Flatus:    positive Bowel movement  negative  Pain: some relief  Objective:  Blood pressure 118/72, pulse 92, temperature 98.2 F (36.8 C), temperature source Oral, resp. rate 16, height 5\' 1"  (1.549 m), weight 75.2 kg (165 lb 12.6 oz), last menstrual period 06/23/1990, SpO2 98 %.  Physical Exam:  General:  No acute distress, awake Resp: clear to auscultation bilaterally Genitourinary:  Wounds clear Foley: yes    I/O last 3 completed shifts: In: 3 [P.O.:1610; I.V.:3500; Other:90] Out: 3203 [Urine:3150; Drains:53]  Recent Labs     01/20/15  0534  HGB  11.8*    Recent Labs     01/20/15  0534  NA  141  K  3.7  CL  111  CO2  24  BUN  10  CREATININE  0.80  CALCIUM  8.2*  GFRNONAA  >60  GFRAA  >60     No results for input(s): INR, APTT in the last 72 hours.  Invalid input(s): PT   Invalid input(s): ABG  Assessment/Plan: Gas : will Rx simethicone.  Continue any current medications.

## 2015-01-22 ENCOUNTER — Encounter (HOSPITAL_COMMUNITY): Payer: Self-pay | Admitting: Urology

## 2015-01-22 MED ORDER — SENNOSIDES-DOCUSATE SODIUM 8.6-50 MG PO TABS: 1.0000 | ORAL_TABLET | Freq: Two times a day (BID) | ORAL | Status: DC

## 2015-01-22 NOTE — Discharge Summary (Signed)
Physician Discharge Summary  Patient ID: Carol [name] MRN: [id number] DOB/AGE: 1950-11-16 64 y.o.  Admit date: 01/19/2015 Discharge date: 01/22/2015  Admission Diagnoses:  Discharge Diagnoses:  Active Problems:   Ureteral stricture   Discharged Condition: good  Hospital Course:   1 - Right Distal Ureteral Stricture - pt underwetn cystoscopy, right ureteral stent, and robotic right ureteral reimplant on 01/19/15, they day of admission, without acute complications. She was admittied to the 4th floor Urology service post-op. By POD 3, the day of discharge, she is ambulatory, pain controlled on PO meds, JP drain removed as output scant, afebrile, and felt to be adequate for discharge. She will go hoem with foley in place with plan for cystogram and office removal on follow-up.   Consults: None  Significant Diagnostic Studies: labs: Hgb >10, Cr <1.5.  Treatments: surgery: cystoscopy, right ureteral stent, and robotic right ureteral reimplant on 01/19/15  Discharge Exam: Blood pressure 112/56, pulse 83, temperature 98.3 F (36.8 C), temperature source Oral, resp. rate 16, height 5\' 1"  (1.549 m), weight 75.2 kg (165 lb 12.6 oz), last menstrual period 06/23/1990, SpO2 97 %. General appearance: alert, cooperative, appears stated age and family at bedside Eyes: negative Nose: Nares normal. Septum midline. Mucosa normal. No drainage or sinus tenderness. Throat: lips, mucosa, and tongue normal; teeth and gums normal Neck: supple, symmetrical, trachea midline Back: symmetric, no curvature. ROM normal. No CVA tenderness. Resp: non-labored on room air Cardio: Nl rate GI: soft, non-tender; bowel sounds normal; no masses,  no organomegaly Pelvic: external genitalia normal and foley c/d/i with clear urine.  Extremities: extremities normal, atraumatic, no cyanosis or edema Skin: Skin color, texture, turgor normal. No rashes or lesions Lymph nodes: Cervical, supraclavicular, and axillary nodes  normal. Neurologic: Grossly normal Incision/Wound: recent port sides c/d/i. JP removed and dry dressing applied.   Disposition: 01-Home or Self Care     Medication List    STOP taking these medications        traMADol-acetaminophen 37.5-325 MG per tablet  Commonly known as:  ULTRACET     trimethoprim 100 MG tablet  Commonly known as:  TRIMPEX     Vitamin D (Ergocalciferol) 50000 UNITS Caps capsule  Commonly known as:  DRISDOL     XARELTO 20 MG Tabs tablet  Generic drug:  rivaroxaban      TAKE these medications        albuterol 108 (90 BASE) MCG/ACT inhaler  Commonly known as:  PROVENTIL HFA;VENTOLIN HFA  Inhale 2 puffs into the lungs every 6 (six) hours as needed for wheezing or shortness of breath.     ASMANEX 120 METERED DOSES 220 MCG/INH inhaler  Generic drug:  mometasone  INHALE 1 PUFF AS DIRECTED TWICE A DAY     atorvastatin 20 MG tablet  Commonly known as:  LIPITOR  TAKE 1 TABLET ONCE DAILY.     cephALEXin 500 MG capsule  Commonly known as:  KEFLEX  Take 1 capsule (500 mg total) by mouth 3 (three) times daily. Start day prior to follow up appointment     chlordiazePOXIDE 10 MG capsule  Commonly known as:  LIBRIUM  Take 10 mg by mouth at bedtime.     dexlansoprazole 60 MG capsule  Commonly known as:  DEXILANT  Take 60 mg by mouth every morning.     HYDROcodone-acetaminophen 5-325 MG per tablet  Commonly known as:  NORCO  Take 1-2 tablets by mouth every 6 (six) hours as needed.     meclizine 25  MG tablet  Commonly known as:  ANTIVERT  Take 1 tablet (25 mg total) by mouth 3 (three) times daily as needed for dizziness.     methocarbamol 500 MG tablet  Commonly known as:  ROBAXIN  Take 500 mg by mouth as needed for muscle spasms.     rizatriptan 10 MG tablet  Commonly known as:  MAXALT  Take 10 mg by mouth as needed for migraine. May repeat in 2 hours if needed     URIBEL 118 MG Caps  Take 1 capsule (118 mg total) by mouth 3 times/day as  needed-between meals & bedtime.     valACYclovir 500 MG tablet  Commonly known as:  VALTREX  TAKE 1 TABLET DAILY as directed     zonisamide 100 MG capsule  Commonly known as:  ZONEGRAN  Take 200 mg by mouth at bedtime.           Follow-up Information    Follow up with Alexis Frock, MD On 01/26/2015.   Specialty:  Urology   Why:  at 1:30   Contact information:   [place] [id number] [phone]       Signed: Alexis Frock 01/22/2015, 7:06 AM

## 2015-02-27 ENCOUNTER — Ambulatory Visit: Payer: BC Managed Care – PPO | Admitting: Nurse Practitioner

## 2015-02-28 ENCOUNTER — Other Ambulatory Visit: Payer: Self-pay

## 2015-02-28 DIAGNOSIS — Z1231 Encounter for screening mammogram for malignant neoplasm of breast: Secondary | ICD-10-CM

## 2015-03-01 ENCOUNTER — Telehealth: Payer: Self-pay | Admitting: Family Medicine

## 2015-03-01 NOTE — Telephone Encounter (Signed)
Pt requesting that Dr Redmond School call her at [phone] to let her know if she can start back on Xarelto. Pt says that she had surgery with Dr Tresa Moore on 7/29 and after Dr Tresa Moore and Dr Redmond School talked , pt stopped Xarelto until after the surgery. Dr Tresa Moore told pt that he is ok with her starting it now if Dr Redmond School is ok with it.

## 2015-03-01 NOTE — Telephone Encounter (Signed)
Yes she can start back on XARELTO

## 2015-03-01 NOTE — Telephone Encounter (Signed)
Pt informed she verbalized understanding

## 2015-03-08 ENCOUNTER — Other Ambulatory Visit: Payer: Self-pay | Admitting: Medical

## 2015-03-12 ENCOUNTER — Other Ambulatory Visit: Payer: Self-pay | Admitting: Nurse Practitioner

## 2015-03-12 NOTE — Telephone Encounter (Signed)
Medication refill request: Valtrex  Last AEX:  02/23/14 PG Next AEX: 04/23/15 PG Last MMG (if hormonal medication request): 03/30/14 [id number]:neg Refill authorized: 02/23/14 #[id number]. Today please advise.

## 2015-04-05 ENCOUNTER — Ambulatory Visit: Payer: BC Managed Care – PPO

## 2015-04-16 LAB — HM PAP SMEAR: HM Pap smear: NORMAL

## 2015-04-23 ENCOUNTER — Ambulatory Visit: Payer: BC Managed Care – PPO | Admitting: Nurse Practitioner

## 2015-04-25 ENCOUNTER — Ambulatory Visit
Admission: RE | Admit: 2015-04-25 | Discharge: 2015-04-25 | Disposition: A | Discharge status: Home / Self Care | Payer: BC Managed Care – PPO | Source: Ambulatory Visit

## 2015-04-25 DIAGNOSIS — Z1231 Encounter for screening mammogram for malignant neoplasm of breast: Secondary | ICD-10-CM

## 2015-04-26 ENCOUNTER — Other Ambulatory Visit: Payer: Self-pay | Admitting: Nurse Practitioner

## 2015-04-26 NOTE — Telephone Encounter (Signed)
Medication refill request: Valtrex  Last AEX:  02-23-14  Next AEX: 05-14-14 Last MMG (if hormonal medication request): 03-30-14 WNL Refill authorized: please advise

## 2015-04-27 LAB — HM MAMMOGRAPHY: HM MAMMO: NORMAL (ref 0–4)

## 2015-05-15 ENCOUNTER — Ambulatory Visit (INDEPENDENT_AMBULATORY_CARE_PROVIDER_SITE_OTHER): Payer: BC Managed Care – PPO | Admitting: Nurse Practitioner

## 2015-05-15 ENCOUNTER — Encounter: Payer: Self-pay | Admitting: Nurse Practitioner

## 2015-05-15 VITALS — BP 102/70 | HR 78 | Resp 16 | Ht 61.0 in | Wt 160.0 lb

## 2015-05-15 DIAGNOSIS — Z01419 Encounter for gynecological examination (general) (routine) without abnormal findings: Secondary | ICD-10-CM | POA: Diagnosis not present

## 2015-05-15 DIAGNOSIS — Z0000 Encounter for general adult medical examination without abnormal findings: Secondary | ICD-10-CM

## 2015-05-15 DIAGNOSIS — Z79890 Hormone replacement therapy (postmenopausal): Secondary | ICD-10-CM | POA: Diagnosis not present

## 2015-05-15 LAB — CBC WITH DIFFERENTIAL/PLATELET
BASOS ABS: 0 10*3/uL (ref 0.0–0.1)
Basophils Relative: 0 % (ref 0–1)
EOS ABS: 0.1 10*3/uL (ref 0.0–0.7)
EOS PCT: 1 % (ref 0–5)
HCT: 42.8 % (ref 36.0–46.0)
Hemoglobin: 14.1 g/dL (ref 12.0–15.0)
LYMPHS ABS: 2 10*3/uL (ref 0.7–4.0)
Lymphocytes Relative: 29 % (ref 12–46)
MCH: 28.2 pg (ref 26.0–34.0)
MCHC: 32.9 g/dL (ref 30.0–36.0)
MCV: 85.6 fL (ref 78.0–100.0)
MPV: 9.6 fL (ref 8.6–12.4)
Monocytes Absolute: 0.6 10*3/uL (ref 0.1–1.0)
Monocytes Relative: 8 % (ref 3–12)
Neutro Abs: 4.3 10*3/uL (ref 1.7–7.7)
Neutrophils Relative %: 62 % (ref 43–77)
Platelets: 294 10*3/uL (ref 150–400)
RBC: 5 MIL/uL (ref 3.87–5.11)
RDW: 15.2 % (ref 11.5–15.5)
WBC: 6.9 10*3/uL (ref 4.0–10.5)

## 2015-05-15 LAB — LIPID PANEL
CHOL/HDL RATIO: 3.2 ratio (ref <=5.0)
Cholesterol: 167 mg/dL (ref 125–200)
HDL: 52 mg/dL (ref >=46)
LDL CALC: 76 mg/dL (ref <130)
Triglycerides: 196 mg/dL — ABNORMAL HIGH (ref <150)
VLDL: 39 mg/dL — ABNORMAL HIGH (ref <30)

## 2015-05-15 LAB — COMPREHENSIVE METABOLIC PANEL
ALT: 27 U/L (ref 6–29)
AST: 22 U/L (ref 10–35)
Albumin: 4.4 g/dL (ref 3.6–5.1)
Alkaline Phosphatase: 83 U/L (ref 33–130)
BILIRUBIN TOTAL: 0.4 mg/dL (ref 0.2–1.2)
BUN: 17 mg/dL (ref 7–25)
CALCIUM: 9.3 mg/dL (ref 8.6–10.4)
CO2: 22 mmol/L (ref 20–31)
Chloride: 106 mmol/L (ref 98–110)
Creat: 0.79 mg/dL (ref 0.50–0.99)
Glucose, Bld: 87 mg/dL (ref 65–99)
POTASSIUM: 4.4 mmol/L (ref 3.5–5.3)
Sodium: 140 mmol/L (ref 135–146)
Total Protein: 7 g/dL (ref 6.1–8.1)

## 2015-05-15 LAB — POCT URINALYSIS DIPSTICK
BILIRUBIN UA: NEGATIVE
Blood, UA: NEGATIVE
Glucose, UA: NEGATIVE
KETONES UA: NEGATIVE
Nitrite, UA: NEGATIVE
PH UA: 6
PROTEIN UA: NEGATIVE
Urobilinogen, UA: NEGATIVE

## 2015-05-15 LAB — TSH: TSH: 1.467 u[IU]/mL (ref 0.350–4.500)

## 2015-05-15 MED ORDER — VALACYCLOVIR HCL 500 MG PO TABS: ORAL_TABLET | ORAL | Status: DC

## 2015-05-15 NOTE — Patient Instructions (Signed)

## 2015-05-15 NOTE — Progress Notes (Signed)
Patient ID: Carol [name], female   DOB: 05-Apr-1951, 64 y.o.   MRN: [id number] 64 y.o. [id number] Married  Caucasian Fe here for annual exam.  Still some vaso symptoms.  Had DVT in right leg in January and off Vivelle patches.  Now on Xarelto.  She had Ureteral reimplantation of right ureter with stent secondary to hydronephrosis.  She had previous stones and had significant inflammation and narrowing of the right distal ureter.  stll followed closely with Uro.  Patient's last menstrual period was 06/23/1990.          Sexually active: Yes.    The current method of family planning is post hysterectomy. Same sex partner. Exercising: Yes.    Walking 2-3 times a week. Smoker:  no  Health Maintenance: Pap: 12/30/01, WNL, hyst MMG: 04/25/15, Bi-Rads 1:  Negative Colonoscopy: 05/14/12, 4 polyps, repeat 5 years  BMD: 03/28/13, spine:-1.6/1.2 left femur TDaP: 11/2001 Labs:PCP  Urine: Trace Leuk's - on current antibiotics with Levaquin X 5 days first of November and Trimethoprim as maintenance nightly X 30 days.   reports that she has never smoked. She has never used smokeless tobacco. She reports that she drinks alcohol. She reports that she does not use illicit drugs.  Past Medical History  Diagnosis Date  . GERD (gastroesophageal reflux disease)   . Herpes simplex 1976  . Right ureteral stone   . H/O hiatal hernia   . Exercise-induced asthma 30's  . Seasonal allergic rhinitis   . Environmental allergies   . Hyperlipidemia   . Anxiety     claustrophobic  . Basal cell carcinoma of forehead 2008; 2014  . Migraine teens, returned in 40"s    "depends on the change in the weather; last one was 1 wk ago" (09/02/2013)  . Arthritis     "neck, hands" (09/02/2013)  . DVT (deep venous thrombosis) ([place]) 06/28/2014    Dr Redmond School, right leg, behind knee  . History of kidney stones   . PONV (postoperative nausea and vomiting) 3/ 2015    ponv after gallblaffer  . Dizziness last occurance 10-15-2014    on occasion, not related to activity    Past Surgical History  Procedure Laterality Date  . Posterior pinnacle mesh sacrospinous repair  11-22-2010    RECURRENT ENTEROCELE  . Vault suspension (dermal graft)/ enterocele and rectocele repair  12-05-2005    VAULT PROLAPSE/ ENTERCELE/ RECTOCELE  . Abdominal hysterectomy  1992  . Right ureteroscopic stone extraction w/ stent placement  08-12-2011    x2 stents  . Cystoscopy/retrograde/ureteroscopy/stone extraction with basket  10/21/2011    Procedure: CYSTOSCOPY/RETROGRADE/URETEROSCOPY/STONE EXTRACTION WITH BASKET;  Surgeon: Ailene Rud, MD;  Location: [facility];  Service: Urology;  Laterality: Right;  . Cystoscopy/retrograde/ureteroscopy  11/18/2011    Procedure: CYSTOSCOPY/RETROGRADE/URETEROSCOPY;  Surgeon: Ailene Rud, MD;  Location: [facility];  Service: Urology;  Laterality: Right;  . Cystoscopy w/ ureteral stent placement  03/05/2012    Procedure: CYSTOSCOPY WITH RETROGRADE PYELOGRAM/URETERAL STENT PLACEMENT;  Surgeon: Hanley Ben, MD;  Location: [place];  Service: Urology;  Laterality: Right;  . Cystoscopy/retrograde/ureteroscopy/stone extraction with basket Right 09/10/2012    Procedure: RIGHT URETEROSCOPY/STONE EXTRACTION/LASER LITHOTRIPSY/POSSIBLE DOUBLE J STENT PLACEMENT;  Surgeon: Ailene Rud, MD;  Location: [facility];  Service: Urology;  Laterality: Right;  . Cesarean section  1981  . Tubal ligation  1991  . Mohs surgery  2008; 2014  . Cholecystectomy N/A 09/02/2013    Procedure: LAPAROSCOPIC CHOLECYSTECTOMY;  Surgeon: Harl Bowie,  MD;  Location: [place];  Service: General;  Laterality: N/A;  . Cystoscopy with retrograde pyelogram, ureteroscopy and stent placement Right 09/08/2014    Procedure: CYSTOSCOPY WITH RIGHT  RETROGRADE PYELOGRAM, URETEROSCOPY AND RIGHT STENT PLACEMENT;  Surgeon: Raynelle Bring, MD;  Location: [facility];  Service: Urology;  Laterality: Right;  .  Cystoscopy with retrograde pyelogram, ureteroscopy and stent placement Right 10/19/2014    Procedure: CYSTOSCOPY WITH Bilateral RETROGRADE PYELOGRAM/RIGHT URETEROSCOPY,RIGHT JJ STENT;  Surgeon: Carolan Clines, MD;  Location: [facility];  Service: Urology;  Laterality: Right;  . Robotically assisted laparoscopic ureteral re-implantation Right 01/19/2015    Procedure: ROBOTICALLY ASSISTED LAPAROSCOPIC URETERAL RE-IMPLANTATION RIGHT;  Surgeon: Alexis Frock, MD;  Location: [facility];  Service: Urology;  Laterality: Right;  . Cystoscopy w/ ureteral stent placement Right 01/19/2015    Procedure: CYSTOSCOPY WITH RETROGRADE PYELOGRAM/URETERAL  RIGHT STENT PLACEMENT;  Surgeon: Alexis Frock, MD;  Location: [facility];  Service: Urology;  Laterality: Right;    Current Outpatient Prescriptions  Medication Sig Dispense Refill  . albuterol (PROVENTIL HFA;VENTOLIN HFA) 108 (90 BASE) MCG/ACT inhaler Inhale 2 puffs into the lungs every 6 (six) hours as needed for wheezing or shortness of breath. 1 Inhaler 1  . ASMANEX 120 METERED DOSES 220 MCG/INH inhaler INHALE 1 PUFF AS DIRECTED TWICE A DAY 1 Inhaler 5  . atorvastatin (LIPITOR) 20 MG tablet TAKE 1 TABLET ONCE DAILY. 30 tablet 5  . chlordiazePOXIDE (LIBRIUM) 10 MG capsule Take 10 mg by mouth at bedtime.    Marland Kitchen dexlansoprazole (DEXILANT) 60 MG capsule Take 60 mg by mouth every morning.     . meclizine (ANTIVERT) 25 MG tablet Take 1 tablet (25 mg total) by mouth 3 (three) times daily as needed for dizziness. 30 tablet 0  . methocarbamol (ROBAXIN) 500 MG tablet Take 500 mg by mouth as needed for muscle spasms.    . rizatriptan (MAXALT) 10 MG tablet Take 10 mg by mouth as needed for migraine. May repeat in 2 hours if needed    . senna-docusate (SENOKOT-S) 8.6-50 MG per tablet Take 1 tablet by mouth 2 (two) times daily. While taking pain meds to prevent constipation 30 tablet 3  . valACYclovir (VALTREX) 500 MG tablet TAKE 1 TABLET DAILY AS DIRECTED. 30 tablet 12  . XARELTO  20 MG TABS tablet TAKE 1 TABLET ONCE DAILY WITH SUPPER. 30 tablet 11  . zonisamide (ZONEGRAN) 100 MG capsule Take 200 mg by mouth at bedtime.      No current facility-administered medications for this visit.    Family History  Problem Relation Age of Onset  . Cancer Father     ROS:  Pertinent items are noted in HPI.  Otherwise, a comprehensive ROS was negative.  Exam:   BP 102/70 mmHg  Pulse 78  Resp 16  Ht 5\' 1"  (1.549 m)  Wt 160 lb (72.576 kg)  BMI 30.25 kg/m2  LMP 06/23/1990 Height: 5\' 1"  (154.9 cm) Ht Readings from Last 3 Encounters:  05/15/15 5\' 1"  (1.549 m)  01/19/15 5\' 1"  (1.549 m)  01/12/15 5\' 1"  (1.549 m)    General appearance: alert, cooperative and appears stated age Head: Normocephalic, without obvious abnormality, atraumatic Neck: no adenopathy, supple, symmetrical, trachea midline and thyroid normal to inspection and palpation Lungs: clear to auscultation bilaterally Breasts: normal appearance, no masses or tenderness Heart: regular rate and rhythm Abdomen: soft, non-tender; no masses,  no organomegaly Extremities: extremities normal, atraumatic, no cyanosis or edema Skin: Skin color, texture, turgor normal. No rashes or lesions  Lymph nodes: Cervical, supraclavicular, and axillary nodes normal. No abnormal inguinal nodes palpated Neurologic: Grossly normal   Pelvic: External genitalia:  no lesions              Urethra:  normal appearing urethra with no masses, tenderness or lesions              Bartholin's and Skene's: normal                 Vagina: normal appearing vagina with normal color and discharge, no lesions              Cervix: absent              Pap taken: No. Bimanual Exam:  Uterus:  uterus absent              Adnexa: no mass, fullness, tenderness               Rectovaginal: Confirms               Anus:  normal sphincter tone, no lesions  Chaperone present: yes  A:  Well Woman with normal exam  S/P TAH secondary to uterine prolapse  1992  ERT started 12/29/1999 -  06/2014 (DC secondary to DVT) History of HSV, Vit D deficiency  History of migraine headaches  History of renal calculi with multiple cysto, lithotripsy  S/P Vault Suspension with graft and mesh 3/07 & 6/12 S/P Lap Chole 3/15 S/P right Ureteral re-implantation with cysto and right stent 01/19/15  History of DVT 06/2014 - now on Xarelto   P:   Reviewed health and wellness pertinent to exam  Pap smear as above  Mammogram is due 11/17  Refill on Valtrex for a year  Follow with labs  Counseled on breast self exam, mammography screening, adequate intake of calcium and vitamin D, diet and exercise, Kegel's exercises return annually or prn  An After Visit Summary was printed and given to the patient.  Influenza 04/14/15

## 2015-05-15 NOTE — Progress Notes (Signed)
Encounter reviewed by Dr. Brook Amundson C. Silva.  

## 2015-05-16 LAB — HEPATITIS C ANTIBODY: HCV Ab: NEGATIVE

## 2015-05-16 LAB — HIV ANTIBODY (ROUTINE TESTING W REFLEX): HIV 1&2 Ab, 4th Generation: NONREACTIVE

## 2015-05-16 LAB — VITAMIN D 25 HYDROXY (VIT D DEFICIENCY, FRACTURES): VIT D 25 HYDROXY: 51 ng/mL (ref 30–100)

## 2015-05-22 ENCOUNTER — Other Ambulatory Visit: Payer: Self-pay | Admitting: Urology

## 2015-05-22 DIAGNOSIS — N135 Crossing vessel and stricture of ureter without hydronephrosis: Secondary | ICD-10-CM

## 2015-05-31 ENCOUNTER — Telehealth: Payer: Self-pay | Admitting: Nurse Practitioner

## 2015-05-31 MED ORDER — ZOSTER VACCINE LIVE 19400 UNT/0.65ML ~~LOC~~ SOLR: 0.6500 mL | Freq: Once | SUBCUTANEOUS | Status: DC

## 2015-05-31 NOTE — Telephone Encounter (Signed)
Rx for Zostavax inject 19,400 units into skin once #1 0RF sent to [facility] on file.   Left message to call [place] at [phone].

## 2015-05-31 NOTE — Telephone Encounter (Signed)
Spoke with patient. Advised rx for Zostavax vaccination has been sent to [facility]. Patient can proceed with having the vaccination at her convenience.   Routing to covering provider for final review. Patient agreeable to disposition. Will close encounter.

## 2015-05-31 NOTE — Telephone Encounter (Signed)
[name] wanted patient to get a shingle shot at her pharmacy, she went to [facility] and they said [name] needs to send in prescription for the shingles shot. Best # to reach: [phone]

## 2015-06-11 ENCOUNTER — Other Ambulatory Visit: Payer: Self-pay | Admitting: Nurse Practitioner

## 2015-06-21 ENCOUNTER — Ambulatory Visit (HOSPITAL_COMMUNITY)
Admission: RE | Admit: 2015-06-21 | Discharge: 2015-06-21 | Disposition: A | Discharge status: Home / Self Care | Payer: BC Managed Care – PPO | Source: Ambulatory Visit | Attending: Urology | Admitting: Urology

## 2015-06-21 DIAGNOSIS — N135 Crossing vessel and stricture of ureter without hydronephrosis: Secondary | ICD-10-CM | POA: Diagnosis present

## 2015-06-21 DIAGNOSIS — Z87442 Personal history of urinary calculi: Secondary | ICD-10-CM | POA: Diagnosis not present

## 2015-06-21 MED ORDER — FUROSEMIDE 10 MG/ML IJ SOLN
36.0000 mg | Freq: Once | INTRAMUSCULAR | Status: AC
  Administered 2015-06-21: 36 mg via INTRAVENOUS

## 2015-06-21 MED ORDER — FUROSEMIDE 10 MG/ML IJ SOLN
INTRAMUSCULAR | Status: AC
  Filled 2015-06-21: qty 4

## 2015-06-21 MED ORDER — TECHNETIUM TC 99M MERTIATIDE
16.0000 | Freq: Once | INTRAVENOUS | Status: AC | PRN
  Administered 2015-06-21: 16 via INTRAVENOUS

## 2015-07-28 ENCOUNTER — Other Ambulatory Visit: Payer: Self-pay | Admitting: Family Medicine

## 2015-08-24 ENCOUNTER — Ambulatory Visit (INDEPENDENT_AMBULATORY_CARE_PROVIDER_SITE_OTHER): Payer: BC Managed Care – PPO | Admitting: Family Medicine

## 2015-08-24 ENCOUNTER — Encounter: Payer: Self-pay | Admitting: Family Medicine

## 2015-08-24 VITALS — BP 108/68 | HR 65 | Wt 160.0 lb

## 2015-08-24 DIAGNOSIS — J0100 Acute maxillary sinusitis, unspecified: Secondary | ICD-10-CM | POA: Diagnosis not present

## 2015-08-24 DIAGNOSIS — J4530 Mild persistent asthma, uncomplicated: Secondary | ICD-10-CM

## 2015-08-24 MED ORDER — FLUCONAZOLE 150 MG PO TABS: 150.0000 mg | ORAL_TABLET | Freq: Once | ORAL | Status: DC

## 2015-08-24 MED ORDER — ALBUTEROL SULFATE HFA 108 (90 BASE) MCG/ACT IN AERS: 2.0000 | INHALATION_SPRAY | Freq: Four times a day (QID) | RESPIRATORY_TRACT | Status: DC | PRN

## 2015-08-24 MED ORDER — MOMETASONE FUROATE 220 MCG/INH IN AEPB: INHALATION_SPRAY | RESPIRATORY_TRACT | Status: DC

## 2015-08-24 MED ORDER — AMOXICILLIN 875 MG PO TABS: 875.0000 mg | ORAL_TABLET | Freq: Two times a day (BID) | ORAL | Status: DC

## 2015-08-24 NOTE — Progress Notes (Signed)
   Subjective:    Patient ID: Carol [name], female    DOB: Aug 18, 1950, 65 y.o.   MRN: [id number]  HPI She complains of a one-month history of started with a cough followed by rhinorrhea and 2 weeks later nasal congestion. She then developed some eye pressure and stinging sensation over the last 5 days with maxillary sinus pain upper tooth discomfort, rule out postnasal drainage but no fever, chills, earache, sore throat, cough or congestion. She does have underlying asthma and does need refill on her Asmanex and albuterol. She also has had difficulty recently with recurrent UTI after urologic procedure. She is working with her urologist concerning care of this. Antibiotics do occasionally cause difficulty with yeast vaginitis  Review of Systems     Objective:   Physical Exam Alert and in no distress. Nasal mucosa is red with tenderness over maxillary sinusesTympanic membranes and canals are normal. Pharyngeal area is normal. Neck is supple without adenopathy or thyromegaly. Cardiac exam shows a regular sinus rhythm without murmurs or gallops. Lungs are clear to auscultation.        Assessment & Plan:  Asthma, mild persistent, uncomplicated - Plan: mometasone (ASMANEX 120 METERED DOSES) 220 MCG/INH inhaler, albuterol (PROVENTIL HFA;VENTOLIN HFA) 108 (90 Base) MCG/ACT inhaler, fluconazole (DIFLUCAN) 150 MG tablet  Acute maxillary sinusitis, recurrence not specified - Plan: amoxicillin (AMOXIL) 875 MG tablet will call if not entirely better when she passes antibiotic. She is also views of Diflucan if she has yeast vaginitis.

## 2015-08-26 ENCOUNTER — Other Ambulatory Visit: Payer: Self-pay | Admitting: Family Medicine

## 2015-09-11 ENCOUNTER — Encounter: Payer: Self-pay | Admitting: Family Medicine

## 2015-09-11 ENCOUNTER — Ambulatory Visit (INDEPENDENT_AMBULATORY_CARE_PROVIDER_SITE_OTHER): Payer: BC Managed Care – PPO | Admitting: Family Medicine

## 2015-09-11 ENCOUNTER — Other Ambulatory Visit: Payer: Self-pay | Admitting: Family Medicine

## 2015-09-11 VITALS — BP 120/84 | HR 73 | Resp 14 | Ht 61.0 in | Wt 160.4 lb

## 2015-09-11 DIAGNOSIS — M79661 Pain in right lower leg: Secondary | ICD-10-CM | POA: Diagnosis not present

## 2015-09-11 DIAGNOSIS — Z86718 Personal history of other venous thrombosis and embolism: Secondary | ICD-10-CM

## 2015-09-11 NOTE — Progress Notes (Signed)
   Subjective:    Patient ID: Carol [name], female    DOB: 12/29/1950, 65 y.o.   MRN: [id number]  HPI She complains of a several day history of right calf pain. This occurred after she drove roughly 4 hours to the beach and on her way back had difficulty sitting in the car. She has had no chest pain, shortness of breath, coughing. She presently is on XARELTO for treatment of a previous history of DVT with no identifying cause.   Review of Systems     Objective:   Physical Exam Alert and in no distress. Right calf is positive for Homans sign. It is not warm. The skin appears normal.       Assessment & Plan:  Right calf pain - Plan: US Venous Img Lower Unilateral Right, CANCELED: Ultrasound doppler venous legs bilat  History of DVT (deep vein thrombosis) - Plan: US Venous Img Lower Unilateral Right, CANCELED: Ultrasound doppler venous legs bilat she will continue on Zaroxolyn. Recommended Tylenol for pain relief. If she runs into trouble with chest pain, shortness breath she will call otherwise will wait for the Doppler studies.

## 2015-09-13 ENCOUNTER — Ambulatory Visit
Admission: RE | Admit: 2015-09-13 | Discharge: 2015-09-13 | Disposition: A | Discharge status: Home / Self Care | Payer: BC Managed Care – PPO | Source: Ambulatory Visit | Attending: Family Medicine | Admitting: Family Medicine

## 2015-09-13 DIAGNOSIS — M79661 Pain in right lower leg: Secondary | ICD-10-CM

## 2015-09-13 DIAGNOSIS — Z86718 Personal history of other venous thrombosis and embolism: Secondary | ICD-10-CM

## 2015-09-24 ENCOUNTER — Other Ambulatory Visit: Payer: Self-pay | Admitting: Family Medicine

## 2015-10-15 ENCOUNTER — Encounter: Payer: Self-pay | Admitting: Family Medicine

## 2015-10-15 ENCOUNTER — Ambulatory Visit (INDEPENDENT_AMBULATORY_CARE_PROVIDER_SITE_OTHER): Payer: BC Managed Care – PPO | Admitting: Family Medicine

## 2015-10-15 VITALS — BP 120/82 | HR 76 | Ht 60.5 in | Wt 158.2 lb

## 2015-10-15 DIAGNOSIS — E785 Hyperlipidemia, unspecified: Secondary | ICD-10-CM

## 2015-10-15 DIAGNOSIS — K219 Gastro-esophageal reflux disease without esophagitis: Secondary | ICD-10-CM | POA: Diagnosis not present

## 2015-10-15 DIAGNOSIS — M67479 Ganglion, unspecified ankle and foot: Secondary | ICD-10-CM | POA: Diagnosis not present

## 2015-10-15 DIAGNOSIS — J4530 Mild persistent asthma, uncomplicated: Secondary | ICD-10-CM

## 2015-10-15 DIAGNOSIS — B009 Herpesviral infection, unspecified: Secondary | ICD-10-CM

## 2015-10-15 DIAGNOSIS — N951 Menopausal and female climacteric states: Secondary | ICD-10-CM

## 2015-10-15 DIAGNOSIS — C44310 Basal cell carcinoma of skin of unspecified parts of face: Secondary | ICD-10-CM | POA: Diagnosis not present

## 2015-10-15 DIAGNOSIS — Z0000 Encounter for general adult medical examination without abnormal findings: Secondary | ICD-10-CM | POA: Diagnosis not present

## 2015-10-15 NOTE — Progress Notes (Signed)
Subjective:    Patient ID: Carol [name], female    DOB: March 29, 1951, 65 y.o.   MRN: [id number]  HPI She is here for a complete examination. She has been on Zaroxolyn for quite some time. She was originally placed on it for treatment of DVT and subsequently then had some urologic surgery. The XARELTO was continued. She has now been on this for well over 6 months since her surgery. She also complains of leg and ankle pains mainly occurring at night. She describes the pain is in the anterior calf region as well as cramping of the toes. This has interfered with her sleep. She also has been under a lot of stress due to work-related issues. Her migraines seem to be under good control. She is followed by Dr. Domingo Cocking for this. She has seen Dr. Collene Mares in the past for her reflux and presently is doing fairly well on [place]. Her urologic issues seem to be under good control. She does follow-up with Dr. Tresa Moore. She has a previous history of basal cell cancer of the forehead with Mohs. She is now menopausal and is still having some hot flashes. He also complains of lesion on the plantar surface of her left foot.She rarely has difficulty with her herpes it does occur on her buttock area. Her asthma rarely causes difficulty. Her home life is going well. Family and social history as well as health maintenance immunizations was reviewed.   Review of Systems  All other systems reviewed and are negative.      Objective:   Physical Exam BP 120/82 mmHg  Pulse 76  Ht 5' 0.5" (1.537 m)  Wt 158 lb 3.2 oz (71.759 kg)  BMI 30.38 kg/m2  SpO2 97%  LMP 06/23/1990  General Appearance:    Alert, cooperative, no distress, appears stated age  Head:    Normocephalic, without obvious abnormality, atraumatic  Eyes:    PERRL, conjunctiva/corneas clear, EOM's intact, fundi    benign  Ears:    Normal TM's and external ear canals  Nose:   Nares normal, mucosa normal, no drainage or sinus   tenderness  Throat:   Lips,  mucosa, and tongue normal; teeth and gums normal  Neck:   Supple, no lymphadenopathy;  thyroid:  no   enlargement/tenderness/nodules; no carotid   bruit or JVD  Back:    Spine nontender, no curvature, ROM normal, no CVA     tenderness  Lungs:     Clear to auscultation bilaterally without wheezes, rales or     ronchi; respirations unlabored  Chest Wall:    No tenderness or deformity   Heart:    Regular rate and rhythm, S1 and S2 normal, no murmur, rub   or gallop  Breast Exam:    Deferred to GYN  Abdomen:     Soft, non-tender, nondistended, normoactive bowel sounds,    no masses, no hepatosplenomegaly  Genitalia:    Deferred to GYN     Extremities:   No clubbing, cyanosis or edema. Cystic lesion over the plantar surface of the left foot noted.  Pulses:   2+ and symmetric all extremities  Skin:   Skin color, texture, turgor normal, no rashes or lesions  Lymph nodes:   Cervical, supraclavicular, and axillary nodes normal  Neurologic:   CNII-XII intact, normal strength, sensation and gait; reflexes 2+ and symmetric throughout          Psych:   Normal mood, affect, hygiene and grooming.   Blood work from  November was reviewed.       Assessment & Plan:  BCE (basal cell epithelioma), face  Asthma, mild persistent, uncomplicated  Gastroesophageal reflux disease, esophagitis presence not specified  Herpes  Hyperlipidemia with target LDL less than 100  Hot flashes, menopausal  Ganglion cyst of foot  Routine general medical examination at a health care facility discussed stopping the XARELTO and I think at this point it is reasonable to do that. Also recommend using the Dexilant to every other day and even spreading it out further if possible. Discussed treatment of her leg cramps with stretching, massage. Her sleep issues seem to be revolving around distress at work. Discussed stress and stress management and also gave information on sleep hygiene. If she has more difficulty with the  foot lesions, referral to orthopedics will be made.

## 2015-10-15 NOTE — Patient Instructions (Addendum)
Do some heat and stretching as well as massage for your feet. Insomnia Insomnia is a sleep disorder that makes it difficult to fall asleep or to stay asleep. Insomnia can cause tiredness (fatigue), low energy, difficulty concentrating, mood swings, and poor performance at work or school.  There are three different ways to classify insomnia:  Difficulty falling asleep.  Difficulty staying asleep.  Waking up too early in the morning. Any type of insomnia can be long-term (chronic) or short-term (acute). Both are common. Short-term insomnia usually lasts for three months or less. Chronic insomnia occurs at least three times a week for longer than three months. CAUSES  Insomnia may be caused by another condition, situation, or substance, such as:  Anxiety.  Certain medicines.  Gastroesophageal reflux disease (GERD) or other gastrointestinal conditions.  Asthma or other breathing conditions.  Restless legs syndrome, sleep apnea, or other sleep disorders.  Chronic pain.  Menopause. This may include hot flashes.  Stroke.  Abuse of alcohol, tobacco, or illegal drugs.  Depression.  Caffeine.   Neurological disorders, such as Alzheimer disease.  An overactive thyroid (hyperthyroidism). The cause of insomnia may not be known. RISK FACTORS Risk factors for insomnia include:  Gender. Women are more commonly affected than men.  Age. Insomnia is more common as you get older.  Stress. This may involve your professional or personal life.  Income. Insomnia is more common in people with lower income.  Lack of exercise.   Irregular work schedule or night shifts.  Traveling between different time zones. SIGNS AND SYMPTOMS If you have insomnia, trouble falling asleep or trouble staying asleep is the main symptom. This may lead to other symptoms, such as:  Feeling fatigued.  Feeling nervous about going to sleep.  Not feeling rested in the morning.  Having trouble  concentrating.  Feeling irritable, anxious, or depressed. TREATMENT  Treatment for insomnia depends on the cause. If your insomnia is caused by an underlying condition, treatment will focus on addressing the condition. Treatment may also include:   Medicines to help you sleep.  Counseling or therapy.  Lifestyle adjustments. HOME CARE INSTRUCTIONS   Take medicines only as directed by your health care provider.  Keep regular sleeping and waking hours. Avoid naps.  Keep a sleep diary to help you and your health care provider figure out what could be causing your insomnia. Include:   When you sleep.  When you wake up during the night.  How well you sleep.   How rested you feel the next day.  Any side effects of medicines you are taking.  What you eat and drink.   Make your bedroom a comfortable place where it is easy to fall asleep:  Put up shades or special blackout curtains to block light from outside.  Use a white noise machine to block noise.  Keep the temperature cool.   Exercise regularly as directed by your health care provider. Avoid exercising right before bedtime.  Use relaxation techniques to manage stress. Ask your health care provider to suggest some techniques that may work well for you. These may include:  Breathing exercises.  Routines to release muscle tension.  Visualizing peaceful scenes.  Cut back on alcohol, caffeinated beverages, and cigarettes, especially close to bedtime. These can disrupt your sleep.  Do not overeat or eat spicy foods right before bedtime. This can lead to digestive discomfort that can make it hard for you to sleep.  Limit screen use before bedtime. This includes:  Watching TV.  Using your smartphone, tablet, and computer.  Stick to a routine. This can help you fall asleep faster. Try to do a quiet activity, brush your teeth, and go to bed at the same time each night.  Get out of bed if you are still awake after 15  minutes of trying to sleep. Keep the lights down, but try reading or doing a quiet activity. When you feel sleepy, go back to bed.  Make sure that you drive carefully. Avoid driving if you feel very sleepy.  Keep all follow-up appointments as directed by your health care provider. This is important. SEEK MEDICAL CARE IF:   You are tired throughout the day or have trouble in your daily routine due to sleepiness.  You continue to have sleep problems or your sleep problems get worse. SEEK IMMEDIATE MEDICAL CARE IF:   You have serious thoughts about hurting yourself or someone else.   This information is not intended to replace advice given to you by your health care provider. Make sure you discuss any questions you have with your health care provider.   Document Released: 06/06/2000 Document Revised: 02/28/2015 Document Reviewed: 03/10/2014 Elsevier Interactive Patient Education [employer].

## 2015-10-29 ENCOUNTER — Other Ambulatory Visit: Payer: Self-pay | Admitting: Family Medicine

## 2015-11-06 ENCOUNTER — Other Ambulatory Visit: Payer: Self-pay | Admitting: Family Medicine

## 2015-11-06 NOTE — Telephone Encounter (Signed)
Is this okay to refill? 

## 2015-11-12 ENCOUNTER — Other Ambulatory Visit: Payer: Self-pay

## 2015-11-12 ENCOUNTER — Telehealth: Payer: Self-pay | Admitting: Family Medicine

## 2015-11-12 NOTE — Telephone Encounter (Signed)
Please see message below  Also, please review notes from last visit re xarelto, may need to be changed

## 2015-11-12 NOTE — Telephone Encounter (Signed)
Please call patient re Xarelto RX Patient states she was told at last visit that you were discontinuing xarelto so she stopped taking Got a call to pick uop rx at pharmacy and it was rx for xarelto that pharmacy stated we called in

## 2015-11-12 NOTE — Telephone Encounter (Signed)
Let  Her knowthat the pharmacy pharmacy sent through and I did not check it. Discontinue it and tell her she does not need to take it.

## 2015-11-12 NOTE — Telephone Encounter (Signed)
Left message on machine for pt she is not suppose to be on xerellto to not take it

## 2015-12-04 ENCOUNTER — Telehealth: Payer: Self-pay

## 2015-12-04 NOTE — Telephone Encounter (Signed)
Records faxed to [facility] per pt signed request. Carol [name] December

## 2016-02-19 ENCOUNTER — Ambulatory Visit (INDEPENDENT_AMBULATORY_CARE_PROVIDER_SITE_OTHER): Payer: BC Managed Care – PPO | Admitting: Family Medicine

## 2016-02-19 ENCOUNTER — Encounter: Payer: Self-pay | Admitting: Family Medicine

## 2016-02-19 VITALS — BP 120/82 | HR 75 | Wt 160.0 lb

## 2016-02-19 DIAGNOSIS — M79672 Pain in left foot: Secondary | ICD-10-CM | POA: Diagnosis not present

## 2016-02-19 DIAGNOSIS — M79671 Pain in right foot: Secondary | ICD-10-CM | POA: Diagnosis not present

## 2016-02-19 NOTE — Patient Instructions (Signed)
Take 2 Aleve twice per day and use your Danskin shoes. If no improvement call me

## 2016-02-19 NOTE — Progress Notes (Signed)
   Subjective:    Patient ID: Carol [name], female    DOB: Aug 11, 1950, 65 y.o.   MRN: [id number]  HPI She complains of a 2 month history of constant bilateral foot pain. There is no change with walking or sitting. She also complains of some spasms in her toes especially at night. She has noted no pain in other joints are spasms elsewhere in her body. No history of injury to her feet. She does state that [employer] work the best. She has not been on any medications.   Review of Systems     Objective:   Physical Exam Normal ankle motion. Bilateral tenderness to palpation over the plantar fascia but not necessarily more over the calcaneal spur. Normal foot and forefoot motion. Achilles tendon normal.       Assessment & Plan:  Bilateral foot pain The pain does seem to on clinical exam be more associated with her plantar fascia but she is still having pain even at night when she's off her feet. I do not think this is neuropathic. I will try her initially on 2 Aleve twice per day and use that [place]. If continued difficulty probably refer to podiatry as this does seem to be more biomechanical than neuropathic.

## 2016-03-06 ENCOUNTER — Telehealth: Payer: Self-pay | Admitting: Family Medicine

## 2016-03-06 ENCOUNTER — Other Ambulatory Visit: Payer: Self-pay

## 2016-03-06 DIAGNOSIS — M79672 Pain in left foot: Principal | ICD-10-CM

## 2016-03-06 DIAGNOSIS — M79671 Pain in right foot: Secondary | ICD-10-CM

## 2016-03-06 NOTE — Telephone Encounter (Signed)
Requesting referral to foot doctor. Pt said she is still having foot pain. Aleve is not helping. Dr Redmond School told her that he would refer her to a foot doctor if the foot pain continued after taking Aleve.

## 2016-03-06 NOTE — Telephone Encounter (Signed)
Refer to podiatry

## 2016-03-06 NOTE — Telephone Encounter (Signed)
Order put in epic called pt left message that referral was made

## 2016-03-17 ENCOUNTER — Ambulatory Visit: Payer: BC Managed Care – PPO | Admitting: Podiatry

## 2016-03-24 ENCOUNTER — Other Ambulatory Visit: Payer: Self-pay | Admitting: Nurse Practitioner

## 2016-03-24 DIAGNOSIS — Z1239 Encounter for other screening for malignant neoplasm of breast: Secondary | ICD-10-CM

## 2016-03-24 DIAGNOSIS — Z1231 Encounter for screening mammogram for malignant neoplasm of breast: Secondary | ICD-10-CM

## 2016-04-25 ENCOUNTER — Ambulatory Visit
Admission: RE | Admit: 2016-04-25 | Discharge: 2016-04-25 | Disposition: A | Discharge status: Home / Self Care | Payer: BC Managed Care – PPO | Source: Ambulatory Visit | Attending: Nurse Practitioner | Admitting: Nurse Practitioner

## 2016-04-25 DIAGNOSIS — Z1231 Encounter for screening mammogram for malignant neoplasm of breast: Secondary | ICD-10-CM

## 2016-05-21 ENCOUNTER — Ambulatory Visit (INDEPENDENT_AMBULATORY_CARE_PROVIDER_SITE_OTHER): Payer: BC Managed Care – PPO | Admitting: Nurse Practitioner

## 2016-05-21 ENCOUNTER — Encounter: Payer: Self-pay | Admitting: Nurse Practitioner

## 2016-05-21 VITALS — BP 120/82 | HR 76 | Ht 60.5 in | Wt 162.0 lb

## 2016-05-21 DIAGNOSIS — E2839 Other primary ovarian failure: Secondary | ICD-10-CM | POA: Diagnosis not present

## 2016-05-21 DIAGNOSIS — D126 Benign neoplasm of colon, unspecified: Secondary | ICD-10-CM

## 2016-05-21 DIAGNOSIS — N390 Urinary tract infection, site not specified: Secondary | ICD-10-CM

## 2016-05-21 DIAGNOSIS — Z860100 Personal history of colon polyps, unspecified: Secondary | ICD-10-CM

## 2016-05-21 DIAGNOSIS — I824Z1 Acute embolism and thrombosis of unspecified deep veins of right distal lower extremity: Secondary | ICD-10-CM

## 2016-05-21 DIAGNOSIS — Z01419 Encounter for gynecological examination (general) (routine) without abnormal findings: Secondary | ICD-10-CM | POA: Diagnosis not present

## 2016-05-21 DIAGNOSIS — Z0000 Encounter for general adult medical examination without abnormal findings: Secondary | ICD-10-CM

## 2016-05-21 DIAGNOSIS — Z1211 Encounter for screening for malignant neoplasm of colon: Secondary | ICD-10-CM | POA: Diagnosis not present

## 2016-05-21 HISTORY — DX: Personal history of colon polyps, unspecified: Z86.0100

## 2016-05-21 HISTORY — DX: Personal history of colonic polyps: Z86.010

## 2016-05-21 LAB — CBC
HCT: 43.9 % (ref 35.0–45.0)
Hemoglobin: 14.8 g/dL (ref 11.7–15.5)
MCH: 30 pg (ref 27.0–33.0)
MCHC: 33.7 g/dL (ref 32.0–36.0)
MCV: 88.9 fL (ref 80.0–100.0)
MPV: 9.5 fL (ref 7.5–12.5)
PLATELETS: 285 10*3/uL (ref 140–400)
RBC: 4.94 MIL/uL (ref 3.80–5.10)
RDW: 13.9 % (ref 11.0–15.0)
WBC: 6.9 10*3/uL (ref 3.8–10.8)

## 2016-05-21 LAB — COMPREHENSIVE METABOLIC PANEL
ALT: 56 U/L — AB (ref 6–29)
AST: 49 U/L — ABNORMAL HIGH (ref 10–35)
Albumin: 4.9 g/dL (ref 3.6–5.1)
Alkaline Phosphatase: 80 U/L (ref 33–130)
BUN: 27 mg/dL — AB (ref 7–25)
CHLORIDE: 104 mmol/L (ref 98–110)
CO2: 27 mmol/L (ref 20–31)
CREATININE: 0.86 mg/dL (ref 0.50–0.99)
Calcium: 10 mg/dL (ref 8.6–10.4)
GLUCOSE: 98 mg/dL (ref 65–99)
POTASSIUM: 4.9 mmol/L (ref 3.5–5.3)
SODIUM: 141 mmol/L (ref 135–146)
TOTAL PROTEIN: 7.7 g/dL (ref 6.1–8.1)
Total Bilirubin: 0.4 mg/dL (ref 0.2–1.2)

## 2016-05-21 LAB — LIPID PANEL
CHOL/HDL RATIO: 3.2 ratio (ref <5.0)
CHOLESTEROL: 185 mg/dL (ref <200)
HDL: 58 mg/dL (ref >50)
LDL Cholesterol: 83 mg/dL (ref <100)
Triglycerides: 218 mg/dL — ABNORMAL HIGH (ref <150)
VLDL: 44 mg/dL — AB (ref <30)

## 2016-05-21 LAB — POCT URINALYSIS DIPSTICK
Bilirubin, UA: NEGATIVE
Glucose, UA: NEGATIVE
Ketones, UA: NEGATIVE
NITRITE UA: POSITIVE
PH UA: 7
Protein, UA: NEGATIVE
RBC UA: NEGATIVE
UROBILINOGEN UA: NEGATIVE

## 2016-05-21 LAB — HEMOGLOBIN, FINGERSTICK: HEMOGLOBIN, FINGERSTICK: 14.3 g/dL (ref 12.0–16.0)

## 2016-05-21 LAB — TSH: TSH: 1.7 m[IU]/L

## 2016-05-21 MED ORDER — CIPROFLOXACIN HCL 500 MG PO TABS: 500.0000 mg | ORAL_TABLET | Freq: Two times a day (BID) | ORAL | 0 refills | Status: DC

## 2016-05-21 MED ORDER — FLUCONAZOLE 150 MG PO TABS
150.0000 mg | ORAL_TABLET | Freq: Once | ORAL | 0 refills | Status: AC
Start: 2016-05-21 — End: 2016-05-21

## 2016-05-21 NOTE — Progress Notes (Signed)
Patient ID: Carol [name], female   DOB: 1951-02-07, 65 y.o.   MRN: [id number]  65 y.o. [id number] Married  Caucasian Fe here for annual exam.  Now off Xeralto since 11/2015 per Dr. Redmond School.  Since her right robotic laparoscopic ureteral reimplantation on 12/2014; she has had multiple UTI.  She has been rechecked by Urologist and was placed on ? Trimethoprim but developed a red rash around the face.  Currently with a foul smelling urine and in general not feeling well.  Also having bilateral plantar fascitis and bone spurs.  The right is chronic due to past DVT on the right.  She is going to PT.  Wife Carol [name] lost her mother this year to Alzheimer's.  Patient's last menstrual period was 06/23/1990.          Sexually active: Yes.    The current method of family planning is none.  Female partner.  Exercising: No.  The patient does not participate in regular exercise at present. Smoker:  no  Health Maintenance: Pap: 12/30/01, Negative (hysterectomy) MMG: 04/25/16, Bi-Rads 1:  Negative Colonoscopy: 05/14/12, 4 polyps - 1 - tubular adenoma, repeat 5 years IFOB is given BMD: 03/28/13 T Score: -1.6 Spine / 1.2 Left Femur Neck TDaP: 06/23/08 Shingles: 06/01/15 Pneumonia: Not indicated due to age Hep C and HIV: 05/15/15 Labs: HB: 14.3  Urine: Positive Nitrites, 1+ Leuk's   reports that she has never smoked. She has never used smokeless tobacco. She reports that she drinks alcohol. She reports that she does not use drugs.  Past Medical History:  Diagnosis Date  . Anxiety    claustrophobic  . Arthritis    "neck, hands" (09/02/2013)  . Basal cell carcinoma of forehead 2008; 2014  . Dizziness last occurance 10-15-2014   on occasion, not related to activity  . DVT (deep venous thrombosis) ([facility]) 06/28/2014   Dr Redmond School, right leg, behind knee  . Environmental allergies   . Exercise-induced asthma 30's  . GERD (gastroesophageal reflux disease)   . H/O hiatal hernia   . Herpes simplex 1976  .  History of kidney stones   . Hyperlipidemia   . Migraine teens, returned in 40"s   "depends on the change in the weather; last one was 1 wk ago" (09/02/2013)  . PONV (postoperative nausea and vomiting) 3/ 2015   ponv after gallblaffer  . Right ureteral stone   . Seasonal allergic rhinitis     Past Surgical History:  Procedure Laterality Date  . ABDOMINAL HYSTERECTOMY  1992  . CESAREAN SECTION  1981  . CHOLECYSTECTOMY N/A 09/02/2013   Procedure: LAPAROSCOPIC CHOLECYSTECTOMY;  Surgeon: Harl Bowie, MD;  Location: [facility];  Service: General;  Laterality: N/A;  . CYSTOSCOPY W/ URETERAL STENT PLACEMENT  03/05/2012   Procedure: CYSTOSCOPY WITH RETROGRADE PYELOGRAM/URETERAL STENT PLACEMENT;  Surgeon: Hanley Ben, MD;  Location: [facility];  Service: Urology;  Laterality: Right;  . CYSTOSCOPY W/ URETERAL STENT PLACEMENT Right 01/19/2015   Procedure: CYSTOSCOPY WITH RETROGRADE PYELOGRAM/URETERAL  RIGHT STENT PLACEMENT;  Surgeon: Alexis Frock, MD;  Location: [facility];  Service: Urology;  Laterality: Right;  . CYSTOSCOPY WITH RETROGRADE PYELOGRAM, URETEROSCOPY AND STENT PLACEMENT Right 09/08/2014   Procedure: CYSTOSCOPY WITH RIGHT  RETROGRADE PYELOGRAM, URETEROSCOPY AND RIGHT STENT PLACEMENT;  Surgeon: Raynelle Bring, MD;  Location: [facility];  Service: Urology;  Laterality: Right;  . CYSTOSCOPY WITH RETROGRADE PYELOGRAM, URETEROSCOPY AND STENT PLACEMENT Right 10/19/2014   Procedure: CYSTOSCOPY WITH Bilateral RETROGRADE PYELOGRAM/RIGHT URETEROSCOPY,RIGHT JJ STENT;  Surgeon:  Carolan Clines, MD;  [facility];  Service: Urology;  Laterality: Right;  . CYSTOSCOPY/RETROGRADE/URETEROSCOPY  11/18/2011   Procedure: CYSTOSCOPY/RETROGRADE/URETEROSCOPY;  Surgeon: Ailene Rud, MD;  Location[facility];  Service: Urology;  Laterality: Right;  . CYSTOSCOPY/RETROGRADE/URETEROSCOPY/STONE EXTRACTION WITH BASKET  10/21/2011   Procedure: CYSTOSCOPY/RETROGRADE/URETEROSCOPY/STONE EXTRACTION  WITH BASKET;  Surgeon: Ailene Rud, MD;  Location: [facility];  Service: Urology;  Laterality: Right;  . CYSTOSCOPY/RETROGRADE/URETEROSCOPY/STONE EXTRACTION WITH BASKET Right 09/10/2012   Procedure: RIGHT URETEROSCOPY/STONE EXTRACTION/LASER LITHOTRIPSY/POSSIBLE DOUBLE J STENT PLACEMENT;  Surgeon: Ailene Rud, MD;  [facility];  Service: Urology;  Laterality: Right;  . MOHS SURGERY  2008; 2014  . POSTERIOR PINNACLE MESH SACROSPINOUS REPAIR  11-22-2010   RECURRENT ENTEROCELE  . RIGHT URETEROSCOPIC STONE EXTRACTION W/ STENT PLACEMENT  08-12-2011   x2 stents  . ROBOTICALLY ASSISTED LAPAROSCOPIC URETERAL RE-IMPLANTATION Right 01/19/2015   Procedure: ROBOTICALLY ASSISTED LAPAROSCOPIC URETERAL RE-IMPLANTATION RIGHT;  Surgeon: Alexis Frock, MD;  [facility];  Service: Urology;  Laterality: Right;  . TUBAL LIGATION  1991  . VAULT SUSPENSION (DERMAL GRAFT)/ ENTEROCELE AND RECTOCELE REPAIR  12-05-2005   VAULT PROLAPSE/ ENTERCELE/ RECTOCELE    Current Outpatient Prescriptions  Medication Sig Dispense Refill  . albuterol (PROVENTIL HFA;VENTOLIN HFA) 108 (90 Base) MCG/ACT inhaler Inhale 2 puffs into the lungs every 6 (six) hours as needed for wheezing or shortness of breath. 1 Inhaler 1  . atorvastatin (LIPITOR) 20 MG tablet TAKE 1 TABLET ONCE DAILY. 30 tablet 11  . chlordiazePOXIDE (LIBRIUM) 10 MG capsule Take 10 mg by mouth at bedtime.    Marland Kitchen dexlansoprazole (DEXILANT) 60 MG capsule Take 60 mg by mouth every morning.     . meclizine (ANTIVERT) 25 MG tablet Take 1 tablet (25 mg total) by mouth 3 (three) times daily as needed for dizziness. (Patient not taking: Reported on 10/15/2015) 30 tablet 0  . methocarbamol (ROBAXIN) 500 MG tablet Take 500 mg by mouth as needed for muscle spasms.    . mometasone (ASMANEX 120 METERED DOSES) 220 MCG/INH inhaler INHALE 1 PUFF AS DIRECTED TWICE A DAY 1 Inhaler 11  . rizatriptan (MAXALT) 10 MG tablet Take 10 mg by mouth as needed  for migraine. May repeat in 2 hours if needed    . senna-docusate (SENOKOT-S) 8.6-50 MG per tablet Take 1 tablet by mouth 2 (two) times daily. While taking pain meds to prevent constipation (Patient not taking: Reported on 10/15/2015) 30 tablet 3  . valACYclovir (VALTREX) 500 MG tablet TAKE 1 TABLET DAILY AS DIRECTED. 30 tablet 12  . XARELTO 20 MG TABS tablet TAKE 1 TABLET ONCE DAILY WITH SUPPER. (Patient not taking: Reported on 02/19/2016) 30 tablet 5  . zonisamide (ZONEGRAN) 100 MG capsule Take 200 mg by mouth at bedtime.      No current facility-administered medications for this visit.     Family History  Problem Relation Age of Onset  . Cancer Father     ROS:  Pertinent items are noted in HPI.  Otherwise, a comprehensive ROS was negative.  Exam:   LMP 06/23/1990    Ht Readings from Last 3 Encounters:  10/15/15 5' 0.5" (1.537 m)  09/11/15 5\' 1"  (1.549 m)  05/15/15 5\' 1"  (1.549 m)    General appearance: alert, cooperative and appears stated age Head: Normocephalic, without obvious abnormality, atraumatic Neck: no adenopathy, supple, symmetrical, trachea midline and thyroid normal to inspection and palpation Lungs: clear to auscultation bilaterally Breasts: normal appearance, no masses or tenderness Heart: regular rate  and rhythm Abdomen: soft, non-tender; no masses,  no organomegaly Extremities: extremities normal, atraumatic, no cyanosis or edema Skin: Skin color, texture, turgor normal. No rashes or lesions Lymph nodes: Cervical, supraclavicular, and axillary nodes normal. No abnormal inguinal nodes palpated Neurologic: Grossly normal   Pelvic: External genitalia:  no lesions              Urethra:  normal appearing urethra with no masses, tenderness or lesions              Bartholin's and Skene's: normal                 Vagina: normal appearing vagina with normal color and discharge, no lesions              Cervix: absent              Pap taken: No. Bimanual Exam:   Uterus:  uterus absent              Adnexa: no mass, fullness, tenderness               Rectovaginal: Confirms               Anus:  normal sphincter tone, no lesions  Chaperone present: yes  A:  Well Woman with normal exam  S/P TAH secondary to uterine prolapse 1992             ERT started 12/29/1999 -  06/2014 (DC secondary to DVT) History of HSV, Vit D deficiency  History of migraine headaches  History of renal calculi with multiple cysto, lithotripsy  S/P Vault Suspension with graft and mesh 3/07 & 6/12 S/P Lap Chole 3/15 S/P right Ureteral re-implantation with cysto and right stent 01/19/15             History of DVT 06/2014 - now off Xarelto since 11/2015  History of frequent UTI - current symptoms  History of tubular adenoma   P:   Reviewed health and wellness pertinent to exam  Pap smear as above  Mammogram is due 04/2017  Rx: Cipro 500 mg BID for 10 days  She is encouraged to return to see Urologist  RX: Diflucan 150 mg if needed  Will follow with labs and urine C&S  IFOB is given  Order for BMD at [place] on breast self exam, mammography screening, adequate intake of calcium and vitamin D, diet and exercise, Kegel's exercises return annually or prn  An After Visit Summary was printed and given to the patient.

## 2016-05-21 NOTE — Patient Instructions (Addendum)

## 2016-05-22 LAB — URINALYSIS, MICROSCOPIC ONLY
CASTS: NONE SEEN [LPF]
Yeast: NONE SEEN [HPF]

## 2016-05-22 LAB — VITAMIN D 25 HYDROXY (VIT D DEFICIENCY, FRACTURES): VIT D 25 HYDROXY: 31 ng/mL (ref 30–100)

## 2016-05-22 NOTE — Progress Notes (Signed)
Reviewed personally.  M. Suzanne Yeriel Mineo, MD.  

## 2016-05-23 HISTORY — PX: PLANTAR FASCIA SURGERY: SHX746

## 2016-05-24 LAB — URINE CULTURE

## 2016-05-26 ENCOUNTER — Other Ambulatory Visit: Payer: Self-pay | Admitting: Nurse Practitioner

## 2016-05-26 DIAGNOSIS — N390 Urinary tract infection, site not specified: Secondary | ICD-10-CM

## 2016-05-26 DIAGNOSIS — R899 Unspecified abnormal finding in specimens from other organs, systems and tissues: Secondary | ICD-10-CM

## 2016-06-10 ENCOUNTER — Other Ambulatory Visit: Payer: Self-pay | Admitting: Family Medicine

## 2016-06-10 ENCOUNTER — Other Ambulatory Visit: Payer: BC Managed Care – PPO

## 2016-06-10 LAB — CBC WITH DIFFERENTIAL/PLATELET
BASOS PCT: 1 %
Basophils Absolute: 64 cells/uL (ref 0–200)
Eosinophils Absolute: 128 cells/uL (ref 15–500)
Eosinophils Relative: 2 %
HCT: 42.5 % (ref 35.0–45.0)
Hemoglobin: 13.8 g/dL (ref 11.7–15.5)
LYMPHS PCT: 30 %
Lymphs Abs: 1920 cells/uL (ref 850–3900)
MCH: 28.8 pg (ref 27.0–33.0)
MCHC: 32.5 g/dL (ref 32.0–36.0)
MCV: 88.5 fL (ref 80.0–100.0)
MONOS PCT: 7 %
MPV: 9.9 fL (ref 7.5–12.5)
Monocytes Absolute: 448 cells/uL (ref 200–950)
Neutro Abs: 3840 cells/uL (ref 1500–7800)
Neutrophils Relative %: 60 %
PLATELETS: 287 10*3/uL (ref 140–400)
RBC: 4.8 MIL/uL (ref 3.80–5.10)
RDW: 13.7 % (ref 11.0–15.0)
WBC: 6.4 10*3/uL (ref 4.0–10.5)

## 2016-06-11 LAB — COMPREHENSIVE METABOLIC PANEL
ALK PHOS: 65 U/L (ref 33–130)
ALT: 27 U/L (ref 6–29)
AST: 28 U/L (ref 10–35)
Albumin: 4.4 g/dL (ref 3.6–5.1)
BILIRUBIN TOTAL: 0.4 mg/dL (ref 0.2–1.2)
BUN: 18 mg/dL (ref 7–25)
CO2: 24 mmol/L (ref 20–31)
Calcium: 9.1 mg/dL (ref 8.6–10.4)
Chloride: 107 mmol/L (ref 98–110)
Creat: 0.82 mg/dL (ref 0.50–0.99)
GLUCOSE: 96 mg/dL (ref 65–99)
POTASSIUM: 4.8 mmol/L (ref 3.5–5.3)
Sodium: 142 mmol/L (ref 135–146)
Total Protein: 7.1 g/dL (ref 6.1–8.1)

## 2016-06-11 LAB — URINALYSIS, ROUTINE W REFLEX MICROSCOPIC
BILIRUBIN URINE: NEGATIVE
GLUCOSE, UA: NEGATIVE
Hgb urine dipstick: NEGATIVE
Ketones, ur: NEGATIVE
Leukocytes, UA: NEGATIVE
Nitrite: NEGATIVE
PH: 6.5 (ref 5.0–8.0)
PROTEIN: NEGATIVE
Specific Gravity, Urine: 1.025 (ref 1.001–1.035)

## 2016-06-17 ENCOUNTER — Other Ambulatory Visit: Payer: Self-pay | Admitting: Nurse Practitioner

## 2016-06-18 NOTE — Telephone Encounter (Signed)
Medication refill request: Valtrex  Last AEX:  05-21-16  Next AEX: 05-22-17  Last MMG (if hormonal medication request): 04-29-16 WNL  Refill authorized: please advise

## 2016-06-26 ENCOUNTER — Encounter: Payer: Self-pay | Admitting: *Deleted

## 2016-06-26 NOTE — Progress Notes (Signed)
Letter sent via MyChart requesting return of IFOB kit.  Order extended to 07/27/16.

## 2016-07-04 NOTE — Addendum Note (Signed)
Addended by: Kem Boroughs R on: 07/04/2016 11:41 AM   Modules accepted: Orders

## 2016-10-06 ENCOUNTER — Ambulatory Visit (INDEPENDENT_AMBULATORY_CARE_PROVIDER_SITE_OTHER): Payer: Self-pay

## 2016-10-06 ENCOUNTER — Encounter (INDEPENDENT_AMBULATORY_CARE_PROVIDER_SITE_OTHER): Payer: Self-pay | Admitting: Orthopedic Surgery

## 2016-10-06 ENCOUNTER — Ambulatory Visit (INDEPENDENT_AMBULATORY_CARE_PROVIDER_SITE_OTHER): Payer: BC Managed Care – PPO | Admitting: Orthopedic Surgery

## 2016-10-06 DIAGNOSIS — M25511 Pain in right shoulder: Secondary | ICD-10-CM

## 2016-10-06 MED ORDER — MELOXICAM 15 MG PO TABS: ORAL_TABLET | ORAL | 1 refills | Status: DC

## 2016-10-06 NOTE — Progress Notes (Signed)
Office Visit Note   Patient: Carol [name]           Date of Birth: 1951-06-07           MRN: [id number] Visit Date: 10/06/2016 Requested by: Denita Lung, MD [address] [place], [place] [address] PCP: Wyatt Haste, MD  Subjective: Chief Complaint  Patient presents with  . Right Shoulder - Pain    HPI: Carol [name] is a 66 year old patient with right shoulder pain.  She states the right shoulder been hurting since December.  He was strapped down for an IV she had prior to a foot procedure.  She reports that she's been unable to reach or pull without pain.  Localizes the pain anterior on the right shoulder.  It's hard for her to lift her arm at times.  She denies any popping or grinding.  She does well with computer work.  She has been sleeping in a recliner at times.  Denies any neck symptoms or radicular symptoms.  Tylenol does help.  Patient also reports and not around the A1 pulley location of the right hand.  She denies any triggering.  She states that the knot is tender to touch.  At times the finger will get red.              ROS: All systems reviewed are negative as they relate to the chief complaint within the history of present illness.  Patient denies  fevers or chills.   Assessment & Plan: Visit Diagnoses:  1. Right shoulder pain, unspecified chronicity     Plan: Impression is right shoulder bursitis.  No restriction of passive range of motion.  Rotator cuff strength excellent.  Radiographs normal.  Statistically speaking this is likely bursitis or biceps tendinitis.  Injection will be next step.  We'll try her on motor vehicle first.  One a day for 3 weeks.  In regards to this right hand I think she may have a tendon sheath cyst which may be emanating up through the A1 pulley.  No real evidence of discrete infection.  Epidermal inclusion cyst also a possibility.  She describes that the area has been red at times but I'm not really seem to much of that  going on.  Looked at it with ultrasound today and she does have what appears to be a cyst above the A1 pulley.  Nicki Reaper is well-defined as the typical ganglion cyst we see emanating from the tendon sheath.  Follow-Up Instructions: No Follow-up on file.   Orders:  Orders Placed This Encounter  Procedures  . XR Shoulder Right   Meds ordered this encounter  Medications  . meloxicam (MOBIC) 15 MG tablet    Sig: 1 PO Q D X 3 WEEKS    Dispense:  30 tablet    Refill:  1      Procedures: No procedures performed   Clinical Data: No additional findings.  Objective: Vital Signs: LMP 06/23/1990 (Approximate)   Physical Exam:   Constitutional: Patient appears well-developed HEENT:  Head: Normocephalic Eyes:EOM are normal Neck: Normal range of motion Cardiovascular: Normal rate Pulmonary/chest: Effort normal Neurologic: Patient is alert Skin: Skin is warm Psychiatric: Patient has normal mood and affect    Ortho Exam: Orthopedic exam demonstrates full cervical spine range of motion.  5 out of 5 grip EPL FPL interosseous wrist flexion-extension biceps triceps and deltoid strength.  Radial pulses palpable bilaterally.  No paresthesias C5 T1.  Right shoulder demonstrates full active and passive range of  motion with positive impingement signs positive O'Brien's testing positive speed's testing no restriction of passive motion and excellent rotator cuff strength.  Negative apprehension relocation testing on the right-hand side.  No other masses lymph adenopathy or skin changes noted in the neck region.  Or in the shoulder region.  Specialty Comments:  No specialty comments available.  Imaging: Xr Shoulder Right  Result Date: 10/06/2016 3 views right shoulder reviewed AP outlet and axillary acromioclavicular joint normal.  Visualized lung fields normal.  Joint is reduced.  No glenohumeral arthritis is present.  No other soft tissue calcifications noted.    PMFS History: Patient  Active Problem List   Diagnosis Date Noted  . Tubular adenoma of colon 05/21/2016  . S/P laparoscopic cholecystectomy March 2015 09/03/2013  . Hyperlipidemia with target LDL less than 100 06/27/2011  . Herpes 12/16/2010  . GERD (gastroesophageal reflux disease) 12/16/2010  . HH (hiatus hernia) 12/16/2010  . Hot flashes, menopausal 12/16/2010  . Diverticulosis 12/16/2010  . Asthma 12/16/2010  . 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 Anxiety    claustrophobic  . Arthritis    "neck, hands" (09/02/2013)  . Basal cell carcinoma of forehead 2008; 2014  . Dizziness last occurance 10-15-2014   on occasion, not related to activity  . DVT (deep venous thrombosis) ([place]) 06/28/2014   Dr Redmond School, right leg, behind knee  . Environmental allergies   . Exercise-induced asthma 30's  . GERD (gastroesophageal reflux disease)   . H/O hiatal hernia   . Herpes simplex 1976  . History of kidney stones   . Hyperlipidemia   . Migraine teens, returned in 40"s   "depends on the change in the weather; last one was 1 wk ago" (09/02/2013)  . PONV (postoperative nausea and vomiting) 3/ 2015   ponv after gallblaffer  . Right ureteral stone   . Seasonal allergic rhinitis     Family History  Problem Relation Age of Onset  . Cancer Father     Past Surgical History:  Procedure Laterality Date  . ABDOMINAL HYSTERECTOMY  1992  . CESAREAN SECTION  1981  . CHOLECYSTECTOMY N/A 09/02/2013   Procedure: LAPAROSCOPIC CHOLECYSTECTOMY;  Surgeon: Harl Bowie, MD;  Location: [facility];  Service: General;  Laterality: N/A;  . CYSTOSCOPY W/ URETERAL STENT PLACEMENT  03/05/2012   Procedure: CYSTOSCOPY WITH RETROGRADE PYELOGRAM/URETERAL STENT PLACEMENT;  Surgeon: Hanley Ben, MD;  Location: [facility];  Service: Urology;  Laterality: Right;  . CYSTOSCOPY W/ URETERAL STENT PLACEMENT Right 01/19/2015   Procedure: CYSTOSCOPY WITH RETROGRADE  PYELOGRAM/URETERAL  RIGHT STENT PLACEMENT;  Surgeon: Alexis Frock, MD;  [facility];  Service: Urology;  Laterality: Right;  . CYSTOSCOPY WITH RETROGRADE PYELOGRAM, URETEROSCOPY AND STENT PLACEMENT Right 09/08/2014   Procedure: CYSTOSCOPY WITH RIGHT  RETROGRADE PYELOGRAM, URETEROSCOPY AND RIGHT STENT PLACEMENT;  Surgeon: Raynelle Bring, MD;  [facility];  Service: Urology;  Laterality: Right;  . CYSTOSCOPY WITH RETROGRADE PYELOGRAM, URETEROSCOPY AND STENT PLACEMENT Right 10/19/2014   Procedure: CYSTOSCOPY WITH Bilateral RETROGRADE PYELOGRAM/RIGHT URETEROSCOPY,RIGHT JJ STENT;  Surgeon: Carolan Clines, MD;  [facility];  Service: Urology;  Laterality: Right;  . CYSTOSCOPY/RETROGRADE/URETEROSCOPY  11/18/2011   Procedure: CYSTOSCOPY/RETROGRADE/URETEROSCOPY;  Surgeon: Ailene Rud, MD;  [facility];  Service: Urology;  Laterality: Right;  . CYSTOSCOPY/RETROGRADE/URETEROSCOPY/STONE EXTRACTION WITH BASKET  10/21/2011   Procedure: CYSTOSCOPY/RETROGRADE/URETEROSCOPY/STONE EXTRACTION WITH BASKET;  Surgeon: Ailene Rud, MD;  Location: [facility];  Service: Urology;  Laterality: Right;  . CYSTOSCOPY/RETROGRADE/URETEROSCOPY/STONE EXTRACTION WITH BASKET Right 09/10/2012   Procedure: RIGHT URETEROSCOPY/STONE EXTRACTION/LASER LITHOTRIPSY/POSSIBLE DOUBLE J STENT PLACEMENT;  Surgeon: Ailene Rud, MD;  [facility];  Service: Urology;  Laterality: Right;  . MOHS SURGERY  2008; 2014  . POSTERIOR PINNACLE MESH SACROSPINOUS REPAIR  11-22-2010   RECURRENT ENTEROCELE  . RIGHT URETEROSCOPIC STONE EXTRACTION W/ STENT PLACEMENT  08-12-2011   x2 stents  . ROBOTICALLY ASSISTED LAPAROSCOPIC URETERAL RE-IMPLANTATION Right 01/19/2015   Procedure: ROBOTICALLY ASSISTED LAPAROSCOPIC URETERAL RE-IMPLANTATION RIGHT;  Surgeon: Alexis Frock, MD;  [facility];  Service: Urology;  Laterality: Right;  . TUBAL LIGATION  1991  . VAULT SUSPENSION (DERMAL GRAFT)/  ENTEROCELE AND RECTOCELE REPAIR  12-05-2005   VAULT PROLAPSE/ ENTERCELE/ RECTOCELE   Social History   Occupational History  . Not on file.   Social History Main Topics  . Smoking status: Never Smoker  . Smokeless tobacco: Never Used  . Alcohol use Yes     Comment: rare beer  . Drug use: No  . Sexual activity: Yes

## 2016-10-21 ENCOUNTER — Encounter: Payer: Self-pay | Admitting: Family Medicine

## 2016-10-21 ENCOUNTER — Ambulatory Visit (INDEPENDENT_AMBULATORY_CARE_PROVIDER_SITE_OTHER): Payer: BC Managed Care – PPO | Admitting: Family Medicine

## 2016-10-21 VITALS — BP 120/80 | HR 75 | Temp 97.7°F | Resp 16 | Wt 164.2 lb

## 2016-10-21 DIAGNOSIS — R0609 Other forms of dyspnea: Secondary | ICD-10-CM | POA: Diagnosis not present

## 2016-10-21 DIAGNOSIS — J45901 Unspecified asthma with (acute) exacerbation: Secondary | ICD-10-CM

## 2016-10-21 DIAGNOSIS — R079 Chest pain, unspecified: Secondary | ICD-10-CM | POA: Diagnosis not present

## 2016-10-21 MED ORDER — ALBUTEROL SULFATE (2.5 MG/3ML) 0.083% IN NEBU
2.5000 mg | INHALATION_SOLUTION | Freq: Once | RESPIRATORY_TRACT | Status: AC
  Administered 2016-10-21: 2.5 mg via RESPIRATORY_TRACT

## 2016-10-21 NOTE — Progress Notes (Signed)
Subjective:    Patient ID: Carol [name], female    DOB: [birth year], 66 y.o.   MRN: [id number]  HPI Chief Complaint  Patient presents with  . asthma    asthma, yesterday afternoon, feels like elephant is sitting on chest, Shortness of  breath worsens with movement or lying   She is here with complaints of chest pain that she describes as a heaviness since yesterday afternoon and shortness of breath. This has been constant and worse with sitting or laying down. States she thinks she is having an asthma attack but states this feels differently than her asthma flares in the past. Last asthma attack was last summer and generally her asthma is well controlled. States she has been using asmanex inhaler but denies using albuterol. She is not sure why she has not tried it to see if it improves her symptoms.  She is also having rhinorrhea, nasal congestion and states pollen has caused her allergies to flare up.  Reports having some nausea yesterday but not today.     She reports a history of DVT in 2016 that was related to hormone therapy per patient.  She had foot surgery in late December and was immobilized for 6 weeks but has been mobile for the past 10-11 weeks.   Denies fever, chills, dizziness, fatigue, cough, wheezing, palpitations, abdominal pain, back pain, nausea, vomiting, diarrhea, LE edema, calf or leg pain.   Reviewed allergies, medications, past medical, surgical, family, and social history.   Review of Systems Pertinent positives and negatives in the history of present illness.     Objective:   Physical Exam  Constitutional: She is oriented to person, place, and time. She appears well-developed and well-nourished. No distress.  HENT:  Mouth/Throat: Oropharynx is clear and moist.  Eyes: Conjunctivae are normal. Pupils are equal, round, and reactive to light.  Neck: Normal range of motion. Neck supple. No JVD present.  Cardiovascular: Normal rate, regular rhythm,  normal heart sounds and intact distal pulses.  Exam reveals no gallop and no friction rub.   No murmur heard. No LE edema.   Pulmonary/Chest: Effort normal and breath sounds normal. No accessory muscle usage. No respiratory distress.  Musculoskeletal:       Right lower leg: Normal.       Left lower leg: Normal.  No calf tenderness or swelling. Negative Homans  Lymphadenopathy:    She has no cervical adenopathy.       Right: No supraclavicular adenopathy present.       Left: No supraclavicular adenopathy present.  Neurological: She is alert and oriented to person, place, and time. She has normal strength. No cranial nerve deficit or sensory deficit. Gait normal.  Skin: Skin is warm and dry. No cyanosis. No pallor.  Psychiatric: She has a normal mood and affect. Her speech is normal and behavior is normal. Thought content normal.   BP 120/80   Pulse 75   Temp 97.7 F (36.5 C) (Oral)   Resp 16   Wt 164 lb 3.2 oz (74.5 kg)   LMP 06/23/1990 (Approximate)   SpO2 99%   BMI 31.54 kg/m       Assessment & Plan:  Mild asthma with exacerbation, unspecified whether persistent - Plan: albuterol (PROVENTIL) (2.5 MG/3ML) 0.083% nebulizer solution 2.5 mg  Chest pain, unspecified type - Plan: EKG 12-Lead  Dyspnea on exertion - Plan: EKG 12-Lead  Discussed that her symptoms appear to be related to an acute asthma flare. She is feeling  at least 50% improved with one breathing treatment. Offered to give her a second one but she declines and states she is feeling much better but the albuterol makes her "jittery".  ECG is unremarkable.  She will continue treating her allergies and asthma and states she will use her albuterol inhaler if needed.  No sign of infection. Vitals are wnl.  She is aware that if her symptoms return or any new symptoms she will call or go to the ED if after hours.

## 2016-10-27 ENCOUNTER — Other Ambulatory Visit: Payer: Self-pay | Admitting: Family Medicine

## 2017-01-14 ENCOUNTER — Telehealth: Payer: Self-pay | Admitting: Obstetrics and Gynecology

## 2017-01-14 NOTE — Telephone Encounter (Signed)
Left message for pt to reschedule Carol [name] appt.

## 2017-03-16 ENCOUNTER — Ambulatory Visit (INDEPENDENT_AMBULATORY_CARE_PROVIDER_SITE_OTHER): Payer: BC Managed Care – PPO | Admitting: Family Medicine

## 2017-03-16 ENCOUNTER — Encounter: Payer: Self-pay | Admitting: Family Medicine

## 2017-03-16 ENCOUNTER — Other Ambulatory Visit: Payer: Self-pay | Admitting: Family Medicine

## 2017-03-16 VITALS — BP 120/72 | HR 68 | Temp 98.2°F | Resp 16 | Wt 155.8 lb

## 2017-03-16 DIAGNOSIS — R519 Headache, unspecified: Secondary | ICD-10-CM

## 2017-03-16 DIAGNOSIS — Z1231 Encounter for screening mammogram for malignant neoplasm of breast: Secondary | ICD-10-CM

## 2017-03-16 DIAGNOSIS — G454 Transient global amnesia: Secondary | ICD-10-CM

## 2017-03-16 DIAGNOSIS — R51 Headache: Secondary | ICD-10-CM

## 2017-03-16 DIAGNOSIS — R413 Other amnesia: Secondary | ICD-10-CM | POA: Diagnosis not present

## 2017-03-16 NOTE — Progress Notes (Signed)
Subjective:    Patient ID: Carol [name], female    DOB: March 25, 1951, 66 y.o.   MRN: [id number]  HPI Chief Complaint  Patient presents with  . confusion    yesterday had an epsiode of forgetting how to pay bills, 3 hours later it came back to her. she is scared cause shes not sure why that happen to her   She is here with complaints of a 3 hour episode of memory loss that occurred yesterday. States she felt disoriented yesterday afternoon when she sat down to pay her bills.  States she picked up a bill and could not figure out what to do with it. States she could not remember her log in or passwords.  States she then felt scared and anxious. States she did not realize how much time had passed.   States she has been under increased stress at work. Denies history of similar symptoms.     States her symptoms resolved spontaneously after approximately 3 hours but she continues to feel slightly "foggy". She denies having a headache yesterday. States she has had a mild headache today that does not feel like her usual migraines.   States she had a near syncopal episode last week after getting out of the shower and it resolved after laying down.   No family history of dementia. Mother passed away from an brain aneurysm at age 4.   Denies fever, chills, dizziness, tinnitus, vision changes, chest pain, palpitations, shortness of breath, abdominal pain, N/V/D, urinary symptoms, LE edema.   Reviewed allergies, medications, past medical, surgical, family, and social history.   Review of Systems Pertinent positives and negatives in the history of present illness.     Objective:   Physical Exam  Constitutional: She is oriented to person, place, and time. She appears well-developed and well-nourished. No distress.  HENT:  Right Ear: Hearing, tympanic membrane and ear canal normal.  Left Ear: Hearing, tympanic membrane and ear canal normal.  Nose: Nose normal.  Mouth/Throat: Uvula is  midline, oropharynx is clear and moist and mucous membranes are normal.  Eyes: Pupils are equal, round, and reactive to light. Conjunctivae, EOM and lids are normal.  Neck: Normal range of motion. Neck supple. No thyromegaly present.  Cardiovascular: Normal rate, regular rhythm, normal heart sounds and normal pulses.   No LE edema  Pulmonary/Chest: Effort normal and breath sounds normal.  Musculoskeletal: Normal range of motion.  Lymphadenopathy:    She has no cervical adenopathy.  Neurological: She is alert and oriented to person, place, and time. She has normal strength and normal reflexes. She is not disoriented. She displays no tremor. No cranial nerve deficit or sensory deficit. She displays a negative Romberg sign. Coordination and gait normal. GCS eye subscore is 4. GCS verbal subscore is 5. GCS motor subscore is 6.  No pronator drift.  Normal finger to nose.   Skin: Skin is warm and dry. No rash noted. No pallor.  Psychiatric: She has a normal mood and affect. Her speech is normal and behavior is normal. Thought content normal. Cognition and memory are normal.   BP 120/72   Pulse 68   Temp 98.2 F (36.8 C) (Oral)   Resp 16   Wt 155 lb 12.8 oz (70.7 kg)   LMP 06/23/1990 (Approximate)   SpO2 98%   BMI 29.93 kg/m      Assessment & Plan:  Transient global amnesia - Plan: CBC with Differential/Platelet, Comprehensive metabolic panel, TSH, Vitamin B12, MR Brain  Wo Contrast, CANCELED: MR Brain W Wo Contrast  Memory loss - Plan: CBC with Differential/Platelet, Comprehensive metabolic panel, TSH, Vitamin B12, MR Brain Wo Contrast, CANCELED: MR Brain W Wo Contrast  Acute nonintractable headache, unspecified headache type  Normal neurological exam. MMSE 29/29.  She appears to have had a transient memory loss. Plan to check labs and send her for an MRI to look for underlying etiology.  Discussed patient with her PCP Dr. Redmond School.  We will consider referral to neurology for further  work up.  Patient understands that if she experiences further memory loss or any neurological symptoms that she should go to the ED or call 911.  Follow up pending results.

## 2017-03-16 NOTE — Patient Instructions (Signed)
Transient Global Amnesia Transient global amnesia causes a sudden and temporary (transient) loss of memory (amnesia). While you may recall memories from your distant past, including being able to recognize people you know well, you may not recall things that happened more recently in the past days, months, or even year. A transient global amnesia episode does not last longer than 24 hours. Transient global amnesia does not affect your other brain functions. Your memory usually returns to normal after an episode is over. One episode of transient global amnesia does not make you more likely to have a stroke, a relapse, or other complications. What are the causes? The cause of this condition is not known. What increases the risk? Transient global amnesia is more likely to develop in people who:  Are 50-70 years old.  Have a history of migraine headaches.  What are the signs or symptoms? The main symptoms of this condition include:  The inability to remember recent events.  Asking repetitive questions about the situation and surroundings and not recalling the answers to these questions.  Other symptoms include:  Restlessness and nervousness.  Confusion.  Headaches.  Dizziness.  Nausea.  How is this diagnosed? Your health care provider may suspect transient global amnesia based on your symptoms. Your health care provider will do a physical exam. This may include a test to check your mental abilities (cognitive evaluation). You may also have imaging studies done to check your brain function. These may include:  Electroencephalography (EEG).  Diffusion-weighted imaging (DWI).  MRI.  How is this treated? There is no treatment for this condition. An episode typically goes away on its own after a few hours. If you also have a seizure or migraine during an episode, you will receive treatment for these conditions. This may include medicines. Follow these instructions at home:  Take  medicines only as directed by your health care provider.  Tell your family or friends that you have transient global amnesia. Ask them to help you avoid physical exertion, including sexual intercourse, swimming, and straining while holding your breath (Valsalva maneuver), until the episode passes. These are events that can bring on transient global amnesia attacks. Contact a health care provider if:  You have a migraine and it does not go away after you have followed your treatment plan for this condition.  You have a seizure for the first time, or a seizure that is different from seizures you normally have.  You experience transient global amnesia repeatedly. This information is not intended to replace advice given to you by your health care provider. Make sure you discuss any questions you have with your health care provider. Document Released: 07/17/2004 Document Revised: 02/11/2016 Document Reviewed: 02/22/2014 Elsevier Interactive Patient Education  2018 Elsevier Inc.  

## 2017-03-17 LAB — CBC WITH DIFFERENTIAL/PLATELET
Basophils Absolute: 60 cells/uL (ref 0–200)
Basophils Relative: 0.8 %
EOS PCT: 2 %
Eosinophils Absolute: 150 cells/uL (ref 15–500)
HEMATOCRIT: 44 % (ref 35.0–45.0)
Hemoglobin: 14.7 g/dL (ref 11.7–15.5)
LYMPHS ABS: 2213 {cells}/uL (ref 850–3900)
MCH: 28.4 pg (ref 27.0–33.0)
MCHC: 33.4 g/dL (ref 32.0–36.0)
MCV: 84.9 fL (ref 80.0–100.0)
MONOS PCT: 6.1 %
MPV: 10.8 fL (ref 7.5–12.5)
NEUTROS PCT: 61.6 %
Neutro Abs: 4620 cells/uL (ref 1500–7800)
PLATELETS: 261 10*3/uL (ref 140–400)
RBC: 5.18 10*6/uL — AB (ref 3.80–5.10)
RDW: 13 % (ref 11.0–15.0)
TOTAL LYMPHOCYTE: 29.5 %
WBC mixed population: 458 cells/uL (ref 200–950)
WBC: 7.5 10*3/uL (ref 3.8–10.8)

## 2017-03-17 LAB — COMPREHENSIVE METABOLIC PANEL
AG Ratio: 1.9 (calc) (ref 1.0–2.5)
ALT: 19 U/L (ref 6–29)
AST: 18 U/L (ref 10–35)
Albumin: 4.8 g/dL (ref 3.6–5.1)
Alkaline phosphatase (APISO): 76 U/L (ref 33–130)
BUN: 20 mg/dL (ref 7–25)
CO2: 24 mmol/L (ref 20–32)
CREATININE: 0.76 mg/dL (ref 0.50–0.99)
Calcium: 9.5 mg/dL (ref 8.6–10.4)
Chloride: 108 mmol/L (ref 98–110)
GLUCOSE: 92 mg/dL (ref 65–99)
Globulin: 2.5 g/dL (calc) (ref 1.9–3.7)
Potassium: 3.8 mmol/L (ref 3.5–5.3)
Sodium: 142 mmol/L (ref 135–146)
Total Bilirubin: 0.4 mg/dL (ref 0.2–1.2)
Total Protein: 7.3 g/dL (ref 6.1–8.1)

## 2017-03-17 LAB — VITAMIN B12: VITAMIN B 12: 378 pg/mL (ref 200–1100)

## 2017-03-17 LAB — TSH: TSH: 1.89 m[IU]/L (ref 0.40–4.50)

## 2017-03-20 ENCOUNTER — Telehealth: Payer: Self-pay | Admitting: Family Medicine

## 2017-03-20 NOTE — Telephone Encounter (Signed)
Pt called to let Vickie know that the 03/28/17 visit was the earliest appt date that [facility] could get her in. That date was not her choice.

## 2017-03-20 NOTE — Telephone Encounter (Signed)
This is fine. Thanks

## 2017-03-23 ENCOUNTER — Other Ambulatory Visit: Payer: Self-pay | Admitting: Family Medicine

## 2017-03-23 ENCOUNTER — Ambulatory Visit
Admission: RE | Admit: 2017-03-23 | Discharge: 2017-03-23 | Disposition: A | Discharge status: Home / Self Care | Payer: BC Managed Care – PPO | Source: Ambulatory Visit | Attending: Family Medicine | Admitting: Family Medicine

## 2017-03-23 DIAGNOSIS — G454 Transient global amnesia: Secondary | ICD-10-CM

## 2017-03-23 DIAGNOSIS — R413 Other amnesia: Secondary | ICD-10-CM

## 2017-03-25 ENCOUNTER — Encounter: Payer: Self-pay | Admitting: Neurology

## 2017-03-28 ENCOUNTER — Inpatient Hospital Stay: Admission: RE | Admit: 2017-03-28 | Payer: Self-pay | Source: Ambulatory Visit

## 2017-03-31 ENCOUNTER — Other Ambulatory Visit: Payer: Self-pay | Admitting: Family Medicine

## 2017-03-31 DIAGNOSIS — J4530 Mild persistent asthma, uncomplicated: Secondary | ICD-10-CM

## 2017-04-27 ENCOUNTER — Ambulatory Visit
Admission: RE | Admit: 2017-04-27 | Discharge: 2017-04-27 | Disposition: A | Discharge status: Home / Self Care | Payer: BC Managed Care – PPO | Source: Ambulatory Visit | Attending: Family Medicine | Admitting: Family Medicine

## 2017-04-27 DIAGNOSIS — Z1231 Encounter for screening mammogram for malignant neoplasm of breast: Secondary | ICD-10-CM

## 2017-05-04 ENCOUNTER — Other Ambulatory Visit: Payer: Self-pay | Admitting: Family Medicine

## 2017-05-22 ENCOUNTER — Ambulatory Visit: Payer: BC Managed Care – PPO | Admitting: Nurse Practitioner

## 2017-05-22 DIAGNOSIS — D126 Benign neoplasm of colon, unspecified: Secondary | ICD-10-CM

## 2017-05-22 HISTORY — DX: Benign neoplasm of colon, unspecified: D12.6

## 2017-05-22 LAB — HM COLONOSCOPY

## 2017-05-26 ENCOUNTER — Encounter: Payer: Self-pay | Admitting: Family Medicine

## 2017-05-27 ENCOUNTER — Ambulatory Visit (INDEPENDENT_AMBULATORY_CARE_PROVIDER_SITE_OTHER): Payer: BC Managed Care – PPO | Admitting: Certified Nurse Midwife

## 2017-05-27 ENCOUNTER — Other Ambulatory Visit: Payer: Self-pay

## 2017-05-27 ENCOUNTER — Encounter: Payer: Self-pay | Admitting: Certified Nurse Midwife

## 2017-05-27 VITALS — BP 116/82 | HR 64 | Resp 16 | Ht 60.25 in | Wt 152.0 lb

## 2017-05-27 DIAGNOSIS — Z0000 Encounter for general adult medical examination without abnormal findings: Secondary | ICD-10-CM

## 2017-05-27 DIAGNOSIS — R899 Unspecified abnormal finding in specimens from other organs, systems and tissues: Secondary | ICD-10-CM | POA: Diagnosis not present

## 2017-05-27 DIAGNOSIS — Z780 Asymptomatic menopausal state: Secondary | ICD-10-CM | POA: Diagnosis not present

## 2017-05-27 DIAGNOSIS — M25442 Effusion, left hand: Secondary | ICD-10-CM | POA: Diagnosis not present

## 2017-05-27 DIAGNOSIS — E559 Vitamin D deficiency, unspecified: Secondary | ICD-10-CM | POA: Diagnosis not present

## 2017-05-27 DIAGNOSIS — Z01419 Encounter for gynecological examination (general) (routine) without abnormal findings: Secondary | ICD-10-CM | POA: Diagnosis not present

## 2017-05-27 NOTE — Patient Instructions (Signed)

## 2017-05-27 NOTE — Progress Notes (Signed)
66 y.o. [id number] Married  Caucasian Fe here for annual exam. Menopausal no HRT, denies vaginal bleeding or vaginal dryness. Sees Dr. Redmond School for aex and labs. No HSV 2 outbreaks. Sees [facility] manages her migraine headaches with Maxalt and Medicarbnol. Sees PCP yearly for labs and medication management for cholesterol as needed. Patient complaining of sore hand joints and disfigurement of index finger on right hand. Has occurred in the last week. Plans to call PCP for appointment. Family history of RA with sister. No other health issues today. Screening labs if needed.  Patient's last menstrual period was 06/23/1990 (approximate).          Sexually active: Yes.    The current method of family planning is status post hysterectomy.    Exercising: Yes.    walking Smoker:  no  Health Maintenance: Pap:  12-30-01 neg History of Abnormal Pap: yes MMG:  04-27-17 category b density birads 1:neg Self Breast exams: yes Colonoscopy: 2018 f/u 33yrs BMD:   2014 normal TDaP:  2010 Shingles: 2016 Pneumonia: had done, maybe 13yrs ago Hep C and HIV: both neg 2016 Labs: if needed   reports that  has never smoked. she has never used smokeless tobacco. She reports that she drinks alcohol. She reports that she does not use drugs.  Past Medical History:  Diagnosis Date  . Anxiety    claustrophobic  . Arthritis    "neck, hands" (09/02/2013)  . Basal cell carcinoma of forehead 2008; 2014  . Dizziness last occurance 10-15-2014   on occasion, not related to activity  . DVT (deep venous thrombosis) ([place]) 06/28/2014   Dr Redmond School, right leg, behind knee  . Environmental allergies   . Exercise-induced asthma 30's  . GERD (gastroesophageal reflux disease)   . H/O hiatal hernia   . Herpes simplex 1976  . History of kidney stones   . Hyperlipidemia   . Migraine teens, returned in 40"s   "depends on the change in the weather; last one was 1 wk ago" (09/02/2013)  . Plantar fasciitis   . PONV  (postoperative nausea and vomiting) 3/ 2015   ponv after gallblaffer  . Right ureteral stone   . Seasonal allergic rhinitis     Past Surgical History:  Procedure Laterality Date  . ABDOMINAL HYSTERECTOMY  1992  . CESAREAN SECTION  1981  . CHOLECYSTECTOMY N/A 09/02/2013   Procedure: LAPAROSCOPIC CHOLECYSTECTOMY;  Surgeon: Harl Bowie, MD;  Location: [facility];  Service: General;  Laterality: N/A;  . CYSTOSCOPY W/ URETERAL STENT PLACEMENT  03/05/2012   Procedure: CYSTOSCOPY WITH RETROGRADE PYELOGRAM/URETERAL STENT PLACEMENT;  Surgeon: Hanley Ben, MD;  Location: [facility];  Service: Urology;  Laterality: Right;  . CYSTOSCOPY W/ URETERAL STENT PLACEMENT Right 01/19/2015   Procedure: CYSTOSCOPY WITH RETROGRADE PYELOGRAM/URETERAL  RIGHT STENT PLACEMENT;  Surgeon: Alexis Frock, MD;  Location: [facility];  Service: Urology;  Laterality: Right;  . CYSTOSCOPY WITH RETROGRADE PYELOGRAM, URETEROSCOPY AND STENT PLACEMENT Right 09/08/2014   Procedure: CYSTOSCOPY WITH RIGHT  RETROGRADE PYELOGRAM, URETEROSCOPY AND RIGHT STENT PLACEMENT;  Surgeon: Raynelle Bring, MD;  Location: [facility];  Service: Urology;  Laterality: Right;  . CYSTOSCOPY WITH RETROGRADE PYELOGRAM, URETEROSCOPY AND STENT PLACEMENT Right 10/19/2014   Procedure: CYSTOSCOPY WITH Bilateral RETROGRADE PYELOGRAM/RIGHT URETEROSCOPY,RIGHT JJ STENT;  Surgeon: Carolan Clines, MD;  Location: [facility];  Service: Urology;  Laterality: Right;  . CYSTOSCOPY/RETROGRADE/URETEROSCOPY  11/18/2011   Procedure: CYSTOSCOPY/RETROGRADE/URETEROSCOPY;  Surgeon: Ailene Rud, MD;  Location: [facility];  Service: Urology;  Laterality:  Right;  Marland Kitchen CYSTOSCOPY/RETROGRADE/URETEROSCOPY/STONE EXTRACTION WITH BASKET  10/21/2011   Procedure: CYSTOSCOPY/RETROGRADE/URETEROSCOPY/STONE EXTRACTION WITH BASKET;  Surgeon: Ailene Rud, MD;  Location[facility];  Service: Urology;  Laterality: Right;  . CYSTOSCOPY/RETROGRADE/URETEROSCOPY/STONE  EXTRACTION WITH BASKET Right 09/10/2012   Procedure: RIGHT URETEROSCOPY/STONE EXTRACTION/LASER LITHOTRIPSY/POSSIBLE DOUBLE J STENT PLACEMENT;  Surgeon: Ailene Rud, MD;  Location: [facility];  Service: Urology;  Laterality: Right;  . MOHS SURGERY  2008; 2014  . PLANTAR FASCIA SURGERY  05/2016  . POSTERIOR PINNACLE MESH SACROSPINOUS REPAIR  11-22-2010   RECURRENT ENTEROCELE  . RIGHT URETEROSCOPIC STONE EXTRACTION W/ STENT PLACEMENT  08-12-2011   x2 stents  . ROBOTICALLY ASSISTED LAPAROSCOPIC URETERAL RE-IMPLANTATION Right 01/19/2015   Procedure: ROBOTICALLY ASSISTED LAPAROSCOPIC URETERAL RE-IMPLANTATION RIGHT;  Surgeon: Alexis Frock, MD;  Location: [facility];  Service: Urology;  Laterality: Right;  . TUBAL LIGATION  1991  . VAULT SUSPENSION (DERMAL GRAFT)/ ENTEROCELE AND RECTOCELE REPAIR  12-05-2005   VAULT PROLAPSE/ ENTERCELE/ RECTOCELE    Current Outpatient Medications  Medication Sig Dispense Refill  . albuterol (PROVENTIL HFA;VENTOLIN HFA) 108 (90 Base) MCG/ACT inhaler Inhale 2 puffs into the lungs every 6 (six) hours as needed for wheezing or shortness of breath. 1 Inhaler 1  . ASMANEX 60 METERED DOSES 220 MCG/INH inhaler USE 1 PUFF TWICE A DAY AS DIRECTED 1 Inhaler 0  . atorvastatin (LIPITOR) 20 MG tablet TAKE 1 TABLET ONCE DAILY. 30 tablet 0  . chlordiazePOXIDE (LIBRIUM) 10 MG capsule Take 10 mg by mouth at bedtime.    Marland Kitchen dexlansoprazole (DEXILANT) 60 MG capsule Take 60 mg by mouth every morning.     . rizatriptan (MAXALT) 10 MG tablet Take 10 mg by mouth as needed for migraine. May repeat in 2 hours if needed    . valACYclovir (VALTREX) 500 MG tablet TAKE 1 TABLET DAILY AS DIRECTED. 90 tablet 4  . zonisamide (ZONEGRAN) 100 MG capsule Take 200 mg by mouth at bedtime.      No current facility-administered medications for this visit.     Family History  Problem Relation Age of Onset  . Cancer Father     ROS:  Pertinent items are noted in HPI.  Otherwise, a comprehensive ROS was  negative.  Exam:   BP 116/82   Pulse 64   Resp 16   Ht 5' 0.25" (1.53 m)   Wt 152 lb (68.9 kg)   LMP 06/23/1990 (Approximate)   BMI 29.44 kg/m  Height: 5' 0.25" (153 cm) Ht Readings from Last 3 Encounters:  05/27/17 5' 0.25" (1.53 m)  05/21/16 5' 0.5" (1.537 m)  10/15/15 5' 0.5" (1.537 m)    General appearance: alert, cooperative and appears stated age Head: Normocephalic, without obvious abnormality, atraumatic Neck: no adenopathy, supple, symmetrical, trachea midline and thyroid normal to inspection and palpation Lungs: clear to auscultation bilaterally Breasts: normal appearance, no masses or tenderness, No nipple retraction or dimpling, No nipple discharge or bleeding, No axillary or supraclavicular adenopathy Heart: regular rate and rhythm Abdomen: soft, non-tender; no masses,  no organomegaly Extremities: extremities normal, atraumatic, no cyanosis or edema, left hand second digit note curvature of last upper joint, all joints bilateral appear swollen, no redness Skin: Skin color, texture, turgor normal. No rashes or lesions Lymph nodes: Cervical, supraclavicular, and axillary nodes normal. No abnormal inguinal nodes palpated Neurologic: Grossly normal   Pelvic: External genitalia:  no lesions              Urethra:  normal appearing urethra with  no masses, tenderness or lesions              Bartholin's and Skene's: normal                 Vagina: normal appearing vagina with normal color and discharge, no lesions              Cervix: absent              Pap taken: No. Bimanual Exam:  Uterus:  uterus absent              Adnexa: no mass, fullness, tenderness               Rectovaginal: Confirms               Anus:  normal sphincter tone, no lesions  Chaperone present: yes  A:  Well Woman with normal exam  Menopausal no HRT S/P TAH for bleeding and prolapse per patient  Joint edema bilateral of hands, family history of RA  Cholesterol and asthma, Migraine management   with MD  BMD due, PCP manages  Screening labs History of Vitamin D deficiency    P:   Reviewed health and wellness pertinent to exam  Discussed importance of regular exercise for overall health.  Stressed importance of evaluation of hands with PCP or orthopedic,offered referral , declined  Continue follow up with MD as indicated and discuss BMD due  Lab: Vitamin D, Lipid panel  Pap smear: no   counseled on breast self exam, mammography screening, feminine hygiene, adequate intake of calcium and vitamin D, diet and exercise  return annually or prn  An After Visit Summary was printed and given to the patient.

## 2017-05-28 LAB — LIPID PANEL
CHOL/HDL RATIO: 3 ratio (ref 0.0–4.4)
CHOLESTEROL TOTAL: 167 mg/dL (ref 100–199)
HDL: 55 mg/dL (ref >39)
LDL CALC: 82 mg/dL (ref 0–99)
Triglycerides: 150 mg/dL — ABNORMAL HIGH (ref 0–149)
VLDL Cholesterol Cal: 30 mg/dL (ref 5–40)

## 2017-05-28 LAB — VITAMIN D 25 HYDROXY (VIT D DEFICIENCY, FRACTURES): Vit D, 25-Hydroxy: 39.4 ng/mL (ref 30.0–100.0)

## 2017-05-29 ENCOUNTER — Encounter: Payer: Self-pay | Admitting: Family Medicine

## 2017-06-01 ENCOUNTER — Other Ambulatory Visit: Payer: Self-pay | Admitting: Family Medicine

## 2017-06-02 NOTE — Telephone Encounter (Signed)
After looking in chart looks patient had lipids done by GYN on 05/27/17. Do you want to refill her lipitor based on these results or should I advise her to have GYN refill? Please advise.

## 2017-06-17 ENCOUNTER — Encounter: Payer: Self-pay | Admitting: Family Medicine

## 2017-06-17 ENCOUNTER — Ambulatory Visit (INDEPENDENT_AMBULATORY_CARE_PROVIDER_SITE_OTHER): Payer: BC Managed Care – PPO | Admitting: Family Medicine

## 2017-06-17 ENCOUNTER — Ambulatory Visit
Admission: RE | Admit: 2017-06-17 | Discharge: 2017-06-17 | Disposition: A | Discharge status: Home / Self Care | Payer: BC Managed Care – PPO | Source: Ambulatory Visit | Attending: Family Medicine | Admitting: Family Medicine

## 2017-06-17 VITALS — BP 122/84 | HR 75 | Ht 60.5 in | Wt 155.0 lb

## 2017-06-17 DIAGNOSIS — Z23 Encounter for immunization: Secondary | ICD-10-CM | POA: Diagnosis not present

## 2017-06-17 DIAGNOSIS — M1990 Unspecified osteoarthritis, unspecified site: Secondary | ICD-10-CM | POA: Diagnosis not present

## 2017-06-17 NOTE — Progress Notes (Signed)
   Subjective:    Patient ID: Carol [name], female    DOB: 1950-09-13, 66 y.o.   MRN: [id number]  HPI She is here for evaluation of a several week to month long history of swelling and pain in the DIP joints of all of her fingers.  She notes no difficulty with wrist, elbow, knee, feet.  No skin changes or hair changes, weight loss.  She continues on the medications listed in the chart.  She has no history of psoriasis.  Apparently 1 of her siblings was diagnosed with rheumatoid arthritis.  Review of Systems     Objective:   Physical Exam Alert and in no distress.  Exam of the DIP joints of all fingers does show some slight joint swelling as well as very slight erythema.  Exam of the PIP joints, wrists, elbows, knees and ankles shows no changes.       Assessment & Plan:  Need for shingles vaccine - Plan: Varicella-zoster vaccine IM (Shingrix)  Arthritis - Plan: Rheumatoid Arthritis Diagnostic Panel, Comprehensive, DG HANDS 1 VIEW BILAT BALLCATCHERS  Follow-up pending blood work results.

## 2017-06-21 LAB — RHEUMATOID ARTHRITIS DIAGNOSTIC PANEL, COMPREHENSIVE
Cyclic Citrullin Peptide Ab: <16 Units (ref <20)
Rheumatoid Factor (IgA): <5 U (ref <=6)
SSB (La) (ENA) Antibody, IgG: <1 AI (ref <1.0)

## 2017-06-25 ENCOUNTER — Other Ambulatory Visit: Payer: Self-pay

## 2017-06-25 DIAGNOSIS — M1990 Unspecified osteoarthritis, unspecified site: Secondary | ICD-10-CM

## 2017-06-29 ENCOUNTER — Other Ambulatory Visit: Payer: Self-pay | Admitting: *Deleted

## 2017-06-29 ENCOUNTER — Other Ambulatory Visit: Payer: Self-pay | Admitting: Obstetrics & Gynecology

## 2017-06-29 MED ORDER — VALACYCLOVIR HCL 500 MG PO TABS: 500.0000 mg | ORAL_TABLET | Freq: Every day | ORAL | 4 refills | Status: DC

## 2017-06-29 NOTE — Telephone Encounter (Signed)
Medication refill request: valtrex  Last AEX:  05/27/17 DL  Next AEX: 06/04/18  Last MMG (if hormonal medication request): 04/27/17 BIRADS 1 negative  Refill authorized: 06/12/16 #[id number], 4RF. Today, please advise.   Routing to covering provider.

## 2017-07-06 ENCOUNTER — Ambulatory Visit: Payer: Self-pay | Admitting: Neurology

## 2017-07-09 ENCOUNTER — Other Ambulatory Visit: Payer: Self-pay | Admitting: Nurse Practitioner

## 2017-07-09 ENCOUNTER — Other Ambulatory Visit: Payer: Self-pay | Admitting: Certified Nurse Midwife

## 2017-07-09 DIAGNOSIS — E2839 Other primary ovarian failure: Secondary | ICD-10-CM

## 2017-07-10 ENCOUNTER — Other Ambulatory Visit: Payer: Self-pay | Admitting: Certified Nurse Midwife

## 2017-07-10 DIAGNOSIS — E2839 Other primary ovarian failure: Secondary | ICD-10-CM

## 2017-07-13 ENCOUNTER — Inpatient Hospital Stay: Admission: RE | Admit: 2017-07-13 | Payer: Self-pay | Source: Ambulatory Visit

## 2017-07-13 ENCOUNTER — Ambulatory Visit
Admission: RE | Admit: 2017-07-13 | Discharge: 2017-07-13 | Disposition: A | Discharge status: Home / Self Care | Payer: BC Managed Care – PPO | Source: Ambulatory Visit | Attending: Certified Nurse Midwife | Admitting: Certified Nurse Midwife

## 2017-07-13 ENCOUNTER — Other Ambulatory Visit: Payer: Self-pay

## 2017-07-13 DIAGNOSIS — E2839 Other primary ovarian failure: Secondary | ICD-10-CM

## 2017-07-30 ENCOUNTER — Other Ambulatory Visit: Payer: Self-pay

## 2017-08-18 ENCOUNTER — Ambulatory Visit: Payer: Self-pay | Admitting: Family Medicine

## 2017-08-24 ENCOUNTER — Encounter: Payer: Self-pay | Admitting: Family Medicine

## 2017-08-24 ENCOUNTER — Ambulatory Visit (INDEPENDENT_AMBULATORY_CARE_PROVIDER_SITE_OTHER): Payer: BC Managed Care – PPO | Admitting: Family Medicine

## 2017-08-24 VITALS — BP 122/80 | HR 67 | Wt 155.8 lb

## 2017-08-24 DIAGNOSIS — Z23 Encounter for immunization: Secondary | ICD-10-CM | POA: Diagnosis not present

## 2017-08-24 DIAGNOSIS — M1990 Unspecified osteoarthritis, unspecified site: Secondary | ICD-10-CM | POA: Diagnosis not present

## 2017-08-24 NOTE — Progress Notes (Signed)
   Subjective:    Patient ID: Carol [name], female    DOB: 1950/10/28, 67 y.o.   MRN: [id number]  HPI She is here for recheck.  She does need her second Shingrix vaccine.  She also was recently seen by rheumatology and apparently now has degenerative arthritis.  She has been given a topical medication to use on her fingers.   Review of Systems     Objective:   Physical Exam Alert and in no distress otherwise not examined       Assessment & Plan:  Need for shingles vaccine - Plan: Varicella-zoster vaccine IM (Shingrix)  Arthritis

## 2017-09-21 ENCOUNTER — Other Ambulatory Visit: Payer: Self-pay | Admitting: Family Medicine

## 2017-09-21 DIAGNOSIS — J4530 Mild persistent asthma, uncomplicated: Secondary | ICD-10-CM

## 2017-09-21 NOTE — Telephone Encounter (Signed)
Please advise if [facility] can fill albuterol inhaler. Thanks [employer]

## 2017-10-20 ENCOUNTER — Encounter: Payer: Self-pay | Admitting: Family Medicine

## 2017-10-20 ENCOUNTER — Ambulatory Visit (INDEPENDENT_AMBULATORY_CARE_PROVIDER_SITE_OTHER): Payer: BC Managed Care – PPO | Admitting: Family Medicine

## 2017-10-20 VITALS — BP 102/70 | HR 77 | Temp 97.5°F | Ht 62.0 in | Wt 160.2 lb

## 2017-10-20 DIAGNOSIS — M2242 Chondromalacia patellae, left knee: Secondary | ICD-10-CM | POA: Diagnosis not present

## 2017-10-20 DIAGNOSIS — M7062 Trochanteric bursitis, left hip: Secondary | ICD-10-CM | POA: Diagnosis not present

## 2017-10-20 MED ORDER — LIDOCAINE HCL 2 % IJ SOLN
10.0000 mL | Freq: Once | INTRAMUSCULAR | Status: AC
  Administered 2017-10-20: 200 mg via INTRADERMAL

## 2017-10-20 MED ORDER — TRIAMCINOLONE ACETONIDE 40 MG/ML IJ SUSP
40.0000 mg | Freq: Once | INTRAMUSCULAR | Status: AC
  Administered 2017-10-20: 40 mg via INTRAMUSCULAR

## 2017-10-20 NOTE — Patient Instructions (Signed)
The terminal extension exercises.  3 sets of 10 3 times a day for the next 3 weeks

## 2017-10-20 NOTE — Progress Notes (Signed)
   Subjective:    Patient ID: Carol [name], female    DOB: [birth year], 67 y.o.   MRN: [id number]  HPI She complains of intermittent difficulty with left hip pain.  She notes this either standing or sitting.  It does not get worse with physical activities.  Usually the pain goes away within less than 1 minute.  No numbness, tingling or weakness.  She also complains of a several month history of left knee discomfort and popping sensation that she notes especially when she goes downstairs.  No other joints are involved.  No swelling or redness.   Review of Systems     Objective:   Physical Exam Alert and in no distress.  Full motion of the left hip without pain.  No tenderness over spine or SI joints.  Tender to palpation over the greater trochanteric bursa. Left knee exam shows no effusion.  Medial and lateral collateral ligaments intact.  Negative anterior drawer.  Mid negative McMurray's testing.  Compression testing positive.       Assessment & Plan:  Trochanteric bursitis of left hip  Chondromalacia of left patella  I explained trochanteric bursitis to her and possible therapies.  She elected to have an injection.  The point of maximum pain was identified.  The area was prepped with Betadine.  40 mg of Kenalog and 3 cc of Xylocaine was injected into the affected area with relief of symptoms relatively quickly. Discussed treatment of the chondromalacia.  Demonstrated VMO exercises for her.  Recommended 3 sets of 10 3 times a day for the next 3 weeks.

## 2017-10-20 NOTE — Addendum Note (Signed)
Addended by: Elyse Jarvis on: 10/20/2017 10:16 AM   Modules accepted: Orders

## 2017-10-23 ENCOUNTER — Encounter: Payer: Self-pay | Admitting: Family Medicine

## 2017-10-23 ENCOUNTER — Ambulatory Visit
Admission: RE | Admit: 2017-10-23 | Discharge: 2017-10-23 | Disposition: A | Discharge status: Home / Self Care | Payer: BC Managed Care – PPO | Source: Ambulatory Visit | Attending: Family Medicine | Admitting: Family Medicine

## 2017-10-23 ENCOUNTER — Ambulatory Visit (INDEPENDENT_AMBULATORY_CARE_PROVIDER_SITE_OTHER): Payer: BC Managed Care – PPO | Admitting: Family Medicine

## 2017-10-23 VITALS — BP 134/88 | HR 63 | Temp 97.8°F | Resp 16 | Ht 62.0 in | Wt 157.2 lb

## 2017-10-23 DIAGNOSIS — Z86718 Personal history of other venous thrombosis and embolism: Secondary | ICD-10-CM | POA: Diagnosis not present

## 2017-10-23 DIAGNOSIS — M7989 Other specified soft tissue disorders: Secondary | ICD-10-CM | POA: Diagnosis not present

## 2017-10-23 DIAGNOSIS — R252 Cramp and spasm: Secondary | ICD-10-CM | POA: Diagnosis not present

## 2017-10-23 DIAGNOSIS — M79661 Pain in right lower leg: Secondary | ICD-10-CM | POA: Diagnosis not present

## 2017-10-23 NOTE — Progress Notes (Addendum)
Subjective:    Patient ID: Carol [name], female    DOB: [birth year], 67 y.o.   MRN: [id number]  HPI Chief Complaint  Patient presents with  . other    leg craming for the past two days. Pt has had dvt in past   She is here with complaints of bilateral leg cramps for the past 2 nights but worse in her right calf. States pain became severe in her right calf last night which was similar to when she had a DVT 3 years ago. States her RLE feels "heavy".  History of DVT in right calf on 06/2014 Stopped Xarelto in April 2017 Denies fever, chills, chest pain, palpitations, shortness of breath, orthopnea, abdominal pain, N/V/D.   Reports history of nocturnal leg cramps.  States she ate mustard, drank vinegar and tonic water.   Reviewed allergies, medications, past medical, surgical, family, and social history.    Review of Systems Pertinent positives and negatives in the history of present illness.     Objective:   Physical Exam  Constitutional: She is oriented to person, place, and time. She appears well-developed and well-nourished. No distress.  HENT:  Mouth/Throat: Uvula is midline, oropharynx is clear and moist and mucous membranes are normal.  Eyes: Pupils are equal, round, and reactive to light. Conjunctivae and lids are normal.  Neck: Normal range of motion. Neck supple.  Cardiovascular: Normal rate, regular rhythm, normal heart sounds, intact distal pulses and normal pulses.  Pulmonary/Chest: Effort normal and breath sounds normal.  Normal work of breathing   Musculoskeletal:       Right lower leg: She exhibits tenderness and swelling.       Left lower leg: She exhibits no tenderness and no swelling.  Severe tenderness to right lower calf with asymmetry noted. No redness, warmth.  Intact pulses and cap refill. No coolness compared to LLE.  Positive Homan's RLE. Negative in LLE.   Neurological: She is alert and oriented to person, place, and time. She has normal  strength. No cranial nerve deficit or sensory deficit.  Skin: Skin is warm and dry. Capillary refill takes less than 2 seconds. No rash noted.  Psychiatric: She has a normal mood and affect. Her speech is normal and behavior is normal. Thought content normal.   BP 134/88 (BP Location: Left Arm, Patient Position: Sitting)   Pulse 63   Temp 97.8 F (36.6 C)   Resp 16   Ht 5\' 2"  (1.575 m)   Wt 157 lb 3.2 oz (71.3 kg)   LMP 06/23/1990 (Approximate)   SpO2 99%   BMI 28.75 kg/m        Assessment & Plan:  Pain of right calf  History of DVT of lower extremity - Plan: US Venous Img Lower Unilateral Right  Swelling of lower leg - Plan: US Venous Img Lower Unilateral Right  Nocturnal muscle cramps - Plan: CBC with Differential/Platelet, Comprehensive metabolic panel, Magnesium, TSH  Venous US ordered for later today to rule out DVT or RLE. She was advised to not leave until they call me with the result. She has been on Xarelto in the past and stopped this in April 2017.   If [place] is negative she will use heat, rest her leg and take Tylenol or ibuprofen for pain.  Unlikely for PE but his was discussed and considered. She is aware that if she develops chest pain, palpitations or shortness of breath that she will need to go the the hospital but currently not  having any of these symptoms.  Recommend warm bath, hydration and stretching prior to bed due to leg cramps.  Follow up pending labs.

## 2017-10-24 LAB — CBC WITH DIFFERENTIAL/PLATELET
BASOS: 0 %
Basophils Absolute: 0 10*3/uL (ref 0.0–0.2)
EOS (ABSOLUTE): 0 10*3/uL (ref 0.0–0.4)
EOS: 0 %
HEMATOCRIT: 44.2 % (ref 34.0–46.6)
Hemoglobin: 14.3 g/dL (ref 11.1–15.9)
IMMATURE GRANS (ABS): 0 10*3/uL (ref 0.0–0.1)
IMMATURE GRANULOCYTES: 0 %
LYMPHS: 21 %
Lymphocytes Absolute: 2.1 10*3/uL (ref 0.7–3.1)
MCH: 28.4 pg (ref 26.6–33.0)
MCHC: 32.4 g/dL (ref 31.5–35.7)
MCV: 88 fL (ref 79–97)
Monocytes Absolute: 0.7 10*3/uL (ref 0.1–0.9)
Monocytes: 7 %
NEUTROS PCT: 72 %
Neutrophils Absolute: 6.8 10*3/uL (ref 1.4–7.0)
Platelets: 305 10*3/uL (ref 150–379)
RBC: 5.03 x10E6/uL (ref 3.77–5.28)
RDW: 13.3 % (ref 12.3–15.4)
WBC: 9.7 10*3/uL (ref 3.4–10.8)

## 2017-10-24 LAB — COMPREHENSIVE METABOLIC PANEL
A/G RATIO: 2.1 (ref 1.2–2.2)
ALT: 21 IU/L (ref 0–32)
AST: 24 IU/L (ref 0–40)
Albumin: 4.9 g/dL — ABNORMAL HIGH (ref 3.6–4.8)
Alkaline Phosphatase: 99 IU/L (ref 39–117)
BUN/Creatinine Ratio: 28 (ref 12–28)
BUN: 21 mg/dL (ref 8–27)
Bilirubin Total: 0.3 mg/dL (ref 0.0–1.2)
CALCIUM: 9.8 mg/dL (ref 8.7–10.3)
CO2: 21 mmol/L (ref 20–29)
Chloride: 104 mmol/L (ref 96–106)
Creatinine, Ser: 0.76 mg/dL (ref 0.57–1.00)
GFR calc Af Amer: 96 mL/min/{1.73_m2} (ref >59)
GFR, EST NON AFRICAN AMERICAN: 83 mL/min/{1.73_m2} (ref >59)
GLOBULIN, TOTAL: 2.3 g/dL (ref 1.5–4.5)
Glucose: 89 mg/dL (ref 65–99)
POTASSIUM: 4.6 mmol/L (ref 3.5–5.2)
SODIUM: 140 mmol/L (ref 134–144)
TOTAL PROTEIN: 7.2 g/dL (ref 6.0–8.5)

## 2017-10-24 LAB — TSH: TSH: 1.89 u[IU]/mL (ref 0.450–4.500)

## 2017-10-24 LAB — MAGNESIUM: Magnesium: 2.2 mg/dL (ref 1.6–2.3)

## 2018-01-01 ENCOUNTER — Encounter (HOSPITAL_COMMUNITY): Payer: Self-pay | Admitting: *Deleted

## 2018-01-01 ENCOUNTER — Emergency Department (HOSPITAL_COMMUNITY)
Admission: EM | Admit: 2018-01-01 | Discharge: 2018-01-02 | Disposition: A | Discharge status: Home / Self Care | Payer: BC Managed Care – PPO | Attending: Emergency Medicine | Admitting: Emergency Medicine

## 2018-01-01 DIAGNOSIS — N201 Calculus of ureter: Secondary | ICD-10-CM

## 2018-01-01 DIAGNOSIS — N3001 Acute cystitis with hematuria: Secondary | ICD-10-CM | POA: Diagnosis not present

## 2018-01-01 DIAGNOSIS — R109 Unspecified abdominal pain: Secondary | ICD-10-CM | POA: Diagnosis present

## 2018-01-01 DIAGNOSIS — Z79899 Other long term (current) drug therapy: Secondary | ICD-10-CM | POA: Diagnosis not present

## 2018-01-01 LAB — CBC WITH DIFFERENTIAL/PLATELET
Basophils Absolute: 0 10*3/uL (ref 0.0–0.1)
Basophils Relative: 0 %
EOS ABS: 0.1 10*3/uL (ref 0.0–0.7)
EOS PCT: 1 %
HCT: 41.8 % (ref 36.0–46.0)
Hemoglobin: 13.8 g/dL (ref 12.0–15.0)
LYMPHS PCT: 12 %
Lymphs Abs: 1.6 10*3/uL (ref 0.7–4.0)
MCH: 29.2 pg (ref 26.0–34.0)
MCHC: 33 g/dL (ref 30.0–36.0)
MCV: 88.6 fL (ref 78.0–100.0)
MONO ABS: 0.6 10*3/uL (ref 0.1–1.0)
Monocytes Relative: 5 %
Neutro Abs: 10.3 10*3/uL — ABNORMAL HIGH (ref 1.7–7.7)
Neutrophils Relative %: 82 %
PLATELETS: 277 10*3/uL (ref 150–400)
RBC: 4.72 MIL/uL (ref 3.87–5.11)
RDW: 13.1 % (ref 11.5–15.5)
WBC: 12.7 10*3/uL — AB (ref 4.0–10.5)

## 2018-01-01 LAB — URINALYSIS, ROUTINE W REFLEX MICROSCOPIC
BILIRUBIN URINE: NEGATIVE
Glucose, UA: NEGATIVE mg/dL
HGB URINE DIPSTICK: NEGATIVE
KETONES UR: NEGATIVE mg/dL
NITRITE: POSITIVE — AB
PH: 7 (ref 5.0–8.0)
Protein, ur: NEGATIVE mg/dL
Specific Gravity, Urine: 1.016 (ref 1.005–1.030)

## 2018-01-01 LAB — BASIC METABOLIC PANEL
Anion gap: 6 (ref 5–15)
BUN: 17 mg/dL (ref 8–23)
CALCIUM: 9.1 mg/dL (ref 8.9–10.3)
CO2: 25 mmol/L (ref 22–32)
Chloride: 113 mmol/L — ABNORMAL HIGH (ref 98–111)
Creatinine, Ser: 0.91 mg/dL (ref 0.44–1.00)
GFR calc Af Amer: >60 mL/min (ref >60)
GLUCOSE: 117 mg/dL — AB (ref 70–99)
Potassium: 4.1 mmol/L (ref 3.5–5.1)
SODIUM: 144 mmol/L (ref 135–145)

## 2018-01-01 MED ORDER — ONDANSETRON HCL 4 MG/2ML IJ SOLN
4.0000 mg | Freq: Once | INTRAMUSCULAR | Status: AC
  Administered 2018-01-01: 4 mg via INTRAVENOUS
  Filled 2018-01-01: qty 2

## 2018-01-01 MED ORDER — HYDROMORPHONE HCL 1 MG/ML IJ SOLN
0.5000 mg | Freq: Once | INTRAMUSCULAR | Status: AC
  Administered 2018-01-01: 0.5 mg via INTRAVENOUS
  Filled 2018-01-01: qty 1

## 2018-01-01 MED ORDER — SODIUM CHLORIDE 0.9 % IV SOLN
1.0000 g | Freq: Once | INTRAVENOUS | Status: AC
  Administered 2018-01-01: 1 g via INTRAVENOUS
  Filled 2018-01-01: qty 10

## 2018-01-01 MED ORDER — SODIUM CHLORIDE 0.9 % IV BOLUS
500.0000 mL | Freq: Once | INTRAVENOUS | Status: AC
  Administered 2018-01-01: 500 mL via INTRAVENOUS

## 2018-01-01 NOTE — ED Triage Notes (Signed)
Pt reports flank pain that started last week and gradually got much worse. Pt sts this afternoon she also developed nausea, vomiting, dysuria. Pt reports hx of kidney stones and sts this pain feels the same.

## 2018-01-01 NOTE — ED Provider Notes (Signed)
[place] DEPT Provider Note   CSN: [id number] Arrival date & time: 01/01/18  2113     History   Chief Complaint Chief Complaint  Patient presents with  . Flank Pain    HPI Carol [name] is a 67 y.o. female.  Patient with a history of kidney stones presents with left flank pain that started last week. Pain resolved for several days and returned 2-3 days ago and has gotten progressively worse. Today she developed severe pain, diaphoresis, nausea and dysuria. No known fever. She feels she has to urinate but does not produce much when she goes, and then still feels she has urine to pass. No hematuria.   The history is provided by the patient. No language interpreter was used.  Flank Pain  Pertinent negatives include no chest pain and no shortness of breath.    Past Medical History:  Diagnosis Date  . Abnormal Pap smear of cervix    in 20's  . Anxiety    claustrophobic  . Arthritis    "neck, hands" (09/02/2013)  . Basal cell carcinoma of forehead 2008; 2014  . Dizziness last occurance 10-15-2014   on occasion, not related to activity  . DVT (deep venous thrombosis) ([place]) 06/28/2014   [facility] [employer], right leg, behind knee  . Environmental allergies   . Exercise-induced asthma 30's  . GERD (gastroesophageal reflux disease)   . H/O hiatal hernia   . Herpes simplex 1976  . History of kidney stones   . Hyperlipidemia   . Migraine teens, returned in 40"s   "depends on the change in the weather; last one was 1 wk ago" (09/02/2013)  . Plantar fasciitis   . PONV (postoperative nausea and vomiting) 3/ 2015   ponv after gallblaffer  . Right ureteral stone   . Seasonal allergic rhinitis   . Tubular adenoma of colon 05/22/2017   per path report.     Patient Active Problem List   Diagnosis Date Noted  . History of DVT of lower extremity 10/23/2017  . Nocturnal muscle cramps 10/23/2017  . Tubular adenoma of colon 05/21/2016  . S/P  laparoscopic cholecystectomy March 2015 09/03/2013  . Hyperlipidemia with target LDL less than 100 06/27/2011  . Herpes 12/16/2010  . GERD (gastroesophageal reflux disease) 12/16/2010  . HH (hiatus hernia) 12/16/2010  . Hot flashes, menopausal 12/16/2010  . Diverticulosis 12/16/2010  . Asthma 12/16/2010  . 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 ABDOMINAL HYSTERECTOMY  1992  . CESAREAN SECTION  1981  . CHOLECYSTECTOMY N/A 09/02/2013   Procedure: LAPAROSCOPIC CHOLECYSTECTOMY;  Surgeon: Harl Bowie, MD;  Location: [facility];  Service: General;  Laterality: N/A;  . CRYOTHERAPY     for abnormal pap smear in 20's  . CYSTOSCOPY W/ URETERAL STENT PLACEMENT  03/05/2012   Procedure: CYSTOSCOPY WITH RETROGRADE PYELOGRAM/URETERAL STENT PLACEMENT;  Surgeon: Hanley Ben, MD;  Location: [facility];  Service: Urology;  Laterality: Right;  . CYSTOSCOPY W/ URETERAL STENT PLACEMENT Right 01/19/2015   Procedure: CYSTOSCOPY WITH RETROGRADE PYELOGRAM/URETERAL  RIGHT STENT PLACEMENT;  Surgeon: Alexis Frock, MD;  Location: [facility];  Service: Urology;  Laterality: Right;  . CYSTOSCOPY WITH RETROGRADE PYELOGRAM, URETEROSCOPY AND STENT PLACEMENT Right 09/08/2014   Procedure: CYSTOSCOPY WITH RIGHT  RETROGRADE PYELOGRAM, URETEROSCOPY AND RIGHT STENT PLACEMENT;  Surgeon: Raynelle Bring, MD;  [facility];  Service: Urology;  Laterality: Right;  . CYSTOSCOPY WITH RETROGRADE PYELOGRAM, URETEROSCOPY  AND STENT PLACEMENT Right 10/19/2014   Procedure: CYSTOSCOPY WITH Bilateral RETROGRADE PYELOGRAM/RIGHT URETEROSCOPY,RIGHT JJ STENT;  Surgeon: Carolan Clines, MD;  [facility];  Service: Urology;  Laterality: Right;  . CYSTOSCOPY/RETROGRADE/URETEROSCOPY  11/18/2011   Procedure: CYSTOSCOPY/RETROGRADE/URETEROSCOPY;  Surgeon: Ailene Rud, MD;  [facility];  Service: Urology;  Laterality: Right;  .  CYSTOSCOPY/RETROGRADE/URETEROSCOPY/STONE EXTRACTION WITH BASKET  10/21/2011   Procedure: CYSTOSCOPY/RETROGRADE/URETEROSCOPY/STONE EXTRACTION WITH BASKET;  Surgeon: Ailene Rud, MD;  Location: [facility];  Service: Urology;  Laterality: Right;  . CYSTOSCOPY/RETROGRADE/URETEROSCOPY/STONE EXTRACTION WITH BASKET Right 09/10/2012   Procedure: RIGHT URETEROSCOPY/STONE EXTRACTION/LASER LITHOTRIPSY/POSSIBLE DOUBLE J STENT PLACEMENT;  Surgeon: Ailene Rud, MD;  [facility];  Service: Urology;  Laterality: Right;  . MOHS SURGERY  2008; 2014  . PLANTAR FASCIA SURGERY  05/2016  . POSTERIOR PINNACLE MESH SACROSPINOUS REPAIR  11-22-2010   RECURRENT ENTEROCELE  . RIGHT URETEROSCOPIC STONE EXTRACTION W/ STENT PLACEMENT  08-12-2011   x2 stents  . ROBOTICALLY ASSISTED LAPAROSCOPIC URETERAL RE-IMPLANTATION Right 01/19/2015   Procedure: ROBOTICALLY ASSISTED LAPAROSCOPIC URETERAL RE-IMPLANTATION RIGHT;  Surgeon: Alexis Frock, MD;  [facility];  Service: Urology;  Laterality: Right;  . TUBAL LIGATION  1991  . VAULT SUSPENSION (DERMAL GRAFT)/ ENTEROCELE AND RECTOCELE REPAIR  12-05-2005   VAULT PROLAPSE/ ENTERCELE/ RECTOCELE     OB History    Gravida  3   Para  3   Term  3   Preterm  0   AB  0   Living  3     SAB  0   TAB  0   Ectopic  0   Multiple  0   Live Births  3            Home Medications    Prior to Admission medications   Medication Sig Start Date End Date Taking? Authorizing Provider  ASMANEX 60 METERED DOSES 220 MCG/INH inhaler USE 1 PUFF TWICE A DAY AS DIRECTED 03/31/17   Tysinger, Camelia Eng, PA-C  atorvastatin (LIPITOR) 20 MG tablet TAKE 1 TABLET ONCE DAILY. 06/02/17   Denita Lung, MD  chlordiazePOXIDE (LIBRIUM) 10 MG capsule Take 10 mg by mouth at bedtime.    [provider]  dexlansoprazole (DEXILANT) 60 MG capsule Take 60 mg by mouth every morning.     [provider]  METHOCARBAMOL PO Take 1 tablet by mouth.     [provider]  PROAIR HFA 108 (90 Base) MCG/ACT inhaler INHALE 2 PUFFS INTO THE LUNGS EVERY 6 HOURS AS NEEDED FOR WHEEZING/ SHORTNESS OF BREATH. 09/21/17   Denita Lung, MD  rizatriptan (MAXALT) 10 MG tablet Take 10 mg by mouth as needed for migraine. May repeat in 2 hours if needed    [provider]  valACYclovir (VALTREX) 500 MG tablet Take 1 tablet (500 mg total) by mouth daily. 06/29/17   Megan Salon, MD  zonisamide (ZONEGRAN) 100 MG capsule Take 200 mg by mouth at bedtime.     [provider]    Family History Family History  Problem Relation Age of Onset  . Cancer Father     Social History Social History   Tobacco Use  . Smoking status: Never Smoker  . Smokeless tobacco: Never Used  Substance Use Topics  . Alcohol use: Yes    Comment: twice a month  . Drug use: No     Allergies   Iohexol; Ivp dye [iodinated diagnostic agents]; Codeine; and Onion   Review of Systems Review  of Systems  Constitutional: Positive for diaphoresis. Negative for fever.  Respiratory: Negative for cough and shortness of breath.   Cardiovascular: Negative for chest pain.  Gastrointestinal: Positive for nausea.  Genitourinary: Positive for dysuria, flank pain and frequency.  Musculoskeletal: Negative for myalgias.  Neurological: Positive for weakness.     Physical Exam Updated Vital Signs BP 140/82 (BP Location: Right Arm)   Pulse 71   Temp 97.6 F (36.4 C) (Oral)   Resp 18   Ht 5' (1.524 m)   Wt 70.3 kg (155 lb)   LMP 06/23/1990 (Approximate)   SpO2 96%   BMI 30.27 kg/m   Physical Exam  Constitutional: She appears well-developed and well-nourished. No distress.  Uncomfortable appearing.  HENT:  Head: Normocephalic.  Neck: Normal range of motion. Neck supple.  Cardiovascular: Normal rate.  Pulmonary/Chest: Effort normal.  Abdominal: Soft. Bowel sounds are normal. There is no tenderness. There is no rebound and no guarding.  Genitourinary:    Genitourinary Comments: Right CVA tenderness.   Musculoskeletal: Normal range of motion.  Neurological: She is alert.  Skin: Skin is warm and dry. No rash noted.  Psychiatric: She has a normal mood and affect.  Nursing note and vitals reviewed.    ED Treatments / Results  Labs (all labs ordered are listed, but only abnormal results are displayed) Labs Reviewed  URINALYSIS, ROUTINE W REFLEX MICROSCOPIC - Abnormal; Notable for the following components:      Result Value   APPearance CLOUDY (*)    Nitrite POSITIVE (*)    Leukocytes, UA LARGE (*)    Bacteria, UA FEW (*)    All other components within normal limits  CBC WITH DIFFERENTIAL/PLATELET  BASIC METABOLIC PANEL    EKG None  Radiology No results found.  Procedures Procedures (including critical care time)  Medications Ordered in ED Medications  ondansetron (ZOFRAN) injection 4 mg (has no administration in time range)  HYDROmorphone (DILAUDID) injection 0.5 mg (has no administration in time range)  sodium chloride 0.9 % bolus 500 mL (has no administration in time range)     Initial Impression / Assessment and Plan / ED Course  I have reviewed the triage vital signs and the nursing notes.  Pertinent labs & imaging results that were available during my care of the patient were reviewed by me and considered in my medical decision making (see chart for details).     Patient presents with left flank pain progressive over one week with onset diaphoresis, nausea and dysuria/frequency today. History of complicated right kidney stones that ultimately required surgery to bypass ureteral scarring (D. Manny). She reports flank pain is similar pain.   She reports having multiple CT scans in the last year. Will start with KUB, labs. Pain/nausea medications provided.   Pain and nausea controlled for now. KUB unremarkable with no visualized stone. Feel that with presence of stone history, known stones in the left kidney, KUB  results are unreliable. Will get CT scan.  CT shows 3 mm stone in distal left ureter with mild hydronephrosis. Nausea has been difficult to control. Additional zofran without relief. Will give Reglan and reassess.   Discussed the patient's presentation with Dr. Clydene Laming, urology, who advises he will come see the patient to determine disposition. Appreciate his help with this patient.   Per Dr. Clydene Laming the patient is felt appropriate for discharge home. Will provide pain and nausea medication; fluconazole and levaquin for findings on UA and past culture comparisons; office follow up in one  week. Return precautions were discussed with the patient.     Final Clinical Impressions(s) / ED Diagnoses   Final diagnoses:  None   1. UTI 2. Left ureterolithiasis   ED Discharge Orders    None       Charlann Lange, PA-C 01/02/18 9924    Charlesetta Shanks, MD 01/09/18 1259

## 2018-01-02 ENCOUNTER — Emergency Department (HOSPITAL_COMMUNITY): Payer: BC Managed Care – PPO

## 2018-01-02 DIAGNOSIS — N201 Calculus of ureter: Secondary | ICD-10-CM | POA: Diagnosis not present

## 2018-01-02 MED ORDER — ONDANSETRON 4 MG PO TBDP: 4.0000 mg | ORAL_TABLET | Freq: Three times a day (TID) | ORAL | 0 refills | Status: DC | PRN

## 2018-01-02 MED ORDER — ONDANSETRON HCL 4 MG/2ML IJ SOLN
4.0000 mg | Freq: Once | INTRAMUSCULAR | Status: AC
  Administered 2018-01-02: 4 mg via INTRAVENOUS
  Filled 2018-01-02: qty 2

## 2018-01-02 MED ORDER — FLUCONAZOLE 200 MG PO TABS: 200.0000 mg | ORAL_TABLET | Freq: Every day | ORAL | 0 refills | Status: AC

## 2018-01-02 MED ORDER — LEVOFLOXACIN 500 MG PO TABS: 500.0000 mg | ORAL_TABLET | Freq: Every day | ORAL | 0 refills | Status: DC

## 2018-01-02 MED ORDER — TAMSULOSIN HCL 0.4 MG PO CAPS: 0.4000 mg | ORAL_CAPSULE | Freq: Every day | ORAL | 0 refills | Status: DC

## 2018-01-02 MED ORDER — METOCLOPRAMIDE HCL 5 MG/ML IJ SOLN
10.0000 mg | Freq: Once | INTRAMUSCULAR | Status: AC
  Administered 2018-01-02: 10 mg via INTRAVENOUS
  Filled 2018-01-02: qty 2

## 2018-01-02 MED ORDER — PROMETHAZINE HCL 25 MG/ML IJ SOLN
6.2500 mg | Freq: Once | INTRAMUSCULAR | Status: AC
  Administered 2018-01-02: 6.25 mg via INTRAVENOUS
  Filled 2018-01-02: qty 1

## 2018-01-02 MED ORDER — OXYCODONE-ACETAMINOPHEN 5-325 MG PO TABS: 1.0000 | ORAL_TABLET | ORAL | 0 refills | Status: DC | PRN

## 2018-01-02 MED ORDER — HYDROMORPHONE HCL 1 MG/ML IJ SOLN
0.5000 mg | Freq: Once | INTRAMUSCULAR | Status: AC
  Administered 2018-01-02: 0.5 mg via INTRAVENOUS
  Filled 2018-01-02: qty 1

## 2018-01-02 NOTE — Discharge Instructions (Addendum)
Follow up with Dr. Tresa Moore in the office in 1-2 weeks for recheck. Use the urinary strainer when urinating to catch the stone. Take medications as prescribed. Return to the [facility] Emergency department with any high fever, severe/uncontrolled pain or vomiting, or for new concern.

## 2018-01-02 NOTE — ED Notes (Signed)
Bed: LL97 Expected date:  Expected time:  Means of arrival:  Comments: Hold 1-8

## 2018-01-02 NOTE — Consult Note (Addendum)
Urology Consult Note   Requesting Attending Physician: Charlann Lange, PA Service Providing Consult: Urology  Consulting Attending: Alexis Frock, MD   Reason for Consult:  Nephrolithiasis  HPI: Carol [name] is seen in consultation for reasons noted above at the request of No att. providers found for evaluation of left obstructing kidney stone.  This is a 67 y.o. 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 She has been doing well from urologic perspecitve since that time.  On 7/11, she began experiencing left sided back/flank pain. Her symptoms worsened, prompting her presentation today. Additionally, she began experiencing nausea/vomiting. A CT scan was obtained that revealed a 13mm obstructing stone in the distal ureter with associated mild hydroureteronephrosis. Additionally, she has another 77mm non-obstructing stone in the left lower pole. The right kidney appears normal.  Patient specifically denies fevers/chills at home. She does endorse nausea/vomiting. She denies dysuria. She does describe urinary urgency and frequency. She does report frequent UTIs, but does not feel her urinary symptoms are typical of a UTI. She denies recent stone passage.   Past Medical History: Past Medical History:  Diagnosis Date  . Abnormal Pap smear of cervix    in 20's  . Anxiety    claustrophobic  . Arthritis    "neck, hands" (09/02/2013)  . Basal cell carcinoma of forehead 2008; 2014  . Dizziness last occurance 10-15-2014   on occasion, not related to activity  . 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 Environmental allergies   . Exercise-induced asthma 30's  . GERD (gastroesophageal reflux disease)   . H/O hiatal hernia   . Herpes simplex 1976  . History of kidney stones   . Hyperlipidemia   . Migraine teens, returned in 40"s   "depends on the change in the weather;  last one was 1 wk ago" (09/02/2013)  . Plantar fasciitis   . PONV (postoperative nausea and vomiting) 3/ 2015   ponv after gallblaffer  . Right ureteral stone   . Seasonal allergic rhinitis   . Tubular adenoma of colon 05/22/2017   per path report.     Past Surgical History:  Past Surgical History:  Procedure Laterality Date  . ABDOMINAL HYSTERECTOMY  1992  . CESAREAN SECTION  1981  . CHOLECYSTECTOMY N/A 09/02/2013   Procedure: LAPAROSCOPIC CHOLECYSTECTOMY;  Surgeon: Harl Bowie, MD;  Location: [place];  Service: General;  Laterality: N/A;  . CRYOTHERAPY     for abnormal pap smear in 20's  . CYSTOSCOPY W/ URETERAL STENT PLACEMENT  03/05/2012   Procedure: CYSTOSCOPY WITH RETROGRADE PYELOGRAM/URETERAL STENT PLACEMENT;  Surgeon: Hanley Ben, MD;  Location: [place];  Service: Urology;  Laterality: Right;  . CYSTOSCOPY W/ URETERAL STENT PLACEMENT Right 01/19/2015   Procedure: CYSTOSCOPY WITH RETROGRADE PYELOGRAM/URETERAL  RIGHT STENT PLACEMENT;  Surgeon: Alexis Frock, MD;  [facility];  Service: Urology;  Laterality: Right;  . CYSTOSCOPY WITH RETROGRADE PYELOGRAM, URETEROSCOPY AND STENT PLACEMENT Right 09/08/2014   Procedure: CYSTOSCOPY WITH RIGHT  RETROGRADE PYELOGRAM, URETEROSCOPY AND RIGHT STENT PLACEMENT;  Surgeon: Raynelle Bring, MD;  [facility];  Service: Urology;  Laterality: Right;  . CYSTOSCOPY WITH RETROGRADE PYELOGRAM, URETEROSCOPY AND STENT PLACEMENT Right 10/19/2014   Procedure: CYSTOSCOPY WITH Bilateral RETROGRADE PYELOGRAM/RIGHT URETEROSCOPY,RIGHT JJ STENT;  Surgeon: Carolan Clines, MD;  [facility];  Service: Urology;  Laterality: Right;  . CYSTOSCOPY/RETROGRADE/URETEROSCOPY  11/18/2011   Procedure: CYSTOSCOPY/RETROGRADE/URETEROSCOPY;  Surgeon: Pierre Bali  Joya San, MD;  [facility];  Service: Urology;  Laterality: Right;  . CYSTOSCOPY/RETROGRADE/URETEROSCOPY/STONE EXTRACTION WITH BASKET  10/21/2011   Procedure:  CYSTOSCOPY/RETROGRADE/URETEROSCOPY/STONE EXTRACTION WITH BASKET;  Surgeon: Ailene Rud, MD;  Location: [facility];  Service: Urology;  Laterality: Right;  . CYSTOSCOPY/RETROGRADE/URETEROSCOPY/STONE EXTRACTION WITH BASKET Right 09/10/2012   Procedure: RIGHT URETEROSCOPY/STONE EXTRACTION/LASER LITHOTRIPSY/POSSIBLE DOUBLE J STENT PLACEMENT;  Surgeon: Ailene Rud, MD;  [facility];  Service: Urology;  Laterality: Right;  . MOHS SURGERY  2008; 2014  . PLANTAR FASCIA SURGERY  05/2016  . POSTERIOR PINNACLE MESH SACROSPINOUS REPAIR  11-22-2010   RECURRENT ENTEROCELE  . RIGHT URETEROSCOPIC STONE EXTRACTION W/ STENT PLACEMENT  08-12-2011   x2 stents  . ROBOTICALLY ASSISTED LAPAROSCOPIC URETERAL RE-IMPLANTATION Right 01/19/2015   Procedure: ROBOTICALLY ASSISTED LAPAROSCOPIC URETERAL RE-IMPLANTATION RIGHT;  Surgeon: Alexis Frock, MD;  [facility];  Service: Urology;  Laterality: Right;  . TUBAL LIGATION  1991  . VAULT SUSPENSION (DERMAL GRAFT)/ ENTEROCELE AND RECTOCELE REPAIR  12-05-2005   VAULT PROLAPSE/ ENTERCELE/ RECTOCELE    Medication: No current facility-administered medications for this encounter.    Current Outpatient Medications  Medication Sig Dispense Refill  . ASMANEX 60 METERED DOSES 220 MCG/INH inhaler USE 1 PUFF TWICE A DAY AS DIRECTED 1 Inhaler 0  . atorvastatin (LIPITOR) 20 MG tablet TAKE 1 TABLET ONCE DAILY. 90 tablet 3  . chlordiazePOXIDE (LIBRIUM) 10 MG capsule Take 10 mg by mouth at bedtime.    Marland Kitchen dexlansoprazole (DEXILANT) 60 MG capsule Take 60 mg by mouth every morning.     . diclofenac sodium (VOLTAREN) 1 % GEL Apply 2 g topically 4 (four) times daily.    Marland Kitchen METHOCARBAMOL PO Take 500 mg by mouth daily as needed (spasms).     Marland Kitchen PROAIR HFA 108 (90 Base) MCG/ACT inhaler INHALE 2 PUFFS INTO THE LUNGS EVERY 6 HOURS AS NEEDED FOR WHEEZING/ SHORTNESS OF BREATH. 8.5 g 0  . rizatriptan (MAXALT) 10 MG tablet Take 10 mg by mouth as needed for  migraine. May repeat in 2 hours if needed    . valACYclovir (VALTREX) 500 MG tablet Take 1 tablet (500 mg total) by mouth daily. 90 tablet 4  . zonisamide (ZONEGRAN) 100 MG capsule Take 200 mg by mouth at bedtime.       Allergies: Allergies  Allergen Reactions  . Iohexol Anaphylaxis     Code: HIVES, Desc: hives during ivp '96, ok w/ 13 hr prep @ [facility] Onset Date: [id number]   Desc: hives   . Ivp Dye [Iodinated Diagnostic Agents] Anaphylaxis  . Codeine Nausea And Vomiting  . Onion Other (See Comments)    Red Onion Only - Nausea, Vomiting and Passes Out    Social History: Social History   Tobacco Use  . Smoking status: Never Smoker  . Smokeless tobacco: Never Used  Substance Use Topics  . Alcohol use: Yes    Comment: twice a month  . Drug use: No    Family History Family History  Problem Relation Age of Onset  . Cancer Father     Review of Systems 10 systems were reviewed and are negative except as noted specifically in the HPI.  Objective   Vital signs in last 24 hours: BP 111/68 (BP Location: Right Arm)   Pulse 66   Temp 97.6 F (36.4 C) (Oral)   Resp 14   Ht 5' (1.524 m)   Wt 70.3 kg (155 lb)   LMP 06/23/1990 (Approximate)   SpO2  98%   BMI 30.27 kg/m   Physical Exam General: NAD, A&O, resting, appropriate HEENT: [place]/AT, EOMI, MMM Pulmonary: Normal work of breathing Cardiovascular: HDS, adequate peripheral perfusion Abdomen: Soft, NTTP, nondistended GU: LEFT CVAT, no right CVAT Extremities: warm and well perfused Neuro: Appropriate, no focal neurological deficits  Most Recent Labs: Lab Results  Component Value Date   WBC 12.7 (H) 01/01/2018   HGB 13.8 01/01/2018   HCT 41.8 01/01/2018   PLT 277 01/01/2018    Lab Results  Component Value Date   NA 144 01/01/2018   K 4.1 01/01/2018   CL 113 (H) 01/01/2018   CO2 25 01/01/2018   BUN 17 01/01/2018   CREATININE 0.91 01/01/2018   CALCIUM 9.1 01/01/2018   MG 2.2 10/23/2017    No results  found for: INR, APTT   IMAGING: Dg Abdomen 1 View  Result Date: 01/02/2018 CLINICAL DATA:  Left flank pain EXAM: ABDOMEN - 1 VIEW COMPARISON:  None. FINDINGS: The bowel gas pattern is normal. No radio-opaque calculi or other significant radiographic abnormality are seen. IMPRESSION: No radiopaque nephroureterolithiasis. Electronically Signed   By: Ulyses Jarred M.D.   On: 01/02/2018 00:54   Ct Renal Stone Study  Result Date: 01/02/2018 CLINICAL DATA:  67 year old female with flank pain. Concern for pyelonephritis. EXAM: CT ABDOMEN AND PELVIS WITHOUT CONTRAST TECHNIQUE: Multidetector CT imaging of the abdomen and pelvis was performed following the standard protocol without IV contrast. COMPARISON:  Abdominal radiograph dated 01/02/2018 and CT of the abdomen pelvis dated 06/01/2015. FINDINGS: Evaluation of this exam is limited in the absence of intravenous contrast. Lower chest: The visualized lung bases are clear. No intra-abdominal free air or free fluid. Hepatobiliary: Cholecystectomy. The liver is unremarkable. No intrahepatic biliary ductal dilatation. Pancreas: Unremarkable. No pancreatic ductal dilatation or surrounding inflammatory changes. Spleen: Normal in size without focal abnormality. Adrenals/Urinary Tract: The adrenal glands are unremarkable. There is a 3 mm stone in the distal left ureter with mild left hydronephrosis. An additional 3 mm nonobstructing stone is seen in the inferior pole of the left kidney. There is no hydronephrosis or nephrolithiasis on the right. The urinary bladder is unremarkable. Stomach/Bowel: There is a 3 cm distal duodenal diverticulum. There are sigmoid diverticula. There is no bowel obstruction or active inflammation. The appendix is normal. Vascular/Lymphatic: The abdominal aorta and IVC are grossly unremarkable on this noncontrast CT. No portal venous gas. There is no adenopathy. Reproductive: Hysterectomy.  No pelvic mass. Other: None Musculoskeletal: No acute or  significant osseous findings. IMPRESSION: 1. A 3 mm distal left ureteral stone with mild left hydronephrosis. Additional nonobstructing 3 mm left renal interpolar calculus noted. 2. Colonic diverticulosis. No bowel obstruction or active inflammation. Normal appendix. Electronically Signed   By: Anner Crete M.D.   On: 01/02/2018 02:02    ------  Assessment:  67 y.o. female with history of right ureteral stricture s/p reimplantation presenting with obstructing 43mm distal LEFT ureteral stone. Additionally, patient with 23mm non-obstructing left lower pole stone.  Patient afebrile and HDS. Non-toxic appearing. Mild leukocytosis of 12.7. UA with large LE, positive nitrite, few bacteria, yeast, 21-50 RBCs, 21-50 WBCs. No dysuria. UCx pending. Cr WNL. Pain significantly improved with medication.  Discussed options with patient that include left ureteral stent placement versus trial of passage with strict return precautions as described below. We discussed the risks and benefits of each option in detail. Given the size and location of the stone as well as the fact that the patient is afebrile and  HDS without signs of systemic infection, we feel that trial of passage with medical expulsive therapy is a safe option. Patient is in agreement and it is her preference to avoid additional procedures at this time.   Recommendations: - OK to discharge from urologic perspective. - Recommend sending home with PO pain and nausea medication, Flomax 0.4mg , urine strainer. - Return precautions include fevers/chills, intractable nausea/vomiting, or pain limiting self care. - Follow up urine culture results. Please prescribed 7 day empiric course of PO levofloxacin and PO fluconazole based on patient's urinalysis and prior culture history. - Patient will follow up with urology in 1-2 weeks with RUS, patient instructed to bring stone for chemical analysis if passed.   Thank you for this consult. Please contact the  urology consult pager with any further questions/concerns.  Imagind ans labs reviewed. Agree with rial of medical therapy given small stone size with strong warign abotu infecitous precautions and need for repeat evl if new fevers / progressive maliase develops.

## 2018-01-05 LAB — URINE CULTURE: Culture: >=100000 — AB

## 2018-01-06 ENCOUNTER — Telehealth: Payer: Self-pay | Admitting: *Deleted

## 2018-01-06 NOTE — Telephone Encounter (Signed)
Post ED Visit - Positive Culture Follow-up  Culture report reviewed by antimicrobial stewardship pharmacist:  []  Elenor Quinones, Pharm.D. []  Heide Guile, Pharm.D., BCPS AQ-ID []  Parks Neptune, Pharm.D., BCPS []  Alycia Rossetti, Pharm.D., BCPS []  [place], [place].D., BCPS, AAHIVP []  Legrand Como, Pharm.D., BCPS, AAHIVP [x]  Salome Arnt, PharmD, BCPS []  Johnnette Gourd, PharmD, BCPS []  Hughes Better, PharmD, BCPS []  Leeroy Cha, PharmD  Positive urine culture Treated with Levofloxacin, organism sensitive to the same and no further patient follow-up is required at this time.  Harlon Flor [facility] 01/06/2018, 10:35 AM

## 2018-03-15 ENCOUNTER — Other Ambulatory Visit: Payer: Self-pay | Admitting: Family Medicine

## 2018-03-15 DIAGNOSIS — Z1231 Encounter for screening mammogram for malignant neoplasm of breast: Secondary | ICD-10-CM

## 2018-04-28 ENCOUNTER — Ambulatory Visit
Admission: RE | Admit: 2018-04-28 | Discharge: 2018-04-28 | Disposition: A | Discharge status: Home / Self Care | Payer: BC Managed Care – PPO | Source: Ambulatory Visit | Attending: Family Medicine | Admitting: Family Medicine

## 2018-04-28 DIAGNOSIS — Z1231 Encounter for screening mammogram for malignant neoplasm of breast: Secondary | ICD-10-CM

## 2018-05-31 ENCOUNTER — Other Ambulatory Visit: Payer: Self-pay | Admitting: Family Medicine

## 2018-06-04 ENCOUNTER — Ambulatory Visit: Payer: Self-pay | Admitting: Certified Nurse Midwife

## 2018-06-10 ENCOUNTER — Ambulatory Visit (INDEPENDENT_AMBULATORY_CARE_PROVIDER_SITE_OTHER): Payer: BC Managed Care – PPO | Admitting: Family Medicine

## 2018-06-10 ENCOUNTER — Encounter: Payer: Self-pay | Admitting: Family Medicine

## 2018-06-10 VITALS — BP 118/82 | HR 79 | Temp 97.5°F | Wt 160.6 lb

## 2018-06-10 DIAGNOSIS — J4530 Mild persistent asthma, uncomplicated: Secondary | ICD-10-CM | POA: Diagnosis not present

## 2018-06-10 DIAGNOSIS — J029 Acute pharyngitis, unspecified: Secondary | ICD-10-CM

## 2018-06-10 LAB — POCT RAPID STREP A (OFFICE): Rapid Strep A Screen: NEGATIVE

## 2018-06-10 MED ORDER — MOMETASONE FUROATE 220 MCG/INH IN AEPB: INHALATION_SPRAY | RESPIRATORY_TRACT | 11 refills | Status: DC

## 2018-06-10 NOTE — Progress Notes (Signed)
   Subjective:    Patient ID: Carol [name], female    DOB: [birth year], 67 y.o.   MRN: [id number]  HPI She has a 3-day history of sore throat, sinus congestion, earache, slight cough with PND but no fever or chills. She also needs a refill on her Asmanex. Review of Systems     Objective:   Physical Exam Alert and in no distress. Tympanic membranes and canals are normal. Pharyngeal area is normal. Neck is supple without adenopathy or thyromegaly. Cardiac exam shows a regular sinus rhythm without murmurs or gallops. Lungs are clear to auscultation. Strep screen is negative       Assessment & Plan:  Sore throat - Plan: Rapid Strep A  Mild persistent asthma without complication - Plan: mometasone (ASMANEX, 60 METERED DOSES,) 220 MCG/INH inhaler Recommend supportive care for her sore throat.  If she still has symptoms next week, she will call.

## 2018-07-08 ENCOUNTER — Ambulatory Visit (INDEPENDENT_AMBULATORY_CARE_PROVIDER_SITE_OTHER): Payer: BC Managed Care – PPO | Admitting: Certified Nurse Midwife

## 2018-07-08 ENCOUNTER — Other Ambulatory Visit: Payer: Self-pay

## 2018-07-08 ENCOUNTER — Encounter: Payer: Self-pay | Admitting: Certified Nurse Midwife

## 2018-07-08 VITALS — BP 118/78 | HR 64 | Resp 16 | Ht 60.25 in | Wt 159.0 lb

## 2018-07-08 DIAGNOSIS — N390 Urinary tract infection, site not specified: Secondary | ICD-10-CM | POA: Diagnosis not present

## 2018-07-08 DIAGNOSIS — Z8619 Personal history of other infectious and parasitic diseases: Secondary | ICD-10-CM | POA: Diagnosis not present

## 2018-07-08 DIAGNOSIS — Z0000 Encounter for general adult medical examination without abnormal findings: Secondary | ICD-10-CM | POA: Diagnosis not present

## 2018-07-08 DIAGNOSIS — Z01411 Encounter for gynecological examination (general) (routine) with abnormal findings: Secondary | ICD-10-CM

## 2018-07-08 DIAGNOSIS — Z8669 Personal history of other diseases of the nervous system and sense organs: Secondary | ICD-10-CM

## 2018-07-08 DIAGNOSIS — N952 Postmenopausal atrophic vaginitis: Secondary | ICD-10-CM

## 2018-07-08 DIAGNOSIS — R319 Hematuria, unspecified: Secondary | ICD-10-CM

## 2018-07-08 LAB — POCT URINALYSIS DIPSTICK
BILIRUBIN UA: NEGATIVE
Glucose, UA: NEGATIVE
KETONES UA: NEGATIVE
Nitrite, UA: POSITIVE
PH UA: 5 (ref 5.0–8.0)
PROTEIN UA: NEGATIVE
RBC UA: NEGATIVE
UROBILINOGEN UA: NEGATIVE U/dL — AB

## 2018-07-08 MED ORDER — VALACYCLOVIR HCL 500 MG PO TABS: 500.0000 mg | ORAL_TABLET | Freq: Every day | ORAL | 4 refills | Status: DC

## 2018-07-08 MED ORDER — NITROFURANTOIN MONOHYD MACRO 100 MG PO CAPS: ORAL_CAPSULE | ORAL | 0 refills | Status: DC

## 2018-07-08 NOTE — Progress Notes (Signed)
68 y.o. [id number] Married  Caucasian Fe here for annual exam. Menopausal no symptoms, not sure if  vaginal dryness. Complaining of urinary frequency and pain with urination and urgency for the past 2 days and urinary odor. Recent sexual activity prior to occurrence. Uses sexual devices and some had stimulation with wife. Also has tried to increase water but developed severe leg cramps. Previous was drinking only Sprite. Stopped water intake and cramping stopped. Sees Dr. Corliss Skains for aex, labs cholesterol management. and Dr. Domingo Cocking for migraine management. Concerned she has had some visual changes with surroundings. No fainting or dizziness. No HSV outbreaks.  No other health issues today.  Patient's last menstrual period was 06/23/1990 (approximate).          Sexually active: Yes.    The current method of family planning is status post hysterectomy.    Exercising: Yes.    walking Smoker:  no  Review of Systems  Constitutional: Negative.   HENT: Negative.   Eyes: Negative.   Respiratory: Negative.   Cardiovascular: Negative.   Gastrointestinal: Negative.   Genitourinary:       Urinary odor  Musculoskeletal: Negative.   Skin:       Vaginal itching & burning  Neurological: Negative.   Endo/Heme/Allergies: Negative.   Psychiatric/Behavioral: Negative.     Health Maintenance: Pap:  12-30-01 neg History of Abnormal Pap: yes MMG:  04-28-18 category b density birads 1:neg Self Breast exams: yes Colonoscopy:  2018 f/u 26yrs BMD:   2014 normal TDaP:  2010 Shingles: 2019 Pneumonia: done maybe 40yrs ago Hep C and HIV: both neg 2016 Labs: poct urine- wbc1+,nitirite-positive   reports that she has never smoked. She has never used smokeless tobacco. She reports current alcohol use. She reports that she does not use drugs.  Past Medical History:  Diagnosis Date  . Abnormal Pap smear of cervix    in 20's  . Anxiety    claustrophobic  . Arthritis    "neck, hands" (09/02/2013)  . Basal cell  carcinoma of forehead 2008; 2014  . Dizziness last occurance 10-15-2014   on occasion, not related to activity  . DVT (deep venous thrombosis) ([place]) 06/28/2014   Dr [employer], right leg, behind knee  . Environmental allergies   . Exercise-induced asthma 30's  . GERD (gastroesophageal reflux disease)   . H/O hiatal hernia   . Herpes simplex 1976  . History of kidney stones   . Hyperlipidemia   . Migraine teens, returned in 40"s   "depends on the change in the weather; last one was 1 wk ago" (09/02/2013)  . Plantar fasciitis   . PONV (postoperative nausea and vomiting) 3/ 2015   ponv after gallblaffer  . Right ureteral stone   . Seasonal allergic rhinitis   . Tubular adenoma of colon 05/22/2017   per path report.     Past Surgical History:  Procedure Laterality Date  . ABDOMINAL HYSTERECTOMY  1992  . CESAREAN SECTION  1981  . CHOLECYSTECTOMY N/A 09/02/2013   Procedure: LAPAROSCOPIC CHOLECYSTECTOMY;  Surgeon: Harl Bowie, MD;  Location: [facility];  Service: General;  Laterality: N/A;  . CRYOTHERAPY     for abnormal pap smear in 20's  . CYSTOSCOPY W/ URETERAL STENT PLACEMENT  03/05/2012   Procedure: CYSTOSCOPY WITH RETROGRADE PYELOGRAM/URETERAL STENT PLACEMENT;  Surgeon: Hanley Ben, MD;  Location: [facility];  Service: Urology;  Laterality: Right;  . CYSTOSCOPY W/ URETERAL STENT PLACEMENT Right 01/19/2015   Procedure: CYSTOSCOPY WITH RETROGRADE PYELOGRAM/URETERAL  RIGHT STENT PLACEMENT;  Surgeon: Alexis Frock, MD;  Location: [facility];  Service: Urology;  Laterality: Right;  . CYSTOSCOPY WITH RETROGRADE PYELOGRAM, URETEROSCOPY AND STENT PLACEMENT Right 09/08/2014   Procedure: CYSTOSCOPY WITH RIGHT  RETROGRADE PYELOGRAM, URETEROSCOPY AND RIGHT STENT PLACEMENT;  Surgeon: Raynelle Bring, MD;  Location: [facility];  Service: Urology;  Laterality: Right;  . CYSTOSCOPY WITH RETROGRADE PYELOGRAM, URETEROSCOPY AND STENT PLACEMENT Right 10/19/2014   Procedure: CYSTOSCOPY WITH  Bilateral RETROGRADE PYELOGRAM/RIGHT URETEROSCOPY,RIGHT JJ STENT;  Surgeon: Carolan Clines, MD;  Location: [facility];  Service: Urology;  Laterality: Right;  . CYSTOSCOPY/RETROGRADE/URETEROSCOPY  11/18/2011   Procedure: CYSTOSCOPY/RETROGRADE/URETEROSCOPY;  Surgeon: Ailene Rud, MD;  Location: [facility];  Service: Urology;  Laterality: Right;  . CYSTOSCOPY/RETROGRADE/URETEROSCOPY/STONE EXTRACTION WITH BASKET  10/21/2011   Procedure: CYSTOSCOPY/RETROGRADE/URETEROSCOPY/STONE EXTRACTION WITH BASKET;  Surgeon: Ailene Rud, MD;  Location[facility];  Service: Urology;  Laterality: Right;  . CYSTOSCOPY/RETROGRADE/URETEROSCOPY/STONE EXTRACTION WITH BASKET Right 09/10/2012   Procedure: RIGHT URETEROSCOPY/STONE EXTRACTION/LASER LITHOTRIPSY/POSSIBLE DOUBLE J STENT PLACEMENT;  Surgeon: Ailene Rud, MD;  Location: [facility];  Service: Urology;  Laterality: Right;  . MOHS SURGERY  2008; 2014  . PLANTAR FASCIA SURGERY  05/2016  . POSTERIOR PINNACLE MESH SACROSPINOUS REPAIR  11-22-2010   RECURRENT ENTEROCELE  . RIGHT URETEROSCOPIC STONE EXTRACTION W/ STENT PLACEMENT  08-12-2011   x2 stents  . ROBOTICALLY ASSISTED LAPAROSCOPIC URETERAL RE-IMPLANTATION Right 01/19/2015   Procedure: ROBOTICALLY ASSISTED LAPAROSCOPIC URETERAL RE-IMPLANTATION RIGHT;  Surgeon: Alexis Frock, MD;  Location: [facility];  Service: Urology;  Laterality: Right;  . TUBAL LIGATION  1991  . VAULT SUSPENSION (DERMAL GRAFT)/ ENTEROCELE AND RECTOCELE REPAIR  12-05-2005   VAULT PROLAPSE/ ENTERCELE/ RECTOCELE    Current Outpatient Medications  Medication Sig Dispense Refill  . atorvastatin (LIPITOR) 20 MG tablet TAKE 1 TABLET ONCE DAILY. 90 tablet 1  . chlordiazePOXIDE (LIBRIUM) 10 MG capsule Take 10 mg by mouth at bedtime.    Marland Kitchen dexlansoprazole (DEXILANT) 60 MG capsule Take 60 mg by mouth every morning.     . diclofenac sodium (VOLTAREN) 1 % GEL Apply 2 g topically 4 (four) times daily.    Marland Kitchen levofloxacin  (LEVAQUIN) 500 MG tablet Take 1 tablet (500 mg total) by mouth daily. (Patient not taking: Reported on 06/10/2018) 7 tablet 0  . METHOCARBAMOL PO Take 500 mg by mouth daily as needed (spasms).     . mometasone (ASMANEX, 60 METERED DOSES,) 220 MCG/INH inhaler USE 1 PUFF TWICE A DAY AS DIRECTED 1 Inhaler 11  . ondansetron (ZOFRAN ODT) 4 MG disintegrating tablet Take 1 tablet (4 mg total) by mouth every 8 (eight) hours as needed for nausea or vomiting. (Patient not taking: Reported on 06/10/2018) 20 tablet 0  . oxyCODONE-acetaminophen (PERCOCET/ROXICET) 5-325 MG tablet Take 1 tablet by mouth every 4 (four) hours as needed for severe pain. (Patient not taking: Reported on 06/10/2018) 10 tablet 0  . PROAIR HFA 108 (90 Base) MCG/ACT inhaler INHALE 2 PUFFS INTO THE LUNGS EVERY 6 HOURS AS NEEDED FOR WHEEZING/ SHORTNESS OF BREATH. 8.5 g 0  . rizatriptan (MAXALT) 10 MG tablet Take 10 mg by mouth as needed for migraine. May repeat in 2 hours if needed    . tamsulosin (FLOMAX) 0.4 MG CAPS capsule Take 1 capsule (0.4 mg total) by mouth daily. (Patient not taking: Reported on 06/10/2018) 10 capsule 0  . valACYclovir (VALTREX) 500 MG tablet Take 1 tablet (500 mg total) by mouth daily. 90 tablet 4  . zonisamide (ZONEGRAN) 100 MG  capsule Take 200 mg by mouth at bedtime.      No current facility-administered medications for this visit.     Family History  Problem Relation Age of Onset  . Cancer Father     ROS:  Pertinent items are noted in HPI.  Otherwise, a comprehensive ROS was negative.  Exam:   LMP 06/23/1990 (Approximate)    Ht Readings from Last 3 Encounters:  01/01/18 5' (1.524 m)  10/23/17 5\' 2"  (1.575 m)  10/20/17 5\' 2"  (1.575 m)    General appearance: alert, cooperative and appears stated age Head: Normocephalic, without obvious abnormality, atraumatic Neck: no adenopathy, supple, symmetrical, trachea midline and thyroid normal to inspection and palpation Lungs: clear to auscultation  bilaterally CVAT bilateral negative Breasts: normal appearance, no masses or tenderness, No nipple retraction or dimpling, No nipple discharge or bleeding, No axillary or supraclavicular adenopathy Heart: regular rate and rhythm Abdomen: soft, non-tender; no masses,  no organomegaly, positive suprapubic  Extremities: extremities normal, atraumatic, no cyanosis or edema Skin: Skin color, texture, turgor normal. No rashes or lesions Lymph nodes: Cervical, supraclavicular, and axillary nodes normal. No abnormal inguinal nodes palpated Neurologic: Grossly normal   Pelvic: External genitalia:  no lesions, atrophic appearance              Urethra:  normal appearing urethra with no masses, tenderness or lesions  Urethral meatus prominent, slightly red and tender  Bladder sensitive to touch              Bartholin's and Skene's: normal                 Vagina: normal appearing vagina with normal color and discharge, no lesions              Cervix: absent              Pap taken: No. Bimanual Exam:  Uterus:  uterus absent              Adnexa: no mass, fullness, tenderness               Rectovaginal: Confirms               Anus:  normal sphincter tone, no lesions  Chaperone present: yes  A:  Well Woman with normal exam  Post menopausal  s/pTAH  UTI  Atrophic vaginitis  Migraine headache history with Neurology management with concerns  PCP management of cholesterol, prn  P:   Reviewed health and wellness pertinent to exam  Discussed UTI finding and suspect related to vaginal dryness, sexual activity and need for water and other beverages beside soda. Discussed need to treat and increase water intake with some soda use to stop prevent leg cramps. Be sure to empty bladder before and after sexual activity. Consider post coital medication after culture in. Rx Macrobid 100 mg bid x 7 see order with instructions Lab: Urine micro, culture  Discussed atrophic vaginitis finding and using coconut oil  or Light virgin Olive oil for moisture twice weekly and for sexual activity to see if this stops UTI occurrence.  Stressed discussion with PCP regarding concerns with visual perception and medication use for migraines. Warning signs given.  Pap smear: no   counseled on breast self exam, mammography screening, feminine hygiene, adequate intake of calcium and vitamin D, diet and exercise  return annually or prn  An After Visit Summary was printed and given to the patient.

## 2018-07-08 NOTE — Patient Instructions (Signed)

## 2018-07-09 LAB — URINALYSIS, MICROSCOPIC ONLY: Casts: NONE SEEN /lpf

## 2018-07-11 ENCOUNTER — Other Ambulatory Visit: Payer: Self-pay | Admitting: Certified Nurse Midwife

## 2018-07-11 DIAGNOSIS — N3001 Acute cystitis with hematuria: Secondary | ICD-10-CM

## 2018-07-11 LAB — URINE CULTURE

## 2018-07-11 MED ORDER — SULFAMETHOXAZOLE-TRIMETHOPRIM 800-160 MG PO TABS: 1.0000 | ORAL_TABLET | Freq: Two times a day (BID) | ORAL | 0 refills | Status: DC

## 2018-09-04 ENCOUNTER — Other Ambulatory Visit: Payer: Self-pay | Admitting: Family Medicine

## 2018-09-04 DIAGNOSIS — J4530 Mild persistent asthma, uncomplicated: Secondary | ICD-10-CM

## 2018-09-06 NOTE — Telephone Encounter (Signed)
[facility] is requesting to fill pt pro air. Please advise [facility]

## 2018-11-17 ENCOUNTER — Telehealth: Payer: Self-pay | Admitting: Family Medicine

## 2018-11-17 NOTE — Telephone Encounter (Signed)
Recv'd fax from [facility] that [employer] no longer covering Asmanex, alternatives are Arnuity Ellipta or Flovent diskus or HFA or Qvar redihaler.  Do you want to switch to either of these?

## 2018-11-19 ENCOUNTER — Telehealth: Payer: Self-pay | Admitting: Family Medicine

## 2018-11-19 MED ORDER — BECLOMETHASONE DIPROP HFA 40 MCG/ACT IN AERB: 2.0000 | INHALATION_SPRAY | Freq: Two times a day (BID) | RESPIRATORY_TRACT | 3 refills | Status: DC

## 2018-11-19 NOTE — Telephone Encounter (Signed)
Pharmacy called and Asmanex is not covered by her insurance and is requiring a prior auth, which will take several days.  She is almost out and we have no samples. Pharmacy is requesting med be changed to something her ins will cover. Ins will cover Arnuity, Flovent Diskus, Pulmacort and Qvar. Please advise [facility].

## 2018-11-19 NOTE — Telephone Encounter (Signed)
done

## 2018-12-06 ENCOUNTER — Other Ambulatory Visit: Payer: Self-pay | Admitting: Family Medicine

## 2018-12-14 ENCOUNTER — Ambulatory Visit (INDEPENDENT_AMBULATORY_CARE_PROVIDER_SITE_OTHER): Payer: BC Managed Care – PPO | Admitting: Family Medicine

## 2018-12-14 ENCOUNTER — Encounter: Payer: Self-pay | Admitting: Family Medicine

## 2018-12-14 ENCOUNTER — Other Ambulatory Visit: Payer: Self-pay

## 2018-12-14 VITALS — BP 122/86 | HR 75 | Temp 98.7°F | Wt 160.2 lb

## 2018-12-14 DIAGNOSIS — R252 Cramp and spasm: Secondary | ICD-10-CM

## 2018-12-14 DIAGNOSIS — E785 Hyperlipidemia, unspecified: Secondary | ICD-10-CM | POA: Diagnosis not present

## 2018-12-14 DIAGNOSIS — N951 Menopausal and female climacteric states: Secondary | ICD-10-CM

## 2018-12-14 DIAGNOSIS — J4530 Mild persistent asthma, uncomplicated: Secondary | ICD-10-CM

## 2018-12-14 DIAGNOSIS — D126 Benign neoplasm of colon, unspecified: Secondary | ICD-10-CM | POA: Diagnosis not present

## 2018-12-14 DIAGNOSIS — K219 Gastro-esophageal reflux disease without esophagitis: Secondary | ICD-10-CM

## 2018-12-14 DIAGNOSIS — Z23 Encounter for immunization: Secondary | ICD-10-CM | POA: Diagnosis not present

## 2018-12-14 DIAGNOSIS — K5790 Diverticulosis of intestine, part unspecified, without perforation or abscess without bleeding: Secondary | ICD-10-CM

## 2018-12-14 DIAGNOSIS — G43909 Migraine, unspecified, not intractable, without status migrainosus: Secondary | ICD-10-CM | POA: Insufficient documentation

## 2018-12-14 DIAGNOSIS — G43109 Migraine with aura, not intractable, without status migrainosus: Secondary | ICD-10-CM

## 2018-12-14 MED ORDER — QVAR REDIHALER 40 MCG/ACT IN AERB: 2.0000 | INHALATION_SPRAY | Freq: Two times a day (BID) | RESPIRATORY_TRACT | 3 refills | Status: DC

## 2018-12-14 MED ORDER — DEXLANSOPRAZOLE 60 MG PO CPDR: 60.0000 mg | DELAYED_RELEASE_CAPSULE | Freq: Every morning | ORAL | 3 refills | Status: DC

## 2018-12-14 MED ORDER — ALBUTEROL SULFATE HFA 108 (90 BASE) MCG/ACT IN AERS: INHALATION_SPRAY | RESPIRATORY_TRACT | 0 refills | Status: DC

## 2018-12-14 MED ORDER — ATORVASTATIN CALCIUM 20 MG PO TABS: 20.0000 mg | ORAL_TABLET | Freq: Every day | ORAL | 3 refills | Status: DC

## 2018-12-14 NOTE — Progress Notes (Signed)
   Subjective:    Patient ID: Carol [name], female    DOB: 07/06/1950, 68 y.o.   MRN: [id number]  HPI She is here for medication check.  She does have underlying asthma and is doing well on Qvar as well as using her albuterol.  She uses Dexilant for her reflux and hiatus hernia type symptoms.  He seems to be under good control.  She states that her hot flashes are manageable.  She continues on Lipitor.  She recently saw her headache specialist and will switch to a different migraine medicine.  She is not sure which when she was using.  She has had a colonoscopy and apparently it was negative and does not need follow-up for 10 years.  She states that she does plan to retire in January but is not sure what she plans to do.   Review of Systems     Objective:   Physical Exam Alert and in no distress otherwise not examined       Assessment & Plan:  Gastroesophageal reflux disease, esophagitis presence not specified - Plan: CBC with Differential/Platelet, Comprehensive metabolic panel, dexlansoprazole (DEXILANT) 60 MG capsule,   Hyperlipidemia with target LDL less than 100 - Plan: Lipid panel, atorvastatin (LIPITOR) 20 MG tablet,   Tubular adenoma of colon - Plan: Repeat colonoscopy in 2028  Nocturnal muscle cramps - Plan: CBC with Differential/Platelet, Comprehensive metabolic panel, no therapy at this time  Hot flashes, menopausal - Plan: Continue to treat with present meds  Diverticulosis - migraine with aura and without status migrainosus, not intractable - Plan: She is to let me know the medication that she is taking  Mild asthma without complication - Plan: albuterol (PROAIR HFA) 108 (90 Base) MCG/ACT inhaler, beclomethasone (QVAR REDIHALER) 40 MCG/ACT inhaler,   Need for Tdap vaccination - Plan: Tdap vaccine greater than or equal to 7yo IM,   Need for vaccination against Streptococcus pneumoniae - Plan: Pneumococcal conjugate vaccine 13-valent,

## 2018-12-15 LAB — CBC WITH DIFFERENTIAL/PLATELET
Basophils Absolute: 0.1 10*3/uL (ref 0.0–0.2)
Basos: 1 %
EOS (ABSOLUTE): 0.2 10*3/uL (ref 0.0–0.4)
Eos: 3 %
Hematocrit: 39.7 % (ref 34.0–46.6)
Hemoglobin: 13.9 g/dL (ref 11.1–15.9)
Immature Grans (Abs): 0 10*3/uL (ref 0.0–0.1)
Immature Granulocytes: 0 %
Lymphocytes Absolute: 2 10*3/uL (ref 0.7–3.1)
Lymphs: 32 %
MCH: 29.8 pg (ref 26.6–33.0)
MCHC: 35 g/dL (ref 31.5–35.7)
MCV: 85 fL (ref 79–97)
Monocytes Absolute: 0.4 10*3/uL (ref 0.1–0.9)
Monocytes: 7 %
Neutrophils Absolute: 3.4 10*3/uL (ref 1.4–7.0)
Neutrophils: 57 %
Platelets: 213 10*3/uL (ref 150–450)
RBC: 4.66 x10E6/uL (ref 3.77–5.28)
RDW: 13 % (ref 11.7–15.4)
WBC: 6.1 10*3/uL (ref 3.4–10.8)

## 2018-12-15 LAB — LIPID PANEL
Chol/HDL Ratio: 2.8 ratio (ref 0.0–4.4)
Cholesterol, Total: 165 mg/dL (ref 100–199)
HDL: 58 mg/dL (ref >39)
LDL Calculated: 80 mg/dL (ref 0–99)
Triglycerides: 136 mg/dL (ref 0–149)
VLDL Cholesterol Cal: 27 mg/dL (ref 5–40)

## 2018-12-15 LAB — COMPREHENSIVE METABOLIC PANEL
ALT: 19 IU/L (ref 0–32)
AST: 20 IU/L (ref 0–40)
Albumin/Globulin Ratio: 2.1 (ref 1.2–2.2)
Albumin: 4.5 g/dL (ref 3.8–4.8)
Alkaline Phosphatase: 87 IU/L (ref 39–117)
BUN/Creatinine Ratio: 20 (ref 12–28)
BUN: 17 mg/dL (ref 8–27)
Bilirubin Total: 0.4 mg/dL (ref 0.0–1.2)
CO2: 22 mmol/L (ref 20–29)
Calcium: 9.4 mg/dL (ref 8.7–10.3)
Chloride: 107 mmol/L — ABNORMAL HIGH (ref 96–106)
Creatinine, Ser: 0.86 mg/dL (ref 0.57–1.00)
GFR calc Af Amer: 82 mL/min/{1.73_m2} (ref >59)
GFR calc non Af Amer: 71 mL/min/{1.73_m2} (ref >59)
Globulin, Total: 2.1 g/dL (ref 1.5–4.5)
Glucose: 98 mg/dL (ref 65–99)
Potassium: 4.7 mmol/L (ref 3.5–5.2)
Sodium: 140 mmol/L (ref 134–144)
Total Protein: 6.6 g/dL (ref 6.0–8.5)

## 2019-01-20 ENCOUNTER — Ambulatory Visit (INDEPENDENT_AMBULATORY_CARE_PROVIDER_SITE_OTHER): Payer: BC Managed Care – PPO | Admitting: Family Medicine

## 2019-01-20 ENCOUNTER — Other Ambulatory Visit: Payer: Self-pay

## 2019-01-20 ENCOUNTER — Encounter: Payer: Self-pay | Admitting: Family Medicine

## 2019-01-20 VITALS — BP 106/76 | HR 62 | Temp 97.9°F | Wt 161.2 lb

## 2019-01-20 DIAGNOSIS — K219 Gastro-esophageal reflux disease without esophagitis: Secondary | ICD-10-CM

## 2019-01-20 NOTE — Progress Notes (Addendum)
   Acute Office Visit  Subjective:    Patient ID: Carol [name], female    DOB: September 13, 1950, 68 y.o.   MRN: [id number]  No chief complaint on file.   HPI Patient is in today for two weeks of upper abd pain. Started immediately after eating. Noticed outpouching of abdominal scar. Described as constant burning, bloating, increased pressure in the upper abdomen with sharp pain on the left side occuring perodically. Pain increases with eating, sitting up while wearing a bra, and deep breaths. Pt hasn't noticed certain types of food that exacerbate symptoms any more than others. No diarrhea. No change in bowel movement schedule or consistency. No blood or melena in stool.  History of cholecystectomy in 2015. History of GERD and stomach spasms. Her gastroenterologist, Dr. Collene Mares, placed her on [place] and an unknown antispasmodic to be taken every night. A month ago, Dr. Man took her off the antispasmodic due to it no longer being manufactured. She has a follow-up appointment  next Tuesday.   Past Medical History:  Diagnosis Date  . Abnormal Pap smear of cervix    in 20's  . Anxiety    claustrophobic  . Arthritis    "neck, hands" (09/02/2013)  . Basal cell carcinoma of forehead 2008; 2014  . Dizziness last occurance 10-15-2014   on occasion, not related to activity  . DVT (deep venous thrombosis) ([place]) 06/28/2014   Dr [employer], right leg, behind knee  . Environmental allergies   . Exercise-induced asthma 30's  . GERD (gastroesophageal reflux disease)   . H/O hiatal hernia   . Herpes simplex 1976  . History of kidney stones   . Hyperlipidemia   . Migraine teens, returned in 40"s   "depends on the change in the weather; last one was 1 wk ago" (09/02/2013)  . Plantar fasciitis   . PONV (postoperative nausea and vomiting) 3/ 2015   ponv after gallblaffer  . Right ureteral stone   . Seasonal allergic rhinitis   . Tubular adenoma of colon 05/22/2017   per path report.       Objective:     Physical Exam  Abdominal: Soft. She exhibits distension. There is abdominal tenderness. There is no rebound and no guarding.  More tender on the left side.normal bowel sounds  LMP 06/23/1990 (Approximate)  Wt Readings from Last 3 Encounters:  12/14/18 160 lb 3.2 oz (72.7 kg)  07/08/18 159 lb (72.1 kg)  06/10/18 160 lb 9.6 oz (72.8 kg)       Assessment & Plan:   Abdominal Pain - Continue dexilant as indicated. Follow-up with Dr. Man on next week as planned.  I explained that I was unsure what was exactly causing her symptoms.  Recommend she keep track of foods or anything in particular that would make the pain better or worse.  Ozella Almond, Medical Student  Patient seen and salmon in conjunction with Gregary Signs

## 2019-02-04 LAB — HM COLONOSCOPY

## 2019-02-11 ENCOUNTER — Telehealth: Payer: Self-pay | Admitting: Family Medicine

## 2019-02-11 ENCOUNTER — Encounter: Payer: Self-pay | Admitting: Family Medicine

## 2019-02-11 NOTE — Telephone Encounter (Signed)
Letter typed and emailed °

## 2019-02-11 NOTE — Telephone Encounter (Signed)
ok 

## 2019-02-11 NOTE — Telephone Encounter (Signed)
Pt called and wanted to know if she can get a note for her job stating that she is high risk and needs to continue to work from home instead of going into the office. Pt stated that the letter can be emailed to the address that's on file

## 2019-03-15 ENCOUNTER — Other Ambulatory Visit: Payer: Self-pay | Admitting: Family Medicine

## 2019-03-15 DIAGNOSIS — Z1231 Encounter for screening mammogram for malignant neoplasm of breast: Secondary | ICD-10-CM

## 2019-04-20 ENCOUNTER — Encounter: Payer: Self-pay | Admitting: Family Medicine

## 2019-04-21 MED ORDER — PANTOPRAZOLE SODIUM 40 MG PO TBEC: 40.0000 mg | DELAYED_RELEASE_TABLET | Freq: Every day | ORAL | 3 refills | Status: DC

## 2019-04-22 MED ORDER — ASMANEX (60 METERED DOSES) 220 MCG/INH IN AEPB: 2.0000 | INHALATION_SPRAY | Freq: Every day | RESPIRATORY_TRACT | 12 refills | Status: DC

## 2019-04-22 NOTE — Addendum Note (Signed)
Addended by: Denita Lung on: 04/22/2019 04:02 PM   Modules accepted: Orders

## 2019-04-25 ENCOUNTER — Other Ambulatory Visit: Payer: Self-pay | Admitting: Family Medicine

## 2019-04-25 MED ORDER — ARNUITY ELLIPTA 100 MCG/ACT IN AEPB: 1.0000 | INHALATION_SPRAY | Freq: Every day | RESPIRATORY_TRACT | 1 refills | Status: DC

## 2019-04-25 NOTE — Progress Notes (Signed)
Her insurance would not cover Asmanex and I will therefore call in [place]

## 2019-05-02 ENCOUNTER — Ambulatory Visit
Admission: RE | Admit: 2019-05-02 | Discharge: 2019-05-02 | Disposition: A | Discharge status: Home / Self Care | Payer: BC Managed Care – PPO | Source: Ambulatory Visit | Attending: Family Medicine | Admitting: Family Medicine

## 2019-05-02 ENCOUNTER — Other Ambulatory Visit: Payer: Self-pay

## 2019-05-02 DIAGNOSIS — Z1231 Encounter for screening mammogram for malignant neoplasm of breast: Secondary | ICD-10-CM

## 2019-05-24 ENCOUNTER — Telehealth: Payer: Self-pay

## 2019-05-25 NOTE — Telephone Encounter (Signed)
Opened in error.   Will close encounter.  

## 2019-06-13 ENCOUNTER — Encounter: Payer: Self-pay | Admitting: Family Medicine

## 2019-06-13 ENCOUNTER — Ambulatory Visit (INDEPENDENT_AMBULATORY_CARE_PROVIDER_SITE_OTHER): Payer: BC Managed Care – PPO | Admitting: Family Medicine

## 2019-06-13 ENCOUNTER — Other Ambulatory Visit: Payer: Self-pay

## 2019-06-13 VITALS — BP 124/76 | HR 74 | Temp 98.4°F | Wt 171.4 lb

## 2019-06-13 DIAGNOSIS — M2242 Chondromalacia patellae, left knee: Secondary | ICD-10-CM

## 2019-06-13 DIAGNOSIS — M654 Radial styloid tenosynovitis [de Quervain]: Secondary | ICD-10-CM | POA: Diagnosis not present

## 2019-06-13 NOTE — Progress Notes (Signed)
   Subjective:    Patient ID: Carol [name], female    DOB: 11-03-1950, 68 y.o.   MRN: [id number]  HPI She is here for evaluation of 2 issues.  She complains of a several month history of left wrist pain.  She was seen by Dr. Amil Amen and recommended to wear a brace.  She has been wearing a brace but continues have difficulty.  She complains of pain to the base of the thumb with practically any activity and its interfering with her ADLs.  No history of injury or overuse. She also complains of left knee pain especially when she goes up and down steps and also will note this when she sits for a long period of time.  The pain will go away when she extends her knee.  No popping, locking or grinding.   Review of Systems     Objective:   Physical Exam Pain on palpation at the base of the left thumb.  Wynn Maudlin test is positive.  Pain on motion of the wrist as well.  Questionable crepitus was noted. Left knee exam shows no effusion.  Compression test was uncomfortable.  No joint line tenderness.  Medial and lateral collateral ligaments intact.  Negative anterior drawer and McMurray's testing.       Assessment & Plan:  De Quervain's tenosynovitis, left - Plan: Ambulatory referral to Orthopedic Surgery  Chondromalacia, patella, left Terminal extension exercises demonstrated and instructions given. Do terminal extension exercises.  3 sets of 10 ,3 times a day for the next 3 weeks.  She will call if continued difficulty.

## 2019-06-13 NOTE — Patient Instructions (Signed)
Do terminal extension exercises.  3 sets of 10 ,3 times a day for the next 3 weeks.

## 2019-06-15 ENCOUNTER — Encounter: Payer: Self-pay | Admitting: Family Medicine

## 2019-06-23 ENCOUNTER — Other Ambulatory Visit: Payer: Self-pay | Admitting: Internal Medicine

## 2019-07-07 ENCOUNTER — Other Ambulatory Visit: Payer: Self-pay

## 2019-07-07 ENCOUNTER — Ambulatory Visit: Payer: Self-pay

## 2019-07-07 ENCOUNTER — Encounter: Payer: Self-pay | Admitting: Orthopedic Surgery

## 2019-07-07 ENCOUNTER — Ambulatory Visit (INDEPENDENT_AMBULATORY_CARE_PROVIDER_SITE_OTHER): Payer: BC Managed Care – PPO | Admitting: Orthopedic Surgery

## 2019-07-07 DIAGNOSIS — M79642 Pain in left hand: Secondary | ICD-10-CM | POA: Diagnosis not present

## 2019-07-07 DIAGNOSIS — M1812 Unilateral primary osteoarthritis of first carpometacarpal joint, left hand: Secondary | ICD-10-CM | POA: Diagnosis not present

## 2019-07-07 MED ORDER — MELOXICAM 15 MG PO TABS: ORAL_TABLET | ORAL | 0 refills | Status: DC

## 2019-07-07 NOTE — Progress Notes (Signed)
Office Visit Note   Patient: Carol [name]           Date of Birth: 1950/11/23           MRN: [id number] Visit Date: 07/07/2019 Requested by: Denita Lung, MD [place],  [place] [id number] PCP: Denita Lung, MD  Subjective: Chief Complaint  Patient presents with  . Left Hand - Pain    HPI:   Carol [name] is a patient with left thumb and wrist pain.  She has been using a splint.  She reports states that the pain is getting worse.  She reports swelling and weakness as well as decreased strength and grip.  Sometimes the pain will radiate up into the shoulder.  She is right-hand dominant.  She has tried a topical and uses a thumb splint.  She is retired as of December.    ROS: All systems reviewed are negative as they relate to the chief complaint within the history of present illness.  Patient denies  fevers or chills.   Assessment & Plan: Visit Diagnoses:  1. 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  Radiographs show that the arthritis at the [facility] joint is mild.  Plan is Mobic 15 mg a day for 2 weeks then as needed with 4-week return to decide for or against injections at that time.  The splint is optional at this time.  Follow-Up Instructions: No follow-ups on file.   Orders:  Orders Placed This Encounter  Procedures  . XR Hand Complete Left   Meds ordered this encounter  Medications  . meloxicam (MOBIC) 15 MG tablet    Sig: 1 po q d x 2 weeks then prn    Dispense:  30 tablet    Refill:  0      Procedures: No procedures performed   Clinical Data: No additional findings.  Objective: Vital Signs: LMP 06/23/1990 (Approximate)   Physical Exam:   Constitutional: Patient appears well-developed HEENT:  Head: Normocephalic Eyes:EOM are normal Neck: Normal range of motion Cardiovascular: Normal rate Pulmonary/chest: Effort normal Neurologic: Patient is alert Skin: Skin is warm Psychiatric:  Patient has normal mood and affect    Ortho Exam: Ortho exam demonstrates positive grind test for [facility] arthritis on the left.  EPL FPL function is intact.  Collateral ligaments are stable at the IP and MCP joints.  Patient does have positive Finkelstein's and positive tenderness at the radial styloid.  Wrist range of motion otherwise is full with no radial or ulnar-sided joint line tenderness.  Specialty Comments:  No specialty comments available.  Imaging: XR Hand Complete Left  Result Date: 07/07/2019 AP lateral oblique left hand reviewed.  No acute fractures present.  Very mild intercarpal degenerative changes noted.  Radiocarpal alignment normal.    PMFS History: Patient Active Problem List   Diagnosis Date Noted  . Migraine headache with aura 12/14/2018  . History of DVT of lower extremity 10/23/2017  . Nocturnal muscle cramps 10/23/2017  . Tubular adenoma of colon 05/21/2016  . S/P laparoscopic cholecystectomy March 2015 09/03/2013  . Hyperlipidemia with target LDL less than 100 06/27/2011  . Herpes 12/16/2010  . GERD (gastroesophageal reflux disease) 12/16/2010  . HH (hiatus hernia) 12/16/2010  . Hot flashes, menopausal 12/16/2010  . Diverticulosis 12/16/2010  . Asthma 12/16/2010  . 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 Abnormal Pap smear of  cervix    in 20's  . Anxiety    claustrophobic  . Arthritis    "neck, hands" (09/02/2013)  . Basal cell carcinoma of forehead 2008; 2014  . Dizziness last occurance 10-15-2014   on occasion, not related to activity  . DVT (deep venous thrombosis) ([facility]) 06/28/2014   Dr [employer], right leg, behind knee  . Environmental allergies   . Exercise-induced asthma 30's  . GERD (gastroesophageal reflux disease)   . H/O hiatal hernia   . Herpes simplex 1976  . History of kidney stones   . Hyperlipidemia   . Migraine teens, returned in 40"s   "depends on the change in the weather; last  one was 1 wk ago" (09/02/2013)  . Plantar fasciitis   . PONV (postoperative nausea and vomiting) 3/ 2015   ponv after gallblaffer  . Right ureteral stone   . Seasonal allergic rhinitis   . Tubular adenoma of colon 05/22/2017   per path report.     Family History  Problem Relation Age of Onset  . Cancer Father     Past Surgical History:  Procedure Laterality Date  . ABDOMINAL HYSTERECTOMY  1992  . CESAREAN SECTION  1981  . CHOLECYSTECTOMY N/A 09/02/2013   Procedure: LAPAROSCOPIC CHOLECYSTECTOMY;  Surgeon: Harl Bowie, MD;  Location: [facility];  Service: General;  Laterality: N/A;  . CRYOTHERAPY     for abnormal pap smear in 20's  . CYSTOSCOPY W/ URETERAL STENT PLACEMENT  03/05/2012   Procedure: CYSTOSCOPY WITH RETROGRADE PYELOGRAM/URETERAL STENT PLACEMENT;  Surgeon: Hanley Ben, MD;  Location: [facility];  Service: Urology;  Laterality: Right;  . CYSTOSCOPY W/ URETERAL STENT PLACEMENT Right 01/19/2015   Procedure: CYSTOSCOPY WITH RETROGRADE PYELOGRAM/URETERAL  RIGHT STENT PLACEMENT;  Surgeon: Alexis Frock, MD;  Location: [facility];  Service: Urology;  Laterality: Right;  . CYSTOSCOPY WITH RETROGRADE PYELOGRAM, URETEROSCOPY AND STENT PLACEMENT Right 09/08/2014   Procedure: CYSTOSCOPY WITH RIGHT  RETROGRADE PYELOGRAM, URETEROSCOPY AND RIGHT STENT PLACEMENT;  Surgeon: Raynelle Bring, MD;  Location: [facility];  Service: Urology;  Laterality: Right;  . CYSTOSCOPY WITH RETROGRADE PYELOGRAM, URETEROSCOPY AND STENT PLACEMENT Right 10/19/2014   Procedure: CYSTOSCOPY WITH Bilateral RETROGRADE PYELOGRAM/RIGHT URETEROSCOPY,RIGHT JJ STENT;  Surgeon: Carolan Clines, MD;  Location: [facility];  Service: Urology;  Laterality: Right;  . CYSTOSCOPY/RETROGRADE/URETEROSCOPY  11/18/2011   Procedure: CYSTOSCOPY/RETROGRADE/URETEROSCOPY;  Surgeon: Ailene Rud, MD;  Location: [facility];  Service: Urology;  Laterality: Right;  . CYSTOSCOPY/RETROGRADE/URETEROSCOPY/STONE EXTRACTION WITH BASKET   10/21/2011   Procedure: CYSTOSCOPY/RETROGRADE/URETEROSCOPY/STONE EXTRACTION WITH BASKET;  Surgeon: Ailene Rud, MD;  Location: [facility];  Service: Urology;  Laterality: Right;  . CYSTOSCOPY/RETROGRADE/URETEROSCOPY/STONE EXTRACTION WITH BASKET Right 09/10/2012   Procedure: RIGHT URETEROSCOPY/STONE EXTRACTION/LASER LITHOTRIPSY/POSSIBLE DOUBLE J STENT PLACEMENT;  Surgeon: Ailene Rud, MD;  Location: [facility];  Service: Urology;  Laterality: Right;  . MOHS SURGERY  2008; 2014  . PLANTAR FASCIA SURGERY  05/2016  . POSTERIOR PINNACLE MESH SACROSPINOUS REPAIR  11-22-2010   RECURRENT ENTEROCELE  . RIGHT URETEROSCOPIC STONE EXTRACTION W/ STENT PLACEMENT  08-12-2011   x2 stents  . ROBOTICALLY ASSISTED LAPAROSCOPIC URETERAL RE-IMPLANTATION Right 01/19/2015   Procedure: ROBOTICALLY ASSISTED LAPAROSCOPIC URETERAL RE-IMPLANTATION RIGHT;  Surgeon: Alexis Frock, MD;  Location: [facility];  Service: Urology;  Laterality: Right;  . TUBAL LIGATION  1991  . VAULT SUSPENSION (DERMAL GRAFT)/ ENTEROCELE AND RECTOCELE REPAIR  12-05-2005   VAULT PROLAPSE/ ENTERCELE/ RECTOCELE   Social History   Occupational History  . Not on file  Tobacco Use  . Smoking status: Never Smoker  . Smokeless tobacco: Never Used  Substance and Sexual Activity  . Alcohol use: Not Currently  . Drug use: No  . Sexual activity: Yes    Partners: Female    Birth control/protection: Surgical    Comment: hysterectomy

## 2019-07-11 ENCOUNTER — Other Ambulatory Visit (HOSPITAL_COMMUNITY)
Admission: RE | Admit: 2019-07-11 | Discharge: 2019-07-11 | Disposition: A | Discharge status: Home / Self Care | Payer: PPO | Source: Ambulatory Visit | Attending: Certified Nurse Midwife | Admitting: Certified Nurse Midwife

## 2019-07-11 ENCOUNTER — Encounter: Payer: Self-pay | Admitting: Certified Nurse Midwife

## 2019-07-11 ENCOUNTER — Ambulatory Visit (INDEPENDENT_AMBULATORY_CARE_PROVIDER_SITE_OTHER): Payer: PPO | Admitting: Certified Nurse Midwife

## 2019-07-11 ENCOUNTER — Other Ambulatory Visit: Payer: Self-pay

## 2019-07-11 VITALS — BP 110/80 | HR 68 | Temp 97.2°F | Resp 16 | Ht 60.5 in | Wt 172.0 lb

## 2019-07-11 DIAGNOSIS — Z124 Encounter for screening for malignant neoplasm of cervix: Secondary | ICD-10-CM | POA: Insufficient documentation

## 2019-07-11 DIAGNOSIS — Z01411 Encounter for gynecological examination (general) (routine) with abnormal findings: Secondary | ICD-10-CM | POA: Diagnosis not present

## 2019-07-11 DIAGNOSIS — N952 Postmenopausal atrophic vaginitis: Secondary | ICD-10-CM | POA: Diagnosis not present

## 2019-07-11 DIAGNOSIS — Z1151 Encounter for screening for human papillomavirus (HPV): Secondary | ICD-10-CM | POA: Diagnosis not present

## 2019-07-11 DIAGNOSIS — Z0000 Encounter for general adult medical examination without abnormal findings: Secondary | ICD-10-CM | POA: Diagnosis not present

## 2019-07-11 DIAGNOSIS — Z8619 Personal history of other infectious and parasitic diseases: Secondary | ICD-10-CM | POA: Diagnosis not present

## 2019-07-11 DIAGNOSIS — N390 Urinary tract infection, site not specified: Secondary | ICD-10-CM | POA: Diagnosis not present

## 2019-07-11 LAB — POCT URINALYSIS DIPSTICK
Bilirubin, UA: NEGATIVE
Blood, UA: NEGATIVE
Glucose, UA: NEGATIVE
Ketones, UA: NEGATIVE
Nitrite, UA: POSITIVE
Protein, UA: NEGATIVE
Urobilinogen, UA: NEGATIVE E.U./dL — AB
pH, UA: 5 (ref 5.0–8.0)

## 2019-07-11 MED ORDER — VALACYCLOVIR HCL 500 MG PO TABS: 500.0000 mg | ORAL_TABLET | Freq: Every day | ORAL | 4 refills | Status: DC

## 2019-07-11 MED ORDER — NITROFURANTOIN MONOHYD MACRO 100 MG PO CAPS: ORAL_CAPSULE | ORAL | 0 refills | Status: DC

## 2019-07-11 NOTE — Patient Instructions (Signed)
EXERCISE AND DIET:  We recommended that you start or continue a regular exercise program for good health. Regular exercise means any activity that makes your heart beat faster and makes you sweat.  We recommend exercising at least 30 minutes per day at least 3 days a week, preferably 4 or 5.  We also recommend a diet low in fat and sugar.  Inactivity, poor dietary choices and obesity can cause diabetes, heart attack, stroke, and kidney damage, among others.    ALCOHOL AND SMOKING:  Women should limit their alcohol intake to no more than 7 drinks/beers/glasses of wine (combined, not each!) per week. Moderation of alcohol intake to this level decreases your risk of breast cancer and liver damage. And of course, no recreational drugs are part of a healthy lifestyle.  And absolutely no smoking or even second hand smoke. Most people know smoking can cause heart and lung diseases, but did you know it also contributes to weakening of your bones? Aging of your skin?  Yellowing of your teeth and nails?  CALCIUM AND VITAMIN D:  Adequate intake of calcium and Vitamin D are recommended.  The recommendations for exact amounts of these supplements seem to change often, but generally speaking 600 mg of calcium (either carbonate or citrate) and 800 units of Vitamin D per day seems prudent. Certain women may benefit from higher intake of Vitamin D.  If you are among these women, your doctor will have told you during your visit.    PAP SMEARS:  Pap smears, to check for cervical cancer or precancers,  have traditionally been done yearly, although recent scientific advances have shown that most women can have pap smears less often.  However, every woman still should have a physical exam from her gynecologist every year. It will include a breast check, inspection of the vulva and vagina to check for abnormal growths or skin changes, a visual exam of the cervix, and then an exam to evaluate the size and shape of the uterus and  ovaries.  And after 67 years of age, a rectal exam is indicated to check for rectal cancers. We will also provide age appropriate advice regarding health maintenance, like when you should have certain vaccines, screening for sexually transmitted diseases, bone density testing, colonoscopy, mammograms, etc.   MAMMOGRAMS:  All women over 40 years old should have a yearly mammogram. Many facilities now offer a "3D" mammogram, which may cost around $50 extra out of pocket. If possible,  we recommend you accept the option to have the 3D mammogram performed.  It both reduces the number of women who will be called back for extra views which then turn out to be normal, and it is better than the routine mammogram at detecting truly abnormal areas.    COLONOSCOPY:  Colonoscopy to screen for colon cancer is recommended for all women at age 50.  We know, you hate the idea of the prep.  We agree, BUT, having colon cancer and not knowing it is worse!!  Colon cancer so often starts as a polyp that can be seen and removed at colonscopy, which can quite literally save your life!  And if your first colonoscopy is normal and you have no family history of colon cancer, most women don't have to have it again for 10 years.  Once every ten years, you can do something that may end up saving your life, right?  We will be happy to help you get it scheduled when you are ready.    Be sure to check your insurance coverage so you understand how much it will cost.  It may be covered as a preventative service at no cost, but you should check your particular policy.      Urinary Tract Infection, Adult A urinary tract infection (UTI) is an infection of any part of the urinary tract. The urinary tract includes:  The kidneys.  The ureters.  The bladder.  The urethra. These organs make, store, and get rid of pee (urine) in the body. What are the causes? This is caused by germs (bacteria) in your genital area. These germs grow and  cause swelling (inflammation) of your urinary tract. What increases the risk? You are more likely to develop this condition if:  You have a small, thin tube (catheter) to drain pee.  You cannot control when you pee or poop (incontinence).  You are female, and: ? You use these methods to prevent pregnancy:  A medicine that kills sperm (spermicide).  A device that blocks sperm (diaphragm). ? You have low levels of a female hormone (estrogen). ? You are pregnant.  You have genes that add to your risk.  You are sexually active.  You take antibiotic medicines.  You have trouble peeing because of: ? A prostate that is bigger than normal, if you are female. ? A blockage in the part of your body that drains pee from the bladder (urethra). ? A kidney stone. ? A nerve condition that affects your bladder (neurogenic bladder). ? Not getting enough to drink. ? Not peeing often enough.  You have other conditions, such as: ? Diabetes. ? A weak disease-fighting system (immune system). ? Sickle cell disease. ? Gout. ? Injury of the spine. What are the signs or symptoms? Symptoms of this condition include:  Needing to pee right away (urgently).  Peeing often.  Peeing small amounts often.  Pain or burning when peeing.  Blood in the pee.  Pee that smells bad or not like normal.  Trouble peeing.  Pee that is cloudy.  Fluid coming from the vagina, if you are female.  Pain in the belly or lower back. Other symptoms include:  Throwing up (vomiting).  No urge to eat.  Feeling mixed up (confused).  Being tired and grouchy (irritable).  A fever.  Watery poop (diarrhea). How is this treated? This condition may be treated with:  Antibiotic medicine.  Other medicines.  Drinking enough water. Follow these instructions at home:  Medicines  Take over-the-counter and prescription medicines only as told by your doctor.  If you were prescribed an antibiotic medicine,  take it as told by your doctor. Do not stop taking it even if you start to feel better. General instructions  Make sure you: ? Pee until your bladder is empty. ? Do not hold pee for a long time. ? Empty your bladder after sex. ? Wipe from front to back after pooping if you are a female. Use each tissue one time when you wipe.  Drink enough fluid to keep your pee pale yellow.  Keep all follow-up visits as told by your doctor. This is important. Contact a doctor if:  You do not get better after 1-2 days.  Your symptoms go away and then come back. Get help right away if:  You have very bad back pain.  You have very bad pain in your lower belly.  You have a fever.  You are sick to your stomach (nauseous).  You are throwing up. Summary  A  urinary tract infection (UTI) is an infection of any part of the urinary tract.  This condition is caused by germs in your genital area.  There are many risk factors for a UTI. These include having a small, thin tube to drain pee and not being able to control when you pee or poop.  Treatment includes antibiotic medicines for germs.  Drink enough fluid to keep your pee pale yellow. This information is not intended to replace advice given to you by your health care provider. Make sure you discuss any questions you have with your health care provider. Document Revised: 05/27/2018 Document Reviewed: 12/17/2017 Elsevier Patient Education  2020 [employer].  Atrophic Vaginitis Atrophic vaginitis is a condition in which the tissues that line the vagina become dry and thin. This condition occurs in women who have stopped having their period. It is caused by a drop in a female hormone (estrogen). This hormone helps:  To keep the vagina moist.  To make a clear fluid. This clear fluid helps: ? To make the vagina ready for sex. ? To protect the vagina from infection. If the lining of the vagina is dry and thin, it may cause irritation, burning,  or itchiness. It may also:  Make sex painful.  Make an exam of your vagina painful.  Cause bleeding.  Make you lose interest in sex.  Cause a burning feeling when you pee (urinate).  Cause a brown or yellow fluid to come from your vagina. Some women do not have symptoms. Follow these instructions at home: Medicines  Take over-the-counter and prescription medicines only as told by your doctor.  Do not use herbs or other medicines unless your doctor says it is okay.  Use medicines for for dryness. These include: ? Oils to make the vagina soft. ? Creams. ? Moisturizers. General instructions  Do not douche.  Do not use products that can make your vagina dry. These include: ? Scented sprays. ? Scented tampons. ? Scented soaps.  Sex can help increase blood flow and soften the tissue in the vagina. If it hurts to have sex: ? Tell your partner. ? Use products to make sex more comfortable. Use these only as told by your doctor. Contact a doctor if you:  Have discharge from the vagina that is different than usual.  Have a bad smell coming from your vagina.  Have new symptoms.  Do not get better.  Get worse. Summary  Atrophic vaginitis is a condition in which the lining of the vagina becomes dry and thin.  This condition affects women who have stopped having their periods.  Treatment may include using products that help make the vagina soft.  Call a doctor if do not get better with treatment. This information is not intended to replace advice given to you by your health care provider. Make sure you discuss any questions you have with your health care provider. Document Revised: 06/22/2017 Document Reviewed: 06/22/2017 Elsevier Patient Education  2020 [employer].

## 2019-07-11 NOTE — Addendum Note (Signed)
Addended by: Regina Eck on: 07/11/2019 02:26 PM   Modules accepted: Orders

## 2019-07-11 NOTE — Progress Notes (Signed)
69 y.o. [id number] Married  Caucasian Fe here for annual exam. Post menopausal denies vaginal bleeding. Has not noted any vaginal dryness. Has noted burning with urination, odor with cloudy appearance with onset about one week ago. Vaginal itching also occurring with urine burning when touches skin.  Sees Dr. Redmond School for aex, labs, not seen yet this year.. Has appointment for Covid Vaccine. Has not noted HSV outbreak that she is aware. No migraine headache changes. Had fine rash on abdomen, feels anxiety related and resolved. Used Neosporin ointment with no issues. Has had [place]ingle vaccine now with no concerns. Sexually active with wife recently and wonders if this the cause of symptoms. Does not treat vaginally dryness, but uses lubricant for sexual activity. No other health issues today.  Patient's last menstrual period was 06/23/1990 (approximate).          Sexually active: Yes.    The current method of family planning is status post hysterectomy.    Exercising: No.  exercise Smoker:  no  Review of Systems  Constitutional: Negative.   HENT: Negative.   Eyes: Negative.   Respiratory: Negative.   Cardiovascular: Negative.   Gastrointestinal: Negative.   Genitourinary: Positive for urgency.       Burning with urination, abdominal pain & feeling swollen (endoscopy done)  Musculoskeletal: Negative.   Skin:       Vaginal itching, some discharge 1 time  Neurological: Negative.   Endo/Heme/Allergies: Negative.   Psychiatric/Behavioral: Negative.     Health Maintenance: Pap:  12-30-01 neg History of Abnormal Pap: yes MMG:  05-03-2019 category b density birads 1:neg Self Breast exams: yes Colonoscopy:  2018 f/u 67yrs BMD:   2019 TDaP:  2020 Shingles: 2019 Pneumonia: 2020 Hep C and HIV: both neg 2016 Labs: nitrite+, wbc 2+   reports that she has never smoked. She has never used smokeless tobacco. She reports previous alcohol use. She reports that she does not use drugs.  Past Medical  History:  Diagnosis Date  . Abnormal Pap smear of cervix    in 20's  . Anxiety    claustrophobic  . Arthritis    "neck, hands" (09/02/2013)  . Basal cell carcinoma of forehead 2008; 2014  . Dizziness last occurance 10-15-2014   on occasion, not related to activity  . DVT (deep venous thrombosis) ([place]) 06/28/2014   Dr Redmond School, right leg, behind knee  . Environmental allergies   . Exercise-induced asthma 30's  . GERD (gastroesophageal reflux disease)   . H/O hiatal hernia   . Herpes simplex 1976  . History of kidney stones   . Hyperlipidemia   . Migraine teens, returned in 40"s   "depends on the change in the weather; last one was 1 wk ago" (09/02/2013)  . Plantar fasciitis   . PONV (postoperative nausea and vomiting) 3/ 2015   ponv after gallblaffer  . Right ureteral stone   . Seasonal allergic rhinitis   . Tubular adenoma of colon 05/22/2017   per path report.     Past Surgical History:  Procedure Laterality Date  . ABDOMINAL HYSTERECTOMY  1992  . CESAREAN SECTION  1981  . CHOLECYSTECTOMY N/A 09/02/2013   Procedure: LAPAROSCOPIC CHOLECYSTECTOMY;  Surgeon: Harl Bowie, MD;  Location: [facility];  Service: General;  Laterality: N/A;  . CRYOTHERAPY     for abnormal pap smear in 20's  . CYSTOSCOPY W/ URETERAL STENT PLACEMENT  03/05/2012   Procedure: CYSTOSCOPY WITH RETROGRADE PYELOGRAM/URETERAL STENT PLACEMENT;  Surgeon: Hanley Ben, MD;  Location: [facility];  Service: Urology;  Laterality: Right;  . CYSTOSCOPY W/ URETERAL STENT PLACEMENT Right 01/19/2015   Procedure: CYSTOSCOPY WITH RETROGRADE PYELOGRAM/URETERAL  RIGHT STENT PLACEMENT;  Surgeon: Alexis Frock, MD;  [facility];  Service: Urology;  Laterality: Right;  . CYSTOSCOPY WITH RETROGRADE PYELOGRAM, URETEROSCOPY AND STENT PLACEMENT Right 09/08/2014   Procedure: CYSTOSCOPY WITH RIGHT  RETROGRADE PYELOGRAM, URETEROSCOPY AND RIGHT STENT PLACEMENT;  Surgeon: Raynelle Bring, MD;  [facility];   Service: Urology;  Laterality: Right;  . CYSTOSCOPY WITH RETROGRADE PYELOGRAM, URETEROSCOPY AND STENT PLACEMENT Right 10/19/2014   Procedure: CYSTOSCOPY WITH Bilateral RETROGRADE PYELOGRAM/RIGHT URETEROSCOPY,RIGHT JJ STENT;  Surgeon: Carolan Clines, MD;  [facility];  Service: Urology;  Laterality: Right;  . CYSTOSCOPY/RETROGRADE/URETEROSCOPY  11/18/2011   Procedure: CYSTOSCOPY/RETROGRADE/URETEROSCOPY;  Surgeon: Ailene Rud, MD;  [facility];  Service: Urology;  Laterality: Right;  . CYSTOSCOPY/RETROGRADE/URETEROSCOPY/STONE EXTRACTION WITH BASKET  10/21/2011   Procedure: CYSTOSCOPY/RETROGRADE/URETEROSCOPY/STONE EXTRACTION WITH BASKET;  Surgeon: Ailene Rud, MD;  [facility];  Service: Urology;  Laterality: Right;  . CYSTOSCOPY/RETROGRADE/URETEROSCOPY/STONE EXTRACTION WITH BASKET Right 09/10/2012   Procedure: RIGHT URETEROSCOPY/STONE EXTRACTION/LASER LITHOTRIPSY/POSSIBLE DOUBLE J STENT PLACEMENT;  Surgeon: Ailene Rud, MD;  [facility];  Service: Urology;  Laterality: Right;  . MOHS SURGERY  2008; 2014  . PLANTAR FASCIA SURGERY  05/2016  . POSTERIOR PINNACLE MESH SACROSPINOUS REPAIR  11-22-2010   RECURRENT ENTEROCELE  . RIGHT URETEROSCOPIC STONE EXTRACTION W/ STENT PLACEMENT  08-12-2011   x2 stents  . ROBOTICALLY ASSISTED LAPAROSCOPIC URETERAL RE-IMPLANTATION Right 01/19/2015   Procedure: ROBOTICALLY ASSISTED LAPAROSCOPIC URETERAL RE-IMPLANTATION RIGHT;  Surgeon: Alexis Frock, MD;  [facility];  Service: Urology;  Laterality: Right;  . TUBAL LIGATION  1991  . VAULT SUSPENSION (DERMAL GRAFT)/ ENTEROCELE AND RECTOCELE REPAIR  12-05-2005   VAULT PROLAPSE/ ENTERCELE/ RECTOCELE    Current Outpatient Medications  Medication Sig Dispense Refill  . albuterol (PROAIR HFA) 108 (90 Base) MCG/ACT inhaler INHALE 2 PUFFS INTO THE LUNGS EVERY 6 HOURS AS NEEDED FOR WHEEZING/ SHORTNESS OF BREATH. 8.5 g 0  . atorvastatin (LIPITOR) 20 MG  tablet Take 1 tablet (20 mg total) by mouth daily. 90 tablet 3  . beclomethasone (QVAR REDIHALER) 40 MCG/ACT inhaler Inhale 2 puffs into the lungs 2 (two) times daily. 10.6 g 3  . diclofenac sodium (VOLTAREN) 1 % GEL Apply 2 g topically 4 (four) times daily.    . Fluticasone Furoate (ARNUITY ELLIPTA) 100 MCG/ACT AEPB Inhale 1 puff into the lungs daily. 3 each 1  . meloxicam (MOBIC) 15 MG tablet 1 po q d x 2 weeks then prn 30 tablet 0  . methocarbamol (ROBAXIN) 500 MG tablet     . pantoprazole (PROTONIX) 40 MG tablet Take 1 tablet (40 mg total) by mouth daily. 30 tablet 3  . Rimegepant Sulfate (NURTEC PO) Take by mouth.    . valACYclovir (VALTREX) 500 MG tablet Take 1 tablet (500 mg total) by mouth daily. 90 tablet 4   No current facility-administered medications for this visit.    Family History  Problem Relation Age of Onset  . Cancer Father     ROS:  Pertinent items are noted in HPI.  Otherwise, a comprehensive ROS was negative.  Exam:   BP 110/80   Pulse 68   Temp (!) 97.2 F (36.2 C) (Skin)   Resp 16   Ht 5' 0.5" (1.537 m)   Wt 172 lb (78 kg)   LMP 06/23/1990 (Approximate)   BMI  33.04 kg/m  Height: 5' 0.5" (153.7 cm) Ht Readings from Last 3 Encounters:  07/11/19 5' 0.5" (1.537 m)  07/08/18 5' 0.25" (1.53 m)  01/01/18 5' (1.524 m)    General appearance: alert, cooperative and appears stated age Head: Normocephalic, without obvious abnormality, atraumatic Neck: no adenopathy, supple, symmetrical, trachea midline and thyroid normal to inspection and palpation Lungs: clear to auscultation bilaterally CVAT negative bilateral Breasts: normal appearance, no masses or tenderness, No nipple retraction or dimpling, No nipple discharge or bleeding, No axillary or supraclavicular adenopathy Heart: regular rate and rhythm Abdomen: soft, non-tender; no masses,  no organomegaly, positive for suprapubic pain Extremities: extremities normal, atraumatic, no cyanosis or edema Skin:  Skin color, texture, turgor normal. No rashes or lesions Lymph nodes: Cervical, supraclavicular, and axillary nodes normal. No abnormal inguinal nodes palpated Neurologic: Grossly normal   Pelvic: External genitalia:  no lesions, atrophic appearacne              Urethra:  normal appearing urethra with slight  Tenderness   Bladder tender, urinary meatus tender, red              Bartholin's and Skene's: normal                 Vagina: atrophic appearing vagina with normal color and scant discharge, no lesions, affirm taken, small area on left in posterior fornix of excoriated tissue noted with color change, pap smear taken              Cervix: absent              Pap taken: Yes.   Bimanual Exam:  Uterus:  uterus absent              Adnexa: no mass, fullness, tenderness               Rectovaginal: Confirms               Anus:  normal sphincter tone, no lesions  Chaperone present: yes  A:  Well Woman with normal exam  Menopausal s/p TVH  Atrophic vaginitis with dryness, R/O vaginal infection, also R/O vaginal tissue change with Pap  UTI suspect post coital  PCP management of labs  History of HSV needs Rx update  P:   Reviewed health and wellness pertinent to exam  Discussed importance if vaginal bleeding to call.  Discussed use of coconut oil nightly for vaginal dryness and for use with sexual activity.  Discussed UTI finding and need for treatment. Discussed importance of empty bladder before and after sexual activity.  Rx Macrobid see Rx with instructions  Lab: Urine micro/culture  Patient to have labs with PCP.  Rx Valtrex see order with instructions  Pap smear: yes   counseled on breast self exam, mammography screening, feminine hygiene, adequate intake of calcium and vitamin D, diet and exercise  return annually or prn  An After Visit Summary was printed and given to the patient.

## 2019-07-12 LAB — URINALYSIS, MICROSCOPIC ONLY
Casts: NONE SEEN /lpf
Epithelial Cells (non renal): NONE SEEN /hpf (ref 0–10)
RBC, Urine: >30 /hpf — AB (ref 0–2)
WBC, UA: >30 /hpf — AB (ref 0–5)

## 2019-07-12 LAB — VAGINITIS/VAGINOSIS, DNA PROBE
Candida Species: NEGATIVE
Gardnerella vaginalis: NEGATIVE
Trichomonas vaginosis: NEGATIVE

## 2019-07-14 ENCOUNTER — Ambulatory Visit: Payer: BC Managed Care – PPO | Admitting: Certified Nurse Midwife

## 2019-07-14 LAB — URINE CULTURE

## 2019-07-14 LAB — CYTOLOGY - PAP
Comment: NEGATIVE
Diagnosis: NEGATIVE
High risk HPV: NEGATIVE

## 2019-07-25 DIAGNOSIS — N202 Calculus of kidney with calculus of ureter: Secondary | ICD-10-CM | POA: Diagnosis not present

## 2019-07-25 DIAGNOSIS — N3020 Other chronic cystitis without hematuria: Secondary | ICD-10-CM | POA: Diagnosis not present

## 2019-07-25 DIAGNOSIS — N952 Postmenopausal atrophic vaginitis: Secondary | ICD-10-CM | POA: Diagnosis not present

## 2019-07-31 ENCOUNTER — Ambulatory Visit: Payer: PPO | Attending: Internal Medicine

## 2019-07-31 DIAGNOSIS — Z23 Encounter for immunization: Secondary | ICD-10-CM | POA: Insufficient documentation

## 2019-07-31 NOTE — Progress Notes (Signed)
   Covid-19 Vaccination Clinic  Name:  Lougenia Curlee    MRN: [id number] DOB: 1951/03/19  07/31/2019  Ms. Christoper Allegra was observed post Covid-19 immunization for 15 minutes without incidence. She was provided with Vaccine Information Sheet and instruction to access the V-Safe system.   Ms. Christoper Allegra was instructed to call 911 with any severe reactions post vaccine: Marland Kitchen Difficulty breathing  . Swelling of your face and throat  . A fast heartbeat  . A bad rash all over your body  . Dizziness and weakness    Immunizations Administered    Name Date Dose VIS Date Route   Pfizer COVID-19 Vaccine 07/31/2019 11:47 AM 0.3 mL 06/03/2019 Intramuscular   Manufacturer: [place]   Lot: [id number]   [place]: [id number]

## 2019-08-10 ENCOUNTER — Other Ambulatory Visit: Payer: Self-pay

## 2019-08-10 ENCOUNTER — Ambulatory Visit (INDEPENDENT_AMBULATORY_CARE_PROVIDER_SITE_OTHER): Payer: PPO | Admitting: Orthopedic Surgery

## 2019-08-10 DIAGNOSIS — M1812 Unilateral primary osteoarthritis of first carpometacarpal joint, left hand: Secondary | ICD-10-CM | POA: Diagnosis not present

## 2019-08-10 MED ORDER — MELOXICAM 15 MG PO TABS: ORAL_TABLET | ORAL | 0 refills | Status: DC

## 2019-08-11 ENCOUNTER — Encounter: Payer: Self-pay | Admitting: Orthopedic Surgery

## 2019-08-11 NOTE — Progress Notes (Signed)
Office Visit Note   Patient: Carol [name]           Date of Birth: 16-Nov-1950           MRN: [id number] Visit Date: 08/10/2019 Requested by: Denita Lung, MD [place],  [place] [address] PCP: Denita Lung, MD  Subjective: Chief Complaint  Patient presents with  . 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  She is doing well for the first time in weeks or months.  She did take anti-inflammatory for 14 days and then every other day for several days after that.  All in all she is doing well.  She uses a brace for work.  She is taking Mobic on an as-needed basis.  She would like a refill which is done today.              ROS: All systems reviewed are negative as they relate to the chief complaint within the history of present illness.  Patient denies  fevers or chills.   Assessment & Plan: Visit Diagnoses:  1. Arthritis of carpometacarpal (CMC) joint of left thumb     Plan: Impression is improvement in left hand CMC arthritis and de Quervain's tenosynovitis with Mobic anti-inflammatory taken on an as-needed basis.  Plan is to defer injection for now.  Continue with anti-inflammatory as needed.  Follow-up with me as needed.  Follow-Up Instructions: Return if symptoms worsen or fail to improve.   Orders:  No orders of the defined types were placed in this encounter.  Meds ordered this encounter  Medications  . meloxicam (MOBIC) 15 MG tablet    Sig: 1 po q d prn    Dispense:  30 tablet    Refill:  0      Procedures: No procedures performed   Clinical Data: No additional findings.  Objective: Vital Signs: LMP 06/23/1990 (Approximate)   Physical Exam:   Constitutional: Patient appears well-developed HEENT:  Head: Normocephalic Eyes:EOM are normal Neck: Normal range of motion Cardiovascular: Normal rate Pulmonary/chest: Effort normal Neurologic: Patient is  alert Skin: Skin is warm Psychiatric: Patient has normal mood and affect    Ortho Exam: Ortho exam demonstrates full active and passive range of motion of the left hand.  Minimal tenderness over the first dorsal compartment.  She does have a little bit of coarseness with grind testing on that [facility] joint.  EPL FPL function is intact wrist range of motion is full.  Specialty Comments:  No specialty comments available.  Imaging: No results found.   PMFS History: Patient Active Problem List   Diagnosis Date Noted  . Migraine headache with aura 12/14/2018  . History of DVT of lower extremity 10/23/2017  . Nocturnal muscle cramps 10/23/2017  . Tubular adenoma of colon 05/21/2016  . S/P laparoscopic cholecystectomy March 2015 09/03/2013  . Hyperlipidemia with target LDL less than 100 06/27/2011  . Herpes 12/16/2010  . GERD (gastroesophageal reflux disease) 12/16/2010  . HH (hiatus hernia) 12/16/2010  . Hot flashes, menopausal 12/16/2010  . Diverticulosis 12/16/2010  . Asthma 12/16/2010  . 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 Abnormal Pap smear of cervix    in 20's  . Anxiety    claustrophobic  . Arthritis    "neck, hands" (09/02/2013)  . Basal cell carcinoma of forehead 2008; 2014  . Dizziness last occurance  10-15-2014   on occasion, not related to activity  . DVT (deep venous thrombosis) ([place]) 06/28/2014   Dr Redmond School, right leg, behind knee  . Environmental allergies   . Exercise-induced asthma 30's  . GERD (gastroesophageal reflux disease)   . H/O hiatal hernia   . Herpes simplex 1976  . History of kidney stones   . Hyperlipidemia   . Migraine teens, returned in 40"s   "depends on the change in the weather; last one was 1 wk ago" (09/02/2013)  . Plantar fasciitis   . PONV (postoperative nausea and vomiting) 3/ 2015   ponv after gallblaffer  . Right ureteral stone   . Seasonal allergic rhinitis   . Tubular adenoma of  colon 05/22/2017   per path report.     Family History  Problem Relation Age of Onset  . Cancer Father     Past Surgical History:  Procedure Laterality Date  . ABDOMINAL HYSTERECTOMY  1992  . CESAREAN SECTION  1981  . CHOLECYSTECTOMY N/A 09/02/2013   Procedure: LAPAROSCOPIC CHOLECYSTECTOMY;  Surgeon: Harl Bowie, MD;  Location: [facility];  Service: General;  Laterality: N/A;  . CRYOTHERAPY     for abnormal pap smear in 20's  . CYSTOSCOPY W/ URETERAL STENT PLACEMENT  03/05/2012   Procedure: CYSTOSCOPY WITH RETROGRADE PYELOGRAM/URETERAL STENT PLACEMENT;  Surgeon: Hanley Ben, MD;  Location: [place] [facility];  Service: Urology;  Laterality: Right;  . CYSTOSCOPY W/ URETERAL STENT PLACEMENT Right 01/19/2015   Procedure: CYSTOSCOPY WITH RETROGRADE PYELOGRAM/URETERAL  RIGHT STENT PLACEMENT;  Surgeon: Alexis Frock, MD;  [facility];  Service: Urology;  Laterality: Right;  . CYSTOSCOPY WITH RETROGRADE PYELOGRAM, URETEROSCOPY AND STENT PLACEMENT Right 09/08/2014   Procedure: CYSTOSCOPY WITH RIGHT  RETROGRADE PYELOGRAM, URETEROSCOPY AND RIGHT STENT PLACEMENT;  Surgeon: Raynelle Bring, MD;  [facility];  Service: Urology;  Laterality: Right;  . CYSTOSCOPY WITH RETROGRADE PYELOGRAM, URETEROSCOPY AND STENT PLACEMENT Right 10/19/2014   Procedure: CYSTOSCOPY WITH Bilateral RETROGRADE PYELOGRAM/RIGHT URETEROSCOPY,RIGHT JJ STENT;  Surgeon: Carolan Clines, MD;  [facility];  Service: Urology;  Laterality: Right;  . CYSTOSCOPY/RETROGRADE/URETEROSCOPY  11/18/2011   Procedure: CYSTOSCOPY/RETROGRADE/URETEROSCOPY;  Surgeon: Ailene Rud, MD;  [facility];  Service: Urology;  Laterality: Right;  . CYSTOSCOPY/RETROGRADE/URETEROSCOPY/STONE EXTRACTION WITH BASKET  10/21/2011   Procedure: CYSTOSCOPY/RETROGRADE/URETEROSCOPY/STONE EXTRACTION WITH BASKET;  Surgeon: Ailene Rud, MD;  Location: [facility];  Service: Urology;  Laterality: Right;  .  CYSTOSCOPY/RETROGRADE/URETEROSCOPY/STONE EXTRACTION WITH BASKET Right 09/10/2012   Procedure: RIGHT URETEROSCOPY/STONE EXTRACTION/LASER LITHOTRIPSY/POSSIBLE DOUBLE J STENT PLACEMENT;  Surgeon: Ailene Rud, MD;  [facility];  Service: Urology;  Laterality: Right;  . MOHS SURGERY  2008; 2014  . PLANTAR FASCIA SURGERY  05/2016  . POSTERIOR PINNACLE MESH SACROSPINOUS REPAIR  11-22-2010   RECURRENT ENTEROCELE  . RIGHT URETEROSCOPIC STONE EXTRACTION W/ STENT PLACEMENT  08-12-2011   x2 stents  . ROBOTICALLY ASSISTED LAPAROSCOPIC URETERAL RE-IMPLANTATION Right 01/19/2015   Procedure: ROBOTICALLY ASSISTED LAPAROSCOPIC URETERAL RE-IMPLANTATION RIGHT;  Surgeon: Alexis Frock, MD;  [facility];  Service: Urology;  Laterality: Right;  . TUBAL LIGATION  1991  . VAULT SUSPENSION (DERMAL GRAFT)/ ENTEROCELE AND RECTOCELE REPAIR  12-05-2005   VAULT PROLAPSE/ ENTERCELE/ RECTOCELE   Social History   Occupational History  . Not on file  Tobacco Use  . Smoking status: Never Smoker  . Smokeless tobacco: Never Used  Substance and Sexual Activity  . Alcohol use: Not Currently  . Drug use: No  . Sexual activity: Yes  Partners: Female    Birth control/protection: Surgical    Comment: hysterectomy

## 2019-08-18 ENCOUNTER — Ambulatory Visit: Payer: Self-pay

## 2019-08-22 DIAGNOSIS — H11121 Conjunctival concretions, right eye: Secondary | ICD-10-CM | POA: Diagnosis not present

## 2019-08-24 ENCOUNTER — Ambulatory Visit: Payer: PPO | Attending: Internal Medicine

## 2019-08-24 ENCOUNTER — Ambulatory Visit: Payer: PPO

## 2019-08-24 DIAGNOSIS — Z23 Encounter for immunization: Secondary | ICD-10-CM | POA: Insufficient documentation

## 2019-08-24 NOTE — Progress Notes (Signed)
   Covid-19 Vaccination Clinic  Name:  Correen Rebeck    MRN: [id number] DOB: 03/25/1951  08/24/2019  Ms. Christoper Allegra was observed post Covid-19 immunization for 30 minutes based on pre-vaccination screening without incident. She was provided with Vaccine Information Sheet and instruction to access the V-Safe system.   Ms. Christoper Allegra was instructed to call 911 with any severe reactions post vaccine: Marland Kitchen Difficulty breathing  . Swelling of face and throat  . A fast heartbeat  . A bad rash all over body  . Dizziness and weakness   Immunizations Administered    Name Date Dose VIS Date Route   Pfizer COVID-19 Vaccine 08/24/2019  9:44 AM 0.3 mL 06/03/2019 Intramuscular   Manufacturer: [place]   Lot: [id number]   [place]: [id number]

## 2019-09-11 ENCOUNTER — Other Ambulatory Visit: Payer: Self-pay | Admitting: Family Medicine

## 2019-09-14 ENCOUNTER — Encounter: Payer: Self-pay | Admitting: Certified Nurse Midwife

## 2019-10-17 ENCOUNTER — Telehealth: Payer: Self-pay

## 2019-10-17 ENCOUNTER — Other Ambulatory Visit: Payer: Self-pay | Admitting: Family Medicine

## 2019-10-17 NOTE — Telephone Encounter (Signed)
Pt was called to advise of the need for a med check and a physical. [facility]

## 2019-10-21 ENCOUNTER — Ambulatory Visit (INDEPENDENT_AMBULATORY_CARE_PROVIDER_SITE_OTHER): Payer: PPO | Admitting: Family Medicine

## 2019-10-21 ENCOUNTER — Other Ambulatory Visit: Payer: Self-pay

## 2019-10-21 ENCOUNTER — Encounter: Payer: Self-pay | Admitting: Family Medicine

## 2019-10-21 VITALS — BP 126/84 | HR 72 | Temp 98.2°F | Wt 166.6 lb

## 2019-10-21 DIAGNOSIS — D126 Benign neoplasm of colon, unspecified: Secondary | ICD-10-CM

## 2019-10-21 DIAGNOSIS — G43109 Migraine with aura, not intractable, without status migrainosus: Secondary | ICD-10-CM

## 2019-10-21 DIAGNOSIS — N951 Menopausal and female climacteric states: Secondary | ICD-10-CM

## 2019-10-21 DIAGNOSIS — E785 Hyperlipidemia, unspecified: Secondary | ICD-10-CM | POA: Diagnosis not present

## 2019-10-21 DIAGNOSIS — B009 Herpesviral infection, unspecified: Secondary | ICD-10-CM

## 2019-10-21 DIAGNOSIS — Z86718 Personal history of other venous thrombosis and embolism: Secondary | ICD-10-CM

## 2019-10-21 DIAGNOSIS — K219 Gastro-esophageal reflux disease without esophagitis: Secondary | ICD-10-CM | POA: Diagnosis not present

## 2019-10-21 DIAGNOSIS — J4520 Mild intermittent asthma, uncomplicated: Secondary | ICD-10-CM | POA: Diagnosis not present

## 2019-10-21 MED ORDER — ATORVASTATIN CALCIUM 20 MG PO TABS: 20.0000 mg | ORAL_TABLET | Freq: Every day | ORAL | 3 refills | Status: DC

## 2019-10-21 NOTE — Patient Instructions (Signed)
Try black cohosh for your hot flashes

## 2019-10-21 NOTE — Progress Notes (Signed)
   Subjective:    Patient ID: Carol [name], female    DOB: 1950-08-07, 69 y.o.   MRN: [id number]  HPI She is here for an interval evaluation.  She has had difficulty with de Quervain's tenosynovitis and is doing much better with that.  She has been seeing Dr. Marlou Sa for that.  She also gets routine follow-up with Dr. Bess Harvest concerning her underlying history of renal stones as well as ureteral issues.  She had a colonoscopy last year and is scheduled for one in another 5 years years.  She is not using her daily inhaler and has only had to use the albuterol twice in the last several months.  She did note in the past difficulty with exercise-induced asthma.  Her migraines seem to be under good control.  She does have some postmenopausal symptoms and presently is on no medication.  She continues on Valtrex and has not had a breakthrough.  She has a previous history of DVT but no recent difficulty with leg swelling.  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  Review of Systems     Objective:   Physical Exam Alert and in no distress. Tympanic membranes and canals are normal. Pharyngeal area is normal. Neck is supple without adenopathy or thyromegaly. Cardiac exam shows a regular sinus rhythm without murmurs or gallops. Lungs are clear to auscultation.       Assessment & Plan:  Mild intermittent asthma without complication - Plan: CBC with Differential/Platelet, Comprehensive metabolic panel    Continue on intermittent use of albuterol and no need to take         the long-acting inhaler. Tubular adenoma of colon      Continue with routine follow-up Migraine with aura and without status migrainosus, not intractable       Conservative care for the migraine. Hyperlipidemia with target LDL less than 100 - Plan: atorvastatin (LIPITOR) 20 MG tablet, Lipid panel  Gastroesophageal reflux disease without esophagitis        Use PPI as needed Hot flashes, menopausal         Recommend that she try black cohosh. Herpes       Continue on Valtrex History of DVT of lower extremity - Plan: CBC with Differential/Platelet, Comprehensive metabolic panel

## 2019-10-22 LAB — COMPREHENSIVE METABOLIC PANEL
ALT: 26 IU/L (ref 0–32)
AST: 26 IU/L (ref 0–40)
Albumin/Globulin Ratio: 2.1 (ref 1.2–2.2)
Albumin: 4.9 g/dL — ABNORMAL HIGH (ref 3.8–4.8)
Alkaline Phosphatase: 96 IU/L (ref 39–117)
BUN/Creatinine Ratio: 16 (ref 12–28)
BUN: 14 mg/dL (ref 8–27)
Bilirubin Total: 0.2 mg/dL (ref 0.0–1.2)
CO2: 20 mmol/L (ref 20–29)
Calcium: 9.5 mg/dL (ref 8.7–10.3)
Chloride: 107 mmol/L — ABNORMAL HIGH (ref 96–106)
Creatinine, Ser: 0.88 mg/dL (ref 0.57–1.00)
GFR calc Af Amer: 79 mL/min/{1.73_m2} (ref >59)
GFR calc non Af Amer: 69 mL/min/{1.73_m2} (ref >59)
Globulin, Total: 2.3 g/dL (ref 1.5–4.5)
Glucose: 97 mg/dL (ref 65–99)
Potassium: 4.6 mmol/L (ref 3.5–5.2)
Sodium: 142 mmol/L (ref 134–144)
Total Protein: 7.2 g/dL (ref 6.0–8.5)

## 2019-10-22 LAB — CBC WITH DIFFERENTIAL/PLATELET
Basophils Absolute: 0.1 10*3/uL (ref 0.0–0.2)
Basos: 1 %
EOS (ABSOLUTE): 0.2 10*3/uL (ref 0.0–0.4)
Eos: 2 %
Hematocrit: 44.2 % (ref 34.0–46.6)
Hemoglobin: 14.4 g/dL (ref 11.1–15.9)
Immature Grans (Abs): 0 10*3/uL (ref 0.0–0.1)
Immature Granulocytes: 0 %
Lymphocytes Absolute: 2 10*3/uL (ref 0.7–3.1)
Lymphs: 28 %
MCH: 28.5 pg (ref 26.6–33.0)
MCHC: 32.6 g/dL (ref 31.5–35.7)
MCV: 88 fL (ref 79–97)
Monocytes Absolute: 0.5 10*3/uL (ref 0.1–0.9)
Monocytes: 7 %
Neutrophils Absolute: 4.3 10*3/uL (ref 1.4–7.0)
Neutrophils: 62 %
Platelets: 254 10*3/uL (ref 150–450)
RBC: 5.05 x10E6/uL (ref 3.77–5.28)
RDW: 13.6 % (ref 11.7–15.4)
WBC: 7 10*3/uL (ref 3.4–10.8)

## 2019-10-22 LAB — LIPID PANEL
Chol/HDL Ratio: 3.4 ratio (ref 0.0–4.4)
Cholesterol, Total: 160 mg/dL (ref 100–199)
HDL: 47 mg/dL (ref >39)
LDL Chol Calc (NIH): 84 mg/dL (ref 0–99)
Triglycerides: 167 mg/dL — ABNORMAL HIGH (ref 0–149)
VLDL Cholesterol Cal: 29 mg/dL (ref 5–40)

## 2019-10-29 DIAGNOSIS — X500XXA Overexertion from strenuous movement or load, initial encounter: Secondary | ICD-10-CM | POA: Diagnosis not present

## 2019-10-29 DIAGNOSIS — M62830 Muscle spasm of back: Secondary | ICD-10-CM | POA: Diagnosis not present

## 2019-10-29 DIAGNOSIS — S39012A Strain of muscle, fascia and tendon of lower back, initial encounter: Secondary | ICD-10-CM | POA: Diagnosis not present

## 2019-11-09 DIAGNOSIS — Z6832 Body mass index (BMI) 32.0-32.9, adult: Secondary | ICD-10-CM | POA: Diagnosis not present

## 2019-11-09 DIAGNOSIS — Z1283 Encounter for screening for malignant neoplasm of skin: Secondary | ICD-10-CM | POA: Diagnosis not present

## 2019-11-09 DIAGNOSIS — E669 Obesity, unspecified: Secondary | ICD-10-CM | POA: Diagnosis not present

## 2019-11-09 DIAGNOSIS — M79645 Pain in left finger(s): Secondary | ICD-10-CM | POA: Diagnosis not present

## 2019-11-09 DIAGNOSIS — X32XXXD Exposure to sunlight, subsequent encounter: Secondary | ICD-10-CM | POA: Diagnosis not present

## 2019-11-09 DIAGNOSIS — L570 Actinic keratosis: Secondary | ICD-10-CM | POA: Diagnosis not present

## 2019-11-09 DIAGNOSIS — M150 Primary generalized (osteo)arthritis: Secondary | ICD-10-CM | POA: Diagnosis not present

## 2019-11-09 DIAGNOSIS — L821 Other seborrheic keratosis: Secondary | ICD-10-CM | POA: Diagnosis not present

## 2019-11-14 ENCOUNTER — Other Ambulatory Visit: Payer: Self-pay | Admitting: Family Medicine

## 2019-12-12 ENCOUNTER — Other Ambulatory Visit: Payer: Self-pay | Admitting: Family Medicine

## 2019-12-22 ENCOUNTER — Other Ambulatory Visit: Payer: Self-pay

## 2019-12-22 ENCOUNTER — Ambulatory Visit (INDEPENDENT_AMBULATORY_CARE_PROVIDER_SITE_OTHER): Payer: PPO | Admitting: Family Medicine

## 2019-12-22 VITALS — BP 104/70 | HR 71 | Temp 97.4°F | Wt 160.8 lb

## 2019-12-22 DIAGNOSIS — Z23 Encounter for immunization: Secondary | ICD-10-CM

## 2019-12-22 DIAGNOSIS — Z7189 Other specified counseling: Secondary | ICD-10-CM

## 2019-12-22 NOTE — Progress Notes (Signed)
   Subjective:    Patient ID: Carol [name], female    DOB: April 02, 1951, 69 y.o.   MRN: [id number]  HPI She is here to get her pneumococcal vaccine and also has questions concerning Covid.   Review of Systems     Objective:   Physical Exam Alert and in no distress otherwise not examined       Assessment & Plan:  Need for pneumococcal vaccination - Plan: Pneumococcal polysaccharide vaccine 23-valent greater than or equal to 2yo subcutaneous/IM  Counseled about COVID-19 virus infection I discussed Covid shots which she has had, possible infection after shots, exposure to people who might have Covid and what her risk is with her underlying age and asthma.  Cautioned to limit her exposure to crowds etc.  Discussed the length of immunity concerning Covid.

## 2020-01-09 ENCOUNTER — Other Ambulatory Visit: Payer: Self-pay | Admitting: Family Medicine

## 2020-01-17 ENCOUNTER — Other Ambulatory Visit: Payer: Self-pay

## 2020-01-23 DIAGNOSIS — M79671 Pain in right foot: Secondary | ICD-10-CM | POA: Diagnosis not present

## 2020-01-23 DIAGNOSIS — M21961 Unspecified acquired deformity of right lower leg: Secondary | ICD-10-CM | POA: Diagnosis not present

## 2020-01-23 DIAGNOSIS — M84374A Stress fracture, right foot, initial encounter for fracture: Secondary | ICD-10-CM | POA: Diagnosis not present

## 2020-01-23 DIAGNOSIS — M21962 Unspecified acquired deformity of left lower leg: Secondary | ICD-10-CM | POA: Diagnosis not present

## 2020-02-06 ENCOUNTER — Encounter: Payer: Self-pay | Admitting: Family Medicine

## 2020-02-13 ENCOUNTER — Other Ambulatory Visit: Payer: Self-pay | Admitting: Family Medicine

## 2020-02-13 DIAGNOSIS — M21962 Unspecified acquired deformity of left lower leg: Secondary | ICD-10-CM | POA: Diagnosis not present

## 2020-02-13 DIAGNOSIS — M84375D Stress fracture, left foot, subsequent encounter for fracture with routine healing: Secondary | ICD-10-CM | POA: Diagnosis not present

## 2020-02-13 DIAGNOSIS — M21961 Unspecified acquired deformity of right lower leg: Secondary | ICD-10-CM | POA: Diagnosis not present

## 2020-02-13 DIAGNOSIS — M79671 Pain in right foot: Secondary | ICD-10-CM | POA: Diagnosis not present

## 2020-02-28 ENCOUNTER — Other Ambulatory Visit: Payer: Self-pay

## 2020-02-28 ENCOUNTER — Other Ambulatory Visit (INDEPENDENT_AMBULATORY_CARE_PROVIDER_SITE_OTHER): Payer: PPO

## 2020-02-28 DIAGNOSIS — Z23 Encounter for immunization: Secondary | ICD-10-CM

## 2020-03-05 DIAGNOSIS — M21962 Unspecified acquired deformity of left lower leg: Secondary | ICD-10-CM | POA: Diagnosis not present

## 2020-03-05 DIAGNOSIS — M79671 Pain in right foot: Secondary | ICD-10-CM | POA: Diagnosis not present

## 2020-03-05 DIAGNOSIS — M84375D Stress fracture, left foot, subsequent encounter for fracture with routine healing: Secondary | ICD-10-CM | POA: Diagnosis not present

## 2020-03-05 DIAGNOSIS — M21961 Unspecified acquired deformity of right lower leg: Secondary | ICD-10-CM | POA: Diagnosis not present

## 2020-03-12 ENCOUNTER — Other Ambulatory Visit: Payer: Self-pay | Admitting: Family Medicine

## 2020-03-28 ENCOUNTER — Other Ambulatory Visit: Payer: Self-pay | Admitting: Family Medicine

## 2020-03-28 DIAGNOSIS — Z1231 Encounter for screening mammogram for malignant neoplasm of breast: Secondary | ICD-10-CM

## 2020-04-04 ENCOUNTER — Other Ambulatory Visit: Payer: Self-pay

## 2020-04-04 ENCOUNTER — Ambulatory Visit (INDEPENDENT_AMBULATORY_CARE_PROVIDER_SITE_OTHER): Payer: PPO

## 2020-04-04 DIAGNOSIS — Z23 Encounter for immunization: Secondary | ICD-10-CM

## 2020-04-04 DIAGNOSIS — L570 Actinic keratosis: Secondary | ICD-10-CM | POA: Diagnosis not present

## 2020-04-04 DIAGNOSIS — X32XXXD Exposure to sunlight, subsequent encounter: Secondary | ICD-10-CM | POA: Diagnosis not present

## 2020-04-12 ENCOUNTER — Encounter: Payer: Self-pay | Admitting: Obstetrics & Gynecology

## 2020-05-21 ENCOUNTER — Ambulatory Visit
Admission: RE | Admit: 2020-05-21 | Discharge: 2020-05-21 | Disposition: A | Discharge status: Home / Self Care | Payer: PPO | Source: Ambulatory Visit | Attending: Family Medicine | Admitting: Family Medicine

## 2020-05-21 ENCOUNTER — Other Ambulatory Visit: Payer: Self-pay

## 2020-05-21 DIAGNOSIS — Z1231 Encounter for screening mammogram for malignant neoplasm of breast: Secondary | ICD-10-CM | POA: Diagnosis not present

## 2020-05-24 ENCOUNTER — Encounter: Payer: Self-pay | Admitting: Family Medicine

## 2020-05-24 ENCOUNTER — Ambulatory Visit (INDEPENDENT_AMBULATORY_CARE_PROVIDER_SITE_OTHER): Payer: PPO | Admitting: Family Medicine

## 2020-05-24 ENCOUNTER — Other Ambulatory Visit: Payer: Self-pay

## 2020-05-24 VITALS — BP 118/72 | HR 74 | Temp 97.1°F | Wt 152.0 lb

## 2020-05-24 DIAGNOSIS — N3000 Acute cystitis without hematuria: Secondary | ICD-10-CM | POA: Diagnosis not present

## 2020-05-24 DIAGNOSIS — Z87442 Personal history of urinary calculi: Secondary | ICD-10-CM | POA: Insufficient documentation

## 2020-05-24 DIAGNOSIS — R109 Unspecified abdominal pain: Secondary | ICD-10-CM

## 2020-05-24 LAB — POCT URINALYSIS DIP (PROADVANTAGE DEVICE)
Bilirubin, UA: NEGATIVE
Blood, UA: NEGATIVE
Glucose, UA: NEGATIVE mg/dL
Ketones, POC UA: NEGATIVE mg/dL
Nitrite, UA: POSITIVE — AB
Specific Gravity, Urine: 1.025
Urobilinogen, Ur: 0.2
pH, UA: 6 (ref 5.0–8.0)

## 2020-05-24 MED ORDER — SULFAMETHOXAZOLE-TRIMETHOPRIM 800-160 MG PO TABS: 1.0000 | ORAL_TABLET | Freq: Two times a day (BID) | ORAL | 0 refills | Status: DC

## 2020-05-24 MED ORDER — TRAMADOL HCL 50 MG PO TABS: 50.0000 mg | ORAL_TABLET | Freq: Three times a day (TID) | ORAL | 0 refills | Status: AC | PRN

## 2020-05-24 NOTE — Progress Notes (Signed)
   Subjective:    Patient ID: Carol [name], female    DOB: 16-Jan-1951, 69 y.o.   MRN: [id number]  HPI She complains of a several day history of frequency, urgency and dysuria as well as some slight right-sided low back pain.  She also has a history of renal stones however states the pain is not anywhere near what she expects with the stones. She also complains of a several month history of intermittent left upper quadrant pain that she describes as burning.  She did on one occasion try an antacid pill with questionable success.  She cannot relate this to any particular foods, stress and has had no vomiting or diarrhea.   Review of Systems     Objective:   Physical Exam Alert and in no distress.  Urine dipstick was positive for white cells and nitrite.       Assessment & Plan:  Acute cystitis without hematuria - Plan: sulfamethoxazole-trimethoprim (BACTRIM DS) 800-160 MG tablet, POCT Urinalysis DIP (Proadvantage Device)  History of renal stone - Plan: traMADol (ULTRAM) 50 MG tablet  Intermittent abdominal pain Discussed the treatment of renal stone.  She has difficulty with codeine and I will therefore give her tramadol which she has used in the past.  Instructed her on using this medication if she has renal stone symptoms and then calling me at her earliest convenience for further evaluation and probable CT with referral to urology if it is appropriate.  She was comfortable with that. I then discussed the intermittent abdominal pain.  Recommend she keep better track of this to see if there is a pattern and also try liquid Maalox with this. Over 30 minutes spent in review of her medical record, notes from neurology, counseling and coordination of care.

## 2020-05-29 DIAGNOSIS — K219 Gastro-esophageal reflux disease without esophagitis: Secondary | ICD-10-CM | POA: Diagnosis not present

## 2020-05-29 DIAGNOSIS — K5730 Diverticulosis of large intestine without perforation or abscess without bleeding: Secondary | ICD-10-CM | POA: Diagnosis not present

## 2020-05-29 DIAGNOSIS — Z1211 Encounter for screening for malignant neoplasm of colon: Secondary | ICD-10-CM | POA: Diagnosis not present

## 2020-05-29 DIAGNOSIS — Z86010 Personal history of colonic polyps: Secondary | ICD-10-CM | POA: Diagnosis not present

## 2020-06-04 ENCOUNTER — Other Ambulatory Visit: Payer: Self-pay | Admitting: Family Medicine

## 2020-06-27 DIAGNOSIS — K635 Polyp of colon: Secondary | ICD-10-CM | POA: Diagnosis not present

## 2020-06-27 DIAGNOSIS — Z1211 Encounter for screening for malignant neoplasm of colon: Secondary | ICD-10-CM | POA: Diagnosis not present

## 2020-06-27 DIAGNOSIS — Z86010 Personal history of colonic polyps: Secondary | ICD-10-CM | POA: Diagnosis not present

## 2020-06-27 DIAGNOSIS — D120 Benign neoplasm of cecum: Secondary | ICD-10-CM | POA: Diagnosis not present

## 2020-06-27 DIAGNOSIS — K5730 Diverticulosis of large intestine without perforation or abscess without bleeding: Secondary | ICD-10-CM | POA: Diagnosis not present

## 2020-06-27 LAB — HM COLONOSCOPY

## 2020-07-09 ENCOUNTER — Ambulatory Visit: Payer: PPO | Admitting: Family Medicine

## 2020-07-11 ENCOUNTER — Ambulatory Visit: Payer: PPO | Admitting: Certified Nurse Midwife

## 2020-07-13 ENCOUNTER — Ambulatory Visit: Payer: PPO | Admitting: Obstetrics & Gynecology

## 2020-07-16 ENCOUNTER — Telehealth (INDEPENDENT_AMBULATORY_CARE_PROVIDER_SITE_OTHER): Payer: PPO | Admitting: Family Medicine

## 2020-07-16 ENCOUNTER — Encounter: Payer: Self-pay | Admitting: Family Medicine

## 2020-07-16 ENCOUNTER — Other Ambulatory Visit: Payer: Self-pay

## 2020-07-16 VITALS — Temp 97.1°F | Wt 150.0 lb

## 2020-07-16 DIAGNOSIS — R0982 Postnasal drip: Secondary | ICD-10-CM

## 2020-07-16 DIAGNOSIS — J029 Acute pharyngitis, unspecified: Secondary | ICD-10-CM

## 2020-07-16 NOTE — Progress Notes (Signed)
   Subjective:    Patient ID: Carol [name], female    DOB: [birth year], 70 y.o.   MRN: [id number]  HPI I connected with  Tamrah Victorino on 07/16/20 by a video enabled telemedicine application and verified that I am speaking with the correct person using two identifiers.  Caregility used I'm in the office.Patient at home I discussed the limitations of evaluation and management by telemedicine. The patient expressed understanding and agreed to proceed. She complains of a 2-day history of rhinorrhea, PND, sore throat mainly on the left as well as a lesion on her left tonsil.  No fever, chills, smell or taste difference.  She has not had a Covid test but but has had 3 vaccines.   Review of Systems     Objective:   Physical Exam Alert and in no distress otherwise not examined       Assessment & Plan:  Sore throat - Plan: POC COVID-19, Novel Coronavirus, NAA ([facility])  PND (post-nasal drip) - Plan: POC COVID-19, Novel Coronavirus, NAA ([facility]) She will come in tomorrow morning for further testing.  If these tests are negative, I will return here for further evaluation of the sore throat.  She was comfortable with that. 21 minutes spent in reviewing her medical record today, documentation and counseling.

## 2020-07-17 ENCOUNTER — Other Ambulatory Visit (INDEPENDENT_AMBULATORY_CARE_PROVIDER_SITE_OTHER): Payer: PPO

## 2020-07-17 DIAGNOSIS — J029 Acute pharyngitis, unspecified: Secondary | ICD-10-CM | POA: Diagnosis not present

## 2020-07-17 DIAGNOSIS — R0982 Postnasal drip: Secondary | ICD-10-CM

## 2020-07-17 LAB — POC COVID19 BINAXNOW: SARS Coronavirus 2 Ag: NEGATIVE

## 2020-07-18 LAB — NOVEL CORONAVIRUS, NAA: SARS-CoV-2, NAA: NOT DETECTED

## 2020-07-18 LAB — SARS-COV-2, NAA 2 DAY TAT

## 2020-07-19 ENCOUNTER — Other Ambulatory Visit: Payer: Self-pay

## 2020-07-19 ENCOUNTER — Ambulatory Visit (INDEPENDENT_AMBULATORY_CARE_PROVIDER_SITE_OTHER): Payer: PPO | Admitting: Family Medicine

## 2020-07-19 VITALS — BP 130/84 | HR 72 | Temp 98.1°F | Wt 154.2 lb

## 2020-07-19 DIAGNOSIS — J0100 Acute maxillary sinusitis, unspecified: Secondary | ICD-10-CM | POA: Diagnosis not present

## 2020-07-19 DIAGNOSIS — J029 Acute pharyngitis, unspecified: Secondary | ICD-10-CM

## 2020-07-19 LAB — POCT RAPID STREP A (OFFICE): Rapid Strep A Screen: NEGATIVE

## 2020-07-19 MED ORDER — AMOXICILLIN 875 MG PO TABS: 875.0000 mg | ORAL_TABLET | Freq: Two times a day (BID) | ORAL | 0 refills | Status: DC

## 2020-07-19 NOTE — Progress Notes (Signed)
   Subjective:    Patient ID: Carol [name], female    DOB: Nov 01, 1950, 70 y.o.   MRN: [id number]  HPI She states that on Saturday she developed a slight left-sided sore throat and did note a spot on it. Sunday she then noted left earache with some postnasal drainage, left upper tooth pain as well as left sinus discomfort. She was Covid tested on Tuesday with a negative result. No fever, chills, cough or congestion.   Review of Systems     Objective:   Physical Exam Alert and in no distress. TMs normal. Throat does show a whitish lesion on the left tonsil. An attempt was made to dislodge it and it was unsuccessful. Neck is supple without adenopathy. Strep screen negative.       Assessment & Plan:  Sore throat - Plan: Rapid Strep A  Acute non-recurrent maxillary sinusitis - Plan: amoxicillin (AMOXIL) 875 MG tablet I explained that I thought the sore throat was mainly a tonsil with and recommended gargling. Her other symptoms seem to be more consistent with a sinus infection and I will therefore place her on Amoxil.

## 2020-07-20 ENCOUNTER — Encounter: Payer: Self-pay | Admitting: Family Medicine

## 2020-07-30 DIAGNOSIS — N135 Crossing vessel and stricture of ureter without hydronephrosis: Secondary | ICD-10-CM | POA: Diagnosis not present

## 2020-07-30 DIAGNOSIS — N3020 Other chronic cystitis without hematuria: Secondary | ICD-10-CM | POA: Diagnosis not present

## 2020-07-30 DIAGNOSIS — N202 Calculus of kidney with calculus of ureter: Secondary | ICD-10-CM | POA: Diagnosis not present

## 2020-08-19 ENCOUNTER — Encounter: Payer: Self-pay | Admitting: Family Medicine

## 2020-08-23 ENCOUNTER — Encounter: Payer: Self-pay | Admitting: Family Medicine

## 2020-08-23 ENCOUNTER — Ambulatory Visit (INDEPENDENT_AMBULATORY_CARE_PROVIDER_SITE_OTHER): Payer: PPO | Admitting: Family Medicine

## 2020-08-23 ENCOUNTER — Other Ambulatory Visit: Payer: Self-pay

## 2020-08-23 VITALS — BP 116/80 | HR 69 | Temp 98.0°F | Ht 60.0 in | Wt 153.0 lb

## 2020-08-23 DIAGNOSIS — E785 Hyperlipidemia, unspecified: Secondary | ICD-10-CM

## 2020-08-23 DIAGNOSIS — B009 Herpesviral infection, unspecified: Secondary | ICD-10-CM | POA: Diagnosis not present

## 2020-08-23 DIAGNOSIS — Z86718 Personal history of other venous thrombosis and embolism: Secondary | ICD-10-CM

## 2020-08-23 DIAGNOSIS — G43109 Migraine with aura, not intractable, without status migrainosus: Secondary | ICD-10-CM

## 2020-08-23 DIAGNOSIS — Z8619 Personal history of other infectious and parasitic diseases: Secondary | ICD-10-CM | POA: Diagnosis not present

## 2020-08-23 DIAGNOSIS — N951 Menopausal and female climacteric states: Secondary | ICD-10-CM

## 2020-08-23 DIAGNOSIS — D126 Benign neoplasm of colon, unspecified: Secondary | ICD-10-CM | POA: Diagnosis not present

## 2020-08-23 DIAGNOSIS — Z0000 Encounter for general adult medical examination without abnormal findings: Secondary | ICD-10-CM

## 2020-08-23 DIAGNOSIS — K219 Gastro-esophageal reflux disease without esophagitis: Secondary | ICD-10-CM | POA: Diagnosis not present

## 2020-08-23 DIAGNOSIS — J4520 Mild intermittent asthma, uncomplicated: Secondary | ICD-10-CM | POA: Diagnosis not present

## 2020-08-23 DIAGNOSIS — Z87442 Personal history of urinary calculi: Secondary | ICD-10-CM

## 2020-08-23 MED ORDER — PANTOPRAZOLE SODIUM 40 MG PO TBEC: 40.0000 mg | DELAYED_RELEASE_TABLET | Freq: Every day | ORAL | 3 refills | Status: DC

## 2020-08-23 MED ORDER — VALACYCLOVIR HCL 500 MG PO TABS: 500.0000 mg | ORAL_TABLET | Freq: Every day | ORAL | 4 refills | Status: DC

## 2020-08-23 MED ORDER — ATORVASTATIN CALCIUM 20 MG PO TABS: 20.0000 mg | ORAL_TABLET | Freq: Every day | ORAL | 3 refills | Status: DC

## 2020-08-23 NOTE — Progress Notes (Addendum)
Carol [name] is a 70 y.o. female who presents for annual wellness visit,CPE and follow-up on chronic medical conditions.  She recently had a colonoscopy which did show adenomatous polyps.  She is scheduled for follow-up in 2027.  Her reflux seems to be under good control with Protonix.  She continues on Lipitor with no difficulty.  Her migraines seem to be under good control on her present medication regimen.  She is seeing Dr. Domingo Cocking for this.  She would like a refill on her Valtrex.  Her asthma seems to be doing well on the present medication regimen.  She does have difficulty with hot flashes and is presently using black cohosh.  She has a history of renal stones but has not had difficulty with that in quite some time.  She does have a previous history of DVT.  Immunizations and Health Maintenance Immunization History  Administered Date(s) Administered  . Fluad Quad(high Dose 65+) 03/13/2019, 02/28/2020  . Influenza Whole 04/19/2007, 09/04/2008  . Influenza-Unspecified 03/23/2013, 03/14/2016, 04/12/2017, 03/13/2019  . PFIZER(Purple Top)SARS-COV-2 Vaccination 07/31/2019, 08/24/2019, 04/04/2020  . Pneumococcal Conjugate-13 12/14/2018  . Pneumococcal Polysaccharide-23 12/22/2019  . Td 12/13/2001  . Tdap 06/23/2008, 12/14/2018  . Zoster 06/01/2015  . Zoster Recombinat (Shingrix) 06/17/2017, 08/24/2017   There are no preventive care reminders to display for this patient.  Last Pap smear: aged out Last mammogram: 05/21/20 Last colonoscopy: 06/27/20 Last DEXA: 07/13/17 Dentist: Q six months Ophtho: Q year Exercise: waling three days a week   Other doctors caring for patient include: Dr. Collene Mares GI, Dr. Sabra Heck GYN,  Dr. Marlou Sa ortho  Advanced directives: Does Patient Have a Medical Advance Directive?: Yes Type of Advance Directive: [place] will Does patient want to make changes to medical advance directive?: No - Patient declined Copy of [place] in Chart?: No - copy requested  Depression screen:  See questionnaire below.  Depression screen [facility] 2/9 08/23/2020 05/24/2020 03/16/2017 10/15/2015  Decreased Interest 0 0 0 0  Down, Depressed, Hopeless 0 0 0 0  PHQ - 2 Score 0 0 0 0    Fall Risk Screen: see questionnaire below. Fall Risk  08/23/2020 01/17/2020 03/16/2017 10/15/2015  Falls in the past year? 0 0 Yes No  Comment - Emmi Telephone Survey: data to providers prior to load - -  Number falls in past yr: 0 - 1 -  Injury with Fall? 0 - No -  Risk for fall due to : No Fall Risks - - -  Follow up Falls evaluation completed - - -    ADL screen:  See questionnaire below Functional Status Survey: Is the patient deaf or have difficulty hearing?: No Does the patient have difficulty seeing, even when wearing glasses/contacts?: No Does the patient have difficulty concentrating, remembering, or making decisions?: No Does the patient have difficulty walking or climbing stairs?: No Does the patient have difficulty dressing or bathing?: Yes (both knees mostly left) Does the patient have difficulty doing errands alone such as visiting a doctor's office or shopping?: No   Review of Systems Constitutional: -, -unexpected weight change, -anorexia, -fatigue Allergy: -sneezing, -itching, -congestion Dermatology: denies changing moles, rash, lumps ENT: -runny nose, -ear pain, -sore throat,  Cardiology:  -chest pain, -palpitations, -orthopnea, Respiratory: -cough, -shortness of breath, -dyspnea on exertion, -wheezing,  Gastroenterology: -abdominal pain, -nausea, -vomiting, -diarrhea, -constipation, -dysphagia Hematology: -bleeding or bruising problems Musculoskeletal: -arthralgias, -myalgias, -joint swelling, -back pain, - Ophthalmology: -vision changes,  Urology: -dysuria, -difficulty urinating,  -  urinary frequency, -urgency, incontinence Neurology: -, -numbness, , -memory loss, -falls, -dizziness    PHYSICAL EXAM:  LMP 06/23/1990  (Approximate)   General Appearance: Alert, cooperative, no distress, appears stated age Head: Normocephalic, without obvious abnormality, atraumatic Eyes: PERRL, conjunctiva/corneas clear, EOM's intact Ears: Normal TM's and external ear canals Nose: Nares normal, mucosa normal, no drainage or sinus tenderness Throat: Lips, mucosa, and tongue normal; teeth and gums normal Neck: Supple, no lymphadenopathy;  thyroid:  no enlargement/tenderness/nodules; no carotid bruit or JVD Lungs: Clear to auscultation bilaterally without wheezes, rales or ronchi; respirations unlabored Heart: Regular rate and rhythm, S1 and S2 normal, no murmur, rubor gallop Abdomen: Soft, non-tender, nondistended, normoactive bowel sounds,  no masses, no hepatosplenomegaly Extremities: No clubbing, cyanosis or edema Pulses: 2+ and symmetric all extremities Skin:  Skin color, texture, turgor normal, no rashes or lesions Lymph nodes: Cervical, supraclavicular, and axillary nodes normal Neurologic:  CNII-XII intact, normal strength, sensation and gait; reflexes 2+ and symmetric throughout Psych: Normal mood, affect, hygiene and grooming.  ASSESSMENT/PLAN: Routine general medical examination at a health care facility  Hot flashes, menopausal  Gastroesophageal reflux disease without esophagitis - Plan: CBC with Differential/Platelet, Comprehensive metabolic panel, pantoprazole (PROTONIX) 40 MG tablet  Mild intermittent asthma without complication - Plan: CBC with Differential/Platelet, Comprehensive metabolic panel  Hyperlipidemia with target LDL less than 100 - Plan: Lipid panel, atorvastatin (LIPITOR) 20 MG tablet  Tubular adenoma of colon  History of DVT of lower extremity  Migraine with aura and without status migrainosus, not intractable  History of renal stone  History of herpes genitalis - Plan: valACYclovir (VALTREX) 500 MG tablet  Herpes I discussed options with her concerning the hot flashes.  HRT is  not going to work because of the DVT.  Discussed the use of SSRI but at this point she is not interested.  She will follow-up concerning her colonoscopy.  Continue on her migraine medications.  Discussed calling me if she has renal stone to try and avoid going to the emergency room.  Recommend she try to reduce her use of Protonix every other day or even less often than that.    Discussed at least 20 minutes of aerobic activity at least 5 days/week and weight-bearing exercise 2x/week;healthy diet, including goals of calcium and vitamin D intake a  Immunization recommendations discussed.  Colonoscopy recommendations reviewed   Medicare Attestation I have personally reviewed: The patient's medical and social history Their use of alcohol, tobacco or illicit drugs Their current medications and supplements The patient's functional ability including ADLs,fall risks, home safety risks, cognitive, and hearing and visual impairment Diet and physical activities Evidence for depression or mood disorders  The patient's weight, height, and BMI have been recorded in the chart.  I have made referrals, counseling, and provided education to the patient based on review of the above and I have provided the patient with a written personalized care plan for preventive services.     [name], MD   08/23/2020

## 2020-08-23 NOTE — Patient Instructions (Signed)
  [name] , Thank you for taking time to come for your Medicare Wellness Visit. I appreciate your ongoing commitment to your health goals. Please review the following plan we discussed and let me know if I can assist you in the future.   These are the goals we discussed: Goals   None     This is a list of the screening recommended for you and due dates:  Health Maintenance  Topic Date Due  . COVID-19 Vaccine (4 - Booster for Pfizer series) 10/03/2020  . Mammogram  05/21/2021  . Tetanus Vaccine  12/13/2028  . Colon Cancer Screening  06/27/2030  . Flu Shot  Completed  . DEXA scan (bone density measurement)  Completed  .  Hepatitis C: One time screening is recommended by Center for Disease Control  (CDC) for  adults born from [birth year] through 1963.   Completed  . Pneumonia vaccines  Completed  . HPV Vaccine  Aged Out

## 2020-08-24 LAB — COMPREHENSIVE METABOLIC PANEL
ALT: 30 IU/L (ref 0–32)
AST: 25 IU/L (ref 0–40)
Albumin/Globulin Ratio: 1.9 (ref 1.2–2.2)
Albumin: 4.8 g/dL (ref 3.8–4.8)
Alkaline Phosphatase: 95 IU/L (ref 44–121)
BUN/Creatinine Ratio: 18 (ref 12–28)
BUN: 17 mg/dL (ref 8–27)
Bilirubin Total: 0.3 mg/dL (ref 0.0–1.2)
CO2: 22 mmol/L (ref 20–29)
Calcium: 9.5 mg/dL (ref 8.7–10.3)
Chloride: 105 mmol/L (ref 96–106)
Creatinine, Ser: 0.92 mg/dL (ref 0.57–1.00)
Globulin, Total: 2.5 g/dL (ref 1.5–4.5)
Glucose: 98 mg/dL (ref 65–99)
Potassium: 4.6 mmol/L (ref 3.5–5.2)
Sodium: 143 mmol/L (ref 134–144)
Total Protein: 7.3 g/dL (ref 6.0–8.5)
eGFR: 68 mL/min/{1.73_m2} (ref >59)

## 2020-08-24 LAB — CBC WITH DIFFERENTIAL/PLATELET
Basophils Absolute: 0.1 10*3/uL (ref 0.0–0.2)
Basos: 1 %
EOS (ABSOLUTE): 0.1 10*3/uL (ref 0.0–0.4)
Eos: 1 %
Hematocrit: 46.4 % (ref 34.0–46.6)
Hemoglobin: 15.2 g/dL (ref 11.1–15.9)
Immature Grans (Abs): 0 10*3/uL (ref 0.0–0.1)
Immature Granulocytes: 0 %
Lymphocytes Absolute: 2.3 10*3/uL (ref 0.7–3.1)
Lymphs: 28 %
MCH: 28.6 pg (ref 26.6–33.0)
MCHC: 32.8 g/dL (ref 31.5–35.7)
MCV: 87 fL (ref 79–97)
Monocytes Absolute: 0.6 10*3/uL (ref 0.1–0.9)
Monocytes: 7 %
Neutrophils Absolute: 5.1 10*3/uL (ref 1.4–7.0)
Neutrophils: 63 %
Platelets: 317 10*3/uL (ref 150–450)
RBC: 5.32 x10E6/uL — ABNORMAL HIGH (ref 3.77–5.28)
RDW: 13 % (ref 11.7–15.4)
WBC: 8.1 10*3/uL (ref 3.4–10.8)

## 2020-08-24 LAB — LIPID PANEL
Chol/HDL Ratio: 3.3 ratio (ref 0.0–4.4)
Cholesterol, Total: 193 mg/dL (ref 100–199)
HDL: 59 mg/dL (ref >39)
LDL Chol Calc (NIH): 113 mg/dL — ABNORMAL HIGH (ref 0–99)
Triglycerides: 120 mg/dL (ref 0–149)
VLDL Cholesterol Cal: 21 mg/dL (ref 5–40)

## 2020-09-18 DIAGNOSIS — D3132 Benign neoplasm of left choroid: Secondary | ICD-10-CM | POA: Diagnosis not present

## 2020-10-03 DIAGNOSIS — H2513 Age-related nuclear cataract, bilateral: Secondary | ICD-10-CM | POA: Diagnosis not present

## 2020-10-03 DIAGNOSIS — D3132 Benign neoplasm of left choroid: Secondary | ICD-10-CM | POA: Diagnosis not present

## 2020-10-14 ENCOUNTER — Other Ambulatory Visit: Payer: Self-pay | Admitting: Family Medicine

## 2020-10-14 DIAGNOSIS — J4530 Mild persistent asthma, uncomplicated: Secondary | ICD-10-CM

## 2020-10-15 NOTE — Telephone Encounter (Signed)
[facility] is requesting to fill albuterol. Please advise [facility]

## 2020-10-16 MED ORDER — ALBUTEROL SULFATE HFA 108 (90 BASE) MCG/ACT IN AERS: INHALATION_SPRAY | RESPIRATORY_TRACT | 0 refills | Status: DC

## 2020-10-18 ENCOUNTER — Ambulatory Visit (INDEPENDENT_AMBULATORY_CARE_PROVIDER_SITE_OTHER): Payer: PPO

## 2020-10-18 ENCOUNTER — Other Ambulatory Visit: Payer: Self-pay

## 2020-10-18 DIAGNOSIS — Z23 Encounter for immunization: Secondary | ICD-10-CM

## 2020-12-03 DIAGNOSIS — G43719 Chronic migraine without aura, intractable, without status migrainosus: Secondary | ICD-10-CM | POA: Diagnosis not present

## 2020-12-03 DIAGNOSIS — G43111 Migraine with aura, intractable, with status migrainosus: Secondary | ICD-10-CM | POA: Diagnosis not present

## 2020-12-03 DIAGNOSIS — G43019 Migraine without aura, intractable, without status migrainosus: Secondary | ICD-10-CM | POA: Diagnosis not present

## 2021-03-07 ENCOUNTER — Other Ambulatory Visit: Payer: Self-pay

## 2021-03-07 ENCOUNTER — Encounter: Payer: Self-pay | Admitting: Family Medicine

## 2021-03-07 ENCOUNTER — Other Ambulatory Visit (INDEPENDENT_AMBULATORY_CARE_PROVIDER_SITE_OTHER): Payer: PPO

## 2021-03-07 DIAGNOSIS — Z23 Encounter for immunization: Secondary | ICD-10-CM

## 2021-04-03 DIAGNOSIS — D3132 Benign neoplasm of left choroid: Secondary | ICD-10-CM | POA: Diagnosis not present

## 2021-04-03 DIAGNOSIS — H2513 Age-related nuclear cataract, bilateral: Secondary | ICD-10-CM | POA: Diagnosis not present

## 2021-04-08 ENCOUNTER — Other Ambulatory Visit: Payer: Self-pay | Admitting: Family Medicine

## 2021-04-08 DIAGNOSIS — Z1231 Encounter for screening mammogram for malignant neoplasm of breast: Secondary | ICD-10-CM

## 2021-04-09 ENCOUNTER — Other Ambulatory Visit: Payer: Self-pay

## 2021-04-09 ENCOUNTER — Ambulatory Visit (INDEPENDENT_AMBULATORY_CARE_PROVIDER_SITE_OTHER): Payer: PPO

## 2021-04-09 DIAGNOSIS — Z23 Encounter for immunization: Secondary | ICD-10-CM

## 2021-04-11 ENCOUNTER — Encounter: Payer: Self-pay | Admitting: Family Medicine

## 2021-05-01 DIAGNOSIS — N132 Hydronephrosis with renal and ureteral calculous obstruction: Secondary | ICD-10-CM | POA: Diagnosis not present

## 2021-05-01 DIAGNOSIS — N201 Calculus of ureter: Secondary | ICD-10-CM | POA: Diagnosis not present

## 2021-05-01 DIAGNOSIS — I878 Other specified disorders of veins: Secondary | ICD-10-CM | POA: Diagnosis not present

## 2021-05-01 DIAGNOSIS — Z87442 Personal history of urinary calculi: Secondary | ICD-10-CM | POA: Diagnosis not present

## 2021-05-01 DIAGNOSIS — K8689 Other specified diseases of pancreas: Secondary | ICD-10-CM | POA: Diagnosis not present

## 2021-05-01 DIAGNOSIS — K5730 Diverticulosis of large intestine without perforation or abscess without bleeding: Secondary | ICD-10-CM | POA: Diagnosis not present

## 2021-05-01 DIAGNOSIS — R1084 Generalized abdominal pain: Secondary | ICD-10-CM | POA: Diagnosis not present

## 2021-05-02 ENCOUNTER — Inpatient Hospital Stay: Payer: Self-pay

## 2021-05-02 ENCOUNTER — Encounter (HOSPITAL_BASED_OUTPATIENT_CLINIC_OR_DEPARTMENT_OTHER): Payer: Self-pay

## 2021-05-02 ENCOUNTER — Emergency Department (HOSPITAL_COMMUNITY): Payer: PPO

## 2021-05-02 ENCOUNTER — Encounter (HOSPITAL_COMMUNITY)
Admission: EM | Disposition: A | Discharge status: Home / Self Care | Payer: Self-pay | Source: Home / Self Care | Attending: Family Medicine

## 2021-05-02 ENCOUNTER — Inpatient Hospital Stay (HOSPITAL_COMMUNITY): Payer: PPO

## 2021-05-02 ENCOUNTER — Emergency Department (HOSPITAL_COMMUNITY): Payer: PPO | Admitting: Certified Registered"

## 2021-05-02 ENCOUNTER — Inpatient Hospital Stay (HOSPITAL_COMMUNITY)
Admission: EM | Admit: 2021-05-02 | Discharge: 2021-05-07 | DRG: 853 | Disposition: A | Discharge status: Home / Self Care | Payer: PPO | Attending: Family Medicine | Admitting: Family Medicine

## 2021-05-02 ENCOUNTER — Other Ambulatory Visit: Payer: Self-pay

## 2021-05-02 ENCOUNTER — Encounter (HOSPITAL_COMMUNITY): Payer: Self-pay

## 2021-05-02 DIAGNOSIS — R7401 Elevation of levels of liver transaminase levels: Secondary | ICD-10-CM | POA: Diagnosis not present

## 2021-05-02 DIAGNOSIS — D689 Coagulation defect, unspecified: Secondary | ICD-10-CM | POA: Diagnosis not present

## 2021-05-02 DIAGNOSIS — B961 Klebsiella pneumoniae [K. pneumoniae] as the cause of diseases classified elsewhere: Secondary | ICD-10-CM

## 2021-05-02 DIAGNOSIS — A4159 Other Gram-negative sepsis: Secondary | ICD-10-CM | POA: Diagnosis not present

## 2021-05-02 DIAGNOSIS — J810 Acute pulmonary edema: Secondary | ICD-10-CM | POA: Diagnosis present

## 2021-05-02 DIAGNOSIS — E872 Acidosis, unspecified: Secondary | ICD-10-CM

## 2021-05-02 DIAGNOSIS — J9811 Atelectasis: Secondary | ICD-10-CM | POA: Diagnosis present

## 2021-05-02 DIAGNOSIS — N201 Calculus of ureter: Secondary | ICD-10-CM | POA: Diagnosis not present

## 2021-05-02 DIAGNOSIS — R0600 Dyspnea, unspecified: Secondary | ICD-10-CM | POA: Diagnosis not present

## 2021-05-02 DIAGNOSIS — Z87442 Personal history of urinary calculi: Secondary | ICD-10-CM

## 2021-05-02 DIAGNOSIS — G43909 Migraine, unspecified, not intractable, without status migrainosus: Secondary | ICD-10-CM | POA: Diagnosis present

## 2021-05-02 DIAGNOSIS — R0902 Hypoxemia: Secondary | ICD-10-CM

## 2021-05-02 DIAGNOSIS — R7303 Prediabetes: Secondary | ICD-10-CM | POA: Diagnosis present

## 2021-05-02 DIAGNOSIS — R918 Other nonspecific abnormal finding of lung field: Secondary | ICD-10-CM | POA: Diagnosis not present

## 2021-05-02 DIAGNOSIS — Z6836 Body mass index (BMI) 36.0-36.9, adult: Secondary | ICD-10-CM

## 2021-05-02 DIAGNOSIS — R0689 Other abnormalities of breathing: Secondary | ICD-10-CM | POA: Diagnosis not present

## 2021-05-02 DIAGNOSIS — N170 Acute kidney failure with tubular necrosis: Secondary | ICD-10-CM | POA: Diagnosis not present

## 2021-05-02 DIAGNOSIS — D696 Thrombocytopenia, unspecified: Secondary | ICD-10-CM

## 2021-05-02 DIAGNOSIS — N179 Acute kidney failure, unspecified: Secondary | ICD-10-CM

## 2021-05-02 DIAGNOSIS — Z885 Allergy status to narcotic agent status: Secondary | ICD-10-CM

## 2021-05-02 DIAGNOSIS — E785 Hyperlipidemia, unspecified: Secondary | ICD-10-CM | POA: Diagnosis present

## 2021-05-02 DIAGNOSIS — D6959 Other secondary thrombocytopenia: Secondary | ICD-10-CM | POA: Diagnosis present

## 2021-05-02 DIAGNOSIS — M1990 Unspecified osteoarthritis, unspecified site: Secondary | ICD-10-CM | POA: Diagnosis present

## 2021-05-02 DIAGNOSIS — Z9049 Acquired absence of other specified parts of digestive tract: Secondary | ICD-10-CM

## 2021-05-02 DIAGNOSIS — N390 Urinary tract infection, site not specified: Secondary | ICD-10-CM | POA: Diagnosis not present

## 2021-05-02 DIAGNOSIS — Z7951 Long term (current) use of inhaled steroids: Secondary | ICD-10-CM | POA: Diagnosis not present

## 2021-05-02 DIAGNOSIS — R251 Tremor, unspecified: Secondary | ICD-10-CM | POA: Diagnosis not present

## 2021-05-02 DIAGNOSIS — Z85828 Personal history of other malignant neoplasm of skin: Secondary | ICD-10-CM

## 2021-05-02 DIAGNOSIS — R040 Epistaxis: Secondary | ICD-10-CM

## 2021-05-02 DIAGNOSIS — N136 Pyonephrosis: Secondary | ICD-10-CM | POA: Diagnosis present

## 2021-05-02 DIAGNOSIS — E669 Obesity, unspecified: Secondary | ICD-10-CM | POA: Diagnosis not present

## 2021-05-02 DIAGNOSIS — J9601 Acute respiratory failure with hypoxia: Secondary | ICD-10-CM | POA: Diagnosis not present

## 2021-05-02 DIAGNOSIS — Z91018 Allergy to other foods: Secondary | ICD-10-CM | POA: Diagnosis not present

## 2021-05-02 DIAGNOSIS — R7881 Bacteremia: Secondary | ICD-10-CM

## 2021-05-02 DIAGNOSIS — R6520 Severe sepsis without septic shock: Secondary | ICD-10-CM

## 2021-05-02 DIAGNOSIS — N200 Calculus of kidney: Secondary | ICD-10-CM | POA: Diagnosis not present

## 2021-05-02 DIAGNOSIS — A419 Sepsis, unspecified organism: Secondary | ICD-10-CM | POA: Diagnosis present

## 2021-05-02 DIAGNOSIS — K219 Gastro-esophageal reflux disease without esophagitis: Secondary | ICD-10-CM | POA: Diagnosis not present

## 2021-05-02 DIAGNOSIS — Z91041 Radiographic dye allergy status: Secondary | ICD-10-CM | POA: Diagnosis not present

## 2021-05-02 DIAGNOSIS — D649 Anemia, unspecified: Secondary | ICD-10-CM | POA: Diagnosis not present

## 2021-05-02 DIAGNOSIS — D62 Acute posthemorrhagic anemia: Secondary | ICD-10-CM | POA: Diagnosis not present

## 2021-05-02 DIAGNOSIS — G43109 Migraine with aura, not intractable, without status migrainosus: Secondary | ICD-10-CM | POA: Diagnosis not present

## 2021-05-02 DIAGNOSIS — Z79899 Other long term (current) drug therapy: Secondary | ICD-10-CM | POA: Diagnosis not present

## 2021-05-02 DIAGNOSIS — R197 Diarrhea, unspecified: Secondary | ICD-10-CM | POA: Diagnosis not present

## 2021-05-02 DIAGNOSIS — N202 Calculus of kidney with calculus of ureter: Secondary | ICD-10-CM | POA: Diagnosis not present

## 2021-05-02 DIAGNOSIS — R000 Tachycardia, unspecified: Secondary | ICD-10-CM | POA: Diagnosis not present

## 2021-05-02 DIAGNOSIS — R6521 Severe sepsis with septic shock: Secondary | ICD-10-CM | POA: Diagnosis not present

## 2021-05-02 DIAGNOSIS — E66811 Obesity, class 1: Secondary | ICD-10-CM

## 2021-05-02 DIAGNOSIS — N1330 Unspecified hydronephrosis: Secondary | ICD-10-CM | POA: Diagnosis not present

## 2021-05-02 DIAGNOSIS — B9689 Other specified bacterial agents as the cause of diseases classified elsewhere: Secondary | ICD-10-CM | POA: Diagnosis not present

## 2021-05-02 DIAGNOSIS — K449 Diaphragmatic hernia without obstruction or gangrene: Secondary | ICD-10-CM | POA: Diagnosis not present

## 2021-05-02 DIAGNOSIS — R509 Fever, unspecified: Secondary | ICD-10-CM | POA: Diagnosis not present

## 2021-05-02 DIAGNOSIS — N10 Acute tubulo-interstitial nephritis: Secondary | ICD-10-CM | POA: Diagnosis not present

## 2021-05-02 DIAGNOSIS — N139 Obstructive and reflux uropathy, unspecified: Secondary | ICD-10-CM

## 2021-05-02 DIAGNOSIS — R739 Hyperglycemia, unspecified: Secondary | ICD-10-CM | POA: Diagnosis not present

## 2021-05-02 DIAGNOSIS — Z90710 Acquired absence of both cervix and uterus: Secondary | ICD-10-CM | POA: Diagnosis not present

## 2021-05-02 DIAGNOSIS — J4520 Mild intermittent asthma, uncomplicated: Secondary | ICD-10-CM | POA: Diagnosis present

## 2021-05-02 DIAGNOSIS — F40240 Claustrophobia: Secondary | ICD-10-CM | POA: Diagnosis present

## 2021-05-02 DIAGNOSIS — Z20822 Contact with and (suspected) exposure to covid-19: Secondary | ICD-10-CM | POA: Diagnosis not present

## 2021-05-02 DIAGNOSIS — I517 Cardiomegaly: Secondary | ICD-10-CM | POA: Diagnosis not present

## 2021-05-02 DIAGNOSIS — Z86718 Personal history of other venous thrombosis and embolism: Secondary | ICD-10-CM

## 2021-05-02 DIAGNOSIS — N132 Hydronephrosis with renal and ureteral calculous obstruction: Secondary | ICD-10-CM | POA: Diagnosis not present

## 2021-05-02 HISTORY — PX: CYSTOSCOPY/URETEROSCOPY/HOLMIUM LASER/STENT PLACEMENT: SHX6546

## 2021-05-02 LAB — URINALYSIS, ROUTINE W REFLEX MICROSCOPIC
Bilirubin Urine: NEGATIVE
Glucose, UA: NEGATIVE mg/dL
Ketones, ur: NEGATIVE mg/dL
Nitrite: NEGATIVE
Protein, ur: 30 mg/dL — AB
RBC / HPF: >50 RBC/hpf — ABNORMAL HIGH (ref 0–5)
Specific Gravity, Urine: 1.017 (ref 1.005–1.030)
WBC, UA: >50 WBC/hpf — ABNORMAL HIGH (ref 0–5)
pH: 5 (ref 5.0–8.0)

## 2021-05-02 LAB — GLUCOSE, CAPILLARY
Glucose-Capillary: 105 mg/dL — ABNORMAL HIGH (ref 70–99)
Glucose-Capillary: 129 mg/dL — ABNORMAL HIGH (ref 70–99)

## 2021-05-02 LAB — COMPREHENSIVE METABOLIC PANEL
ALT: 246 U/L — ABNORMAL HIGH (ref 0–44)
AST: 219 U/L — ABNORMAL HIGH (ref 15–41)
Albumin: 3.6 g/dL (ref 3.5–5.0)
Alkaline Phosphatase: 127 U/L — ABNORMAL HIGH (ref 38–126)
Anion gap: 13 (ref 5–15)
BUN: 41 mg/dL — ABNORMAL HIGH (ref 8–23)
CO2: 18 mmol/L — ABNORMAL LOW (ref 22–32)
Calcium: 8.2 mg/dL — ABNORMAL LOW (ref 8.9–10.3)
Chloride: 112 mmol/L — ABNORMAL HIGH (ref 98–111)
Creatinine, Ser: 3.99 mg/dL — ABNORMAL HIGH (ref 0.44–1.00)
GFR, Estimated: 12 mL/min — ABNORMAL LOW (ref >60)
Glucose, Bld: 113 mg/dL — ABNORMAL HIGH (ref 70–99)
Potassium: 4.1 mmol/L (ref 3.5–5.1)
Sodium: 143 mmol/L (ref 135–145)
Total Bilirubin: 0.9 mg/dL (ref 0.3–1.2)
Total Protein: 6.6 g/dL (ref 6.5–8.1)

## 2021-05-02 LAB — RESP PANEL BY RT-PCR (FLU A&B, COVID) ARPGX2
Influenza A by PCR: NEGATIVE
Influenza B by PCR: NEGATIVE
SARS Coronavirus 2 by RT PCR: NEGATIVE

## 2021-05-02 LAB — LACTIC ACID, PLASMA
Lactic Acid, Venous: 3.4 mmol/L (ref 0.5–1.9)
Lactic Acid, Venous: 2.8 mmol/L (ref 0.5–1.9)
Lactic Acid, Venous: 2.5 mmol/L (ref 0.5–1.9)
Lactic Acid, Venous: 1.7 mmol/L (ref 0.5–1.9)

## 2021-05-02 LAB — CBC WITH DIFFERENTIAL/PLATELET
Abs Immature Granulocytes: 0.86 10*3/uL — ABNORMAL HIGH (ref 0.00–0.07)
Basophils Absolute: 0.1 10*3/uL (ref 0.0–0.1)
Basophils Relative: 0 %
Eosinophils Absolute: 0 10*3/uL (ref 0.0–0.5)
Eosinophils Relative: 0 %
HCT: 39.9 % (ref 36.0–46.0)
Hemoglobin: 13 g/dL (ref 12.0–15.0)
Immature Granulocytes: 4 %
Lymphocytes Relative: 3 %
Lymphs Abs: 0.6 10*3/uL — ABNORMAL LOW (ref 0.7–4.0)
MCH: 29.5 pg (ref 26.0–34.0)
MCHC: 32.6 g/dL (ref 30.0–36.0)
MCV: 90.5 fL (ref 80.0–100.0)
Monocytes Absolute: 0.8 10*3/uL (ref 0.1–1.0)
Monocytes Relative: 4 %
Neutro Abs: 19.4 10*3/uL — ABNORMAL HIGH (ref 1.7–7.7)
Neutrophils Relative %: 89 %
Platelets: 125 10*3/uL — ABNORMAL LOW (ref 150–400)
RBC: 4.41 MIL/uL (ref 3.87–5.11)
RDW: 13.1 % (ref 11.5–15.5)
WBC: 21.8 10*3/uL — ABNORMAL HIGH (ref 4.0–10.5)
nRBC: 0 % (ref 0.0–0.2)

## 2021-05-02 LAB — PROTIME-INR
INR: 1.4 — ABNORMAL HIGH (ref 0.8–1.2)
Prothrombin Time: 17 seconds — ABNORMAL HIGH (ref 11.4–15.2)

## 2021-05-02 LAB — MRSA NEXT GEN BY PCR, NASAL: MRSA by PCR Next Gen: NOT DETECTED

## 2021-05-02 LAB — APTT: aPTT: 38 seconds — ABNORMAL HIGH (ref 24–36)

## 2021-05-02 SURGERY — CYSTOSCOPY/URETEROSCOPY/HOLMIUM LASER/STENT PLACEMENT
Anesthesia: General | Laterality: Left

## 2021-05-02 MED ORDER — NOREPINEPHRINE 4 MG/250ML-% IV SOLN
2.0000 ug/min | INTRAVENOUS | Status: DC
  Administered 2021-05-02: 2 ug/min via INTRAVENOUS
  Filled 2021-05-02: qty 250

## 2021-05-02 MED ORDER — DOCUSATE SODIUM 100 MG PO CAPS: 100.0000 mg | ORAL_CAPSULE | Freq: Two times a day (BID) | ORAL | Status: DC | PRN

## 2021-05-02 MED ORDER — LACTATED RINGERS IV SOLN: INTRAVENOUS | Status: DC

## 2021-05-02 MED ORDER — LACTATED RINGERS IV BOLUS
1000.0000 mL | Freq: Once | INTRAVENOUS | Status: AC
  Administered 2021-05-02: 1000 mL via INTRAVENOUS

## 2021-05-02 MED ORDER — FENTANYL CITRATE (PF) 100 MCG/2ML IJ SOLN
25.0000 ug | INTRAMUSCULAR | Status: DC | PRN
Start: 2021-05-02 — End: 2021-05-07

## 2021-05-02 MED ORDER — CEFEPIME HCL 2 G IJ SOLR: 2.0000 g | INTRAMUSCULAR | Status: DC

## 2021-05-02 MED ORDER — ORAL CARE MOUTH RINSE
15.0000 mL | Freq: Two times a day (BID) | OROMUCOSAL | Status: DC
  Administered 2021-05-02 – 2021-05-07 (×8): 15 mL via OROMUCOSAL

## 2021-05-02 MED ORDER — VANCOMYCIN VARIABLE DOSE PER UNSTABLE RENAL FUNCTION (PHARMACIST DOSING): Status: DC

## 2021-05-02 MED ORDER — OXYCODONE HCL 5 MG PO TABS
5.0000 mg | ORAL_TABLET | Freq: Four times a day (QID) | ORAL | Status: DC | PRN
  Administered 2021-05-02: 5 mg via ORAL
  Administered 2021-05-04: 10 mg via ORAL
  Administered 2021-05-06: 5 mg via ORAL
  Administered 2021-05-07: 10 mg via ORAL
  Filled 2021-05-02: qty 2
  Filled 2021-05-02: qty 1
  Filled 2021-05-02 (×2): qty 2
  Filled 2021-05-02: qty 1

## 2021-05-02 MED ORDER — DEXAMETHASONE SODIUM PHOSPHATE 10 MG/ML IJ SOLN
INTRAMUSCULAR | Status: AC
  Filled 2021-05-02: qty 1

## 2021-05-02 MED ORDER — ONDANSETRON HCL 4 MG/2ML IJ SOLN
INTRAMUSCULAR | Status: DC | PRN
  Administered 2021-05-02: 4 mg via INTRAVENOUS

## 2021-05-02 MED ORDER — VANCOMYCIN HCL 1250 MG/250ML IV SOLN
1250.0000 mg | Freq: Once | INTRAVENOUS | Status: AC
  Administered 2021-05-02: 1250 mg via INTRAVENOUS
  Filled 2021-05-02: qty 250

## 2021-05-02 MED ORDER — PHENYLEPHRINE HCL-NACL 20-0.9 MG/250ML-% IV SOLN
INTRAVENOUS | Status: DC | PRN
  Administered 2021-05-02: 20 ug/min via INTRAVENOUS

## 2021-05-02 MED ORDER — PANTOPRAZOLE SODIUM 40 MG PO TBEC
40.0000 mg | DELAYED_RELEASE_TABLET | Freq: Every day | ORAL | Status: DC
  Administered 2021-05-02 – 2021-05-07 (×6): 40 mg via ORAL
  Filled 2021-05-02 (×7): qty 1

## 2021-05-02 MED ORDER — CHLORHEXIDINE GLUCONATE 0.12 % MT SOLN
15.0000 mL | Freq: Once | OROMUCOSAL | Status: AC
  Administered 2021-05-02: 15 mL via OROMUCOSAL

## 2021-05-02 MED ORDER — SODIUM CHLORIDE 0.9 % IV SOLN
2.0000 g | INTRAVENOUS | Status: AC
  Administered 2021-05-02: 2 g via INTRAVENOUS
  Filled 2021-05-02: qty 2

## 2021-05-02 MED ORDER — ETOMIDATE 2 MG/ML IV SOLN
INTRAVENOUS | Status: AC
  Filled 2021-05-02: qty 10

## 2021-05-02 MED ORDER — ROCURONIUM BROMIDE 10 MG/ML (PF) SYRINGE
PREFILLED_SYRINGE | INTRAVENOUS | Status: DC | PRN
  Administered 2021-05-02: 30 mg via INTRAVENOUS

## 2021-05-02 MED ORDER — TAMSULOSIN HCL 0.4 MG PO CAPS
0.4000 mg | ORAL_CAPSULE | Freq: Every day | ORAL | Status: DC
  Administered 2021-05-02 – 2021-05-06 (×5): 0.4 mg via ORAL
  Filled 2021-05-02 (×5): qty 1

## 2021-05-02 MED ORDER — ACETAMINOPHEN 10 MG/ML IV SOLN: 1000.0000 mg | INTRAVENOUS | Status: AC

## 2021-05-02 MED ORDER — HEPARIN SODIUM (PORCINE) 5000 UNIT/ML IJ SOLN
5000.0000 [IU] | Freq: Three times a day (TID) | INTRAMUSCULAR | Status: DC
  Administered 2021-05-02 – 2021-05-03 (×2): 5000 [IU] via SUBCUTANEOUS
  Filled 2021-05-02 (×2): qty 1

## 2021-05-02 MED ORDER — LACTATED RINGERS IV BOLUS (SEPSIS)
1000.0000 mL | Freq: Once | INTRAVENOUS | Status: AC
  Administered 2021-05-02: 1000 mL via INTRAVENOUS

## 2021-05-02 MED ORDER — ACETAMINOPHEN 10 MG/ML IV SOLN
INTRAVENOUS | Status: AC
  Administered 2021-05-02: 1000 mg via INTRAVENOUS
  Filled 2021-05-02: qty 100

## 2021-05-02 MED ORDER — LACTATED RINGERS IV BOLUS: 500.0000 mL | Freq: Once | INTRAVENOUS | Status: DC

## 2021-05-02 MED ORDER — CHLORHEXIDINE GLUCONATE CLOTH 2 % EX PADS
6.0000 | MEDICATED_PAD | Freq: Every day | CUTANEOUS | Status: DC
  Administered 2021-05-02 – 2021-05-06 (×5): 6 via TOPICAL

## 2021-05-02 MED ORDER — DEXAMETHASONE SODIUM PHOSPHATE 10 MG/ML IJ SOLN
INTRAMUSCULAR | Status: DC | PRN
  Administered 2021-05-02: 4 mg via INTRAVENOUS

## 2021-05-02 MED ORDER — SUCCINYLCHOLINE CHLORIDE 200 MG/10ML IV SOSY
PREFILLED_SYRINGE | INTRAVENOUS | Status: DC | PRN
  Administered 2021-05-02: 100 mg via INTRAVENOUS

## 2021-05-02 MED ORDER — FENTANYL CITRATE (PF) 100 MCG/2ML IJ SOLN
INTRAMUSCULAR | Status: AC
  Filled 2021-05-02: qty 2

## 2021-05-02 MED ORDER — LIDOCAINE HCL (PF) 2 % IJ SOLN
INTRAMUSCULAR | Status: AC
  Filled 2021-05-02: qty 5

## 2021-05-02 MED ORDER — PROPOFOL 10 MG/ML IV BOLUS
INTRAVENOUS | Status: AC
  Filled 2021-05-02: qty 20

## 2021-05-02 MED ORDER — ALBUTEROL SULFATE HFA 108 (90 BASE) MCG/ACT IN AERS
INHALATION_SPRAY | RESPIRATORY_TRACT | Status: AC
  Filled 2021-05-02: qty 6.7

## 2021-05-02 MED ORDER — PHENYLEPHRINE 40 MCG/ML (10ML) SYRINGE FOR IV PUSH (FOR BLOOD PRESSURE SUPPORT)
PREFILLED_SYRINGE | INTRAVENOUS | Status: DC | PRN
  Administered 2021-05-02: 80 ug via INTRAVENOUS
  Administered 2021-05-02: 160 ug via INTRAVENOUS
  Administered 2021-05-02: 80 ug via INTRAVENOUS

## 2021-05-02 MED ORDER — ETOMIDATE 2 MG/ML IV SOLN
INTRAVENOUS | Status: DC | PRN
  Administered 2021-05-02: 10 mg via INTRAVENOUS

## 2021-05-02 MED ORDER — FENTANYL CITRATE PF 50 MCG/ML IJ SOSY: 25.0000 ug | PREFILLED_SYRINGE | INTRAMUSCULAR | Status: DC | PRN

## 2021-05-02 MED ORDER — ONDANSETRON HCL 4 MG/2ML IJ SOLN
INTRAMUSCULAR | Status: AC
  Filled 2021-05-02: qty 2

## 2021-05-02 MED ORDER — INSULIN ASPART 100 UNIT/ML IJ SOLN
1.0000 [IU] | INTRAMUSCULAR | Status: DC
Start: 2021-05-02 — End: 2021-05-02

## 2021-05-02 MED ORDER — LACTATED RINGERS IV SOLN
INTRAVENOUS | Status: DC
  Administered 2021-05-02: 500 mL via INTRAVENOUS

## 2021-05-02 MED ORDER — LACTATED RINGERS IV BOLUS (SEPSIS)
250.0000 mL | Freq: Once | INTRAVENOUS | Status: AC
  Administered 2021-05-02: 1000 mL via INTRAVENOUS

## 2021-05-02 MED ORDER — POLYETHYLENE GLYCOL 3350 17 G PO PACK: 17.0000 g | PACK | Freq: Every day | ORAL | Status: DC | PRN

## 2021-05-02 MED ORDER — ATORVASTATIN CALCIUM 10 MG PO TABS
20.0000 mg | ORAL_TABLET | Freq: Every day | ORAL | Status: DC
  Administered 2021-05-02 – 2021-05-07 (×6): 20 mg via ORAL
  Filled 2021-05-02 (×7): qty 2

## 2021-05-02 MED ORDER — PHENYLEPHRINE HCL (PRESSORS) 10 MG/ML IV SOLN
INTRAVENOUS | Status: AC
  Filled 2021-05-02: qty 2

## 2021-05-02 MED ORDER — ZONISAMIDE 100 MG PO CAPS: 100.0000 mg | ORAL_CAPSULE | Freq: Two times a day (BID) | ORAL | Status: DC

## 2021-05-02 MED ORDER — ALBUTEROL SULFATE (2.5 MG/3ML) 0.083% IN NEBU
3.0000 mL | INHALATION_SOLUTION | RESPIRATORY_TRACT | Status: DC | PRN
  Administered 2021-05-05 – 2021-05-06 (×3): 3 mL via RESPIRATORY_TRACT
  Filled 2021-05-02 (×3): qty 3

## 2021-05-02 MED ORDER — SODIUM CHLORIDE 0.9 % IV SOLN
250.0000 mL | INTRAVENOUS | Status: DC
  Administered 2021-05-02: 250 mL via INTRAVENOUS

## 2021-05-02 MED ORDER — ALBUTEROL SULFATE (2.5 MG/3ML) 0.083% IN NEBU: 2.5000 mg | INHALATION_SOLUTION | RESPIRATORY_TRACT | Status: DC

## 2021-05-02 MED ORDER — SODIUM CHLORIDE 0.9 % IV SOLN: 250.0000 mL | INTRAVENOUS | Status: DC

## 2021-05-02 MED ORDER — PHENYLEPHRINE HCL-NACL 20-0.9 MG/250ML-% IV SOLN: 25.0000 ug/min | INTRAVENOUS | Status: DC

## 2021-05-02 MED ORDER — INSULIN ASPART 100 UNIT/ML IJ SOLN
1.0000 [IU] | INTRAMUSCULAR | Status: DC
Start: 2021-05-02 — End: 2021-05-06
  Administered 2021-05-03 (×2): 1 [IU] via SUBCUTANEOUS
  Administered 2021-05-04 – 2021-05-05 (×2): 2 [IU] via SUBCUTANEOUS
  Administered 2021-05-05 – 2021-05-06 (×2): 1 [IU] via SUBCUTANEOUS
  Administered 2021-05-06 (×2): 2 [IU] via SUBCUTANEOUS

## 2021-05-02 MED ORDER — ONDANSETRON HCL 4 MG/2ML IJ SOLN
4.0000 mg | Freq: Four times a day (QID) | INTRAMUSCULAR | Status: DC | PRN
  Administered 2021-05-03 – 2021-05-07 (×5): 4 mg via INTRAVENOUS
  Filled 2021-05-02 (×5): qty 2

## 2021-05-02 MED ORDER — SODIUM CHLORIDE 0.9 % IV SOLN
2.0000 g | INTRAVENOUS | Status: DC
  Administered 2021-05-02: 2 g via INTRAVENOUS
  Filled 2021-05-02: qty 20

## 2021-05-02 MED ORDER — SUGAMMADEX SODIUM 200 MG/2ML IV SOLN
INTRAVENOUS | Status: DC | PRN
  Administered 2021-05-02: 140 mg via INTRAVENOUS

## 2021-05-02 MED ORDER — MIDAZOLAM HCL 2 MG/2ML IJ SOLN
INTRAMUSCULAR | Status: AC
  Filled 2021-05-02: qty 2

## 2021-05-02 MED ORDER — ALBUTEROL SULFATE (2.5 MG/3ML) 0.083% IN NEBU
INHALATION_SOLUTION | RESPIRATORY_TRACT | Status: AC
  Filled 2021-05-02: qty 3

## 2021-05-02 MED ORDER — FENTANYL CITRATE (PF) 250 MCG/5ML IJ SOLN
INTRAMUSCULAR | Status: DC | PRN
  Administered 2021-05-02: 25 ug via INTRAVENOUS

## 2021-05-02 MED ORDER — SODIUM CHLORIDE 0.9 % IV SOLN
6.2500 mg | Freq: Four times a day (QID) | INTRAVENOUS | Status: DC | PRN
  Administered 2021-05-03: 6.25 mg via INTRAVENOUS
  Filled 2021-05-02 (×5): qty 0.25

## 2021-05-02 SURGICAL SUPPLY — 24 items
BAG URO CATCHER STRL LF (MISCELLANEOUS) ×2 IMPLANT
BASKET ZERO TIP NITINOL 2.4FR (BASKET) ×1 IMPLANT
BSKT STON RTRVL ZERO TP 2.4FR (BASKET) ×1
CATH URET 5FR 28IN CONE TIP (BALLOONS)
CATH URET 5FR 28IN OPEN ENDED (CATHETERS) ×1 IMPLANT
CATH URET 5FR 70CM CONE TIP (BALLOONS) IMPLANT
CATH URETL OPEN END 6FR 70 (CATHETERS) ×2 IMPLANT
CLOTH BEACON ORANGE TIMEOUT ST (SAFETY) ×2 IMPLANT
GLOVE SURG ENC MOIS LTX SZ7.5 (GLOVE) ×2 IMPLANT
GOWN STRL REUS W/TWL XL LVL3 (GOWN DISPOSABLE) ×2 IMPLANT
GUIDEWIRE ANG ZIPWIRE 038X150 (WIRE) ×2 IMPLANT
GUIDEWIRE STR DUAL SENSOR (WIRE) ×2 IMPLANT
KIT TURNOVER KIT A (KITS) IMPLANT
LASER FIB FLEXIVA PULSE ID 365 (Laser) IMPLANT
MANIFOLD NEPTUNE II (INSTRUMENTS) ×2 IMPLANT
PACK CYSTO (CUSTOM PROCEDURE TRAY) ×2 IMPLANT
SHEATH NAVIGATOR HD 11/13X28 (SHEATH) IMPLANT
SHEATH NAVIGATOR HD 11/13X36 (SHEATH) IMPLANT
STENT URET 6FRX24 CONTOUR (STENTS) ×1 IMPLANT
TRACTIP FLEXIVA PULS ID 200XHI (Laser) IMPLANT
TRACTIP FLEXIVA PULSE ID 200 (Laser) ×2
TRAY FOLEY MTR SLVR 16FR STAT (SET/KITS/TRAYS/PACK) ×2 IMPLANT
TUBING CONNECTING 10 (TUBING) ×2 IMPLANT
TUBING UROLOGY SET (TUBING) ×2 IMPLANT

## 2021-05-02 NOTE — ED Notes (Signed)
Short Stay at the bedside.

## 2021-05-02 NOTE — ED Notes (Signed)
US at the bedside

## 2021-05-02 NOTE — Sepsis Progress Note (Signed)
Per ER bedside RN, repeat LA did not get sent to lab before procedure. Pt is currently in short stay. I contacted SS and spoke with Bethena Roys who told the pt's RN that pt needed a repeat LA drawn stat. Able to hear RN and confirmed she received message. Order is still in for LA.

## 2021-05-02 NOTE — Progress Notes (Signed)
Patient status post cystoscopy with left ureteroscopy, laser lithotripsy and stone removal.  I placed a left ureteral stent and taped the string to the Foley.  Continue Foley catheter for 24 to 48 hours and then it is okay to remove the Foley once patient stable.  The stent will also be pulled out by design.  Please page GU on-call with any questions, concerns or changes in patient's status.  I also called and discussed the patient, procedure, postop care and follow-up with Madaline Savage.

## 2021-05-02 NOTE — Progress Notes (Signed)
[facility] was called regarding the increase in Levophed, peripheral IV and need for more pressure support.

## 2021-05-02 NOTE — Progress Notes (Addendum)
Pharmacy asked to change ceftriaxone to cefepime.  See previous progress note from Royetta Asal, PharmD.  Based on CrCl 11 ml/min, will begin Cefepime 2g IV q24h and adjust renal function as needed.  Peggyann Juba, PharmD, BCPS 05/02/2021 3:08 PM Pharmacy: [phone]

## 2021-05-02 NOTE — Anesthesia Postprocedure Evaluation (Signed)
Anesthesia Post Note  Patient: Carol [name]  Procedure(s) Performed: CYSTOSCOPY/URETEROSCOPY/HOLMIUM LASER/STENT PLACEMENT (Left)     Patient location during evaluation: PACU Anesthesia Type: General Level of consciousness: awake and alert Pain management: pain level controlled Vital Signs Assessment: post-procedure vital signs reviewed and stable Respiratory status: spontaneous breathing, nonlabored ventilation, respiratory function stable and patient connected to nasal cannula oxygen Cardiovascular status: blood pressure returned to baseline and stable Postop Assessment: no apparent nausea or vomiting Anesthetic complications: no   No notable events documented.  Last Vitals:  Vitals:   05/02/21 1800 05/02/21 1810  BP: 109/60 94/66  Pulse: (!) 118 (!) 114  Resp: (!) 23 (!) 22  Temp:  36.7 C  SpO2: 94% 94%    Last Pain:  Vitals:   05/02/21 1810  TempSrc:   PainSc: 0-No pain                 [place] Mielke,W. EDMOND

## 2021-05-02 NOTE — ED Notes (Signed)
Pulmonary at the bedside.

## 2021-05-02 NOTE — ED Notes (Signed)
EDP notified of pt's hypotensive BP. EDP at the bedside. NS bolus running at this time.

## 2021-05-02 NOTE — Sepsis Progress Note (Signed)
Secure chat to bedside RN at this time to verify repeat LA was drawn and sent to lab. Chart review states repeat LA was collected at 1401 but is yet to be in process at 1500.

## 2021-05-02 NOTE — ED Triage Notes (Signed)
Pt presents to the ED via EMS from home. Per EMS, pt was diagnosed with kidney stone one week ago. Pt reports onset of diarrhea, weakness and was seen by her PCP yesterday however she was sent home. Per EMS pt as diagnosed with UTI a few months ago.

## 2021-05-02 NOTE — Consult Note (Addendum)
Consult: Left distal ureteral stone, sepsis Requested by: Dr. Davonna Belling  History of Present Illness: Carol [name] is a 70 year old female well-known to our service.  She follows with Dr. Tresa Moore.  She has a history of kidney stones.  She was seen in the office yesterday with pain and nausea and a 4 mm left distal stone.  She was given NSAIDs and promethazine and felt better.  She then began feeling poorly last night and presented to the ER today.  She continues to have left flank pain and has not seen a stone pass.  Her white count is 21.8 and creatinine 3.99.  Renal ultrasound was obtained which shows no hydronephrosis.  UA with many bacteria, greater than 50 reds and greater than 50 whites.  Lactic acid 3.4.  She was brought for cystoscopy left retrograde pyelogram left ureteral stent placement.  Because the stone was distal and there is no hydro and might be worth a quick look with the ureteroscope to determine if stone has passed or not.  We then may leave a stent with a Foley.  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    Tubular adenoma of colon 06/29/2020   Past  Surgical History:  Procedure Laterality Date   [place]   CHOLECYSTECTOMY N/A 09/02/2013   Procedure: LAPAROSCOPIC CHOLECYSTECTOMY;  Surgeon: Harl Bowie, MD;  Location: [place];  Service: General;  Laterality: N/A;   CRYOTHERAPY     for abnormal pap smear in 20's   CYSTOSCOPY W/ URETERAL STENT PLACEMENT  03/05/2012   Procedure: CYSTOSCOPY WITH RETROGRADE PYELOGRAM/URETERAL STENT PLACEMENT;  Surgeon: Hanley Ben, MD;  Location: [place];  Service: Urology;  Laterality: Right;   CYSTOSCOPY W/ URETERAL STENT PLACEMENT Right 01/19/2015   Procedure: CYSTOSCOPY WITH RETROGRADE PYELOGRAM/URETERAL  RIGHT STENT PLACEMENT;  Surgeon: Alexis Frock, MD;  Location: [facility];  Service: Urology;  Laterality: Right;   CYSTOSCOPY WITH RETROGRADE PYELOGRAM, URETEROSCOPY AND STENT PLACEMENT Right 09/08/2014   Procedure: CYSTOSCOPY WITH RIGHT  RETROGRADE PYELOGRAM, URETEROSCOPY AND RIGHT STENT PLACEMENT;  Surgeon: Raynelle Bring, MD;  Location: [facility];  Service: Urology;  Laterality: Right;   CYSTOSCOPY WITH RETROGRADE PYELOGRAM, URETEROSCOPY AND STENT PLACEMENT Right 10/19/2014   Procedure: CYSTOSCOPY WITH Bilateral RETROGRADE PYELOGRAM/RIGHT URETEROSCOPY,RIGHT JJ STENT;  Surgeon: Carolan Clines, MD;  Location: [facility];  Service: Urology;  Laterality: Right;   CYSTOSCOPY/RETROGRADE/URETEROSCOPY  11/18/2011   Procedure: CYSTOSCOPY/RETROGRADE/URETEROSCOPY;  Surgeon: Ailene Rud, MD;  Location: [facility];  Service: Urology;  Laterality: Right;   CYSTOSCOPY/RETROGRADE/URETEROSCOPY/STONE EXTRACTION WITH BASKET  10/21/2011   Procedure: CYSTOSCOPY/RETROGRADE/URETEROSCOPY/STONE EXTRACTION WITH BASKET;  Surgeon: Ailene Rud, MD;  Location: [facility];  Service: Urology;  Laterality: Right;   CYSTOSCOPY/RETROGRADE/URETEROSCOPY/STONE EXTRACTION WITH BASKET Right 09/10/2012   Procedure: RIGHT URETEROSCOPY/STONE EXTRACTION/LASER  LITHOTRIPSY/POSSIBLE DOUBLE J STENT PLACEMENT;  Surgeon: Ailene Rud, MD;  Location: [facility];  Service: Urology;  Laterality: Right;   MOHS SURGERY  2008; 2014   PLANTAR FASCIA SURGERY  05/2016   POSTERIOR PINNACLE MESH SACROSPINOUS REPAIR  11-22-2010   RECURRENT ENTEROCELE   RIGHT URETEROSCOPIC STONE EXTRACTION W/ STENT PLACEMENT  08-12-2011   x2 stents   ROBOTICALLY ASSISTED LAPAROSCOPIC URETERAL RE-IMPLANTATION Right 01/19/2015   Procedure: ROBOTICALLY ASSISTED LAPAROSCOPIC URETERAL RE-IMPLANTATION RIGHT;  Surgeon: Alexis Frock, MD;  Location: [facility];  Service: Urology;  Laterality: Right;   TUBAL LIGATION  1991   VAULT SUSPENSION (DERMAL GRAFT)/ ENTEROCELE AND RECTOCELE REPAIR  12-05-2005   VAULT PROLAPSE/ ENTERCELE/ RECTOCELE    Home Medications:  Medications Prior to Admission  Medication Sig Dispense Refill Last Dose   atorvastatin (LIPITOR) 20 MG tablet Take 1 tablet (20 mg total) by mouth daily. 90 tablet 3 05/01/2021   beclomethasone (QVAR REDIHALER) 40 MCG/ACT inhaler Inhale 2 puffs into the lungs 2 (two) times daily. (Patient taking differently: Inhale 2 puffs into the lungs 2 (two) times daily as needed (shortness of Breath).) 10.6 g 3 unk   diclofenac sodium (VOLTAREN) 1 % GEL Apply 2 g topically 4 (four) times daily.   Past Week   ketorolac (TORADOL) 10 MG tablet Take 10 mg by mouth every 8 (eight) hours as needed for pain.   05/02/2021 at 0800   methocarbamol (ROBAXIN) 500 MG tablet Take 500 mg by mouth daily as needed (migraine).   Past Week   ondansetron (ZOFRAN) 4 MG tablet Take 4 mg by mouth every 8 (eight) hours as needed for nausea/vomiting.   05/02/2021   pantoprazole (PROTONIX) 40 MG tablet Take 1 tablet (40 mg total) by mouth daily. 90 tablet 3 05/01/2021   tamsulosin (FLOMAX) 0.4 MG CAPS capsule Take 0.4 mg by mouth at bedtime.   05/01/2021   valACYclovir (VALTREX) 500 MG tablet Take 1 tablet (500 mg total) by mouth daily. (Patient taking differently: Take 500  mg by mouth daily as needed (flare up).) 90 tablet 4 unk   zonisamide (ZONEGRAN) 100 MG capsule Take 100 mg by mouth 2 (two) times daily.   05/01/2021   albuterol (PROAIR HFA) 108 (90 Base) MCG/ACT inhaler INHALE 2 PUFFS INTO THE LUNGS EVERY 6 HOURS AS NEEDED FOR WHEEZING/ SHORTNESS OF BREATH. (Patient taking differently: Inhale 2 puffs into the lungs every 6 (six) hours as needed for wheezing or shortness of breath.) 8.5 g 0 unk   HYDROcodone-acetaminophen (NORCO/VICODIN) 5-325 MG tablet Take 1-2 tablets by mouth every 6 (six) hours as needed for pain.      Allergies:  Allergies  Allergen Reactions   Iohexol Anaphylaxis     Code: HIVES, Desc: hives during ivp '96, ok w/ 13 hr prep @ [facility] Onset Date: [id number]   Desc: hives    Ivp Dye [Iodinated Diagnostic Agents] Anaphylaxis   Codeine Nausea And Vomiting   Onion Other (See Comments)    Red Onion Only - Nausea, Vomiting and Passes Out    Family History  Problem Relation Age of Onset   Cancer Father    Social History:  reports that she has  never smoked. She has never used smokeless tobacco. She reports that she does not currently use alcohol. She reports that she does not use drugs.  ROS: A complete review of systems was performed.  All systems are negative except for pertinent findings as noted. Review of Systems  All other systems reviewed and are negative.   Physical Exam:  Vital signs in last 24 hours: Temp:  [98.9 F (37.2 C)-99 F (37.2 C)] 99 F (37.2 C) (11/10 1433) Pulse Rate:  [120-123] 122 (11/10 1433) Resp:  [21-25] 22 (11/10 1433) BP: (79-132)/(52-101) 106/63 (11/10 1433) SpO2:  [93 %-99 %] 93 % (11/10 1433) Weight:  [69.4 kg] 69.4 kg (11/10 1155) General:  Alert and oriented, No acute distress HEENT: Normocephalic, atraumatic  Cardiovascular: Regular rate and rhythm Lungs: Regular rate and effort Abdomen: Soft, nontender, nondistended, no abdominal masses Back: left mild CVA tenderness Extremities: No  edema Neurologic: Grossly intact  Laboratory Data:  Results for orders placed or performed during the hospital encounter of 05/02/21 (from the past 24 hour(s))  Resp Panel by RT-PCR (Flu A&B, Covid) Nasopharyngeal Swab     Status: None   Collection Time: 05/02/21 12:16 PM   Specimen: Nasopharyngeal Swab; Nasopharyngeal(NP) swabs in vial transport medium  Result Value Ref Range   SARS Coronavirus 2 by RT PCR NEGATIVE NEGATIVE   Influenza A by PCR NEGATIVE NEGATIVE   Influenza B by PCR NEGATIVE NEGATIVE  Lactic acid, plasma     Status: Abnormal   Collection Time: 05/02/21 12:16 PM  Result Value Ref Range   Lactic Acid, Venous 3.4 (HH) 0.5 - 1.9 mmol/L  Comprehensive metabolic panel     Status: Abnormal   Collection Time: 05/02/21 12:16 PM  Result Value Ref Range   Sodium 143 135 - 145 mmol/L   Potassium 4.1 3.5 - 5.1 mmol/L   Chloride 112 (H) 98 - 111 mmol/L   CO2 18 (L) 22 - 32 mmol/L   Glucose, Bld 113 (H) 70 - 99 mg/dL   BUN 41 (H) 8 - 23 mg/dL   Creatinine, Ser 3.99 (H) 0.44 - 1.00 mg/dL   Calcium 8.2 (L) 8.9 - 10.3 mg/dL   Total Protein 6.6 6.5 - 8.1 g/dL   Albumin 3.6 3.5 - 5.0 g/dL   AST 219 (H) 15 - 41 U/L   ALT 246 (H) 0 - 44 U/L   Alkaline Phosphatase 127 (H) 38 - 126 U/L   Total Bilirubin 0.9 0.3 - 1.2 mg/dL   GFR, Estimated 12 (L) >60 mL/min   Anion gap 13 5 - 15  CBC WITH DIFFERENTIAL     Status: Abnormal   Collection Time: 05/02/21 12:16 PM  Result Value Ref Range   WBC 21.8 (H) 4.0 - 10.5 K/uL   RBC 4.41 3.87 - 5.11 MIL/uL   Hemoglobin 13.0 12.0 - 15.0 g/dL   HCT 39.9 36.0 - 46.0 %   MCV 90.5 80.0 - 100.0 fL   MCH 29.5 26.0 - 34.0 pg   MCHC 32.6 30.0 - 36.0 g/dL   RDW 13.1 11.5 - 15.5 %   Platelets 125 (L) 150 - 400 K/uL   nRBC 0.0 0.0 - 0.2 %   Neutrophils Relative % 89 %   Neutro Abs 19.4 (H) 1.7 - 7.7 K/uL   Lymphocytes Relative 3 %   Lymphs Abs 0.6 (L) 0.7 - 4.0 K/uL   Monocytes Relative 4 %   Monocytes Absolute 0.8 0.1 - 1.0 K/uL   Eosinophils  Relative 0 %  Eosinophils Absolute 0.0 0.0 - 0.5 K/uL   Basophils Relative 0 %   Basophils Absolute 0.1 0.0 - 0.1 K/uL   WBC Morphology MILD LEFT SHIFT (1-5% METAS, OCC MYELO, OCC BANDS)    Immature Granulocytes 4 %   Abs Immature Granulocytes 0.86 (H) 0.00 - 0.07 K/uL  Urinalysis, Routine w reflex microscopic Urine, Catheterized     Status: Abnormal   Collection Time: 05/02/21 12:16 PM  Result Value Ref Range   Color, Urine AMBER (A) YELLOW   APPearance CLOUDY (A) CLEAR   Specific Gravity, Urine 1.017 1.005 - 1.030   pH 5.0 5.0 - 8.0   Glucose, UA NEGATIVE NEGATIVE mg/dL   Hgb urine dipstick MODERATE (A) NEGATIVE   Bilirubin Urine NEGATIVE NEGATIVE   Ketones, ur NEGATIVE NEGATIVE mg/dL   Protein, ur 30 (A) NEGATIVE mg/dL   Nitrite NEGATIVE NEGATIVE   Leukocytes,Ua LARGE (A) NEGATIVE   RBC / HPF >50 (H) 0 - 5 RBC/hpf   WBC, UA >50 (H) 0 - 5 WBC/hpf   Bacteria, UA MANY (A) NONE SEEN   WBC Clumps PRESENT    Mucus PRESENT    Hyaline Casts, UA PRESENT   Protime-INR     Status: Abnormal   Collection Time: 05/02/21 12:28 PM  Result Value Ref Range   Prothrombin Time 17.0 (H) 11.4 - 15.2 seconds   INR 1.4 (H) 0.8 - 1.2  APTT     Status: Abnormal   Collection Time: 05/02/21 12:28 PM  Result Value Ref Range   aPTT 38 (H) 24 - 36 seconds   Recent Results (from the past 240 hour(s))  Resp Panel by RT-PCR (Flu A&B, Covid) Nasopharyngeal Swab     Status: None   Collection Time: 05/02/21 12:16 PM   Specimen: Nasopharyngeal Swab; Nasopharyngeal(NP) swabs in vial transport medium  Result Value Ref Range Status   SARS Coronavirus 2 by RT PCR NEGATIVE NEGATIVE Final    Comment: (NOTE) SARS-CoV-2 target nucleic acids are NOT DETECTED.  The SARS-CoV-2 RNA is generally detectable in upper respiratory specimens during the acute phase of infection. The lowest concentration of SARS-CoV-2 viral copies this assay can detect is 138 copies/mL. A negative result does not preclude  SARS-Cov-2 infection and should not be used as the sole basis for treatment or other patient management decisions. A negative result may occur with  improper specimen collection/handling, submission of specimen other than nasopharyngeal swab, presence of viral mutation(s) within the areas targeted by this assay, and inadequate number of viral copies(<138 copies/mL). A negative result must be combined with clinical observations, patient history, and epidemiological information. The expected result is Negative.  Fact Sheet for Patients:  EntrepreneurPulse.com.au  Fact Sheet for Healthcare Providers:  IncredibleEmployment.be  This test is no t yet approved or cleared by the [place] FDA and  has been authorized for detection and/or diagnosis of SARS-CoV-2 by FDA under an Emergency Use Authorization (EUA). This EUA will remain  in effect (meaning this test can be used) for the duration of the COVID-19 declaration under Section 564(b)(1) of the Act, 21 U.S.C.section 360bbb-3(b)(1), unless the authorization is terminated  or revoked sooner.       Influenza A by PCR NEGATIVE NEGATIVE Final   Influenza B by PCR NEGATIVE NEGATIVE Final    Comment: (NOTE) The Xpert Xpress SARS-CoV-2/FLU/RSV plus assay is intended as an aid in the diagnosis of influenza from Nasopharyngeal swab specimens and should not be used as a sole basis for treatment. Nasal washings and aspirates  are unacceptable for Xpert Xpress SARS-CoV-2/FLU/RSV testing.  Fact Sheet for Patients: EntrepreneurPulse.com.au  Fact Sheet for Healthcare Providers: IncredibleEmployment.be  This test is not yet approved or cleared by the [place] FDA and has been authorized for detection and/or diagnosis of SARS-CoV-2 by FDA under an Emergency Use Authorization (EUA). This EUA will remain in effect (meaning this test can be used) for the duration of  the COVID-19 declaration under Section 564(b)(1) of the Act, 21 U.S.C. section 360bbb-3(b)(1), unless the authorization is terminated or revoked.  Performed at [facility], [place] [address] [place], [place] [address]    Creatinine: Recent Labs    05/02/21 1216  CREATININE 3.99*    Impression/Assessment:  Sepsis, left distal ureteral stone and hydroyesterday on CT with no hydro on ultrasound today.  Plan:  I discussed with the patient the nature, potential benefits, risks and alternatives to cystoscopy with left retrograde pyelogram  left ureteroscopy, left ureteral stent possible laser lithotripsy, including side effects of the proposed treatment, the likelihood of the patient achieving the goals of the procedure, and any potential problems that might occur during the procedure or recuperation.  Again discussed with the patient we may just place a stent, discussed since the stone is small and distal if the ureteroscope will pass with minimal manipulation we may be able to take a look in the distal ureter. All questions answered. Patient elects to proceed.   Festus Aloe 05/02/2021, 3:58 PM

## 2021-05-02 NOTE — Op Note (Signed)
Preoperative diagnosis: Left distal ureteral stone, sepsis  Postoperative diagnosis: Same  Procedure: Cystoscopy with left retrograde pyelogram, left ureteroscopy laser lithotripsy, stone basket extraction, stent placement  Surgeon: Junious Silk  Anesthesia: General  Indication for procedure: Carol [name] is a 70 year old female seen in our office yesterday with flank pain.  CT scan revealed a distal 4 to 5 mm left ureteral stone.  She felt better after receiving antiemetics and NSAIDs.  She presented today to emergency room with sepsis.  Elevated lactic acid.  She was brought urgently to the OR.  Findings: Carol [name] was in the left distal ureter.  It took very minimal manipulation to pass a single channel semirigid ureteroscope break the stone and a few pieces and remove it.  Left ureteral orifice in the normal anatomic is like position.  Right ureteral orifice noted up near the dome.  Patient with prior right reimplant.  Left retrograde pyelogram-patient has a history of contrast allergy therefore I did a very gentle distal retrograde with only 2 to 3 mL of contrast which outlined the stone in the left distal ureter and mild dilation of the distal to mid ureter.  No contrast reached the proximal ureter or collecting system.  On ureteroscopy the stone was located at the UVJ.  It was broken with the laser and the largest fragment removed.  Other small fragments dropped in the bladder.  Description of procedure: After consent was obtained patient brought to the operating room.  After adequate anesthesia she was placed lithotomy position and prepped and draped in the usual sterile fashion.  Timeout was performed confirming the patient and procedure.  Cystoscope was passed per urethra and the bladder inspected.  No stone in the bladder.  The left ureteral orifice was cannulated with a 5 [place] open-ended catheter and a very gentle left retrograde was performed with minimal contrast.  I then tried to pass a sensor  wire but it kept buckling and then passed an angled glide without difficulty.  Single channel semirigid was advanced and the stone was encountered at the left UVJ.  Pressure on the scope was kept at 75 and water flow to a minimum.  243 m laser fiber advanced and the stone fragmented into 2 pieces largest piece removed and a smaller piece dropped in the bladder.  With the water off the ureter was inspected and noted to have no other stone fragments.  The wire was backloaded on the cystoscope and a 624 cm stent advanced.  The wire was removed with a good coil seen up in the kidney and a good coil in the bladder.  The scope was removed and a 23 French Foley placed in left to gravity drainage to drain the system maximally.  The stent string was taped to the Foley.  She was awakened taken the recovery room in stable condition.  Complications: None  Blood loss: Minimal  Specimens: None  Drains: 6 x 24 cm stent with string taped a 16 French Foley catheter  Disposition: Patient stable to PACU

## 2021-05-02 NOTE — Progress Notes (Signed)
Pharmacy Antibiotic Note  Carol [name] is a 70 y.o. female admitted on 05/02/2021 with urosepsis.  Pharmacy has been consulted for vancomycin dosing.  Plan: Ceftriaxone 2 gr IV q24h daily x 7  Vancomycin 1250 mg IV x1 then will dose on levels as Scr elevated. If improves after procedure will start maintenance dosing   Height: 5' (152.4 cm) Weight: 69.4 kg (153 lb) IBW/kg (Calculated) : 45.5  Temp (24hrs), Avg:99 F (37.2 C), Min:98.9 F (37.2 C), Max:99 F (37.2 C)  Recent Labs  Lab 05/02/21 1216  WBC 21.8*  CREATININE 3.99*  LATICACIDVEN 3.4*    Estimated Creatinine Clearance: 11.9 mL/min (A) (by C-G formula based on SCr of 3.99 mg/dL (H)).    Allergies  Allergen Reactions   Iohexol Anaphylaxis     Code: HIVES, Desc: hives during ivp '96, ok w/ 13 hr prep @ [facility] Onset Date: [id number]   Desc: hives    Ivp Dye [Iodinated Diagnostic Agents] Anaphylaxis   Codeine Nausea And Vomiting   Onion Other (See Comments)    Red Onion Only - Nausea, Vomiting and Passes Out    Antimicrobials this admission:  11/10 vancomycin >>  11/10 ceftriaxone >>   Dose adjustments this admission:    Microbiology results:  11/10 BCx:  11/10 resp panel:  11/10 UCx:    Thank you for allowing pharmacy to be a part of this patient's care.   Royetta Asal, PharmD, BCPS 05/02/2021 2:47 PM

## 2021-05-02 NOTE — ED Notes (Signed)
EDP at the bedside to re-eval.

## 2021-05-02 NOTE — ED Notes (Signed)
Report given to Merrily Pew, RN., Short Stay.

## 2021-05-02 NOTE — ED Notes (Signed)
Lactic 3.4, MD Pickering notified.

## 2021-05-02 NOTE — ED Provider Notes (Signed)
[place] DEPT Provider Note   CSN: [id number] Arrival date & time: 05/02/21  1148     History Chief Complaint  Patient presents with   Code Sepsis    Carol [name] is a 70 y.o. female.  HPI Patient presents with nausea vomiting diarrhea lightheadedness dizziness and abdominal pain.  Reportedly was seen at [facility] urology yesterday and diagnosed with a left-sided kidney stone.  States she has been having abdominal pain for the last week.  Some dysuria.  Feels if she has another kidney stone, although other stones have been on the right side.  States seen at [facility] urology yesterday and they were never able to get a blood pressure on her.  States she was not able to urinate because she is dehydrated.  States she was not started on antibiotics.  Feeling worse today.  More lightheadedness.  Has chills.    Past Medical History:  Diagnosis Date   Abnormal Pap smear of cervix    in 20's   Anxiety    claustrophobic   Arthritis    "neck, hands" (09/02/2013)   Basal cell carcinoma of forehead 2008; 2014   Dizziness last occurance 10-15-2014   on occasion, not related to activity   DVT (deep venous thrombosis) ([place]) 06/28/2014   Dr [employer], right leg, behind knee   Environmental allergies    Exercise-induced asthma 30's   GERD (gastroesophageal reflux disease)    H/O hiatal hernia    Herpes simplex 1976   History of kidney stones    Hyperlipidemia    Migraine teens, returned in 39"s   "depends on the change in the weather; last one was 1 wk ago" (09/02/2013)   Plantar fasciitis    PONV (postoperative nausea and vomiting) 3/ 2015   ponv after gallblaffer   Right ureteral stone    Seasonal allergic rhinitis    Tubular adenoma of colon 05/22/2017   per path report.    Tubular adenoma of colon 06/29/2020    Patient Active Problem List   Diagnosis Date Noted   Sepsis ([place]) 05/02/2021   History of renal stone 05/24/2020   Migraine  headache with aura 12/14/2018   History of DVT of lower extremity 10/23/2017   Nocturnal muscle cramps 10/23/2017   Tubular adenoma of colon 05/21/2016   S/P laparoscopic cholecystectomy March 2015 09/03/2013   Hyperlipidemia with target LDL less than 100 06/27/2011   Herpes 12/16/2010   GERD (gastroesophageal reflux disease) 12/16/2010   HH (hiatus hernia) 12/16/2010   Hot flashes, menopausal 12/16/2010   Diverticulosis 12/16/2010   Asthma 12/16/2010   BCE (basal cell epithelioma), 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    Past Surgical History:  Procedure Laterality Date   [place] N/A 09/02/2013   Procedure: LAPAROSCOPIC CHOLECYSTECTOMY;  Surgeon: Harl Bowie, MD;  Location: [place];  Service: General;  Laterality: N/A;   CRYOTHERAPY     for abnormal pap smear in 20's   CYSTOSCOPY W/ URETERAL STENT PLACEMENT  03/05/2012   Procedure: CYSTOSCOPY WITH RETROGRADE PYELOGRAM/URETERAL STENT PLACEMENT;  Surgeon: Hanley Ben, MD;  Location: [place];  Service: Urology;  Laterality: Right;   CYSTOSCOPY W/ URETERAL STENT PLACEMENT Right 01/19/2015   Procedure: CYSTOSCOPY WITH RETROGRADE PYELOGRAM/URETERAL  RIGHT STENT PLACEMENT;  Surgeon: Alexis Frock, MD;  Location: [facility];  Service: Urology;  Laterality: Right;   CYSTOSCOPY WITH RETROGRADE PYELOGRAM, URETEROSCOPY AND STENT PLACEMENT Right 09/08/2014   Procedure:  CYSTOSCOPY WITH RIGHT  RETROGRADE PYELOGRAM, URETEROSCOPY AND RIGHT STENT PLACEMENT;  Surgeon: Raynelle Bring, MD;  Location: [facility];  Service: Urology;  Laterality: Right;   CYSTOSCOPY WITH RETROGRADE PYELOGRAM, URETEROSCOPY AND STENT PLACEMENT Right 10/19/2014   Procedure: CYSTOSCOPY WITH Bilateral RETROGRADE PYELOGRAM/RIGHT URETEROSCOPY,RIGHT JJ STENT;  Surgeon: Carolan Clines, MD;  Location: [facility];  Service: Urology;  Laterality: Right;   CYSTOSCOPY/RETROGRADE/URETEROSCOPY  11/18/2011   Procedure:  CYSTOSCOPY/RETROGRADE/URETEROSCOPY;  Surgeon: Ailene Rud, MD;  Location: [facility];  Service: Urology;  Laterality: Right;   CYSTOSCOPY/RETROGRADE/URETEROSCOPY/STONE EXTRACTION WITH BASKET  10/21/2011   Procedure: CYSTOSCOPY/RETROGRADE/URETEROSCOPY/STONE EXTRACTION WITH BASKET;  Surgeon: Ailene Rud, MD;  Location: [facility];  Service: Urology;  Laterality: Right;   CYSTOSCOPY/RETROGRADE/URETEROSCOPY/STONE EXTRACTION WITH BASKET Right 09/10/2012   Procedure: RIGHT URETEROSCOPY/STONE EXTRACTION/LASER LITHOTRIPSY/POSSIBLE DOUBLE J STENT PLACEMENT;  Surgeon: Ailene Rud, MD;  Location: [facility];  Service: Urology;  Laterality: Right;   MOHS SURGERY  2008; 2014   PLANTAR FASCIA SURGERY  05/2016   POSTERIOR PINNACLE MESH SACROSPINOUS REPAIR  11-22-2010   RECURRENT ENTEROCELE   RIGHT URETEROSCOPIC STONE EXTRACTION W/ STENT PLACEMENT  08-12-2011   x2 stents   ROBOTICALLY ASSISTED LAPAROSCOPIC URETERAL RE-IMPLANTATION Right 01/19/2015   Procedure: ROBOTICALLY ASSISTED LAPAROSCOPIC URETERAL RE-IMPLANTATION RIGHT;  Surgeon: Alexis Frock, MD;  Location: [facility];  Service: Urology;  Laterality: Right;   TUBAL LIGATION  1991   VAULT SUSPENSION (DERMAL GRAFT)/ ENTEROCELE AND RECTOCELE REPAIR  12-05-2005   VAULT PROLAPSE/ ENTERCELE/ RECTOCELE     OB History     Gravida  3   Para  3   Term  3   Preterm  0   AB  0   Living  3      SAB  0   IAB  0   Ectopic  0   Multiple  0   Live Births  3           Family History  Problem Relation Age of Onset   Cancer Father     Social History   Tobacco Use   Smoking status: Never   Smokeless tobacco: Never  Substance Use Topics   Alcohol use: Not Currently   Drug use: No    Home Medications Prior to Admission medications   Medication Sig Start Date End Date Taking? Authorizing Provider  atorvastatin (LIPITOR) 20 MG tablet Take 1 tablet (20 mg total) by mouth daily. 08/23/20  Yes Denita Lung,  MD  beclomethasone (QVAR REDIHALER) 40 MCG/ACT inhaler Inhale 2 puffs into the lungs 2 (two) times daily. Patient taking differently: Inhale 2 puffs into the lungs 2 (two) times daily as needed (shortness of Breath). 12/14/18  Yes Denita Lung, MD  diclofenac sodium (VOLTAREN) 1 % GEL Apply 2 g topically 4 (four) times daily.   Yes [provider]  ketorolac (TORADOL) 10 MG tablet Take 10 mg by mouth every 8 (eight) hours as needed for pain. 05/01/21  Yes [provider]  methocarbamol (ROBAXIN) 500 MG tablet Take 500 mg by mouth daily as needed (migraine). 05/31/18  Yes [provider]  ondansetron (ZOFRAN) 4 MG tablet Take 4 mg by mouth every 8 (eight) hours as needed for nausea/vomiting. 05/01/21  Yes [provider]  pantoprazole (PROTONIX) 40 MG tablet Take 1 tablet (40 mg total) by mouth daily. 08/23/20  Yes Denita Lung, MD  tamsulosin (FLOMAX) 0.4 MG CAPS capsule Take 0.4 mg by mouth at bedtime. 05/01/21  Yes [provider]  valACYclovir (VALTREX) 500 MG tablet Take 1 tablet (500 mg total) by mouth daily. Patient taking differently: Take 500 mg by mouth daily as needed (flare up). 08/23/20  Yes Denita Lung, MD  zonisamide (ZONEGRAN) 100 MG capsule Take 100 mg by mouth 2 (two) times daily.   Yes [provider]  albuterol (PROAIR HFA) 108 (90 Base) MCG/ACT inhaler INHALE 2 PUFFS INTO THE LUNGS EVERY 6 HOURS AS NEEDED FOR WHEEZING/ SHORTNESS OF BREATH. Patient taking differently: Inhale 2 puffs into the lungs every 6 (six) hours as needed for wheezing or shortness of breath. 10/16/20   Denita Lung, MD  HYDROcodone-acetaminophen (NORCO/VICODIN) 5-325 MG tablet Take 1-2 tablets by mouth every 6 (six) hours as needed for pain. 05/01/21   [provider]    Allergies    Iohexol, Ivp dye [iodinated diagnostic agents], Codeine, and Onion  Review of Systems   Review of Systems  Constitutional:  Positive for appetite change,  chills and fatigue.  HENT:  Negative for congestion.   Respiratory:  Negative for shortness of breath.   Cardiovascular:  Negative for chest pain.  Gastrointestinal:  Positive for abdominal pain.  Genitourinary:  Positive for flank pain and hematuria.  Musculoskeletal:  Negative for back pain.  Skin:  Negative for rash.  Neurological:  Negative for weakness.   Physical Exam Updated Vital Signs BP (!) 95/52   Pulse (!) 121   Temp 98.9 F (37.2 C) (Rectal)   Resp (!) 25   Ht 5' (1.524 m)   Wt 69.4 kg   LMP 06/23/1990 (Approximate)   SpO2 98%   BMI 29.88 kg/m   Physical Exam Vitals and nursing note reviewed.  HENT:     Head: Atraumatic.     Mouth/Throat:     Mouth: Mucous membranes are dry.  Eyes:     Pupils: Pupils are equal, round, and reactive to light.  Cardiovascular:     Rate and Rhythm: Regular rhythm. Tachycardia present.  Abdominal:     Tenderness: There is abdominal tenderness.     Comments: Left lower quadrant tenderness without rebound or guarding.  No hernias palpated.  Musculoskeletal:        General: No tenderness.     Cervical back: Neck supple.  Skin:    Capillary Refill: Capillary refill takes less than 2 seconds.     Coloration: Skin is pale.  Neurological:     Mental Status: She is alert and oriented to person, place, and time.  Psychiatric:        Mood and Affect: Mood normal.    ED Results / Procedures / Treatments   Labs (all labs ordered are listed, but only abnormal results are displayed) Labs Reviewed  LACTIC ACID, PLASMA - Abnormal; Notable for the following components:      Result Value   Lactic Acid, Venous 3.4 (*)    All other components within normal limits  COMPREHENSIVE METABOLIC PANEL - Abnormal; Notable for the following components:   Chloride 112 (*)    CO2 18 (*)    Glucose, Bld 113 (*)    BUN 41 (*)    Creatinine, Ser 3.99 (*)    Calcium 8.2 (*)    AST 219 (*)    ALT 246 (*)    Alkaline Phosphatase 127 (*)    GFR,  Estimated 12 (*)    All other components within normal limits  CBC WITH DIFFERENTIAL/PLATELET - Abnormal; Notable for the following components:   WBC 21.8 (*)  Platelets 125 (*)    Neutro Abs 19.4 (*)    Lymphs Abs 0.6 (*)    Abs Immature Granulocytes 0.86 (*)    All other components within normal limits  URINALYSIS, ROUTINE W REFLEX MICROSCOPIC - Abnormal; Notable for the following components:   Color, Urine AMBER (*)    APPearance CLOUDY (*)    Hgb urine dipstick MODERATE (*)    Protein, ur 30 (*)    Leukocytes,Ua LARGE (*)    RBC / HPF >50 (*)    WBC, UA >50 (*)    Bacteria, UA MANY (*)    All other components within normal limits  PROTIME-INR - Abnormal; Notable for the following components:   Prothrombin Time 17.0 (*)    INR 1.4 (*)    All other components within normal limits  APTT - Abnormal; Notable for the following components:   aPTT 38 (*)    All other components within normal limits  RESP PANEL BY RT-PCR (FLU A&B, COVID) ARPGX2  CULTURE, BLOOD (ROUTINE X 2)  CULTURE, BLOOD (ROUTINE X 2)  URINE CULTURE  LACTIC ACID, PLASMA  HIV ANTIBODY (ROUTINE TESTING W REFLEX)    EKG EKG Interpretation  Date/Time:  Thursday May 02 2021 11:57:38 EST Ventricular Rate:  119 PR Interval:  143 QRS Duration: 92 QT Interval:  340 QTC Calculation: 479 R Axis:   59 Text Interpretation: Sinus tachycardia Confirmed by Davonna Belling [phone]) on 05/02/2021 12:49:17 PM  Radiology DG Chest Port 1 View  Result Date: 05/02/2021 CLINICAL DATA:  Questionable sepsis - evaluate for abnormality EXAM: PORTABLE CHEST 1 VIEW COMPARISON:  06/28/2014. FINDINGS: Low lung volumes. No consolidation. No visible pleural effusions or pneumothorax. Mildly enlarged cardiomediastinal silhouette, likely exaggerated by low lung volumes and AP technique. No acute osseous abnormality. IMPRESSION: Low lung volumes limits evaluation of the lung bases without definite evidence of acute cardiopulmonary  disease. Dedicated PA and lateral radiographs could better evaluate if clinically indicated. Electronically Signed   By: Margaretha Sheffield M.D.   On: 05/02/2021 13:37    Procedures Procedures   Medications Ordered in ED Medications  lactated ringers infusion ( Intravenous New Bag/Given 05/02/21 1337)  cefTRIAXone (ROCEPHIN) 2 g in sodium chloride 0.9 % 100 mL IVPB (2 g Intravenous New Bag/Given 05/02/21 1219)  docusate sodium (COLACE) capsule 100 mg (has no administration in time range)  polyethylene glycol (MIRALAX / GLYCOLAX) packet 17 g (has no administration in time range)  heparin injection 5,000 Units (has no administration in time range)  ondansetron (ZOFRAN) injection 4 mg (has no administration in time range)  albuterol (VENTOLIN HFA) 108 (90 Base) MCG/ACT inhaler 1-2 puff (has no administration in time range)  lactated ringers bolus 1,000 mL (1,000 mLs Intravenous New Bag/Given 05/02/21 1220)    And  lactated ringers bolus 1,000 mL (1,000 mLs Intravenous New Bag/Given 05/02/21 1225)    And  lactated ringers bolus 250 mL (1,000 mLs Intravenous New Bag/Given 05/02/21 1300)  lactated ringers bolus 1,000 mL (1,000 mLs Intravenous New Bag/Given 05/02/21 1329)    ED Course  I have reviewed the triage vital signs and the nursing notes.  Pertinent labs & imaging results that were available during my care of the patient were reviewed by me and considered in my medical decision making (see chart for details).    MDM Rules/Calculators/A&P                           Patient presents  with weakness.  Chills.  Tachycardia.  Has had nausea vomiting diarrhea.  Diagnosed with a kidney stone yesterday at [facility] urology.  I discussed with Dr. Junious Silk.  CT scan showed 3 mm distal stone.  Has been feeling better after treatment yesterday.  Also has had nausea vomiting diarrhea.  Mouth dry.  Hypotension.  The hypotension could be secondary to the sepsis or dehydration secondary to nausea  vomiting diarrhea that she has been having.  With new acute kidney injury and hypotension this appears to be a septic shock.  Has had multiple fluid boluses.  Last time I was in the room we were on liters 6 and 7.  Blood pressures improved from the 70s up into the 90s at x100s.  Dr. Junious Silk will be taking 2 OR for infected obstructed stone.  Urine shows infection.  Admit to ICU.  Dr. Carlis Abbott is seen patient in the ER.  CRITICAL CARE Performed by: Davonna Belling Total critical care time: 30 minutes Critical care time was exclusive of separately billable procedures and treating other patients. Critical care was necessary to treat or prevent imminent or life-threatening deterioration. Critical care was time spent personally by me on the following activities: development of treatment plan with patient and/or surrogate as well as nursing, discussions with consultants, evaluation of patient's response to treatment, examination of patient, obtaining history from patient or surrogate, ordering and performing treatments and interventions, ordering and review of laboratory studies, ordering and review of radiographic studies, pulse oximetry and re-evaluation of patient's condition.  Final Clinical Impression(s) / ED Diagnoses Final diagnoses:  Sepsis with acute renal failure and septic shock, due to unspecified organism, unspecified acute renal failure type [facility])    Rx / DC Orders ED Discharge Orders     None        Davonna Belling, MD 05/02/21 1405

## 2021-05-02 NOTE — H&P (Signed)
NAME:  Carol [name], MRN:  [id number], DOB:  07/02/1950, LOS: 0 ADMISSION DATE:  05/02/2021, CONSULTATION DATE:  05/02/21 REFERRING MD:  [facility], CHIEF COMPLAINT:  shock   History of Present Illness:  Carol [name] is a 70 y/o woman with a history of nephrolithiasis and mild intermittent asthma who presented to the ED today with LLQ and flank pain, lightheadedness, weakness, and n/v/d. She was diagnosed with a kidney stone yesterday at a urology appointment after 1 week of abdominal pain. During her visit they had trouble getting a BP but she was able to walk around. Last night when she got up to go to the bathroom in the middle of the night she felt weak and fell forward, hitting her forehead due to weakness and lightheadedness, but was caught before she fell to the floor. She did not lose consciousness. She has a history of previous kidney stones but has not had issues with feeling this bad from them before.  Today she complains of chills. Upon presentation to the ED she was hypotensive and given a fluid bolus. She had a CT scan demonstrating hydronephrosis. Urology is planning for a procedure this afternoon. She was given 7L IVF bolus and started on ceftriaxone in the ED.  Pertinent  Medical History  Exercise-induced asthma, only needs meds PRN Nephrolithiasis migraines  Significant Hospital Events: Including procedures, antibiotic start and stop dates in addition to other pertinent events   11/10 admission, urology procedure  Interim History / Subjective:    Objective   Blood pressure (!) 95/52, pulse (!) 121, temperature 98.9 F (37.2 C), temperature source Rectal, resp. rate (!) 25, height 5' (1.524 m), weight 69.4 kg, last menstrual period 06/23/1990, SpO2 98 %.       No intake or output data in the 24 hours ending 05/02/21 1407 Filed Weights   05/02/21 1155  Weight: 69.4 kg    Examination: General: ill appearing woman lying in bed in NAD, rigors HENT: [place]/At,  eyes anicteric, oral mucosa dry Lungs: tachypneic, no accessory muscle use, CTAB Cardiovascular: S1S2, tachycardic, reg rhythm Abdomen: soft, TTP RLQ, no guarding Extremities: no LE edema, no cyanosis Neuro: awake, alert, moving extremities spontaneously, answering questions appropriately Derm: warm, dry, no rashes.  LA 3.4 WBC 21.8 Platelets 125 UA: >50 WBCs  Resolved Hospital Problem list     Assessment & Plan:   Sepsis due to UTI with associated AKI, thrombocytopenia, transaminase elevation -IVF bolus -Antibiotics-- adding vancomycin given history of Enterococcus and escalating ceftriaxone to cefepime. I have requested this to be given in short stay prior to her procedure. -Source control per Urology; appreciate their assistance> planning for stone retrieval -If hypotensive, will require vasopressors as required to maintain MAP >65  Acute respiratory failure with hypoxia -supplemental O2 as required to maintain SpO2 >90%; wean as able  AKI- likely mixed septic and obstructive, but worry about ATN from pre-renal AKI -monitor renal function -strict I/Os -renally dose meds, avoid nephrotoxic meds  Lactic acidosis -recheck pending -volume resuscitation -maintain adequate perfusion pressure  Elevated transaminases -monitor  Hyperglycemia -check A1c -SSI PRN -goal BG <180  Mild coagulopathy likely 2/2 sepsis -monitor  Wife updated at bedside during rounds.  Best Practice (right click and "Reselect all SmartList Selections" daily)   Diet/type: NPO DVT prophylaxis: prophylactic heparin  GI prophylaxis: N/A Lines: N/A Foley:  N/A Code Status:  full code Last date of multidisciplinary goals of care discussion [ 11/10]  Labs   CBC: Recent Labs  Lab  05/02/21 1216  WBC 21.8*  NEUTROABS 19.4*  HGB 13.0  HCT 39.9  MCV 90.5  PLT 125*    Basic Metabolic Panel: Recent Labs  Lab 05/02/21 1216  NA 143  K 4.1  CL 112*  CO2 18*  GLUCOSE 113*  BUN 41*   CREATININE 3.99*  CALCIUM 8.2*   GFR: Estimated Creatinine Clearance: 11.9 mL/min (A) (by C-G formula based on SCr of 3.99 mg/dL (H)). Recent Labs  Lab 05/02/21 1216  WBC 21.8*  LATICACIDVEN 3.4*    Liver Function Tests: Recent Labs  Lab 05/02/21 1216  AST 219*  ALT 246*  ALKPHOS 127*  BILITOT 0.9  PROT 6.6  ALBUMIN 3.6   No results for input(s): LIPASE, AMYLASE in the last 168 hours. No results for input(s): AMMONIA in the last 168 hours.  ABG No results found for: PHART, PCO2ART, PO2ART, HCO3, TCO2, ACIDBASEDEF, O2SAT   Coagulation Profile: Recent Labs  Lab 05/02/21 1228  INR 1.4*    Cardiac Enzymes: No results for input(s): CKTOTAL, CKMB, CKMBINDEX, TROPONINI in the last 168 hours.  HbA1C: Hgb A1c MFr Bld  Date/Time Value Ref Range Status  02/22/2013 10:20 AM 5.6 <5.7 % Final    Comment:                                                                           According to the ADA Clinical Practice Recommendations for 2011, when HbA1c is used as a screening test:     >=6.5%   Diagnostic of Diabetes Mellitus            (if abnormal result is confirmed)   5.7-6.4%   Increased risk of developing Diabetes Mellitus   References:Diagnosis and Classification of Diabetes Mellitus,Diabetes OZDG,6440,34(VQQVZ 1):S62-S69 and Standards of Medical Care in         Diabetes - 2011,Diabetes DGLO,7564,33 (Suppl 1):S11-S61.      CBG: No results for input(s): GLUCAP in the last 168 hours.  Review of Systems:   Review of Systems  Constitutional:  Positive for chills and malaise/fatigue.  HENT:  Negative for congestion and sore throat.   Eyes: Negative.   Respiratory:  Positive for shortness of breath. Negative for cough and wheezing.   Cardiovascular:  Negative for chest pain and leg swelling.  Gastrointestinal:  Positive for abdominal pain, diarrhea, nausea and vomiting. Negative for heartburn.  Genitourinary:  Positive for flank pain.  Musculoskeletal:   Positive for back pain.  Skin:  Negative for rash.  Neurological:  Positive for weakness.    Past Medical History:  She,  has a past medical history of Abnormal Pap smear of cervix, Anxiety, Arthritis, Basal cell carcinoma of forehead (2008; 2014), Dizziness (last occurance 10-15-2014), DVT (deep venous thrombosis) ([place]) (06/28/2014), Environmental allergies, Exercise-induced asthma (30's), GERD (gastroesophageal reflux disease), H/O hiatal hernia, Herpes simplex (1976), History of kidney stones, Hyperlipidemia, Migraine (teens, returned in 54"s), Plantar fasciitis, PONV (postoperative nausea and vomiting) (3/ 2015), Right ureteral stone, Seasonal allergic rhinitis, Tubular adenoma of colon (05/22/2017), and Tubular adenoma of colon (06/29/2020).   Surgical History:   Past Surgical History:  Procedure Laterality Date   [place]   CHOLECYSTECTOMY N/A 09/02/2013  Procedure: LAPAROSCOPIC CHOLECYSTECTOMY;  Surgeon: Harl Bowie, MD;  Location: [facility];  Service: General;  Laterality: N/A;   CRYOTHERAPY     for abnormal pap smear in 20's   CYSTOSCOPY W/ URETERAL STENT PLACEMENT  03/05/2012   Procedure: CYSTOSCOPY WITH RETROGRADE PYELOGRAM/URETERAL STENT PLACEMENT;  Surgeon: Hanley Ben, MD;  Location: [facility];  Service: Urology;  Laterality: Right;   CYSTOSCOPY W/ URETERAL STENT PLACEMENT Right 01/19/2015   Procedure: CYSTOSCOPY WITH RETROGRADE PYELOGRAM/URETERAL  RIGHT STENT PLACEMENT;  Surgeon: Alexis Frock, MD;  Location: [facility];  Service: Urology;  Laterality: Right;   CYSTOSCOPY WITH RETROGRADE PYELOGRAM, URETEROSCOPY AND STENT PLACEMENT Right 09/08/2014   Procedure: CYSTOSCOPY WITH RIGHT  RETROGRADE PYELOGRAM, URETEROSCOPY AND RIGHT STENT PLACEMENT;  Surgeon: Raynelle Bring, MD;  Location: [facility];  Service: Urology;  Laterality: Right;   CYSTOSCOPY WITH RETROGRADE PYELOGRAM, URETEROSCOPY AND STENT PLACEMENT Right 10/19/2014    Procedure: CYSTOSCOPY WITH Bilateral RETROGRADE PYELOGRAM/RIGHT URETEROSCOPY,RIGHT JJ STENT;  Surgeon: Carolan Clines, MD;  Location: [facility];  Service: Urology;  Laterality: Right;   CYSTOSCOPY/RETROGRADE/URETEROSCOPY  11/18/2011   Procedure: CYSTOSCOPY/RETROGRADE/URETEROSCOPY;  Surgeon: Ailene Rud, MD;  Location: [facility];  Service: Urology;  Laterality: Right;   CYSTOSCOPY/RETROGRADE/URETEROSCOPY/STONE EXTRACTION WITH BASKET  10/21/2011   Procedure: CYSTOSCOPY/RETROGRADE/URETEROSCOPY/STONE EXTRACTION WITH BASKET;  Surgeon: Ailene Rud, MD;  Location: [facility];  Service: Urology;  Laterality: Right;   CYSTOSCOPY/RETROGRADE/URETEROSCOPY/STONE EXTRACTION WITH BASKET Right 09/10/2012   Procedure: RIGHT URETEROSCOPY/STONE EXTRACTION/LASER LITHOTRIPSY/POSSIBLE DOUBLE J STENT PLACEMENT;  Surgeon: Ailene Rud, MD;  Location: [facility];  Service: Urology;  Laterality: Right;   MOHS SURGERY  2008; 2014   PLANTAR FASCIA SURGERY  05/2016   POSTERIOR PINNACLE MESH SACROSPINOUS REPAIR  11-22-2010   RECURRENT ENTEROCELE   RIGHT URETEROSCOPIC STONE EXTRACTION W/ STENT PLACEMENT  08-12-2011   x2 stents   ROBOTICALLY ASSISTED LAPAROSCOPIC URETERAL RE-IMPLANTATION Right 01/19/2015   Procedure: ROBOTICALLY ASSISTED LAPAROSCOPIC URETERAL RE-IMPLANTATION RIGHT;  Surgeon: Alexis Frock, MD;  Location: [facility];  Service: Urology;  Laterality: Right;   TUBAL LIGATION  1991   VAULT SUSPENSION (DERMAL GRAFT)/ ENTEROCELE AND RECTOCELE REPAIR  12-05-2005   VAULT PROLAPSE/ ENTERCELE/ RECTOCELE     Social History:   reports that she has never smoked. She has never used smokeless tobacco. She reports that she does not currently use alcohol. She reports that she does not use drugs.   Family History:  Her family history includes Cancer in her father.   Allergies Allergies  Allergen Reactions   Iohexol Anaphylaxis     Code: HIVES, Desc: hives during ivp '96, ok w/ 13 hr prep @  [facility] Onset Date: [id number]   Desc: hives    Ivp Dye [Iodinated Diagnostic Agents] Anaphylaxis   Codeine Nausea And Vomiting   Onion Other (See Comments)    Red Onion Only - Nausea, Vomiting and Passes Out     Home Medications  Prior to Admission medications   Medication Sig Start Date End Date Taking? Authorizing Provider  atorvastatin (LIPITOR) 20 MG tablet Take 1 tablet (20 mg total) by mouth daily. 08/23/20  Yes Denita Lung, MD  beclomethasone (QVAR REDIHALER) 40 MCG/ACT inhaler Inhale 2 puffs into the lungs 2 (two) times daily. Patient taking differently: Inhale 2 puffs into the lungs 2 (two) times daily as needed (shortness of Breath). 12/14/18  Yes Denita Lung, MD  diclofenac sodium (VOLTAREN) 1 % GEL Apply 2 g topically 4 (four) times daily.   Yes [provider]  ketorolac (TORADOL) 10 MG tablet Take 10 mg by mouth every 8 (eight) hours as needed for pain. 05/01/21  Yes [provider]  methocarbamol (ROBAXIN) 500 MG tablet Take 500 mg by mouth daily as needed (migraine). 05/31/18  Yes [provider]  ondansetron (ZOFRAN) 4 MG tablet Take 4 mg by mouth every 8 (eight) hours as needed for nausea/vomiting. 05/01/21  Yes [provider]  pantoprazole (PROTONIX) 40 MG tablet Take 1 tablet (40 mg total) by mouth daily. 08/23/20  Yes Denita Lung, MD  tamsulosin (FLOMAX) 0.4 MG CAPS capsule Take 0.4 mg by mouth at bedtime. 05/01/21  Yes [provider]  valACYclovir (VALTREX) 500 MG tablet Take 1 tablet (500 mg total) by mouth daily. Patient taking differently: Take 500 mg by mouth daily as needed (flare up). 08/23/20  Yes Denita Lung, MD  zonisamide (ZONEGRAN) 100 MG capsule Take 100 mg by mouth 2 (two) times daily.   Yes [provider]  albuterol (PROAIR HFA) 108 (90 Base) MCG/ACT inhaler INHALE 2 PUFFS INTO THE LUNGS EVERY 6 HOURS AS NEEDED FOR WHEEZING/ SHORTNESS OF BREATH. Patient taking differently: Inhale 2 puffs into  the lungs every 6 (six) hours as needed for wheezing or shortness of breath. 10/16/20   Denita Lung, MD  HYDROcodone-acetaminophen (NORCO/VICODIN) 5-325 MG tablet Take 1-2 tablets by mouth every 6 (six) hours as needed for pain. 05/01/21   [provider]     Critical care time: 60 min     Julian Hy, DO 05/02/21 3:07 PM [facility]

## 2021-05-02 NOTE — Progress Notes (Signed)
Post-procedure, has stent and foley. Slightly sleepy, but awake and responsive. Bloody UOP in foley. MAP around 65, awake, alert. She wants to drink. Will order clear liquid diet, oxycodone and fentanyl PRN for pain, xofran and phergan for nausea. Peripheral NE ordered if needed overnight. LR 100cc/h overnight.  Julian Hy, DO 05/02/21 6:51 PM [facility]

## 2021-05-02 NOTE — Anesthesia Preprocedure Evaluation (Addendum)
Anesthesia Evaluation  Patient identified by MRN, date of birth, ID band Patient awake    Reviewed: Allergy & Precautions, H&P , NPO status , Patient's Chart, lab work & pertinent test results  History of Anesthesia Complications (+) PONV  Airway Mallampati: III  TM Distance: >3 FB Neck ROM: Full    Dental no notable dental hx. (+) Teeth Intact, Dental Advisory Given   Pulmonary asthma ,    Pulmonary exam normal breath sounds clear to auscultation       Cardiovascular negative cardio ROS   Rhythm:Regular Rate:Normal     Neuro/Psych  Headaches, Anxiety    GI/Hepatic Neg liver ROS, hiatal hernia, GERD  Medicated,  Endo/Other  negative endocrine ROS  Renal/GU negative Renal ROS  negative genitourinary   Musculoskeletal  (+) Arthritis , Osteoarthritis,    Abdominal   Peds  Hematology negative hematology ROS (+)   Anesthesia Other Findings   Reproductive/Obstetrics negative OB ROS                            Anesthesia Physical Anesthesia Plan  ASA: 2 and emergent  Anesthesia Plan: General   Post-op Pain Management:    Induction: Intravenous, Rapid sequence and Cricoid pressure planned  PONV Risk Score and Plan: 4 or greater and Ondansetron, Dexamethasone and Midazolam  Airway Management Planned: Oral ETT  Additional Equipment:   Intra-op Plan:   Post-operative Plan: Extubation in OR  Informed Consent: I have reviewed the patients History and Physical, chart, labs and discussed the procedure including the risks, benefits and alternatives for the proposed anesthesia with the patient or authorized representative who has indicated his/her understanding and acceptance.     Dental advisory given  Plan Discussed with: CRNA  Anesthesia Plan Comments:         Anesthesia Quick Evaluation

## 2021-05-02 NOTE — Progress Notes (Addendum)
[facility] Progress Note Patient Name: Carol [name] DOB: 09/28/1950 MRN: [id number]   Date of Service  05/02/2021  HPI/Events of Note  Patient with soft blood pressures,  SBP 70's to 90, current BP 88/60, she is on peripheral Levo gtt @ 10 mcg.  eICU Interventions  LR 500 ml iv fluid bolus x 1, peripheral Neo gtt ordered as well. Serum lactic acid check ordered.        [name] [name] 05/02/2021, 10:04 PM

## 2021-05-02 NOTE — ED Notes (Signed)
Pt has received 2L NS bolus, one given in ED and one given via EMS as well as, 3LR boluses at this time. EDP notified and aware. Difficulty obtaining pt's BP since arrival to ED d/t pt shivering and hypotension despite both monitor automatic BP cuff use and manual BP cuff use. EDP aware. Last BP obtained 108/73.

## 2021-05-02 NOTE — ED Notes (Signed)
EDP at the bedside.  ?

## 2021-05-02 NOTE — Transfer of Care (Signed)
Immediate Anesthesia Transfer of Care Note  Patient: Carol [name]  Procedure(s) Performed: CYSTOSCOPY/URETEROSCOPY/HOLMIUM LASER/STENT PLACEMENT (Left)  Patient Location: PACU  Anesthesia Type:General  Level of Consciousness: awake and patient cooperative  Airway & Oxygen Therapy: Patient Spontanous Breathing and Patient connected to face mask oxygen  Post-op Assessment: Report given to RN and Post -op Vital signs reviewed and stable  Post vital signs: Reviewed, on neo gtt titrating down  Last Vitals:  Vitals Value Taken Time  BP 150/109 05/02/21 1719  Temp    Pulse 128 05/02/21 1721  Resp 21 05/02/21 1721  SpO2 99 % 05/02/21 1721  Vitals shown include unvalidated device data.  Last Pain:  Vitals:   05/02/21 1433  TempSrc: Oral  PainSc: 6       Patients Stated Pain Goal: 3 (24/81/85 9093)  Complications: No notable events documented.

## 2021-05-02 NOTE — Sepsis Progress Note (Signed)
eLink monitoring code sepsis at this time.

## 2021-05-02 NOTE — Anesthesia Procedure Notes (Signed)
Procedure Name: Intubation Date/Time: 05/02/2021 4:15 PM Performed by: Eben Burow, CRNA Pre-anesthesia Checklist: Patient identified, Emergency Drugs available, Suction available, Patient being monitored and Timeout performed Patient Re-evaluated:Patient Re-evaluated prior to induction Oxygen Delivery Method: Circle system utilized Preoxygenation: Pre-oxygenation with 100% oxygen Induction Type: IV induction, Rapid sequence and Cricoid Pressure applied Laryngoscope Size: Mac and 4 Grade View: Grade I Tube type: Oral Tube size: 7.0 mm Number of attempts: 1 Airway Equipment and Method: Stylet Placement Confirmation: ETT inserted through vocal cords under direct vision, positive ETCO2 and breath sounds checked- equal and bilateral Secured at: 21 cm Tube secured with: Tape Dental Injury: Teeth and Oropharynx as per pre-operative assessment

## 2021-05-02 NOTE — ED Notes (Signed)
EDP bladder scanning pt at the bedside at this time.

## 2021-05-03 ENCOUNTER — Encounter (HOSPITAL_COMMUNITY): Payer: Self-pay | Admitting: Urology

## 2021-05-03 ENCOUNTER — Inpatient Hospital Stay (HOSPITAL_COMMUNITY): Payer: PPO

## 2021-05-03 DIAGNOSIS — D649 Anemia, unspecified: Secondary | ICD-10-CM | POA: Diagnosis not present

## 2021-05-03 DIAGNOSIS — J810 Acute pulmonary edema: Secondary | ICD-10-CM | POA: Diagnosis not present

## 2021-05-03 DIAGNOSIS — N10 Acute tubulo-interstitial nephritis: Secondary | ICD-10-CM

## 2021-05-03 DIAGNOSIS — J9601 Acute respiratory failure with hypoxia: Secondary | ICD-10-CM | POA: Diagnosis not present

## 2021-05-03 DIAGNOSIS — A419 Sepsis, unspecified organism: Secondary | ICD-10-CM | POA: Diagnosis not present

## 2021-05-03 DIAGNOSIS — R6521 Severe sepsis with septic shock: Secondary | ICD-10-CM

## 2021-05-03 LAB — BASIC METABOLIC PANEL
Anion gap: 10 (ref 5–15)
BUN: 39 mg/dL — ABNORMAL HIGH (ref 8–23)
CO2: 16 mmol/L — ABNORMAL LOW (ref 22–32)
Calcium: 7.3 mg/dL — ABNORMAL LOW (ref 8.9–10.3)
Chloride: 113 mmol/L — ABNORMAL HIGH (ref 98–111)
Creatinine, Ser: 2.39 mg/dL — ABNORMAL HIGH (ref 0.44–1.00)
GFR, Estimated: 22 mL/min — ABNORMAL LOW (ref >60)
Glucose, Bld: 118 mg/dL — ABNORMAL HIGH (ref 70–99)
Potassium: 4.3 mmol/L (ref 3.5–5.1)
Sodium: 139 mmol/L (ref 135–145)

## 2021-05-03 LAB — CBC
HCT: 34.8 % — ABNORMAL LOW (ref 36.0–46.0)
Hemoglobin: 11.2 g/dL — ABNORMAL LOW (ref 12.0–15.0)
MCH: 29.5 pg (ref 26.0–34.0)
MCHC: 32.2 g/dL (ref 30.0–36.0)
MCV: 91.6 fL (ref 80.0–100.0)
Platelets: 79 10*3/uL — ABNORMAL LOW (ref 150–400)
RBC: 3.8 MIL/uL — ABNORMAL LOW (ref 3.87–5.11)
RDW: 13.3 % (ref 11.5–15.5)
WBC: 18.2 10*3/uL — ABNORMAL HIGH (ref 4.0–10.5)
nRBC: 0 % (ref 0.0–0.2)

## 2021-05-03 LAB — BLOOD CULTURE ID PANEL (REFLEXED) - BCID2

## 2021-05-03 LAB — HEMOGLOBIN A1C
Hgb A1c MFr Bld: 5.8 % — ABNORMAL HIGH (ref 4.8–5.6)
Mean Plasma Glucose: 119.76 mg/dL

## 2021-05-03 LAB — HIV ANTIBODY (ROUTINE TESTING W REFLEX): HIV Screen 4th Generation wRfx: NONREACTIVE

## 2021-05-03 LAB — MAGNESIUM: Magnesium: 1.4 mg/dL — ABNORMAL LOW (ref 1.7–2.4)

## 2021-05-03 LAB — GLUCOSE, CAPILLARY
Glucose-Capillary: 100 mg/dL — ABNORMAL HIGH (ref 70–99)
Glucose-Capillary: 112 mg/dL — ABNORMAL HIGH (ref 70–99)
Glucose-Capillary: 112 mg/dL — ABNORMAL HIGH (ref 70–99)
Glucose-Capillary: 134 mg/dL — ABNORMAL HIGH (ref 70–99)
Glucose-Capillary: 87 mg/dL (ref 70–99)
Glucose-Capillary: 87 mg/dL (ref 70–99)

## 2021-05-03 LAB — PHOSPHORUS: Phosphorus: 4.5 mg/dL (ref 2.5–4.6)

## 2021-05-03 MED ORDER — CEFTRIAXONE SODIUM 2 G IJ SOLR
2.0000 g | INTRAMUSCULAR | Status: DC
Start: 2021-05-03 — End: 2021-05-06
  Administered 2021-05-03 – 2021-05-05 (×3): 2 g via INTRAVENOUS
  Filled 2021-05-03 (×4): qty 20

## 2021-05-03 MED ORDER — CALCIUM GLUCONATE-NACL 1-0.675 GM/50ML-% IV SOLN
1.0000 g | Freq: Once | INTRAVENOUS | Status: AC
  Administered 2021-05-03: 1000 mg via INTRAVENOUS
  Filled 2021-05-03: qty 50

## 2021-05-03 MED ORDER — MAGNESIUM SULFATE 4 GM/100ML IV SOLN
4.0000 g | Freq: Once | INTRAVENOUS | Status: AC
  Administered 2021-05-03: 4 g via INTRAVENOUS
  Filled 2021-05-03: qty 100

## 2021-05-03 MED ORDER — ACETAMINOPHEN 325 MG PO TABS
650.0000 mg | ORAL_TABLET | ORAL | Status: AC | PRN
  Administered 2021-05-03 – 2021-05-05 (×8): 650 mg via ORAL
  Filled 2021-05-03 (×8): qty 2

## 2021-05-03 NOTE — Progress Notes (Signed)
[facility] Progress Note Patient Name: Carol [name] DOB: 1950-07-16 MRN: [id number]   Date of Service  05/03/2021  HPI/Events of Note  Corrected calcium 7.6 gm / dl, Mg+ 1.4.  eICU Interventions  Electrolytes replaced per E-Link electrolyte replacement protocol.        [name] Haasini Patnaude 05/03/2021, 5:08 AM

## 2021-05-03 NOTE — Progress Notes (Addendum)
NAME:  Carol [name], MRN:  [id number], DOB:  13-May-1951, LOS: 1 ADMISSION DATE:  05/02/2021, CONSULTATION DATE:  05/02/21 REFERRING MD:  [facility], CHIEF COMPLAINT:  shock   History of Present Illness:  [name] is a 70 y/o woman with a history of nephrolithiasis and mild intermittent asthma who presented to the ED 11/10 with LLQ and flank pain, lightheadedness, weakness, and n/v/d. She was diagnosed with a kidney stone 11/9 at a urology appointment after 1 week of abdominal pain. During her visit they had trouble getting a BP but she was able to walk around & she was unable to void at the clinic. Last night when she got up to go to the bathroom in the middle of the night she felt weak and fell forward, hitting her forehead due to weakness and lightheadedness, but was caught before she fell to the floor. She did not lose consciousness. She has a history of previous kidney stones but has not had issues with feeling this bad from them before.  Today she complains of chills. Upon presentation to the ED she was hypotensive and given a fluid bolus. She had a CT scan demonstrating hydronephrosis. To OR per Urology for cystoscopy with left ureteroscopy laser lithotripsy, stone extraction and stent placement.    Pertinent  Medical History  Exercise-induced asthma, only needs meds PRN Nephrolithiasis migraines  Significant Hospital Events: Including procedures, antibiotic start and stop dates in addition to other pertinent events   11/10 admission, urology procedure 11/11 BP stable, sr cr improving   Interim History / Subjective:  Pt reports feeling tired, short of breath when speaking but overall improved  On 2L [place]  I/O 910 UOP, +1.6L in last 24 hours   Objective   Blood pressure (!) 112/52, pulse 98, temperature 98.1 F (36.7 C), temperature source Oral, resp. rate (!) 22, height 5' (1.524 m), weight 69.4 kg, last menstrual period 06/23/1990, SpO2 92 %.        Intake/Output  Summary (Last 24 hours) at 05/03/2021 0726 Last data filed at 05/03/2021 3300 Gross per 24 hour  Intake 2564.91 ml  Output 910 ml  Net 1654.91 ml   Filed Weights   05/02/21 1155  Weight: 69.4 kg    Examination: General:  adult female sitting up in bed in NAD HEENT: MM pink/moist, [place] O2, wearing glasses, anicteric Neuro: AAOx4, speech clear, MAE CV: s1s2 RRR, no m/r/g PULM: non-labored at rest, mild dyspnea with speech, clear anterior, bibasilar crackles GI: soft, bsx4 active  Extremities: warm/dry, dependent edema, RUE IV infiltration with IVF/edematous  Skin: no rashes or lesions   Resolved Hospital Problem list   Lactic Acidosis   Assessment & Plan:   Sepsis due to Klebsiella Pneumoniae UTI / Obstructive Uropathy with associated AKI, thrombocytopenia, transaminase elevation GNR Bacteremia  Hx of Enterococcus  -continue gentle IVF  -change abx to ceftriaxone, discontinue vanco   -appreciate Urology assistance with patient care -follow fever curve, WBC trend  -continue foley until Urology is ok to remove  Acute respiratory failure with hypoxia -wean O2 for sats >90% -assess CXR now to ensure no evolving ARDS given sepsis > images personally reviewed, left basilar atelectasis but no acute infiltrate -pulmonary hygiene -IS, mobilize   AKI Hypomagnesemia  Likely mixed septic and obstructive, but worry about ATN from pre-renal AKI -Trend BMP / urinary output -Replace electrolytes as indicated, 2gm Mg+ 11/11 -Avoid nephrotoxic agents, ensure adequate renal perfusion  Elevated transaminases -follow trend  Hyperglycemia Hemoglobin A1c 5.8  -  glucose controlled   Mild Coagulopathy secondary to Sepsis  Thrombocytopenia  -monitor    Best Practice (right click and "Reselect all SmartList Selections" daily)  Diet/type: Regular consistency (see orders) DVT prophylaxis: prophylactic heparin  GI prophylaxis: N/A Lines: N/A Foley:  Yes, and it is still needed.  Placed  per Urology Code Status:  full code Last date of multidisciplinary goals of care discussion: n/a.  Patient, wife updated at bedside.   To TR[facility] as of 11/12 am. PCCM will sign off.   Critical care time: n/a     Noe Gens, MSN, APRN, NP-C, AGACNP-BC [facility] 05/03/2021, 7:26 AM   Please see [email or web address] for pager details.   From 7A-7P if no response, please call [phone] After hours, please call [facility] [phone]   Attending attestation: Mrs. Carol [name] is a 70 y/o woman with nephrolithiasis causing obstructive hydrocephalus and GNR bacteremia and UTI. She required pressors short-term overnight. This morning she is feeling better but had some nausea after breakfast. Some SOB today- better since this morning.  BP (!) 91/42   Pulse (!) 104   Temp 98.5 F (36.9 C) (Oral)   Resp (!) 23   Ht 5' (1.524 m)   Wt 69.4 kg   LMP 06/23/1990 (Approximate)   SpO2 97%   BMI 29.88 kg/m  Ill appearing woman lying in bed in NAD [place]/AT, eyes anicteric.  S1-S2, minimally tachycardic, regular rhythm Breathing comfortably on nasal cannula, CTAB anteriorly. Abdomen soft, nontender, no guarding Face more plethoric, skin with better coloring today. No rashes. Breathing comfortable Mild extremity edema, no clubbing or cyanosis Awake, alert, answering questions appropriately  Blood cultures-Klebsiella pneumoniae  urine culture-Klebsiella pneumoniae WBC 18.2 H/H 11.2/34.8 Platelets 79 BUN 39 Cr 2.39 Mg 1.4  Assessment & plan: Septic shock due to Klebsiella pneumonia i.e. UTI and bacteremia.  Shock resolved.  Associated AKI, thrombocytopenia, and transaminase elevation. - Okay to de-escalate antibiotics to ceftriaxone. - Need to discuss with urology-May need stay on antibiotics until her stent is removed - Foley to remain in place until cleared by urology.  Acute hypoxic respiratory failure due to acute pulmonary edema from volume resuscitation - As blood pressure  improves we will diuresis Lasix - Supplemental oxygen as required to maintain SPO2 greater than 90%.  AKI due to sepsis - Strict I's/O - Renally dose meds and avoid nephrotoxic meds - Continue to monitor  Hypomagnesemia - Repleted - Monitor  Prediabetes A1c 5.8 - SSI as needed - Goal BG less than 180  Acute anemia due to blood loss related to procedures, dilution - Transfuse for hemoglobin less than 7 or hemodynamically significant bleeding - Monitor  Acute thrombocytopenia due to sepsis - Hold DVT prophylaxis today - Continue to monitor  Deconditioning - Mobility as able - PT, OT  Nausea - Zofran and Phenergan as needed - P.o. intake as able  Stable to step down from the ICU today.  TRH to assume care tomorrow.  Julian Hy, DO 05/03/21 2:32 PM [facility]

## 2021-05-03 NOTE — Progress Notes (Signed)
PHARMACY - PHYSICIAN COMMUNICATION CRITICAL VALUE ALERT - BLOOD CULTURE IDENTIFICATION (BCID)  Carol [name] is an 70 y.o. female who presented to [facility] on 05/02/2021 with a chief complaint of urosepsis, kidney stone.   S/p Cystoscopy with left retrograde pyelogram, left ureteroscopy laser lithotripsy, stone basket extraction, stent placement on 11/10.    Assessment: Urosepsis.  BCx with Klebsiella pneumoniae   Name of physician (or Provider) Contacted: Dr. Carlis Abbott  Current antibiotics: Cefepime, Vancomycin  Changes to prescribed antibiotics recommended:  Recommendations accepted by provider Narrow to Ceftriaxone 2g IV Q24h  Results for orders placed or performed during the hospital encounter of 05/02/21  Blood Culture ID Panel (Reflexed) (Collected: 05/02/2021 12:00 PM)  Result Value Ref Range   Enterococcus faecalis NOT DETECTED NOT DETECTED   Enterococcus Faecium NOT DETECTED NOT DETECTED   Listeria monocytogenes NOT DETECTED NOT DETECTED   Staphylococcus species NOT DETECTED NOT DETECTED   Staphylococcus aureus (BCID) NOT DETECTED NOT DETECTED   Staphylococcus epidermidis NOT DETECTED NOT DETECTED   Staphylococcus lugdunensis NOT DETECTED NOT DETECTED   Streptococcus species NOT DETECTED NOT DETECTED   Streptococcus agalactiae NOT DETECTED NOT DETECTED   Streptococcus pneumoniae NOT DETECTED NOT DETECTED   Streptococcus pyogenes NOT DETECTED NOT DETECTED   A.calcoaceticus-baumannii NOT DETECTED NOT DETECTED   Bacteroides fragilis NOT DETECTED NOT DETECTED   Enterobacterales DETECTED (A) NOT DETECTED   Enterobacter cloacae complex NOT DETECTED NOT DETECTED   Escherichia coli NOT DETECTED NOT DETECTED   Klebsiella aerogenes NOT DETECTED NOT DETECTED   Klebsiella oxytoca NOT DETECTED NOT DETECTED   Klebsiella pneumoniae DETECTED (A) NOT DETECTED   Proteus species NOT DETECTED NOT DETECTED   Salmonella species NOT DETECTED NOT DETECTED   Serratia marcescens NOT  DETECTED NOT DETECTED   Haemophilus influenzae NOT DETECTED NOT DETECTED   Neisseria meningitidis NOT DETECTED NOT DETECTED   Pseudomonas aeruginosa NOT DETECTED NOT DETECTED   Stenotrophomonas maltophilia NOT DETECTED NOT DETECTED   Candida albicans NOT DETECTED NOT DETECTED   Candida auris NOT DETECTED NOT DETECTED   Candida glabrata NOT DETECTED NOT DETECTED   Candida krusei NOT DETECTED NOT DETECTED   Candida parapsilosis NOT DETECTED NOT DETECTED   Candida tropicalis NOT DETECTED NOT DETECTED   Cryptococcus neoformans/gattii NOT DETECTED NOT DETECTED   CTX-M ESBL NOT DETECTED NOT DETECTED   Carbapenem resistance IMP NOT DETECTED NOT DETECTED   Carbapenem resistance KPC NOT DETECTED NOT DETECTED   Carbapenem resistance NDM NOT DETECTED NOT DETECTED   Carbapenem resist OXA 48 LIKE NOT DETECTED NOT DETECTED   Carbapenem resistance VIM NOT DETECTED NOT DETECTED    Gretta Arab PharmD, BCPS Clinical Pharmacist [facility] main pharmacy [phone] 05/03/2021 8:44 AM

## 2021-05-03 NOTE — Progress Notes (Signed)
1 Day Post-Op Subjective: Patient reports she continues to feel tired and weak.  Blood cultures with Klebsiella.  Objective: Vital signs in last 24 hours: Temp:  [97.8 F (36.6 C)-98.8 F (37.1 C)] 98.5 F (36.9 C) (11/11 1200) Pulse Rate:  [89-129] 104 (11/11 1400) Resp:  [14-27] 23 (11/11 1400) BP: (79-150)/(42-109) 91/42 (11/11 1400) SpO2:  [90 %-98 %] 97 % (11/11 1400)  Intake/Output from previous day: 11/10 0701 - 11/11 0700 In: 2521.6 [P.O.:120; I.V.:2336.9; IV Piggyback:64.6] Out: 910 [Urine:910] Intake/Output this shift: Total I/O In: 553 [P.O.:120; I.V.:197.7; IV Piggyback:235.3] Out: 340 [Urine:340]  Physical Exam:  She is alert and oriented, talking with Madaline Savage Urine clear in the Foley bag, stent string secured to the Foley  Lab Results: Recent Labs    05/02/21 1216 05/03/21 0254  HGB 13.0 11.2*  HCT 39.9 34.8*   BMET Recent Labs    05/02/21 1216 05/03/21 0254  NA 143 139  K 4.1 4.3  CL 112* 113*  CO2 18* 16*  GLUCOSE 113* 118*  BUN 41* 39*  CREATININE 3.99* 2.39*  CALCIUM 8.2* 7.3*   Recent Labs    05/02/21 1228  INR 1.4*   No results for input(s): LABURIN in the last 72 hours. Results for orders placed or performed during the hospital encounter of 05/02/21  Blood Culture (routine x 2)     Status: None (Preliminary result)   Collection Time: 05/02/21 12:00 PM   Specimen: BLOOD  Result Value Ref Range Status   Specimen Description   Final    BLOOD LEFT ANTECUBITAL Performed at [facility] [address] [place], [place] [address]    Special Requests   Final    BOTTLES DRAWN AEROBIC AND ANAEROBIC Blood Culture results may not be optimal due to an inadequate volume of blood received in culture bottles Performed at [facility] [address], [place], [place] [address]    Culture  Setup Time   Final    GRAM NEGATIVE RODS IN BOTH AEROBIC AND ANAEROBIC BOTTLES Organism ID to follow CRITICAL  RESULT CALLED TO, READ BACK BY AND VERIFIED WITH: Sanda Klein, AT 6226 05/03/21 [place] Performed at [facility], [place] [address], [place], [place] [address]    Culture GRAM NEGATIVE RODS  Final   Report Status PENDING  Incomplete  Blood Culture ID Panel (Reflexed)     Status: Abnormal   Collection Time: 05/02/21 12:00 PM  Result Value Ref Range Status   Enterococcus faecalis NOT DETECTED NOT DETECTED Final   Enterococcus Faecium NOT DETECTED NOT DETECTED Final   Listeria monocytogenes NOT DETECTED NOT DETECTED Final   Staphylococcus species NOT DETECTED NOT DETECTED Final   Staphylococcus aureus (BCID) NOT DETECTED NOT DETECTED Final   Staphylococcus epidermidis NOT DETECTED NOT DETECTED Final   Staphylococcus lugdunensis NOT DETECTED NOT DETECTED Final   Streptococcus species NOT DETECTED NOT DETECTED Final   Streptococcus agalactiae NOT DETECTED NOT DETECTED Final   Streptococcus pneumoniae NOT DETECTED NOT DETECTED Final   Streptococcus pyogenes NOT DETECTED NOT DETECTED Final   A.calcoaceticus-baumannii NOT DETECTED NOT DETECTED Final   Bacteroides fragilis NOT DETECTED NOT DETECTED Final   Enterobacterales DETECTED (A) NOT DETECTED Final    Comment: Enterobacterales represent a large order of gram negative bacteria, not a single organism. CRITICAL RESULT CALLED TO, READ BACK BY AND VERIFIED WITH: Colin Rhein PHARMD, AT 5625 05/03/21 D. VANHOOK    Enterobacter cloacae complex NOT DETECTED NOT DETECTED Final  Escherichia coli NOT DETECTED NOT DETECTED Final   Klebsiella aerogenes NOT DETECTED NOT DETECTED Final   Klebsiella oxytoca NOT DETECTED NOT DETECTED Final   Klebsiella pneumoniae DETECTED (A) NOT DETECTED Final    Comment: CRITICAL RESULT CALLED TO, READ BACK BY AND VERIFIED WITH: Colin Rhein PHARMD, AT 8527 05/03/21 D. VANHOOK    Proteus species NOT DETECTED NOT DETECTED Final   Salmonella species NOT DETECTED NOT DETECTED Final   Serratia marcescens NOT DETECTED  NOT DETECTED Final   Haemophilus influenzae NOT DETECTED NOT DETECTED Final   Neisseria meningitidis NOT DETECTED NOT DETECTED Final   Pseudomonas aeruginosa NOT DETECTED NOT DETECTED Final   Stenotrophomonas maltophilia NOT DETECTED NOT DETECTED Final   Candida albicans NOT DETECTED NOT DETECTED Final   Candida auris NOT DETECTED NOT DETECTED Final   Candida glabrata NOT DETECTED NOT DETECTED Final   Candida krusei NOT DETECTED NOT DETECTED Final   Candida parapsilosis NOT DETECTED NOT DETECTED Final   Candida tropicalis NOT DETECTED NOT DETECTED Final   Cryptococcus neoformans/gattii NOT DETECTED NOT DETECTED Final   CTX-M ESBL NOT DETECTED NOT DETECTED Final   Carbapenem resistance IMP NOT DETECTED NOT DETECTED Final   Carbapenem resistance KPC NOT DETECTED NOT DETECTED Final   Carbapenem resistance NDM NOT DETECTED NOT DETECTED Final   Carbapenem resist OXA 48 LIKE NOT DETECTED NOT DETECTED Final   Carbapenem resistance VIM NOT DETECTED NOT DETECTED Final    Comment: Performed at [facility], [address], [place], [place] [address]  Blood Culture (routine x 2)     Status: None (Preliminary result)   Collection Time: 05/02/21 12:15 PM   Specimen: BLOOD  Result Value Ref Range Status   Specimen Description   Final    BLOOD LEFT ANTECUBITAL Performed at [facility] [address], [place], [place] [address]    Special Requests   Final    BOTTLES DRAWN AEROBIC AND ANAEROBIC Blood Culture results may not be optimal due to an inadequate volume of blood received in culture bottles Performed at [facility] [address] [place], [place] [address]    Culture  Setup Time   Final    GRAM NEGATIVE RODS AEROBIC BOTTLE ONLY CRITICAL VALUE NOTED.  VALUE IS CONSISTENT WITH PREVIOUSLY REPORTED AND CALLED VALUE. Performed at [facility], [place] [address] [place], [place] [address]    Culture GRAM NEGATIVE RODS  Final   Report Status  PENDING  Incomplete  Resp Panel by RT-PCR (Flu A&B, Covid) Nasopharyngeal Swab     Status: None   Collection Time: 05/02/21 12:16 PM   Specimen: Nasopharyngeal Swab; Nasopharyngeal(NP) swabs in vial transport medium  Result Value Ref Range Status   SARS Coronavirus 2 by RT PCR NEGATIVE NEGATIVE Final    Comment: (NOTE) SARS-CoV-2 target nucleic acids are NOT DETECTED.  The SARS-CoV-2 RNA is generally detectable in upper respiratory specimens during the acute phase of infection. The lowest concentration of SARS-CoV-2 viral copies this assay can detect is 138 copies/mL. A negative result does not preclude SARS-Cov-2 infection and should not be used as the sole basis for treatment or other patient management decisions. A negative result may occur with  improper specimen collection/handling, submission of specimen other than nasopharyngeal swab, presence of viral mutation(s) within the areas targeted by this assay, and inadequate number of viral copies(<138 copies/mL). A negative result must be combined with clinical observations, patient history, and epidemiological information. The expected result is Negative.  Fact Sheet for Patients:  EntrepreneurPulse.com.au  Fact Sheet for Healthcare Providers:  IncredibleEmployment.be  This test is no t yet approved or cleared by the [place] FDA and  has been authorized for detection and/or diagnosis of SARS-CoV-2 by FDA under an Emergency Use Authorization (EUA). This EUA will remain  in effect (meaning this test can be used) for the duration of the COVID-19 declaration under Section 564(b)(1) of the Act, 21 U.S.C.section 360bbb-3(b)(1), unless the authorization is terminated  or revoked sooner.       Influenza A by PCR NEGATIVE NEGATIVE Final   Influenza B by PCR NEGATIVE NEGATIVE Final    Comment: (NOTE) The Xpert Xpress SARS-CoV-2/FLU/RSV plus assay is intended as an aid in the diagnosis of  influenza from Nasopharyngeal swab specimens and should not be used as a sole basis for treatment. Nasal washings and aspirates are unacceptable for Xpert Xpress SARS-CoV-2/FLU/RSV testing.  Fact Sheet for Patients: EntrepreneurPulse.com.au  Fact Sheet for Healthcare Providers: IncredibleEmployment.be  This test is not yet approved or cleared by the [place] FDA and has been authorized for detection and/or diagnosis of SARS-CoV-2 by FDA under an Emergency Use Authorization (EUA). This EUA will remain in effect (meaning this test can be used) for the duration of the COVID-19 declaration under Section 564(b)(1) of the Act, 21 U.S.C. section 360bbb-3(b)(1), unless the authorization is terminated or revoked.  Performed at [facility], [place] [address], [place], [place] [address]   Urine Culture     Status: Abnormal (Preliminary result)   Collection Time: 05/02/21 12:16 PM   Specimen: In/Out Cath Urine  Result Value Ref Range Status   Specimen Description   Final    IN/OUT CATH URINE Performed at [facility] [address], [place], [place] [address]    Special Requests   Final    NONE Performed at [facility], [place] [address], [place], [place] [address]    Culture (A)  Final    >=100,000 COLONIES/mL KLEBSIELLA PNEUMONIAE SUSCEPTIBILITIES TO FOLLOW Performed at [facility], [place] [address] [place], [place] [address]    Report Status PENDING  Incomplete  MRSA Next Gen by PCR, Nasal     Status: None   Collection Time: 05/02/21  6:29 PM   Specimen: Nasal Mucosa; Nasal Swab  Result Value Ref Range Status   MRSA by PCR Next Gen NOT DETECTED NOT DETECTED Final    Comment: (NOTE) The GeneXpert MRSA Assay (FDA approved for NASAL specimens only), is one component of a comprehensive MRSA colonization surveillance program. It is not intended to diagnose MRSA infection nor to  guide or monitor treatment for MRSA infections. Test performance is not FDA approved in patients less than 67 years old. Performed at [facility], [place] [address], [place], [place] [address]     Studies/Results: US RENAL  Result Date: 05/02/2021 CLINICAL DATA:  Hydronephrosis. Recent kidney stone diagnosis. Numerous lithotripsies. Stone removal. EXAM: RENAL / URINARY TRACT ULTRASOUND COMPLETE COMPARISON:  01/02/2018 CT FINDINGS: Right Kidney: Renal measurements: 8.9 x 3.9 x 5.7 cm = volume: 103 mL. Echogenicity within normal limits. No mass or hydronephrosis visualized. Left Kidney: Renal measurements: 10.9 x 5.6 x 7.2 cm = volume: 234 mL. Echogenicity within normal limits. No mass or hydronephrosis visualized. Bladder: Decompressed. Other: None. IMPRESSION: No evidence of hydronephrosis.  Normal renal ultrasound for age. Electronically Signed   By: Abigail Miyamoto M.D.   On: 05/02/2021 14:06   DG CHEST  PORT 1 VIEW  Result Date: 05/03/2021 CLINICAL DATA:  Dyspnea 1 EXAM: PORTABLE CHEST 1 VIEW COMPARISON:  Prior chest x-ray 05/02/2021 FINDINGS: Stable cardiac and mediastinal contours. Improved inspiration. Linear opacity in the retrocardiac region likely reflects residual atelectasis. No pleural effusion, pneumothorax or focal airspace infiltrate. IMPRESSION: Improved aeration with larger lung volumes. Persistent linear opacity in the left lung base favored to reflect atelectasis. Electronically Signed   By: Jacqulynn Cadet M.D.   On: 05/03/2021 09:17   DG Chest Port 1 View  Result Date: 05/02/2021 CLINICAL DATA:  Questionable sepsis - evaluate for abnormality EXAM: PORTABLE CHEST 1 VIEW COMPARISON:  06/28/2014. FINDINGS: Low lung volumes. No consolidation. No visible pleural effusions or pneumothorax. Mildly enlarged cardiomediastinal silhouette, likely exaggerated by low lung volumes and AP technique. No acute osseous abnormality. IMPRESSION: Low lung volumes limits  evaluation of the lung bases without definite evidence of acute cardiopulmonary disease. Dedicated PA and lateral radiographs could better evaluate if clinically indicated. Electronically Signed   By: Margaretha Sheffield M.D.   On: 05/02/2021 13:37   DG C-Arm 1-60 Min-No Report  Result Date: 05/02/2021 Fluoroscopy was utilized by the requesting physician.  No radiographic interpretation.   [place] EKG SITE RITE  Result Date: 05/02/2021 If Site Rite image not attached, placement could not be confirmed due to current cardiac rhythm.   Assessment/Plan: Left ureteral stone-sepsis-patient status post left ureteroscopy laser lithotripsy and stent placement.  I taped the stent to the string.  Foley could be removed Sunday morning, 05/05/2021.  Discussed follow-up with [name] and Madaline Savage. Number on chart.  I notified Dr. Tresa Moore of her progress.   LOS: 1 day   Festus Aloe 05/03/2021, 2:52 PM

## 2021-05-04 DIAGNOSIS — N390 Urinary tract infection, site not specified: Secondary | ICD-10-CM

## 2021-05-04 DIAGNOSIS — B961 Klebsiella pneumoniae [K. pneumoniae] as the cause of diseases classified elsewhere: Secondary | ICD-10-CM

## 2021-05-04 DIAGNOSIS — B9689 Other specified bacterial agents as the cause of diseases classified elsewhere: Secondary | ICD-10-CM

## 2021-05-04 DIAGNOSIS — N139 Obstructive and reflux uropathy, unspecified: Secondary | ICD-10-CM

## 2021-05-04 DIAGNOSIS — R7401 Elevation of levels of liver transaminase levels: Secondary | ICD-10-CM

## 2021-05-04 DIAGNOSIS — D696 Thrombocytopenia, unspecified: Secondary | ICD-10-CM

## 2021-05-04 DIAGNOSIS — J9601 Acute respiratory failure with hypoxia: Secondary | ICD-10-CM

## 2021-05-04 DIAGNOSIS — R7881 Bacteremia: Secondary | ICD-10-CM

## 2021-05-04 DIAGNOSIS — N179 Acute kidney failure, unspecified: Secondary | ICD-10-CM

## 2021-05-04 LAB — URINE CULTURE: Culture: >=100000 — AB

## 2021-05-04 LAB — COMPREHENSIVE METABOLIC PANEL
ALT: 114 U/L — ABNORMAL HIGH (ref 0–44)
AST: 89 U/L — ABNORMAL HIGH (ref 15–41)
Albumin: 2.6 g/dL — ABNORMAL LOW (ref 3.5–5.0)
Alkaline Phosphatase: 191 U/L — ABNORMAL HIGH (ref 38–126)
Anion gap: 9 (ref 5–15)
BUN: 43 mg/dL — ABNORMAL HIGH (ref 8–23)
CO2: 17 mmol/L — ABNORMAL LOW (ref 22–32)
Calcium: 7.8 mg/dL — ABNORMAL LOW (ref 8.9–10.3)
Chloride: 112 mmol/L — ABNORMAL HIGH (ref 98–111)
Creatinine, Ser: 1.98 mg/dL — ABNORMAL HIGH (ref 0.44–1.00)
GFR, Estimated: 27 mL/min — ABNORMAL LOW (ref >60)
Glucose, Bld: 93 mg/dL (ref 70–99)
Potassium: 3.7 mmol/L (ref 3.5–5.1)
Sodium: 138 mmol/L (ref 135–145)
Total Bilirubin: 0.7 mg/dL (ref 0.3–1.2)
Total Protein: 5.4 g/dL — ABNORMAL LOW (ref 6.5–8.1)

## 2021-05-04 LAB — CBC
HCT: 32.1 % — ABNORMAL LOW (ref 36.0–46.0)
Hemoglobin: 10.9 g/dL — ABNORMAL LOW (ref 12.0–15.0)
MCH: 30 pg (ref 26.0–34.0)
MCHC: 34 g/dL (ref 30.0–36.0)
MCV: 88.4 fL (ref 80.0–100.0)
Platelets: 67 10*3/uL — ABNORMAL LOW (ref 150–400)
RBC: 3.63 MIL/uL — ABNORMAL LOW (ref 3.87–5.11)
RDW: 13.3 % (ref 11.5–15.5)
WBC: 15.3 10*3/uL — ABNORMAL HIGH (ref 4.0–10.5)
nRBC: 0 % (ref 0.0–0.2)

## 2021-05-04 LAB — GLUCOSE, CAPILLARY
Glucose-Capillary: 94 mg/dL (ref 70–99)
Glucose-Capillary: 96 mg/dL (ref 70–99)
Glucose-Capillary: 94 mg/dL (ref 70–99)
Glucose-Capillary: 106 mg/dL — ABNORMAL HIGH (ref 70–99)
Glucose-Capillary: 155 mg/dL — ABNORMAL HIGH (ref 70–99)

## 2021-05-04 LAB — MAGNESIUM: Magnesium: 2.8 mg/dL — ABNORMAL HIGH (ref 1.7–2.4)

## 2021-05-04 LAB — CALCIUM, IONIZED: Calcium, Ionized, Serum: 4.3 mg/dL — ABNORMAL LOW (ref 4.5–5.6)

## 2021-05-04 MED ORDER — SUMATRIPTAN SUCCINATE 50 MG PO TABS
50.0000 mg | ORAL_TABLET | Freq: Once | ORAL | Status: AC
  Administered 2021-05-04: 50 mg via ORAL
  Filled 2021-05-04: qty 1

## 2021-05-04 NOTE — Assessment & Plan Note (Addendum)
Given magnesium repletion. Resolved.

## 2021-05-04 NOTE — Evaluation (Signed)
Occupational Therapy Evaluation Patient Details Name: Carol [name] MRN: [id number] DOB: 1951-04-24 Today's Date: 05/04/2021   History of Present Illness Pt is a 70 year old woman admitted on 05/02/21 with weakness, lightheadedness, fall, N/V/D. +urosepis and kidney stone, hypotensive. Underwent L urteroscopy, lithrotripsy and stent placement on 05/02/21. PMH: nephrolitiasis, mild asthma, headaches, OA and anxiety.   Clinical Impression   Pt was independent prior to admission. Presents with abdominal pain, generalized weakness and impaired standing balance. Pt requires up to min assist for all mobility and use of RW, set up to total assist for ADL. Abdominal pain interfering with pt's ability to cross foot over opposite knee to reach feet.  Pt has excellent support at home, likely to progress well.      Recommendations for follow up therapy are one component of a multi-disciplinary discharge planning process, led by the attending physician.  Recommendations may be updated based on patient status, additional functional criteria and insurance authorization.   Follow Up Recommendations  No OT follow up    Assistance Recommended at Discharge None  Functional Status Assessment     Equipment Recommendations  None recommended by OT    Recommendations for Other Services       Precautions / Restrictions Precautions Precautions: Fall Precaution Comments: watch BP      Mobility Bed Mobility Overal bed mobility: Needs Assistance Bed Mobility: Rolling;Sidelying to Sit Rolling: Supervision Sidelying to sit: Min assist       General bed mobility comments: min assist to raise trunk, HOB up    Transfers Overall transfer level: Needs assistance Equipment used: Rolling walker (2 wheels) Transfers: Sit to/from Stand Sit to Stand: Min assist           General transfer comment: cues for hand placement, min assist to rise and steady      Balance Overall balance  assessment: Needs assistance   Sitting balance-Leahy Scale: Fair       Standing balance-Leahy Scale: Poor Standing balance comment: reliant on B UE support of walker                           ADL either performed or assessed with clinical judgement   ADL Overall ADL's : Needs assistance/impaired Eating/Feeding: Independent;Sitting   Grooming: Brushing hair;Sitting;Set up   Upper Body Bathing: Minimal assistance;Sitting   Lower Body Bathing: Maximal assistance;Sit to/from stand   Upper Body Dressing : Minimal assistance;Sitting   Lower Body Dressing: Total assistance;Bed level   Toilet Transfer: Minimal assistance;Ambulation;Rolling walker (2 wheels)           Functional mobility during ADLs: Minimal assistance;Rolling walker (2 wheels)       Vision Baseline Vision/History: 1 Wears glasses Ability to See in Adequate Light: 0 Adequate Patient Visual Report: No change from baseline       Perception     Praxis      Pertinent Vitals/Pain Pain Assessment: 0-10 Pain Score: 4  Pain Location: abdomen with coughing Pain Descriptors / Indicators: Sore;Discomfort;Guarding Pain Intervention(s): Limited activity within patient's tolerance;Monitored during session;Premedicated before session     Hand Dominance Right   Extremity/Trunk Assessment Upper Extremity Assessment Upper Extremity Assessment: Overall WFL for tasks assessed   Lower Extremity Assessment Lower Extremity Assessment: Defer to PT evaluation   Cervical / Trunk Assessment Cervical / Trunk Assessment: Normal   Communication Communication Communication: No difficulties   Cognition Arousal/Alertness: Awake/alert Behavior During Therapy: WFL for tasks assessed/performed Overall Cognitive  Status: Within Functional Limits for tasks assessed                                       General Comments       Exercises     Shoulder Instructions      Home Living  Family/patient expects to be discharged to:: Private residence Living Arrangements: Spouse/significant other Available Help at Discharge: Family;Available 24 hours/day Type of Home: House Home Access: Stairs to enter [employer] of Steps: 3   Home Layout: Two level;Bed/bath upstairs Alternate Level Stairs-Number of Steps: flight   Bathroom Shower/Tub: Teacher, early years/pre: Handicapped height     Home Equipment: Other (comment) (knee scooter)          Prior Functioning/Environment Prior Level of Function : Independent/Modified Independent                        OT Problem List: Decreased strength;Decreased activity tolerance;Impaired balance (sitting and/or standing);Decreased knowledge of use of DME or AE;Pain      OT Treatment/Interventions: Self-care/ADL training;DME and/or AE instruction;Therapeutic activities;Balance training;Patient/family education    OT Goals(Current goals can be found in the care plan section) Acute Rehab OT Goals OT Goal Formulation: With patient Time For Goal Achievement: 05/18/21 Potential to Achieve Goals: Good ADL Goals Pt Will Perform Grooming: with supervision;standing Pt Will Perform Lower Body Bathing: with supervision;sit to/from stand Pt Will Perform Lower Body Dressing: with supervision;sit to/from stand Pt Will Transfer to Toilet: with supervision;ambulating;regular height toilet Pt Will Perform Toileting - Clothing Manipulation and hygiene: with supervision;sit to/from stand Additional ADL Goal #1: Pt will perform bed mobility independentl with HOB flat.  OT Frequency: Min 2X/week   Barriers to D/C:            Co-evaluation              AM-PAC OT "6 Clicks" Daily Activity     Outcome Measure Help from another person eating meals?: None Help from another person taking care of personal grooming?: A Little Help from another person toileting, which includes using toliet, bedpan, or urinal?:  A Lot Help from another person bathing (including washing, rinsing, drying)?: A Lot Help from another person to put on and taking off regular upper body clothing?: A Little Help from another person to put on and taking off regular lower body clothing?: Total 6 Click Score: 15   End of Session Equipment Utilized During Treatment: Rolling walker (2 wheels) Nurse Communication: Mobility status;Other (comment) (BP)  Activity Tolerance: Patient tolerated treatment well Patient left: in chair;with call bell/phone within reach;with family/visitor present  OT Visit Diagnosis: Unsteadiness on feet (R26.81);Other abnormalities of gait and mobility (R26.89);Pain;Muscle weakness (generalized) (M62.81)                Time: 1610-9604 OT Time Calculation (min): 31 min Charges:  OT General Charges $OT Visit: 1 Visit OT Evaluation $OT Eval Moderate Complexity: 1 Mod  Nestor Lewandowsky, OTR/L Acute Rehabilitation Services Pager: [phone] Office: [phone]   Malka So 05/04/2021, 1:24 PM

## 2021-05-04 NOTE — Evaluation (Signed)
Physical Therapy Evaluation Patient Details Name: Carol [name] MRN: [id number] DOB: 10/06/1950 Today's Date: 05/04/2021  History of Present Illness  Pt is a 70 year old woman admitted on 05/02/21 with weakness, lightheadedness, fall, N/V/D. +urosepis and kidney stone, hypotensive. Underwent L urteroscopy, lithrotripsy and stent placement on 05/02/21. PMH: nephrolitiasis, mild asthma, headaches, OA and anxiety.  Clinical Impression  Pt admitted as above and presents with functional mobility limitations 2* post-op abdominal pain and ambulatory balance deficits.  Pt very motivated and should progress well to dc home with family assist.     Recommendations for follow up therapy are one component of a multi-disciplinary discharge planning process, led by the attending physician.  Recommendations may be updated based on patient status, additional functional criteria and insurance authorization.  Follow Up Recommendations No PT follow up    Assistance Recommended at Discharge Frequent or constant Supervision/Assistance  Functional Status Assessment Patient has had a recent decline in their functional status and demonstrates the ability to make significant improvements in function in a reasonable and predictable amount of time.  Equipment Recommendations  None recommended by PT    Recommendations for Other Services       Precautions / Restrictions Precautions Precautions: Fall Precaution Comments: watch BP Restrictions Weight Bearing Restrictions: No      Mobility  Bed Mobility Overal bed mobility: Needs Assistance Bed Mobility: Rolling;Sidelying to Sit Rolling: Supervision Sidelying to sit: Min assist       General bed mobility comments: min assist to raise trunk, HOB up    Transfers Overall transfer level: Needs assistance Equipment used: Rolling walker (2 wheels) Transfers: Sit to/from Stand Sit to Stand: Min assist           General transfer comment: cues  for hand placement, min assist to rise and steady    Ambulation/Gait Ambulation/Gait assistance: Min assist;Min guard Gait Distance (Feet): 200 Feet Assistive device: Rolling walker (2 wheels) Gait Pattern/deviations: Step-through pattern;Decreased step length - right;Decreased step length - left;Shuffle;Trunk flexed Gait velocity: mod pace     General Gait Details: cues for posture and position from [employer]            Wheelchair Mobility    Modified Rankin (Stroke Patients Only)       Balance Overall balance assessment: Needs assistance Sitting-balance support: No upper extremity supported;Feet supported Sitting balance-Leahy Scale: Fair     Standing balance support: Bilateral upper extremity supported Standing balance-Leahy Scale: Poor Standing balance comment: reliant on B UE support of walker                             Pertinent Vitals/Pain Pain Assessment: 0-10 Pain Score: 4  Pain Location: abdomen with coughing Pain Descriptors / Indicators: Sore;Discomfort;Guarding Pain Intervention(s): Limited activity within patient's tolerance;Monitored during session;Premedicated before session    Home Living Family/patient expects to be discharged to:: Private residence Living Arrangements: Spouse/significant other Available Help at Discharge: Family;Available 24 hours/day Type of Home: House Home Access: Stairs to enter Entrance Stairs-Rails: Right Entrance Stairs-Number of Steps: 3 Alternate Level Stairs-Number of Steps: flight Home Layout: Two level;Bed/bath upstairs Home Equipment: Other (comment) (knee scooter)      Prior Function Prior Level of Function : Independent/Modified Independent                     Hand Dominance   Dominant Hand: Right    Extremity/Trunk Assessment   Upper Extremity  Assessment Upper Extremity Assessment: Overall WFL for tasks assessed    Lower Extremity Assessment Lower Extremity Assessment:  Overall WFL for tasks assessed    Cervical / Trunk Assessment Cervical / Trunk Assessment: Normal  Communication   Communication: No difficulties  Cognition Arousal/Alertness: Awake/alert Behavior During Therapy: WFL for tasks assessed/performed Overall Cognitive Status: Within Functional Limits for tasks assessed                                          General Comments      Exercises     Assessment/Plan    PT Assessment Patient needs continued PT services  PT Problem List Decreased balance;Decreased mobility;Pain       PT Treatment Interventions DME instruction;Gait training;Functional mobility training;Stair training;Therapeutic activities;Therapeutic exercise;Patient/family education;Balance training    PT Goals (Current goals can be found in the Care Plan section)  Acute Rehab PT Goals Patient Stated Goal: Regain IND PT Goal Formulation: With patient Time For Goal Achievement: 05/18/21 Potential to Achieve Goals: Good    Frequency Min 3X/week   Barriers to discharge        Co-evaluation               AM-PAC PT "6 Clicks" Mobility  Outcome Measure Help needed turning from your back to your side while in a flat bed without using bedrails?: A Little Help needed moving from lying on your back to sitting on the side of a flat bed without using bedrails?: A Little Help needed moving to and from a bed to a chair (including a wheelchair)?: A Little Help needed standing up from a chair using your arms (e.g., wheelchair or bedside chair)?: A Little Help needed to walk in hospital room?: A Little Help needed climbing 3-5 steps with a railing? : A Lot 6 Click Score: 17    End of Session   Activity Tolerance: Patient tolerated treatment well Patient left: in chair;with call bell/phone within reach;with chair alarm set;with family/visitor present Nurse Communication: Mobility status PT Visit Diagnosis: Unsteadiness on feet (R26.81);Difficulty in  walking, not elsewhere classified (R26.2)    Time: 1340-1410 PT Time Calculation (min) (ACUTE ONLY): 30 min   Charges:   PT Evaluation $PT Eval Low Complexity: 1 Low          Mauro Kaufmann PT Acute Rehabilitation Services Pager [phone] Office [phone]   Pamula Luther 05/04/2021, 3:34 PM

## 2021-05-04 NOTE — Assessment & Plan Note (Signed)
Secondary to UTI/Pyelonephritis. Antibiotics as mentioned above.

## 2021-05-04 NOTE — Assessment & Plan Note (Addendum)
Acute. Likely secondary to sepsis/infection. Subcutaneous heparin discontinued. Improving prior to discharge.

## 2021-05-04 NOTE — Assessment & Plan Note (Addendum)
Patient seen and evaluated by urology. Cystoscopy with left ureteroscopy laser lithotripsy/stone extraction/stent placement performed on 11/10. Urology recommendation to remove foley in 3-5 days or prior to discharge, whichever is longer. Foley removed on 11/14.

## 2021-05-04 NOTE — Progress Notes (Signed)
PROGRESS NOTE    Carol [name]  DJM:[id number] DOB: [birth year] DOA: 05/02/2021 PCP: Denita Lung, MD   Brief Narrative: Carol [name] is a 70 y.o. female with a history of nephrolithiasis and mild intermittent asthma. Patient presented secondary to LLQ/flank pain, nausea/vomiting/diarrhea. She was diagnosed with Sepsis with associated end organ damage. Empiric vancomycin and Cefepime initiated. Urology consulted for left obstructive uropathy and patient underwent cystoscopy with stone removal and stent. Blood and urine cultures significant for klebsiella pneumoniae bacteremia.   Assessment & Plan:   * Severe sepsis ([place]) Secondary to klebsiella UTI and bacteremia. Patient empirically treated with vancomycin and cefepime and was transitioned to Ceftriaxone after culture results were available -Continue Ceftriaxone -Follow-up culture sensitivities  Obstructive uropathy Patient seen and evaluated by urology. Cystoscopy with left ureteroscopy laser lithotripsy/stone extraction/stent placement performed on 11/10  Hypomagnesemia Given magnesium repletion -Recheck magnesium  AKI (acute kidney injury) ([place]) Secondary to sepsis and obstructive uropathy. Creatinine improving.  Acute respiratory failure with hypoxia ([facility]) Chest x-ray with atelectasis -Incentive spirometry  Elevated transaminase level Likely secondary to sepsis/infection. Improved.  Thrombocytopenia ([place]) Acute. Likely secondary to sepsis/infection.  -Discontinue heparin subq -CBC in AM  UTI due to Klebsiella species Secondary to kidney stone with resultant obstruction. Urine culture significant for klebsiella pneumoniae. Antibiotics as mentioned above  Bacteremia due to Klebsiella pneumoniae Secondary to UTI/Pyelonephritis. Antibiotics as mentioned above.     DVT prophylaxis: SCDs Code Status:   Code Status: Full Code Family Communication: Wife at bedside Disposition Plan: Discharge  possibly home in 2-3 days pending transition to oral antibiotics, improvement of AKI, urology recommendations, PT/OT recommendations   Consultants:  PCCM Urology  Procedures:  CYSTOSCOPY WITH LEFT RETROGRADE PYELOGRAM, LEFT URETEROSCOPY LASER LITHOTRIPSY, STONE BASKET EXTRACTION, STENT PLACEMENT (05/02/2021)  Antimicrobials: Vancomycin IV Cefepime IV Ceftriaxone IV    Subjective: Patient is feeling better. No issues currently. Has not gotten out of bed yet.  Objective: Vitals:   05/04/21 0900 05/04/21 1000 05/04/21 1100 05/04/21 1217  BP: 107/60 114/66 114/68   Pulse: 94 89 80   Resp: 18 15 17    Temp:    98.3 F (36.8 C)  TempSrc:    Oral  SpO2: 96% 96% 94%   Weight:      Height:        Intake/Output Summary (Last 24 hours) at 05/04/2021 1411 Last data filed at 05/04/2021 1200 Gross per 24 hour  Intake 425.11 ml  Output 1880 ml  Net -1454.89 ml   Filed Weights   05/02/21 1155 05/04/21 0427  Weight: 69.4 kg 68.4 kg    Examination:  General exam: Appears calm and comfortable Respiratory system: Clear to auscultation. Respiratory effort normal. Cardiovascular system: S1 & S2 heard, RRR. No murmurs, rubs, gallops or clicks. Gastrointestinal system: Abdomen is nondistended, soft and nontender. No organomegaly or masses felt. Normal bowel sounds heard. Central nervous system: Alert and oriented. No focal neurological deficits. Musculoskeletal: No edema. No calf tenderness Skin: No cyanosis. No rashes Psychiatry: Judgement and insight appear normal. Mood & affect appropriate.     Data Reviewed: I have personally reviewed following labs and imaging studies  CBC Lab Results  Component Value Date   WBC 15.3 (H) 05/04/2021   RBC 3.63 (L) 05/04/2021   HGB 10.9 (L) 05/04/2021   HCT 32.1 (L) 05/04/2021   MCV 88.4 05/04/2021   MCH 30.0 05/04/2021   PLT 67 (L) 05/04/2021   MCHC 34.0 05/04/2021   RDW 13.3 05/04/2021  LYMPHSABS 0.6 (L) 05/02/2021   MONOABS 0.8  05/02/2021   EOSABS 0.0 05/02/2021   BASOSABS 0.1 06/25/7251     Last metabolic panel Lab Results  Component Value Date   NA 138 05/04/2021   K 3.7 05/04/2021   CL 112 (H) 05/04/2021   CO2 17 (L) 05/04/2021   BUN 43 (H) 05/04/2021   CREATININE 1.98 (H) 05/04/2021   GLUCOSE 93 05/04/2021   GFRNONAA 27 (L) 05/04/2021   GFRAA 79 10/21/2019   CALCIUM 7.8 (L) 05/04/2021   PHOS 4.5 05/03/2021   PROT 5.4 (L) 05/04/2021   ALBUMIN 2.6 (L) 05/04/2021   LABGLOB 2.5 08/23/2020   AGRATIO 1.9 08/23/2020   BILITOT 0.7 05/04/2021   ALKPHOS 191 (H) 05/04/2021   AST 89 (H) 05/04/2021   ALT 114 (H) 05/04/2021   ANIONGAP 9 05/04/2021    CBG (last 3)  Recent Labs    05/04/21 0305 05/04/21 0748 05/04/21 1150  GLUCAP 94 96 94     GFR: Estimated Creatinine Clearance: 23.8 mL/min (A) (by C-G formula based on SCr of 1.98 mg/dL (H)).  Coagulation Profile: Recent Labs  Lab 05/02/21 1228  INR 1.4*    Recent Results (from the past 240 hour(s))  Blood Culture (routine x 2)     Status: Abnormal (Preliminary result)   Collection Time: 05/02/21 12:00 PM   Specimen: BLOOD  Result Value Ref Range Status   Specimen Description   Final    BLOOD LEFT ANTECUBITAL Performed at [facility] [address] [place], [place] [address]    Special Requests   Final    BOTTLES DRAWN AEROBIC AND ANAEROBIC Blood Culture results may not be optimal due to an inadequate volume of blood received in culture bottles Performed at [facility] [address] [place], [place] [address]    Culture  Setup Time   Final    GRAM NEGATIVE RODS IN BOTH AEROBIC AND ANAEROBIC BOTTLES CRITICAL RESULT CALLED TO, READ BACK BY AND VERIFIED WITH: Sanda Klein, AT [phone] 05/03/21 D. Victoriano Lain    Culture (A)  Final    KLEBSIELLA PNEUMONIAE SUSCEPTIBILITIES TO FOLLOW Performed at [facility], [place] [address], [place], [place] [address]    Report Status PENDING  Incomplete   Blood Culture ID Panel (Reflexed)     Status: Abnormal   Collection Time: 05/02/21 12:00 PM  Result Value Ref Range Status   Enterococcus faecalis NOT DETECTED NOT DETECTED Final   Enterococcus Faecium NOT DETECTED NOT DETECTED Final   Listeria monocytogenes NOT DETECTED NOT DETECTED Final   Staphylococcus species NOT DETECTED NOT DETECTED Final   Staphylococcus aureus (BCID) NOT DETECTED NOT DETECTED Final   Staphylococcus epidermidis NOT DETECTED NOT DETECTED Final   Staphylococcus lugdunensis NOT DETECTED NOT DETECTED Final   Streptococcus species NOT DETECTED NOT DETECTED Final   Streptococcus agalactiae NOT DETECTED NOT DETECTED Final   Streptococcus pneumoniae NOT DETECTED NOT DETECTED Final   Streptococcus pyogenes NOT DETECTED NOT DETECTED Final   A.calcoaceticus-baumannii NOT DETECTED NOT DETECTED Final   Bacteroides fragilis NOT DETECTED NOT DETECTED Final   Enterobacterales DETECTED (A) NOT DETECTED Final    Comment: Enterobacterales represent a large order of gram negative bacteria, not a single organism. CRITICAL RESULT CALLED TO, READ BACK BY AND VERIFIED WITH: Colin Rhein PHARMD, AT 4332 05/03/21 D. VANHOOK    Enterobacter cloacae complex NOT DETECTED NOT DETECTED Final   Escherichia coli NOT DETECTED NOT DETECTED Final   Klebsiella aerogenes  NOT DETECTED NOT DETECTED Final   Klebsiella oxytoca NOT DETECTED NOT DETECTED Final   Klebsiella pneumoniae DETECTED (A) NOT DETECTED Final    Comment: CRITICAL RESULT CALLED TO, READ BACK BY AND VERIFIED WITH: Colin Rhein PHARMD, AT 0240 05/03/21 D. VANHOOK    Proteus species NOT DETECTED NOT DETECTED Final   Salmonella species NOT DETECTED NOT DETECTED Final   Serratia marcescens NOT DETECTED NOT DETECTED Final   Haemophilus influenzae NOT DETECTED NOT DETECTED Final   Neisseria meningitidis NOT DETECTED NOT DETECTED Final   Pseudomonas aeruginosa NOT DETECTED NOT DETECTED Final   Stenotrophomonas maltophilia NOT DETECTED NOT  DETECTED Final   Candida albicans NOT DETECTED NOT DETECTED Final   Candida auris NOT DETECTED NOT DETECTED Final   Candida glabrata NOT DETECTED NOT DETECTED Final   Candida krusei NOT DETECTED NOT DETECTED Final   Candida parapsilosis NOT DETECTED NOT DETECTED Final   Candida tropicalis NOT DETECTED NOT DETECTED Final   Cryptococcus neoformans/gattii NOT DETECTED NOT DETECTED Final   CTX-M ESBL NOT DETECTED NOT DETECTED Final   Carbapenem resistance IMP NOT DETECTED NOT DETECTED Final   Carbapenem resistance KPC NOT DETECTED NOT DETECTED Final   Carbapenem resistance NDM NOT DETECTED NOT DETECTED Final   Carbapenem resist OXA 48 LIKE NOT DETECTED NOT DETECTED Final   Carbapenem resistance VIM NOT DETECTED NOT DETECTED Final    Comment: Performed at [facility], [address], [place], [place] [address]  Blood Culture (routine x 2)     Status: None (Preliminary result)   Collection Time: 05/02/21 12:15 PM   Specimen: BLOOD  Result Value Ref Range Status   Specimen Description   Final    BLOOD LEFT ANTECUBITAL Performed at [facility] [address] [place], [place] [address]    Special Requests   Final    BOTTLES DRAWN AEROBIC AND ANAEROBIC Blood Culture results may not be optimal due to an inadequate volume of blood received in culture bottles Performed at [facility] [address] [place], [place] [address]    Culture  Setup Time   Final    GRAM NEGATIVE RODS AEROBIC BOTTLE ONLY CRITICAL VALUE NOTED.  VALUE IS CONSISTENT WITH PREVIOUSLY REPORTED AND CALLED VALUE.    Culture   Final    GRAM NEGATIVE RODS CULTURE REINCUBATED FOR BETTER GROWTH Performed at [facility], [place] [address], [place], [place] [address]    Report Status PENDING  Incomplete  Resp Panel by RT-PCR (Flu A&B, Covid) Nasopharyngeal Swab     Status: None   Collection Time: 05/02/21 12:16 PM   Specimen: Nasopharyngeal Swab; Nasopharyngeal(NP) swabs in  vial transport medium  Result Value Ref Range Status   SARS Coronavirus 2 by RT PCR NEGATIVE NEGATIVE Final    Comment: (NOTE) SARS-CoV-2 target nucleic acids are NOT DETECTED.  The SARS-CoV-2 RNA is generally detectable in upper respiratory specimens during the acute phase of infection. The lowest concentration of SARS-CoV-2 viral copies this assay can detect is 138 copies/mL. A negative result does not preclude SARS-Cov-2 infection and should not be used as the sole basis for treatment or other patient management decisions. A negative result may occur with  improper specimen collection/handling, submission of specimen other than nasopharyngeal swab, presence of viral mutation(s) within the areas targeted by this assay, and inadequate number of viral copies(<138 copies/mL). A negative result must be combined with clinical observations, patient history, and epidemiological information. The expected result is Negative.  Fact Sheet for Patients:  EntrepreneurPulse.com.au  Fact Sheet for Healthcare Providers:  IncredibleEmployment.be  This test is no t yet approved or cleared by the [place] FDA and  has been authorized for detection and/or diagnosis of SARS-CoV-2 by FDA under an Emergency Use Authorization (EUA). This EUA will remain  in effect (meaning this test can be used) for the duration of the COVID-19 declaration under Section 564(b)(1) of the Act, 21 U.S.C.section 360bbb-3(b)(1), unless the authorization is terminated  or revoked sooner.       Influenza A by PCR NEGATIVE NEGATIVE Final   Influenza B by PCR NEGATIVE NEGATIVE Final    Comment: (NOTE) The Xpert Xpress SARS-CoV-2/FLU/RSV plus assay is intended as an aid in the diagnosis of influenza from Nasopharyngeal swab specimens and should not be used as a sole basis for treatment. Nasal washings and aspirates are unacceptable for Xpert Xpress SARS-CoV-2/FLU/RSV testing.  Fact  Sheet for Patients: EntrepreneurPulse.com.au  Fact Sheet for Healthcare Providers: IncredibleEmployment.be  This test is not yet approved or cleared by the [place] FDA and has been authorized for detection and/or diagnosis of SARS-CoV-2 by FDA under an Emergency Use Authorization (EUA). This EUA will remain in effect (meaning this test can be used) for the duration of the COVID-19 declaration under Section 564(b)(1) of the Act, 21 U.S.C. section 360bbb-3(b)(1), unless the authorization is terminated or revoked.  Performed at [facility], [place] [address], [place], [place] [address]   Urine Culture     Status: Abnormal   Collection Time: 05/02/21 12:16 PM   Specimen: In/Out Cath Urine  Result Value Ref Range Status   Specimen Description   Final    IN/OUT CATH URINE Performed at [facility] [address] [place], [place] [address]    Special Requests   Final    NONE Performed at [facility], [place] [address] [place], [place] [address]    Culture >=100,000 COLONIES/mL KLEBSIELLA PNEUMONIAE (A)  Final   Report Status 05/04/2021 FINAL  Final   Organism ID, Bacteria KLEBSIELLA PNEUMONIAE (A)  Final      Susceptibility   Klebsiella pneumoniae - MIC*    AMPICILLIN RESISTANT Resistant     CEFAZOLIN <=4 SENSITIVE Sensitive     CEFEPIME <=0.12 SENSITIVE Sensitive     CEFTRIAXONE <=0.25 SENSITIVE Sensitive     CIPROFLOXACIN <=0.25 SENSITIVE Sensitive     GENTAMICIN <=1 SENSITIVE Sensitive     IMIPENEM 0.5 SENSITIVE Sensitive     NITROFURANTOIN 64 INTERMEDIATE Intermediate     TRIMETH/SULFA <=20 SENSITIVE Sensitive     AMPICILLIN/SULBACTAM 4 SENSITIVE Sensitive     PIP/TAZO <=4 SENSITIVE Sensitive     * >=100,000 COLONIES/mL KLEBSIELLA PNEUMONIAE  MRSA Next Gen by PCR, Nasal     Status: None   Collection Time: 05/02/21  6:29 PM   Specimen: Nasal Mucosa; Nasal Swab  Result Value  Ref Range Status   MRSA by PCR Next Gen NOT DETECTED NOT DETECTED Final    Comment: (NOTE) The GeneXpert MRSA Assay (FDA approved for NASAL specimens only), is one component of a comprehensive MRSA colonization surveillance program. It is not intended to diagnose MRSA infection nor to guide or monitor treatment for MRSA infections. Test performance is not FDA approved in patients less than 7 years old. Performed at [facility], [place] [address] [place], [place] [address]         Radiology Studies: DG CHEST PORT 1 VIEW  Result Date:  05/03/2021 CLINICAL DATA:  Dyspnea 1 EXAM: PORTABLE CHEST 1 VIEW COMPARISON:  Prior chest x-ray 05/02/2021 FINDINGS: Stable cardiac and mediastinal contours. Improved inspiration. Linear opacity in the retrocardiac region likely reflects residual atelectasis. No pleural effusion, pneumothorax or focal airspace infiltrate. IMPRESSION: Improved aeration with larger lung volumes. Persistent linear opacity in the left lung base favored to reflect atelectasis. Electronically Signed   By: Jacqulynn Cadet M.D.   On: 05/03/2021 09:17   DG C-Arm 1-60 Min-No Report  Result Date: 05/02/2021 Fluoroscopy was utilized by the requesting physician.  No radiographic interpretation.   [place] EKG SITE RITE  Result Date: 05/02/2021 If Site Rite image not attached, placement could not be confirmed due to current cardiac rhythm.       Scheduled Meds:  atorvastatin  20 mg Oral Daily   Chlorhexidine Gluconate Cloth  6 each Topical Daily   insulin aspart  1-3 Units Subcutaneous Q4H   mouth rinse  15 mL Mouth Rinse BID   pantoprazole  40 mg Oral Daily   tamsulosin  0.4 mg Oral QHS   Continuous Infusions:  sodium chloride Stopped (05/04/21 1140)   cefTRIAXone (ROCEPHIN)  IV 200 mL/hr at 05/04/21 1200   lactated ringers     promethazine (PHENERGAN) injection (IM or IVPB) Stopped (05/03/21 1207)     LOS: 2 days     Cordelia Poche, MD [facility] 05/04/2021, 2:11 PM  If 7PM-7AM, please contact night-coverage [email or web address]

## 2021-05-04 NOTE — Assessment & Plan Note (Signed)
Secondary to kidney stone with resultant obstruction. Urine culture significant for klebsiella pneumoniae. Antibiotics as mentioned above

## 2021-05-04 NOTE — Assessment & Plan Note (Addendum)
Baseline creatinine of about 0.9. Acute injury secondary to sepsis and obstructive uropathy. Creatinine improving.

## 2021-05-04 NOTE — Assessment & Plan Note (Signed)
Likely secondary to sepsis/infection. Improved.

## 2021-05-04 NOTE — Assessment & Plan Note (Addendum)
Secondary to klebsiella UTI and bacteremia. Patient empirically treated with vancomycin and cefepime and was transitioned to Ceftriaxone. Ceftriaxone transitioned to Cefadroxil.

## 2021-05-04 NOTE — Hospital Course (Signed)
Carol [name] is a 70 y.o. female with a history of nephrolithiasis and mild intermittent asthma. Patient presented secondary to LLQ/flank pain, nausea/vomiting/diarrhea. She was diagnosed with Sepsis with associated end organ damage. Empiric vancomycin and Cefepime initiated. Urology consulted for left obstructive uropathy and patient underwent cystoscopy with stone removal and stent. Blood and urine cultures significant for klebsiella pneumoniae bacteremia.

## 2021-05-04 NOTE — Assessment & Plan Note (Addendum)
Initialchest x-ray with atelectasis. Patient with associated dyspnea and cough. Afebrile. Some improvement of symptoms with Albuterol nebulizer. Repeat chest x-ray with concern for possible left basilar pneumonia. Incentive spirometry and flutter valve prescribed. Given a prescription for albuterol nebulizer and patient to discharge on Cefadroxil and Azithromycin.

## 2021-05-05 ENCOUNTER — Inpatient Hospital Stay (HOSPITAL_COMMUNITY): Payer: PPO

## 2021-05-05 LAB — CULTURE, BLOOD (ROUTINE X 2)

## 2021-05-05 LAB — BASIC METABOLIC PANEL
Anion gap: 6 (ref 5–15)
BUN: 30 mg/dL — ABNORMAL HIGH (ref 8–23)
CO2: 20 mmol/L — ABNORMAL LOW (ref 22–32)
Calcium: 8.1 mg/dL — ABNORMAL LOW (ref 8.9–10.3)
Chloride: 112 mmol/L — ABNORMAL HIGH (ref 98–111)
Creatinine, Ser: 1.31 mg/dL — ABNORMAL HIGH (ref 0.44–1.00)
GFR, Estimated: 45 mL/min — ABNORMAL LOW (ref >60)
Glucose, Bld: 106 mg/dL — ABNORMAL HIGH (ref 70–99)
Potassium: 3.6 mmol/L (ref 3.5–5.1)
Sodium: 138 mmol/L (ref 135–145)

## 2021-05-05 LAB — CBC
HCT: 32.6 % — ABNORMAL LOW (ref 36.0–46.0)
Hemoglobin: 10.9 g/dL — ABNORMAL LOW (ref 12.0–15.0)
MCH: 29.8 pg (ref 26.0–34.0)
MCHC: 33.4 g/dL (ref 30.0–36.0)
MCV: 89.1 fL (ref 80.0–100.0)
Platelets: 71 10*3/uL — ABNORMAL LOW (ref 150–400)
RBC: 3.66 MIL/uL — ABNORMAL LOW (ref 3.87–5.11)
RDW: 13.7 % (ref 11.5–15.5)
WBC: 12.3 10*3/uL — ABNORMAL HIGH (ref 4.0–10.5)
nRBC: 0 % (ref 0.0–0.2)

## 2021-05-05 LAB — MAGNESIUM: Magnesium: 2.2 mg/dL (ref 1.7–2.4)

## 2021-05-05 LAB — GLUCOSE, CAPILLARY
Glucose-Capillary: 100 mg/dL — ABNORMAL HIGH (ref 70–99)
Glucose-Capillary: 107 mg/dL — ABNORMAL HIGH (ref 70–99)
Glucose-Capillary: 116 mg/dL — ABNORMAL HIGH (ref 70–99)
Glucose-Capillary: 127 mg/dL — ABNORMAL HIGH (ref 70–99)
Glucose-Capillary: 172 mg/dL — ABNORMAL HIGH (ref 70–99)
Glucose-Capillary: 185 mg/dL — ABNORMAL HIGH (ref 70–99)
Glucose-Capillary: 83 mg/dL (ref 70–99)

## 2021-05-05 MED ORDER — GUAIFENESIN ER 600 MG PO TB12
600.0000 mg | ORAL_TABLET | Freq: Two times a day (BID) | ORAL | Status: DC
  Administered 2021-05-05 – 2021-05-07 (×5): 600 mg via ORAL
  Filled 2021-05-05 (×5): qty 1

## 2021-05-05 MED ORDER — ZONISAMIDE 100 MG PO CAPS
100.0000 mg | ORAL_CAPSULE | Freq: Two times a day (BID) | ORAL | Status: DC
  Administered 2021-05-05 – 2021-05-07 (×4): 100 mg via ORAL
  Filled 2021-05-05 (×5): qty 1

## 2021-05-05 MED ORDER — DEXAMETHASONE SODIUM PHOSPHATE 10 MG/ML IJ SOLN
10.0000 mg | Freq: Once | INTRAMUSCULAR | Status: AC
  Administered 2021-05-05: 10 mg via INTRAVENOUS
  Filled 2021-05-05: qty 1

## 2021-05-05 MED ORDER — KETOROLAC TROMETHAMINE 15 MG/ML IJ SOLN
15.0000 mg | Freq: Once | INTRAMUSCULAR | Status: AC
  Administered 2021-05-05: 15 mg via INTRAVENOUS
  Filled 2021-05-05: qty 1

## 2021-05-05 MED ORDER — SUMATRIPTAN SUCCINATE 50 MG PO TABS
50.0000 mg | ORAL_TABLET | ORAL | Status: AC | PRN
  Administered 2021-05-05: 50 mg via ORAL
  Filled 2021-05-05 (×3): qty 1

## 2021-05-05 MED ORDER — SALINE SPRAY 0.65 % NA SOLN
1.0000 | NASAL | Status: DC | PRN
  Filled 2021-05-05: qty 44

## 2021-05-05 MED ORDER — METOCLOPRAMIDE HCL 5 MG/ML IJ SOLN
10.0000 mg | Freq: Once | INTRAMUSCULAR | Status: AC
  Administered 2021-05-05: 10 mg via INTRAVENOUS
  Filled 2021-05-05: qty 2

## 2021-05-05 MED ORDER — BENZONATATE 100 MG PO CAPS
200.0000 mg | ORAL_CAPSULE | Freq: Three times a day (TID) | ORAL | Status: DC | PRN
  Administered 2021-05-05 – 2021-05-06 (×4): 200 mg via ORAL
  Filled 2021-05-05 (×4): qty 2

## 2021-05-05 NOTE — Assessment & Plan Note (Addendum)
Acute episode. Patient is on zonisamide as an outpatient which was held secondary to AKI. Patient with current episode starting 11/12. Given Imitrex which helped shortly. GFR now significantly improved. Migraine aborted after IV Reglan/Toradol/Decadron combination Continue home zonisamide.

## 2021-05-05 NOTE — Progress Notes (Signed)
Physical Therapy Treatment Patient Details Name: Carol [name] MRN: [id number] DOB: December 27, 1950 Today's Date: 05/05/2021   History of Present Illness Pt is a 70 year old woman admitted on 05/02/21 with weakness, lightheadedness, fall, N/V/D. +urosepis and kidney stone, hypotensive. Underwent L urteroscopy, lithrotripsy and stent placement on 05/02/21. PMH: nephrolitiasis, mild asthma, headaches, OA and anxiety.    PT Comments    Pt continues very cooperative but reports increased fatigue this date and moving slower and requiring increased standing rest breaks with ambulation to complete task.   Recommendations for follow up therapy are one component of a multi-disciplinary discharge planning process, led by the attending physician.  Recommendations may be updated based on patient status, additional functional criteria and insurance authorization.  Follow Up Recommendations  No PT follow up     Assistance Recommended at Discharge Frequent or constant Supervision/Assistance  Equipment Recommendations  None recommended by PT    Recommendations for Other Services       Precautions / Restrictions Precautions Precautions: Fall Precaution Comments: watch BP Restrictions Weight Bearing Restrictions: No     Mobility  Bed Mobility Overal bed mobility: Needs Assistance Bed Mobility: Rolling;Sidelying to Sit Rolling: Supervision Sidelying to sit: Min assist       General bed mobility comments: min assist to raise trunk, HOB up    Transfers Overall transfer level: Needs assistance Equipment used: Rolling walker (2 wheels) Transfers: Sit to/from Stand Sit to Stand: Min assist           General transfer comment: cues for hand placement, min assist to rise and steady    Ambulation/Gait Ambulation/Gait assistance: Min assist;Min guard Gait Distance (Feet): 222 Feet Assistive device: Rolling walker (2 wheels) Gait Pattern/deviations: Step-through pattern;Decreased  step length - right;Decreased step length - left;Shuffle;Trunk flexed Gait velocity: decreased pace with several standing rest breaks     General Gait Details: cues for posture and position from [employer]             Wheelchair Mobility    Modified Rankin (Stroke Patients Only)       Balance Overall balance assessment: Needs assistance Sitting-balance support: No upper extremity supported;Feet supported Sitting balance-Leahy Scale: Good     Standing balance support: Bilateral upper extremity supported Standing balance-Leahy Scale: Poor Standing balance comment: reliant on B UE support of walker                            Cognition Arousal/Alertness: Awake/alert Behavior During Therapy: WFL for tasks assessed/performed Overall Cognitive Status: Within Functional Limits for tasks assessed                                          Exercises      General Comments        Pertinent Vitals/Pain Pain Assessment: 0-10 Pain Score: 4  Pain Location: abdomen with coughing Pain Descriptors / Indicators: Sore;Discomfort;Guarding Pain Intervention(s): Limited activity within patient's tolerance;Monitored during session;Premedicated before session    Home Living                          Prior Function            PT Goals (current goals can now be found in the care plan section) Acute Rehab PT Goals Patient Stated Goal: Regain  IND PT Goal Formulation: With patient Time For Goal Achievement: 05/18/21 Potential to Achieve Goals: Good Progress towards PT goals: Progressing toward goals    Frequency    Min 3X/week      PT Plan Current plan remains appropriate    Co-evaluation              AM-PAC PT "6 Clicks" Mobility   Outcome Measure  Help needed turning from your back to your side while in a flat bed without using bedrails?: A Little Help needed moving from lying on your back to sitting on the side of a  flat bed without using bedrails?: A Little Help needed moving to and from a bed to a chair (including a wheelchair)?: A Little Help needed standing up from a chair using your arms (e.g., wheelchair or bedside chair)?: A Little Help needed to walk in hospital room?: A Little Help needed climbing 3-5 steps with a railing? : A Lot 6 Click Score: 17    End of Session   Activity Tolerance: Patient tolerated treatment well Patient left: in chair;with call bell/phone within reach;with chair alarm set;with family/visitor present Nurse Communication: Mobility status PT Visit Diagnosis: Unsteadiness on feet (R26.81);Difficulty in walking, not elsewhere classified (R26.2)     Time: 4098-1191 PT Time Calculation (min) (ACUTE ONLY): 26 min  Charges:  $Gait Training: 23-37 mins                     Mauro Kaufmann PT Acute Rehabilitation Services Pager [phone] Office [phone]    Carol [name] 05/05/2021, 3:23 PM

## 2021-05-05 NOTE — Progress Notes (Signed)
PROGRESS NOTE    Carol [name]  EZM:[id number] DOB: [birth year] DOA: 05/02/2021 PCP: Denita Lung, MD   Brief Narrative: Carol [name] is a 70 y.o. female with a history of nephrolithiasis and mild intermittent asthma. Patient presented secondary to LLQ/flank pain, nausea/vomiting/diarrhea. She was diagnosed with Sepsis with associated end organ damage. Empiric vancomycin and Cefepime initiated. Urology consulted for left obstructive uropathy and patient underwent cystoscopy with stone removal and stent. Blood and urine cultures significant for klebsiella pneumoniae bacteremia.   Assessment & Plan:   * Severe sepsis ([place]) Secondary to klebsiella UTI and bacteremia. Patient empirically treated with vancomycin and cefepime and was transitioned to Ceftriaxone -Continue Ceftriaxone  Obstructive uropathy Patient seen and evaluated by urology. Cystoscopy with left ureteroscopy laser lithotripsy/stone extraction/stent placement performed on 11/10. Urology recommendation to remove foley in 3-5 days or prior to discharge, whichever is longer.  AKI (acute kidney injury) ([place]) Baseline creatinine of about 0.9. Acute injury secondary to sepsis and obstructive uropathy. Creatinine improving.  Acute respiratory failure with hypoxia ([place]) Prior chest x-ray with atelectasis. Patient with associated dyspnea overnight. Cough. Feeling of congestion but no definite sputum production at this time. Received Albuterol nebulizer overnight with improvement of symptoms -Incentive spirometry, flutter valve -Continue albuterol -Repeat chest x-ray -Wean to room air  Elevated transaminase level Likely secondary to sepsis/infection. Improved.  Thrombocytopenia ([place]) Acute. Likely secondary to sepsis/infection. Subcutaneous heparin discontinued. Stabilized. -CBC in AM  UTI due to Klebsiella species Secondary to kidney stone with resultant obstruction. Urine culture significant for  klebsiella pneumoniae. Antibiotics as mentioned above  Bacteremia due to Klebsiella pneumoniae Secondary to UTI/Pyelonephritis. Antibiotics as mentioned above.  Migraine headache with aura Acute episode. Patient is on zonisamide as an outpatient which was held secondary to AKI. Patient with current episode starting 11/12. Given Imitrex which helped shortly. GFR now significantly improved.  -Imitrex prn -Restart home zonisamide -If continued symptoms, will treat with IV "migraine cocktail"  Hypomagnesemia-resolved as of 05/05/2021 Given magnesium repletion. Resolved.    DVT prophylaxis: SCDs Code Status:   Code Status: Full Code Family Communication: Wife at bedside Disposition Plan: Discharge possibly home in 1-2 days pending transition to oral antibiotics, improvement of AKI, urology recommendations, ability to wean to room air.   Consultants:  PCCM Urology  Procedures:  CYSTOSCOPY WITH LEFT RETROGRADE PYELOGRAM, LEFT URETEROSCOPY LASER LITHOTRIPSY, STONE BASKET EXTRACTION, STENT PLACEMENT (05/02/2021)  Antimicrobials: Vancomycin IV Cefepime IV Ceftriaxone IV    Subjective: Bad night last night with migraine/aura, coughing with resultant chest pain, dyspnea. Received albuterol with some improvement in her breathing. Imitrex helped last night for her migraine, which has returned.  Objective: Vitals:   05/04/21 1722 05/04/21 2018 05/05/21 0500 05/05/21 0556  BP: 125/65 102/66  127/78  Pulse: 98 100  85  Resp: 18 16  16   Temp: 98.8 F (37.1 C) 97.7 F (36.5 C)  98.3 F (36.8 C)  TempSrc:    Oral  SpO2: 97% 95%  98%  Weight:   82.2 kg   Height:        Intake/Output Summary (Last 24 hours) at 05/05/2021 1115 Last data filed at 05/05/2021 0322 Gross per 24 hour  Intake 136.71 ml  Output 638 ml  Net -501.29 ml    Filed Weights   05/02/21 1155 05/04/21 0427 05/05/21 0500  Weight: 69.4 kg 68.4 kg 82.2 kg    Examination:  General exam: Appears calm and  slightly uncomfortable Respiratory system: Clear to auscultation. Respiratory effort  normal. Cardiovascular system: S1 & S2 heard, RRR. No murmurs, rubs, gallops or clicks. Gastrointestinal system: Abdomen is nondistended, soft and nontender. No organomegaly or masses felt. Normal bowel sounds heard. Central nervous system: Alert and oriented. No focal neurological deficits. Musculoskeletal: Mild left upper extremity edema No calf tenderness Skin: No cyanosis. No rashes Psychiatry: Judgement and insight appear normal. Mood & affect appropriate.     Data Reviewed: I have personally reviewed following labs and imaging studies  CBC Lab Results  Component Value Date   WBC 12.3 (H) 05/05/2021   RBC 3.66 (L) 05/05/2021   HGB 10.9 (L) 05/05/2021   HCT 32.6 (L) 05/05/2021   MCV 89.1 05/05/2021   MCH 29.8 05/05/2021   PLT 71 (L) 05/05/2021   MCHC 33.4 05/05/2021   RDW 13.7 05/05/2021   LYMPHSABS 0.6 (L) 05/02/2021   MONOABS 0.8 05/02/2021   EOSABS 0.0 05/02/2021   BASOSABS 0.1 69/79/4801     Last metabolic panel Lab Results  Component Value Date   NA 138 05/05/2021   K 3.6 05/05/2021   CL 112 (H) 05/05/2021   CO2 20 (L) 05/05/2021   BUN 30 (H) 05/05/2021   CREATININE 1.31 (H) 05/05/2021   GLUCOSE 106 (H) 05/05/2021   GFRNONAA 45 (L) 05/05/2021   GFRAA 79 10/21/2019   CALCIUM 8.1 (L) 05/05/2021   PHOS 4.5 05/03/2021   PROT 5.4 (L) 05/04/2021   ALBUMIN 2.6 (L) 05/04/2021   LABGLOB 2.5 08/23/2020   AGRATIO 1.9 08/23/2020   BILITOT 0.7 05/04/2021   ALKPHOS 191 (H) 05/04/2021   AST 89 (H) 05/04/2021   ALT 114 (H) 05/04/2021   ANIONGAP 6 05/05/2021    CBG (last 3)  Recent Labs    05/05/21 0024 05/05/21 0358 05/05/21 0744  GLUCAP 83 107* 116*      GFR: Estimated Creatinine Clearance: 39.6 mL/min (A) (by C-G formula based on SCr of 1.31 mg/dL (H)).  Coagulation Profile: Recent Labs  Lab 05/02/21 1228  INR 1.4*     Recent Results (from the past 240 hour(s))   Blood Culture (routine x 2)     Status: Abnormal   Collection Time: 05/02/21 12:00 PM   Specimen: BLOOD  Result Value Ref Range Status   Specimen Description   Final    BLOOD LEFT ANTECUBITAL Performed at [facility] [address] [place], [place] [address]    Special Requests   Final    BOTTLES DRAWN AEROBIC AND ANAEROBIC Blood Culture results may not be optimal due to an inadequate volume of blood received in culture bottles Performed at [facility] [address] [place], [place] [address]    Culture  Setup Time   Final    GRAM NEGATIVE RODS IN BOTH AEROBIC AND ANAEROBIC BOTTLES CRITICAL RESULT CALLED TO, READ BACK BY AND VERIFIED WITH: Sanda Klein, AT 7867 05/03/21 [place] Performed at [facility], [place] [address] [place], [place] [address]    Culture KLEBSIELLA PNEUMONIAE (A)  Final   Report Status 05/05/2021 FINAL  Final   Organism ID, Bacteria KLEBSIELLA PNEUMONIAE  Final      Susceptibility   Klebsiella pneumoniae - MIC*    AMPICILLIN RESISTANT Resistant     CEFAZOLIN <=4 SENSITIVE Sensitive     CEFEPIME <=0.12 SENSITIVE Sensitive     CEFTAZIDIME <=1 SENSITIVE Sensitive     CEFTRIAXONE <=0.25 SENSITIVE Sensitive     CIPROFLOXACIN <=0.25 SENSITIVE Sensitive     GENTAMICIN <=1 SENSITIVE Sensitive  IMIPENEM 0.5 SENSITIVE Sensitive     TRIMETH/SULFA <=20 SENSITIVE Sensitive     AMPICILLIN/SULBACTAM 8 SENSITIVE Sensitive     PIP/TAZO <=4 SENSITIVE Sensitive     * KLEBSIELLA PNEUMONIAE  Blood Culture ID Panel (Reflexed)     Status: Abnormal   Collection Time: 05/02/21 12:00 PM  Result Value Ref Range Status   Enterococcus faecalis NOT DETECTED NOT DETECTED Final   Enterococcus Faecium NOT DETECTED NOT DETECTED Final   Listeria monocytogenes NOT DETECTED NOT DETECTED Final   Staphylococcus species NOT DETECTED NOT DETECTED Final   Staphylococcus aureus (BCID) NOT DETECTED NOT DETECTED Final   Staphylococcus  epidermidis NOT DETECTED NOT DETECTED Final   Staphylococcus lugdunensis NOT DETECTED NOT DETECTED Final   Streptococcus species NOT DETECTED NOT DETECTED Final   Streptococcus agalactiae NOT DETECTED NOT DETECTED Final   Streptococcus pneumoniae NOT DETECTED NOT DETECTED Final   Streptococcus pyogenes NOT DETECTED NOT DETECTED Final   A.calcoaceticus-baumannii NOT DETECTED NOT DETECTED Final   Bacteroides fragilis NOT DETECTED NOT DETECTED Final   Enterobacterales DETECTED (A) NOT DETECTED Final    Comment: Enterobacterales represent a large order of gram negative bacteria, not a single organism. CRITICAL RESULT CALLED TO, READ BACK BY AND VERIFIED WITH: Colin Rhein PHARMD, AT [phone] 05/03/21 D. [place]    Enterobacter cloacae complex NOT DETECTED NOT DETECTED Final   Escherichia coli NOT DETECTED NOT DETECTED Final   Klebsiella aerogenes NOT DETECTED NOT DETECTED Final   Klebsiella oxytoca NOT DETECTED NOT DETECTED Final   Klebsiella pneumoniae DETECTED (A) NOT DETECTED Final    Comment: CRITICAL RESULT CALLED TO, READ BACK BY AND VERIFIED WITH: Colin Rhein PHARMD, AT 5427 05/03/21 D. [place]    Proteus species NOT DETECTED NOT DETECTED Final   Salmonella species NOT DETECTED NOT DETECTED Final   Serratia marcescens NOT DETECTED NOT DETECTED Final   Haemophilus influenzae NOT DETECTED NOT DETECTED Final   Neisseria meningitidis NOT DETECTED NOT DETECTED Final   Pseudomonas aeruginosa NOT DETECTED NOT DETECTED Final   Stenotrophomonas maltophilia NOT DETECTED NOT DETECTED Final   Candida albicans NOT DETECTED NOT DETECTED Final   Candida auris NOT DETECTED NOT DETECTED Final   Candida glabrata NOT DETECTED NOT DETECTED Final   Candida krusei NOT DETECTED NOT DETECTED Final   Candida parapsilosis NOT DETECTED NOT DETECTED Final   Candida tropicalis NOT DETECTED NOT DETECTED Final   Cryptococcus neoformans/gattii NOT DETECTED NOT DETECTED Final   CTX-M ESBL NOT DETECTED NOT DETECTED Final    Carbapenem resistance IMP NOT DETECTED NOT DETECTED Final   Carbapenem resistance KPC NOT DETECTED NOT DETECTED Final   Carbapenem resistance NDM NOT DETECTED NOT DETECTED Final   Carbapenem resist OXA 48 LIKE NOT DETECTED NOT DETECTED Final   Carbapenem resistance VIM NOT DETECTED NOT DETECTED Final    Comment: Performed at [facility], [address], [place], [place] [address]  Blood Culture (routine x 2)     Status: Abnormal   Collection Time: 05/02/21 12:15 PM   Specimen: BLOOD  Result Value Ref Range Status   Specimen Description   Final    BLOOD LEFT ANTECUBITAL Performed at [facility] [name], [place], [place] [address]    Special Requests   Final    BOTTLES DRAWN AEROBIC AND ANAEROBIC Blood Culture results may not be optimal due to an inadequate volume of blood received in culture bottles Performed at [facility] [address] [place], [place] [address]  Culture  Setup Time   Final    GRAM NEGATIVE RODS IN BOTH AEROBIC AND ANAEROBIC BOTTLES CRITICAL VALUE NOTED.  VALUE IS CONSISTENT WITH PREVIOUSLY REPORTED AND CALLED VALUE.    Culture (A)  Final    KLEBSIELLA PNEUMONIAE SUSCEPTIBILITIES PERFORMED ON PREVIOUS CULTURE WITHIN THE LAST 5 DAYS. Performed at [facility], [place] [address] [place], [place] [address]    Report Status 05/05/2021 FINAL  Final  Resp Panel by RT-PCR (Flu A&B, Covid) Nasopharyngeal Swab     Status: None   Collection Time: 05/02/21 12:16 PM   Specimen: Nasopharyngeal Swab; Nasopharyngeal(NP) swabs in vial transport medium  Result Value Ref Range Status   SARS Coronavirus 2 by RT PCR NEGATIVE NEGATIVE Final    Comment: (NOTE) SARS-CoV-2 target nucleic acids are NOT DETECTED.  The SARS-CoV-2 RNA is generally detectable in upper respiratory specimens during the acute phase of infection. The lowest concentration of SARS-CoV-2 viral copies this assay can detect is 138 copies/mL. A  negative result does not preclude SARS-Cov-2 infection and should not be used as the sole basis for treatment or other patient management decisions. A negative result may occur with  improper specimen collection/handling, submission of specimen other than nasopharyngeal swab, presence of viral mutation(s) within the areas targeted by this assay, and inadequate number of viral copies(<138 copies/mL). A negative result must be combined with clinical observations, patient history, and epidemiological information. The expected result is Negative.  Fact Sheet for Patients:  EntrepreneurPulse.com.au  Fact Sheet for Healthcare Providers:  IncredibleEmployment.be  This test is no t yet approved or cleared by the [place] FDA and  has been authorized for detection and/or diagnosis of SARS-CoV-2 by FDA under an Emergency Use Authorization (EUA). This EUA will remain  in effect (meaning this test can be used) for the duration of the COVID-19 declaration under Section 564(b)(1) of the Act, 21 U.S.C.section 360bbb-3(b)(1), unless the authorization is terminated  or revoked sooner.       Influenza A by PCR NEGATIVE NEGATIVE Final   Influenza B by PCR NEGATIVE NEGATIVE Final    Comment: (NOTE) The Xpert Xpress SARS-CoV-2/FLU/RSV plus assay is intended as an aid in the diagnosis of influenza from Nasopharyngeal swab specimens and should not be used as a sole basis for treatment. Nasal washings and aspirates are unacceptable for Xpert Xpress SARS-CoV-2/FLU/RSV testing.  Fact Sheet for Patients: EntrepreneurPulse.com.au  Fact Sheet for Healthcare Providers: IncredibleEmployment.be  This test is not yet approved or cleared by the [place] FDA and has been authorized for detection and/or diagnosis of SARS-CoV-2 by FDA under an Emergency Use Authorization (EUA). This EUA will remain in effect (meaning this test can  be used) for the duration of the COVID-19 declaration under Section 564(b)(1) of the Act, 21 U.S.C. section 360bbb-3(b)(1), unless the authorization is terminated or revoked.  Performed at [facility], [place] [address], [place], [place] [address]   Urine Culture     Status: Abnormal   Collection Time: 05/02/21 12:16 PM   Specimen: In/Out Cath Urine  Result Value Ref Range Status   Specimen Description   Final    IN/OUT CATH URINE Performed at [facility] [address], [place], [place] [address]    Special Requests   Final    NONE Performed at [facility], [place] [address], [place], [place] [address]    Culture >=100,000 COLONIES/mL KLEBSIELLA PNEUMONIAE (A)  Final   Report Status 05/04/2021 FINAL  Final  Organism ID, Bacteria KLEBSIELLA PNEUMONIAE (A)  Final      Susceptibility   Klebsiella pneumoniae - MIC*    AMPICILLIN RESISTANT Resistant     CEFAZOLIN <=4 SENSITIVE Sensitive     CEFEPIME <=0.12 SENSITIVE Sensitive     CEFTRIAXONE <=0.25 SENSITIVE Sensitive     CIPROFLOXACIN <=0.25 SENSITIVE Sensitive     GENTAMICIN <=1 SENSITIVE Sensitive     IMIPENEM 0.5 SENSITIVE Sensitive     NITROFURANTOIN 64 INTERMEDIATE Intermediate     TRIMETH/SULFA <=20 SENSITIVE Sensitive     AMPICILLIN/SULBACTAM 4 SENSITIVE Sensitive     PIP/TAZO <=4 SENSITIVE Sensitive     * >=100,000 COLONIES/mL KLEBSIELLA PNEUMONIAE  MRSA Next Gen by PCR, Nasal     Status: None   Collection Time: 05/02/21  6:29 PM   Specimen: Nasal Mucosa; Nasal Swab  Result Value Ref Range Status   MRSA by PCR Next Gen NOT DETECTED NOT DETECTED Final    Comment: (NOTE) The GeneXpert MRSA Assay (FDA approved for NASAL specimens only), is one component of a comprehensive MRSA colonization surveillance program. It is not intended to diagnose MRSA infection nor to guide or monitor treatment for MRSA infections. Test performance is not FDA approved in patients less  than 40 years old. Performed at [facility], [place] [address] [place], [place] [address]          Radiology Studies: No results found.      Scheduled Meds:  atorvastatin  20 mg Oral Daily   Chlorhexidine Gluconate Cloth  6 each Topical Daily   guaiFENesin  600 mg Oral BID   insulin aspart  1-3 Units Subcutaneous Q4H   mouth rinse  15 mL Mouth Rinse BID   pantoprazole  40 mg Oral Daily   tamsulosin  0.4 mg Oral QHS   Continuous Infusions:  sodium chloride 10 mL/hr at 05/04/21 1600   cefTRIAXone (ROCEPHIN)  IV Stopped (05/04/21 1210)   lactated ringers     promethazine (PHENERGAN) injection (IM or IVPB) Stopped (05/03/21 1207)     LOS: 3 days     Cordelia Poche, MD [facility] 05/05/2021, 11:15 AM  If 7PM-7AM, please contact night-coverage [email or web address]

## 2021-05-06 ENCOUNTER — Ambulatory Visit (HOSPITAL_BASED_OUTPATIENT_CLINIC_OR_DEPARTMENT_OTHER): Payer: PPO | Admitting: Obstetrics & Gynecology

## 2021-05-06 DIAGNOSIS — R040 Epistaxis: Secondary | ICD-10-CM

## 2021-05-06 LAB — CBC
HCT: 35.7 % — ABNORMAL LOW (ref 36.0–46.0)
Hemoglobin: 12 g/dL (ref 12.0–15.0)
MCH: 29.6 pg (ref 26.0–34.0)
MCHC: 33.6 g/dL (ref 30.0–36.0)
MCV: 87.9 fL (ref 80.0–100.0)
Platelets: 81 10*3/uL — ABNORMAL LOW (ref 150–400)
RBC: 4.06 MIL/uL (ref 3.87–5.11)
RDW: 13.8 % (ref 11.5–15.5)
WBC: 14 10*3/uL — ABNORMAL HIGH (ref 4.0–10.5)
nRBC: 0 % (ref 0.0–0.2)

## 2021-05-06 LAB — BASIC METABOLIC PANEL
Anion gap: 7 (ref 5–15)
BUN: 29 mg/dL — ABNORMAL HIGH (ref 8–23)
CO2: 21 mmol/L — ABNORMAL LOW (ref 22–32)
Calcium: 8.5 mg/dL — ABNORMAL LOW (ref 8.9–10.3)
Chloride: 110 mmol/L (ref 98–111)
Creatinine, Ser: 1.07 mg/dL — ABNORMAL HIGH (ref 0.44–1.00)
GFR, Estimated: 57 mL/min — ABNORMAL LOW (ref >60)
Glucose, Bld: 211 mg/dL — ABNORMAL HIGH (ref 70–99)
Potassium: 5 mmol/L (ref 3.5–5.1)
Sodium: 138 mmol/L (ref 135–145)

## 2021-05-06 LAB — GLUCOSE, CAPILLARY
Glucose-Capillary: 124 mg/dL — ABNORMAL HIGH (ref 70–99)
Glucose-Capillary: 160 mg/dL — ABNORMAL HIGH (ref 70–99)
Glucose-Capillary: 142 mg/dL — ABNORMAL HIGH (ref 70–99)

## 2021-05-06 LAB — POTASSIUM: Potassium: 3.8 mmol/L (ref 3.5–5.1)

## 2021-05-06 MED ORDER — CEFADROXIL 500 MG PO CAPS
1000.0000 mg | ORAL_CAPSULE | Freq: Two times a day (BID) | ORAL | Status: DC
  Administered 2021-05-06 – 2021-05-07 (×3): 1000 mg via ORAL
  Filled 2021-05-06 (×4): qty 2

## 2021-05-06 MED ORDER — AZITHROMYCIN 250 MG PO TABS
500.0000 mg | ORAL_TABLET | Freq: Every day | ORAL | Status: DC
  Administered 2021-05-06 – 2021-05-07 (×2): 500 mg via ORAL
  Filled 2021-05-06 (×3): qty 2

## 2021-05-06 MED ORDER — DOCUSATE SODIUM 100 MG PO CAPS
100.0000 mg | ORAL_CAPSULE | Freq: Two times a day (BID) | ORAL | Status: DC
  Administered 2021-05-06: 100 mg via ORAL
  Filled 2021-05-06 (×2): qty 1

## 2021-05-06 NOTE — Progress Notes (Signed)
PROGRESS NOTE    Lettie Czarnecki  UKG:[id number] DOB: 12-Jan-1951 DOA: 05/02/2021 PCP: Denita Lung, MD   Brief Narrative: Ahmira Boisselle is a 70 y.o. female with a history of nephrolithiasis and mild intermittent asthma. Patient presented secondary to LLQ/flank pain, nausea/vomiting/diarrhea. She was diagnosed with Sepsis with associated end organ damage. Empiric vancomycin and Cefepime initiated. Urology consulted for left obstructive uropathy and patient underwent cystoscopy with stone removal and stent. Blood and urine cultures significant for klebsiella pneumoniae bacteremia.   Assessment & Plan:   * Severe sepsis ([place]) Secondary to klebsiella UTI and bacteremia. Patient empirically treated with vancomycin and cefepime and was transitioned to Ceftriaxone -Continue Ceftriaxone IV  Epistaxis Likely secondary to oxygen use and thrombocytopenia combination. Hemoglobin stable. -Supportive care  Obstructive uropathy Patient seen and evaluated by urology. Cystoscopy with left ureteroscopy laser lithotripsy/stone extraction/stent placement performed on 11/10. Urology recommendation to remove foley in 3-5 days or prior to discharge, whichever is longer. -Remove foley on 11/15  AKI (acute kidney injury) ([place]) Baseline creatinine of about 0.9. Acute injury secondary to sepsis and obstructive uropathy. Creatinine improving.  Acute respiratory failure with hypoxia ([place]) Initialchest x-ray with atelectasis. Patient with associated dyspnea and cough. Afebrile. Some improvement of symptoms with Albuterol nebulizer. Repeat chest x-ray with concern for possible left basilar pneumonia. -Incentive spirometry, flutter valve -Continue albuterol -Wean to room air -Continue Ceftriaxone for above -Add azithromycin 500 mg PO  Elevated transaminase level Likely secondary to sepsis/infection. Improved.  Thrombocytopenia ([place]) Acute. Likely secondary to sepsis/infection.  Subcutaneous heparin discontinued. Stabilized.  UTI due to Klebsiella species Secondary to kidney stone with resultant obstruction. Urine culture significant for klebsiella pneumoniae. Antibiotics as mentioned above  Bacteremia due to Klebsiella pneumoniae Secondary to UTI/Pyelonephritis. Antibiotics as mentioned above.  Migraine headache with aura Acute episode. Patient is on zonisamide as an outpatient which was held secondary to AKI. Patient with current episode starting 11/12. Given Imitrex which helped shortly. GFR now significantly improved. Migraine aborted after IV Reglan/Toradol/Decadron combination -Continue home zonisamide  Hypomagnesemia-resolved as of 05/05/2021 Given magnesium repletion. Resolved.    DVT prophylaxis: SCDs Code Status:   Code Status: Full Code Family Communication: Wife at bedside Disposition Plan: Discharge possibly home in 24 hours pending transition to oral antibiotics, ability to wean to room air.   Consultants:  PCCM Urology  Procedures:  CYSTOSCOPY WITH LEFT RETROGRADE PYELOGRAM, LEFT URETEROSCOPY LASER LITHOTRIPSY, STONE BASKET EXTRACTION, STENT PLACEMENT (05/02/2021)  Antimicrobials: Vancomycin IV Cefepime IV Ceftriaxone IV    Subjective: Migraine has improved. Feeling overall better than yesterday. Has floaters without vision changes/losss  Objective: Vitals:   05/05/21 1325 05/05/21 2004 05/06/21 0411 05/06/21 0648  BP: 117/68 116/60  (!) 141/76  Pulse: 94 80  (!) 58  Resp: (!) 22 16  16   Temp: 98.8 F (37.1 C) (!) 97.4 F (36.3 C)  98.1 F (36.7 C)  TempSrc: Oral Oral  Oral  SpO2: 99% 96%  98%  Weight:   83.2 kg   Height:        Intake/Output Summary (Last 24 hours) at 05/06/2021 1157 Last data filed at 05/06/2021 0506 Gross per 24 hour  Intake --  Output 1525 ml  Net -1525 ml    Filed Weights   05/04/21 0427 05/05/21 0500 05/06/21 0411  Weight: 68.4 kg 82.2 kg 83.2 kg    Examination:  General exam: Appears  calm and comfortable Respiratory system: Clear to auscultation. Respiratory effort normal. Cardiovascular system: S1 & S2  heard, RRR. No murmurs, rubs, gallops or clicks. Gastrointestinal system: Abdomen is nondistended, soft and nontender. No organomegaly or masses felt. Normal bowel sounds heard. Central nervous system: Alert and oriented. No focal neurological deficits. Musculoskeletal: No calf tenderness Skin: No cyanosis. No rashes Psychiatry: Judgement and insight appear normal. Mood & affect appropriate.     Data Reviewed: I have personally reviewed following labs and imaging studies  CBC Lab Results  Component Value Date   WBC 14.0 (H) 05/06/2021   RBC 4.06 05/06/2021   HGB 12.0 05/06/2021   HCT 35.7 (L) 05/06/2021   MCV 87.9 05/06/2021   MCH 29.6 05/06/2021   PLT 81 (L) 05/06/2021   MCHC 33.6 05/06/2021   RDW 13.8 05/06/2021   LYMPHSABS 0.6 (L) 05/02/2021   MONOABS 0.8 05/02/2021   EOSABS 0.0 05/02/2021   BASOSABS 0.1 08/65/7846     Last metabolic panel Lab Results  Component Value Date   NA 138 05/06/2021   K 5.0 05/06/2021   CL 110 05/06/2021   CO2 21 (L) 05/06/2021   BUN 29 (H) 05/06/2021   CREATININE 1.07 (H) 05/06/2021   GLUCOSE 211 (H) 05/06/2021   GFRNONAA 57 (L) 05/06/2021   GFRAA 79 10/21/2019   CALCIUM 8.5 (L) 05/06/2021   PHOS 4.5 05/03/2021   PROT 5.4 (L) 05/04/2021   ALBUMIN 2.6 (L) 05/04/2021   LABGLOB 2.5 08/23/2020   AGRATIO 1.9 08/23/2020   BILITOT 0.7 05/04/2021   ALKPHOS 191 (H) 05/04/2021   AST 89 (H) 05/04/2021   ALT 114 (H) 05/04/2021   ANIONGAP 7 05/06/2021    CBG (last 3)  Recent Labs    05/06/21 0409 05/06/21 0747 05/06/21 1140  GLUCAP 124* 160* 142*      GFR: Estimated Creatinine Clearance: 48.8 mL/min (A) (by C-G formula based on SCr of 1.07 mg/dL (H)).  Coagulation Profile: Recent Labs  Lab 05/02/21 1228  INR 1.4*     Recent Results (from the past 240 hour(s))  Blood Culture (routine x 2)     Status:  Abnormal   Collection Time: 05/02/21 12:00 PM   Specimen: BLOOD  Result Value Ref Range Status   Specimen Description   Final    BLOOD LEFT ANTECUBITAL Performed at [facility] [address], [place], [place] [address]    Special Requests   Final    BOTTLES DRAWN AEROBIC AND ANAEROBIC Blood Culture results may not be optimal due to an inadequate volume of blood received in culture bottles Performed at [facility] [address], [place], [place] [address]    Culture  Setup Time   Final    GRAM NEGATIVE RODS IN BOTH AEROBIC AND ANAEROBIC BOTTLES CRITICAL RESULT CALLED TO, READ BACK BY AND VERIFIED WITH: Sanda Klein, AT 2440 05/03/21 [place] Performed at [facility], [place] [address], [place], [place] [address]    Culture KLEBSIELLA PNEUMONIAE (A)  Final   Report Status 05/05/2021 FINAL  Final   Organism ID, Bacteria KLEBSIELLA PNEUMONIAE  Final      Susceptibility   Klebsiella pneumoniae - MIC*    AMPICILLIN RESISTANT Resistant     CEFAZOLIN <=4 SENSITIVE Sensitive     CEFEPIME <=0.12 SENSITIVE Sensitive     CEFTAZIDIME <=1 SENSITIVE Sensitive     CEFTRIAXONE <=0.25 SENSITIVE Sensitive     CIPROFLOXACIN <=0.25 SENSITIVE Sensitive     GENTAMICIN <=1 SENSITIVE Sensitive     IMIPENEM 0.5 SENSITIVE Sensitive     TRIMETH/SULFA <=20  SENSITIVE Sensitive     AMPICILLIN/SULBACTAM 8 SENSITIVE Sensitive     PIP/TAZO <=4 SENSITIVE Sensitive     * KLEBSIELLA PNEUMONIAE  Blood Culture ID Panel (Reflexed)     Status: Abnormal   Collection Time: 05/02/21 12:00 PM  Result Value Ref Range Status   Enterococcus faecalis NOT DETECTED NOT DETECTED Final   Enterococcus Faecium NOT DETECTED NOT DETECTED Final   Listeria monocytogenes NOT DETECTED NOT DETECTED Final   Staphylococcus species NOT DETECTED NOT DETECTED Final   Staphylococcus aureus (BCID) NOT DETECTED NOT DETECTED Final   Staphylococcus epidermidis NOT DETECTED NOT DETECTED Final    Staphylococcus lugdunensis NOT DETECTED NOT DETECTED Final   Streptococcus species NOT DETECTED NOT DETECTED Final   Streptococcus agalactiae NOT DETECTED NOT DETECTED Final   Streptococcus pneumoniae NOT DETECTED NOT DETECTED Final   Streptococcus pyogenes NOT DETECTED NOT DETECTED Final   A.calcoaceticus-baumannii NOT DETECTED NOT DETECTED Final   Bacteroides fragilis NOT DETECTED NOT DETECTED Final   Enterobacterales DETECTED (A) NOT DETECTED Final    Comment: Enterobacterales represent a large order of gram negative bacteria, not a single organism. CRITICAL RESULT CALLED TO, READ BACK BY AND VERIFIED WITH: Colin Rhein PHARMD, AT 3419 05/03/21 D. VANHOOK    Enterobacter cloacae complex NOT DETECTED NOT DETECTED Final   Escherichia coli NOT DETECTED NOT DETECTED Final   Klebsiella aerogenes NOT DETECTED NOT DETECTED Final   Klebsiella oxytoca NOT DETECTED NOT DETECTED Final   Klebsiella pneumoniae DETECTED (A) NOT DETECTED Final    Comment: CRITICAL RESULT CALLED TO, READ BACK BY AND VERIFIED WITH: Colin Rhein PHARMD, AT 6222 05/03/21 D. VANHOOK    Proteus species NOT DETECTED NOT DETECTED Final   Salmonella species NOT DETECTED NOT DETECTED Final   Serratia marcescens NOT DETECTED NOT DETECTED Final   Haemophilus influenzae NOT DETECTED NOT DETECTED Final   Neisseria meningitidis NOT DETECTED NOT DETECTED Final   Pseudomonas aeruginosa NOT DETECTED NOT DETECTED Final   Stenotrophomonas maltophilia NOT DETECTED NOT DETECTED Final   Candida albicans NOT DETECTED NOT DETECTED Final   Candida auris NOT DETECTED NOT DETECTED Final   Candida glabrata NOT DETECTED NOT DETECTED Final   Candida krusei NOT DETECTED NOT DETECTED Final   Candida parapsilosis NOT DETECTED NOT DETECTED Final   Candida tropicalis NOT DETECTED NOT DETECTED Final   Cryptococcus neoformans/gattii NOT DETECTED NOT DETECTED Final   CTX-M ESBL NOT DETECTED NOT DETECTED Final   Carbapenem resistance IMP NOT DETECTED NOT  DETECTED Final   Carbapenem resistance KPC NOT DETECTED NOT DETECTED Final   Carbapenem resistance NDM NOT DETECTED NOT DETECTED Final   Carbapenem resist OXA 48 LIKE NOT DETECTED NOT DETECTED Final   Carbapenem resistance VIM NOT DETECTED NOT DETECTED Final    Comment: Performed at [facility], [address], [place], [place] [address]  Blood Culture (routine x 2)     Status: Abnormal   Collection Time: 05/02/21 12:15 PM   Specimen: BLOOD  Result Value Ref Range Status   Specimen Description   Final    BLOOD LEFT ANTECUBITAL Performed at [facility] [address], [place], [address] [address]    Special Requests   Final    BOTTLES DRAWN AEROBIC AND ANAEROBIC Blood Culture results may not be optimal due to an inadequate volume of blood received in culture bottles Performed at [facility] [address], [place], [place] [address]    Culture  Setup Time   Final  GRAM NEGATIVE RODS IN BOTH AEROBIC AND ANAEROBIC BOTTLES CRITICAL VALUE NOTED.  VALUE IS CONSISTENT WITH PREVIOUSLY REPORTED AND CALLED VALUE.    Culture (A)  Final    KLEBSIELLA PNEUMONIAE SUSCEPTIBILITIES PERFORMED ON PREVIOUS CULTURE WITHIN THE LAST 5 DAYS. Performed at [facility], [place] [address] [place], [place] [address]    Report Status 05/05/2021 FINAL  Final  Resp Panel by RT-PCR (Flu A&B, Covid) Nasopharyngeal Swab     Status: None   Collection Time: 05/02/21 12:16 PM   Specimen: Nasopharyngeal Swab; Nasopharyngeal(NP) swabs in vial transport medium  Result Value Ref Range Status   SARS Coronavirus 2 by RT PCR NEGATIVE NEGATIVE Final    Comment: (NOTE) SARS-CoV-2 target nucleic acids are NOT DETECTED.  The SARS-CoV-2 RNA is generally detectable in upper respiratory specimens during the acute phase of infection. The lowest concentration of SARS-CoV-2 viral copies this assay can detect is 138 copies/mL. A negative result does not preclude  SARS-Cov-2 infection and should not be used as the sole basis for treatment or other patient management decisions. A negative result may occur with  improper specimen collection/handling, submission of specimen other than nasopharyngeal swab, presence of viral mutation(s) within the areas targeted by this assay, and inadequate number of viral copies(<138 copies/mL). A negative result must be combined with clinical observations, patient history, and epidemiological information. The expected result is Negative.  Fact Sheet for Patients:  EntrepreneurPulse.com.au  Fact Sheet for Healthcare Providers:  IncredibleEmployment.be  This test is no t yet approved or cleared by the [place] FDA and  has been authorized for detection and/or diagnosis of SARS-CoV-2 by FDA under an Emergency Use Authorization (EUA). This EUA will remain  in effect (meaning this test can be used) for the duration of the COVID-19 declaration under Section 564(b)(1) of the Act, 21 U.S.C.section 360bbb-3(b)(1), unless the authorization is terminated  or revoked sooner.       Influenza A by PCR NEGATIVE NEGATIVE Final   Influenza B by PCR NEGATIVE NEGATIVE Final    Comment: (NOTE) The Xpert Xpress SARS-CoV-2/FLU/RSV plus assay is intended as an aid in the diagnosis of influenza from Nasopharyngeal swab specimens and should not be used as a sole basis for treatment. Nasal washings and aspirates are unacceptable for Xpert Xpress SARS-CoV-2/FLU/RSV testing.  Fact Sheet for Patients: EntrepreneurPulse.com.au  Fact Sheet for Healthcare Providers: IncredibleEmployment.be  This test is not yet approved or cleared by the [place] FDA and has been authorized for detection and/or diagnosis of SARS-CoV-2 by FDA under an Emergency Use Authorization (EUA). This EUA will remain in effect (meaning this test can be used) for the duration of  the COVID-19 declaration under Section 564(b)(1) of the Act, 21 U.S.C. section 360bbb-3(b)(1), unless the authorization is terminated or revoked.  Performed at [facility], [place] [address] [place], [place] [address]   Urine Culture     Status: Abnormal   Collection Time: 05/02/21 12:16 PM   Specimen: In/Out Cath Urine  Result Value Ref Range Status   Specimen Description   Final    IN/OUT CATH URINE Performed at [facility] [address], [place], [place] [address]    Special Requests   Final    NONE Performed at [facility], [place] [address] [place], [place] [address]    Culture >=100,000 COLONIES/mL KLEBSIELLA PNEUMONIAE (A)  Final   Report Status 05/04/2021 FINAL  Final   Organism ID, Bacteria KLEBSIELLA PNEUMONIAE (A)  Final  Susceptibility   Klebsiella pneumoniae - MIC*    AMPICILLIN RESISTANT Resistant     CEFAZOLIN <=4 SENSITIVE Sensitive     CEFEPIME <=0.12 SENSITIVE Sensitive     CEFTRIAXONE <=0.25 SENSITIVE Sensitive     CIPROFLOXACIN <=0.25 SENSITIVE Sensitive     GENTAMICIN <=1 SENSITIVE Sensitive     IMIPENEM 0.5 SENSITIVE Sensitive     NITROFURANTOIN 64 INTERMEDIATE Intermediate     TRIMETH/SULFA <=20 SENSITIVE Sensitive     AMPICILLIN/SULBACTAM 4 SENSITIVE Sensitive     PIP/TAZO <=4 SENSITIVE Sensitive     * >=100,000 COLONIES/mL KLEBSIELLA PNEUMONIAE  MRSA Next Gen by PCR, Nasal     Status: None   Collection Time: 05/02/21  6:29 PM   Specimen: Nasal Mucosa; Nasal Swab  Result Value Ref Range Status   MRSA by PCR Next Gen NOT DETECTED NOT DETECTED Final    Comment: (NOTE) The GeneXpert MRSA Assay (FDA approved for NASAL specimens only), is one component of a comprehensive MRSA colonization surveillance program. It is not intended to diagnose MRSA infection nor to guide or monitor treatment for MRSA infections. Test performance is not FDA approved in patients less than 75  years old. Performed at [facility], [place] [address] [place], [place] [address]          Radiology Studies: DG CHEST PORT 1 VIEW  Result Date: 05/05/2021 CLINICAL DATA:  Hypoxia EXAM: PORTABLE CHEST 1 VIEW COMPARISON:  May 03, 2021 FINDINGS: The cardiomediastinal silhouette is unchanged in contour. No pleural effusion. No pneumothorax. LEFT basilar and retrocardiac opacity. This similar to minimally increased in comparison to prior. Visualized abdomen is unremarkable. IMPRESSION: LEFT basilar opacity is similar to minimally increased in comparison to prior with differential considerations including infection or atelectasis. Electronically Signed   By: Valentino Saxon M.D.   On: 05/05/2021 11:53        Scheduled Meds:  atorvastatin  20 mg Oral Daily   Chlorhexidine Gluconate Cloth  6 each Topical Daily   docusate sodium  100 mg Oral BID   guaiFENesin  600 mg Oral BID   mouth rinse  15 mL Mouth Rinse BID   pantoprazole  40 mg Oral Daily   tamsulosin  0.4 mg Oral QHS   zonisamide  100 mg Oral BID   Continuous Infusions:  sodium chloride 10 mL/hr at 05/04/21 1600   cefTRIAXone (ROCEPHIN)  IV 2 g (05/05/21 1328)   lactated ringers     promethazine (PHENERGAN) injection (IM or IVPB) Stopped (05/03/21 1207)     LOS: 4 days     Cordelia Poche, MD [facility] 05/06/2021, 11:57 AM  If 7PM-7AM, please contact night-coverage [email or web address]

## 2021-05-06 NOTE — Assessment & Plan Note (Addendum)
Likely secondary to oxygen use and thrombocytopenia combination. Hemoglobin stable. Resolved.

## 2021-05-07 DIAGNOSIS — R251 Tremor, unspecified: Secondary | ICD-10-CM

## 2021-05-07 DIAGNOSIS — E669 Obesity, unspecified: Secondary | ICD-10-CM

## 2021-05-07 LAB — COMPREHENSIVE METABOLIC PANEL
ALT: 49 U/L — ABNORMAL HIGH (ref 0–44)
AST: 25 U/L (ref 15–41)
Albumin: 2.2 g/dL — ABNORMAL LOW (ref 3.5–5.0)
Alkaline Phosphatase: 184 U/L — ABNORMAL HIGH (ref 38–126)
Anion gap: 8 (ref 5–15)
BUN: 29 mg/dL — ABNORMAL HIGH (ref 8–23)
CO2: 19 mmol/L — ABNORMAL LOW (ref 22–32)
Calcium: 8 mg/dL — ABNORMAL LOW (ref 8.9–10.3)
Chloride: 114 mmol/L — ABNORMAL HIGH (ref 98–111)
Creatinine, Ser: 0.87 mg/dL (ref 0.44–1.00)
GFR, Estimated: >60 mL/min (ref >60)
Glucose, Bld: 143 mg/dL — ABNORMAL HIGH (ref 70–99)
Potassium: 3.8 mmol/L (ref 3.5–5.1)
Sodium: 141 mmol/L (ref 135–145)
Total Bilirubin: 0.3 mg/dL (ref 0.3–1.2)
Total Protein: 5.5 g/dL — ABNORMAL LOW (ref 6.5–8.1)

## 2021-05-07 LAB — CBC
HCT: 33 % — ABNORMAL LOW (ref 36.0–46.0)
Hemoglobin: 11 g/dL — ABNORMAL LOW (ref 12.0–15.0)
MCH: 29.1 pg (ref 26.0–34.0)
MCHC: 33.3 g/dL (ref 30.0–36.0)
MCV: 87.3 fL (ref 80.0–100.0)
Platelets: 126 10*3/uL — ABNORMAL LOW (ref 150–400)
RBC: 3.78 MIL/uL — ABNORMAL LOW (ref 3.87–5.11)
RDW: 14 % (ref 11.5–15.5)
WBC: 14.2 10*3/uL — ABNORMAL HIGH (ref 4.0–10.5)
nRBC: 0 % (ref 0.0–0.2)

## 2021-05-07 LAB — MAGNESIUM: Magnesium: 1.8 mg/dL (ref 1.7–2.4)

## 2021-05-07 LAB — TSH: TSH: 5.007 u[IU]/mL — ABNORMAL HIGH (ref 0.350–4.500)

## 2021-05-07 LAB — VITAMIN B12: Vitamin B-12: 4375 pg/mL — ABNORMAL HIGH (ref 180–914)

## 2021-05-07 MED ORDER — CEFADROXIL 500 MG PO CAPS: 1000.0000 mg | ORAL_CAPSULE | Freq: Two times a day (BID) | ORAL | 0 refills | Status: AC

## 2021-05-07 MED ORDER — AZITHROMYCIN 500 MG PO TABS: 500.0000 mg | ORAL_TABLET | Freq: Every day | ORAL | 0 refills | Status: AC

## 2021-05-07 MED ORDER — ALBUTEROL SULFATE (2.5 MG/3ML) 0.083% IN NEBU: 2.5000 mg | INHALATION_SOLUTION | RESPIRATORY_TRACT | 0 refills | Status: DC | PRN

## 2021-05-07 MED ORDER — FENTANYL CITRATE PF 50 MCG/ML IJ SOSY
25.0000 ug | PREFILLED_SYRINGE | INTRAMUSCULAR | Status: DC | PRN
Start: 2021-05-07 — End: 2021-05-07
  Administered 2021-05-07: 50 ug via INTRAVENOUS
  Filled 2021-05-07: qty 1

## 2021-05-07 NOTE — Progress Notes (Signed)
Physical Therapy Treatment Patient Details Name: Carol [name] MRN: [id number] DOB: August 15, 1950 Today's Date: 05/07/2021   History of Present Illness Pt is a 70 year old woman admitted on 05/02/21 with weakness, lightheadedness, fall, N/V/D. +urosepis and kidney stone, hypotensive. Underwent L urteroscopy, lithrotripsy and stent placement on 05/02/21. PMH: nephrolitiasis, mild asthma, headaches, OA and anxiety.    PT Comments    General Comments: AxO x 3 very pleasant and active lady (gardening/driving/shopping) Pt with new ABD pain from procedure and Chronic Back pain. Assisted OOB to amb to bathroom first present with mild instability and use of handsto steady self 50% of time.  Otherwise, self able to perform all toileting tasks.  Then performed a BERG balance test. Pt scored 39/56 with greatest diffuculty with one leg stance, alternat step up, tandum stance and feet tight together all in which pt presented with LOB.  Then assisted with practicing a flight of stairs.  General stair comments: Had pt perform stairs as she did priot present with instbility esp with downward.  Pt advised to hold "both hands" to her one rail and "not to carry anything" up/down stairs. Instructed on some standing TE's and "Mini Squats" to increase B LE strength as well as engage core. Assisted back to bed and positioned sidelying.   Pt plans to D/C to home.  Has access to  walker "if needed" for community and would benefit from OP PT Balance Program  Recommendations for follow up therapy are one component of a multi-disciplinary discharge planning process, led by the attending physician.  Recommendations may be updated based on patient status, additional functional criteria and insurance authorization.  Follow Up Recommendations  Outpatient PT (Balance Program)     Assistance Recommended at Discharge Frequent or constant Supervision/Assistance  Equipment Recommendations  None recommended by PT     Recommendations for Other Services       Precautions / Restrictions Precautions Precautions: Fall Restrictions Weight Bearing Restrictions: No     Mobility  Bed Mobility Overal bed mobility: Modified Independent             General bed mobility comments: pt self able with increased time.  C/O back pain which is "not new" but usually sleeps on her side at home.  Here she was not.    Transfers Overall transfer level: Needs assistance Equipment used: Rolling walker (2 wheels) Transfers: Sit to/from Stand Sit to Stand: Supervision           General transfer comment: good safety cognition and 50% need/use for hands to steady self.  Increased ABD pain from proceedure.    Ambulation/Gait Ambulation/Gait assistance: Supervision;Min guard Gait Distance (Feet): 200 Feet Assistive device: Rolling walker (2 wheels);None Gait Pattern/deviations: Step-through pattern;Decreased step length - right;Decreased step length - left;Shuffle;Trunk flexed Gait velocity: decreased     General Gait Details: slight gait instability noted with rec for walker in community use "as needed" pt discretion.  Will not need in home.   Stairs Stairs: Yes Stairs assistance: Supervision;Min guard Stair Management: One rail Right;Step to pattern Number of Stairs: 8 General stair comments: Had pt perform stairs as she did priot present with instbility esp with downward.  Pt advised to hold "both hands" to her one rail and "not to carry anything" up/down stairs.   Wheelchair Mobility    Modified Rankin (Stroke Patients Only)       Balance Overall balance assessment: Needs assistance   Sitting balance-Leahy Scale: Good  Standing balance-Leahy Scale: Poor                   Standardized Balance Assessment Standardized Balance Assessment : Berg Balance Test Berg Balance Test Sit to Stand: Able to stand without using hands and stabilize independently Standing Unsupported:  Able to stand safely 2 minutes Sitting with Back Unsupported but Feet Supported on Floor or Stool: Able to sit safely and securely 2 minutes Stand to Sit: Sits safely with minimal use of hands Transfers: Able to transfer safely, minor use of hands Standing Unsupported with Eyes Closed: Able to stand 3 seconds (had right lateral sway near LOB after 25 seconds) Standing Ubsupported with Feet Together: Needs help to attain position and unable to hold for 15 seconds From Standing, Reach Forward with Outstretched Arm: Can reach confidently >25 cm (10") (pt self increased her BOS to perform) From Standing Position, Pick up Object from Floor: Able to pick up shoe safely and easily From Standing Position, Turn to Look Behind Over each Shoulder: Looks behind from both sides and weight shifts well Turn 360 Degrees: Able to turn 360 degrees safely in 4 seconds or less Standing Unsupported, Alternately Place Feet on Step/Stool: Able to complete >2 steps/needs minimal assist Standing Unsupported, One Foot in Front: Loses balance while stepping or standing Standing on One Leg: Unable to try or needs assist to prevent fall Total Score: 39        Cognition Arousal/Alertness: Awake/alert Behavior During Therapy: WFL for tasks assessed/performed Overall Cognitive Status: Within Functional Limits for tasks assessed                                 General Comments: AxO x 3 very pleasant and active lady (gardening/driving/shopping)        Exercises      General Comments General comments (skin integrity, edema, etc.): pt scored 39/56 with greatest diffuculty with one leg stance, alternat step up, tandum stance and feet tight together all in which pt presented with LOB      Pertinent Vitals/Pain Pain Assessment: Faces Faces Pain Scale: Hurts little more Pain Location: back (chronic) Pain Descriptors / Indicators: Grimacing;Guarding Pain Intervention(s): Monitored during  session;Repositioned    Home Living                          Prior Function            PT Goals (current goals can now be found in the care plan section) Progress towards PT goals: Progressing toward goals    Frequency    Min 3X/week      PT Plan Other (comment) (pt would benefit from OP PT Balance Program)    Co-evaluation              AM-PAC PT "6 Clicks" Mobility   Outcome Measure  Help needed turning from your back to your side while in a flat bed without using bedrails?: None Help needed moving from lying on your back to sitting on the side of a flat bed without using bedrails?: None Help needed moving to and from a bed to a chair (including a wheelchair)?: None Help needed standing up from a chair using your arms (e.g., wheelchair or bedside chair)?: A Little Help needed to walk in hospital room?: A Little Help needed climbing 3-5 steps with a railing? : A Little 6 Click Score: 21  End of Session Equipment Utilized During Treatment: Gait belt Activity Tolerance: Patient tolerated treatment well Patient left: in bed;with call bell/phone within reach Nurse Communication: Mobility status PT Visit Diagnosis: Unsteadiness on feet (R26.81);Difficulty in walking, not elsewhere classified (R26.2)     Time: 0979-4997 PT Time Calculation (min) (ACUTE ONLY): 40 min  Charges:  $Gait Training: 23-37 mins $Therapeutic Activity: 8-22 mins                     Rica Koyanagi  PTA Acute  Rehabilitation Services Pager      [phone] Office      [phone]

## 2021-05-07 NOTE — Discharge Instructions (Addendum)
[place] were in the hospital with infection involving your urinary system that was related to an obstructing kidney stone. You also have infection within your blood stream (bacteremia). You were treated with a stent by urology in addition to IV antibiotics with significant improvement of your infection. While admitted, you developed symptoms concerning for pneumonia, so your antibiotics have been adjusted. You also developed a tremor. I discussed your tremor with the neurologist on-call who agreed this is possibly related to your recent illness and will hopefully improve with time; if no improvement within a few weeks, he recommends outpatient neurology follow-up, however. Please follow-up with your PCP. The urologist has already scheduled an appointment for you which is included in your discharge paperwork.  Cordelia Poche, MD [facility]

## 2021-05-07 NOTE — Assessment & Plan Note (Signed)
Body mass index is 36.08 kg/m.

## 2021-05-07 NOTE — Discharge Summary (Signed)
Physician Discharge Summary  Carol [name] UXL:[id number] DOB: Nov 01, 1950 DOA: 05/02/2021  PCP: Carol Lung, MD  Admit date: 05/02/2021 Discharge date: 05/07/2021  Admitted From: Home Disposition: Home  Recommendations for Outpatient Follow-up:  Follow up with PCP in 1 week Follow up with urology on 11/17 Please obtain BMP/CBC in one week Please follow up on the following pending results: TSH, Vitamin B1/B12  Home Health: Outpatient PT Equipment/Devices: None  Discharge Condition: Stable CODE STATUS: Full code Diet recommendation: Regular diet   Brief/Interim Summary:  Admission HPI written by Carol Hy, DO   History of Present Illness: Carol [name] is a 69 y/o woman with a history of nephrolithiasis and mild intermittent asthma who presented to the ED today with LLQ and flank pain, lightheadedness, weakness, and n/v/d. She was diagnosed with a kidney stone yesterday at a urology appointment after 1 week of abdominal pain. During her visit they had trouble getting a BP but she was able to walk around. Last night when she got up to go to the bathroom in the middle of the night she felt weak and fell forward, hitting her forehead due to weakness and lightheadedness, but was caught before she fell to the floor. She did not lose consciousness. She has a history of previous kidney stones but has not had issues with feeling this bad from them before.  Today she complains of chills. Upon presentation to the ED she was hypotensive and given a fluid bolus. She had a CT scan demonstrating hydronephrosis. Urology is planning for a procedure this afternoon. She was given 7L IVF bolus and started on ceftriaxone in the ED.   Hospital course:  * Severe sepsis (HCC)-resolved as of 05/07/2021 Secondary to klebsiella UTI and bacteremia. Patient empirically treated with vancomycin and cefepime and was transitioned to Ceftriaxone. Ceftriaxone transitioned to Cefadroxil.    Tremor Discussed with neurology. Likely physiologic tremor related to hospitalization and illness. Medications correlating with timing of symptoms reviewed and appears to be unlikely related to medication adverse effect. Recommendation for observation for a few weeks with outpatient neurology follow-up if persists. TSH, Vitamin B12 and B1 pending on discharge.  Elevated transaminase level Likely secondary to sepsis/infection. Improved.  Thrombocytopenia ([place]) Acute. Likely secondary to sepsis/infection. Subcutaneous heparin discontinued. Improving prior to discharge.  UTI due to Klebsiella species Secondary to kidney stone with resultant obstruction. Urine culture significant for klebsiella pneumoniae. Antibiotics as mentioned above  Bacteremia due to Klebsiella pneumoniae Secondary to UTI/Pyelonephritis. Antibiotics as mentioned above.  Epistaxis-resolved as of 05/07/2021 Likely secondary to oxygen use and thrombocytopenia combination. Hemoglobin stable. Resolved.  Obstructive uropathy-resolved as of 05/07/2021 Patient seen and evaluated by urology. Cystoscopy with left ureteroscopy laser lithotripsy/stone extraction/stent placement performed on 11/10. Urology recommendation to remove foley in 3-5 days or prior to discharge, whichever is longer. Foley removed on 11/14.  Hypomagnesemia-resolved as of 05/05/2021 Given magnesium repletion. Resolved.  AKI (acute kidney injury) (HCC)-resolved as of 05/07/2021 Baseline creatinine of about 0.9. Acute injury secondary to sepsis and obstructive uropathy. Creatinine improving.  Acute respiratory failure with hypoxia (HCC)-resolved as of 05/07/2021 Initialchest x-ray with atelectasis. Patient with associated dyspnea and cough. Afebrile. Some improvement of symptoms with Albuterol nebulizer. Repeat chest x-ray with concern for possible left basilar pneumonia. Incentive spirometry and flutter valve prescribed. Given a prescription for albuterol  nebulizer and patient to discharge on Cefadroxil and Azithromycin.  Migraine headache with aura-resolved as of 05/07/2021 Acute episode. Patient is on zonisamide as an outpatient which  was held secondary to AKI. Patient with current episode starting 11/12. Given Imitrex which helped shortly. GFR now significantly improved. Migraine aborted after IV Reglan/Toradol/Decadron combination Continue home zonisamide.  Obesity (BMI 30-39.9) Body mass index is 36.08 kg/m.   Discharge Diagnoses:  Principal Problem:   Severe sepsis ([place]) Active Problems:   Migraine headache with aura   Bacteremia due to Klebsiella pneumoniae   UTI due to Klebsiella species   Thrombocytopenia (HCC)   Elevated transaminase level   Acute respiratory failure with hypoxia (HCC)   AKI (acute kidney injury) ([place])   Obstructive uropathy   Epistaxis    Discharge Instructions   Allergies as of 05/07/2021       Reactions   Iohexol Anaphylaxis    Code: HIVES, Desc: hives during ivp '96, ok w/ 13 hr prep @ [facility] Onset Date: 57322025  Desc: hives   Ivp Dye [iodinated Diagnostic Agents] Anaphylaxis   Codeine Nausea And Vomiting   Onion Other (See Comments)   Red Onion Only - Nausea, Vomiting and Passes Out        Medication List     TAKE these medications    albuterol 108 (90 Base) MCG/ACT inhaler Commonly known as: ProAir HFA INHALE 2 PUFFS INTO THE LUNGS EVERY 6 HOURS AS NEEDED FOR WHEEZING/ SHORTNESS OF BREATH. What changed:  how much to take how to take this when to take this reasons to take this additional instructions   albuterol (2.5 MG/3ML) 0.083% nebulizer solution Commonly known as: PROVENTIL Take 3 mLs (2.5 mg total) by nebulization every 4 (four) hours as needed for wheezing or shortness of breath (while at home). What changed: You were already taking a medication with the same name, and this prescription was added. Make sure you understand how and when to take each.   atorvastatin  20 MG tablet Commonly known as: LIPITOR Take 1 tablet (20 mg total) by mouth daily.   azithromycin 500 MG tablet Commonly known as: ZITHROMAX Take 1 tablet (500 mg total) by mouth daily for 3 days. Start taking on: May 08, 2021   cefadroxil 500 MG capsule Commonly known as: DURICEF Take 2 capsules (1,000 mg total) by mouth 2 (two) times daily for 5 days.   diclofenac sodium 1 % Gel Commonly known as: VOLTAREN Apply 2 g topically 4 (four) times daily.   HYDROcodone-acetaminophen 5-325 MG tablet Commonly known as: NORCO/VICODIN Take 1-2 tablets by mouth every 6 (six) hours as needed for pain.   ketorolac 10 MG tablet Commonly known as: TORADOL Take 10 mg by mouth every 8 (eight) hours as needed for pain.   methocarbamol 500 MG tablet Commonly known as: ROBAXIN Take 500 mg by mouth daily as needed (migraine).   ondansetron 4 MG tablet Commonly known as: ZOFRAN Take 4 mg by mouth every 8 (eight) hours as needed for nausea/vomiting.   pantoprazole 40 MG tablet Commonly known as: PROTONIX Take 1 tablet (40 mg total) by mouth daily.   Qvar RediHaler 40 MCG/ACT inhaler Generic drug: beclomethasone Inhale 2 puffs into the lungs 2 (two) times daily. What changed:  when to take this reasons to take this   tamsulosin 0.4 MG Caps capsule Commonly known as: FLOMAX Take 0.4 mg by mouth at bedtime.   valACYclovir 500 MG tablet Commonly known as: VALTREX Take 1 tablet (500 mg total) by mouth daily. What changed:  when to take this reasons to take this   zonisamide 100 MG capsule Commonly known as: ZONEGRAN Take 100 mg  by mouth 2 (two) times daily.               Durable Medical Equipment  (From admission, onward)           Start     Ordered   Unscheduled  For home use only DME Nebulizer machine  Once       Question Answer Comment  Patient needs a nebulizer to treat with the following condition Pneumonia   Length of Need 6 Months      05/07/21 1342             Follow-up Information     Alexis Frock, MD Follow up on 05/09/2021.   Specialty: Urology Why: at 9:45 AM with NP Augustina Mood information: [place] [address] [phone]         Carol Lung, MD. Schedule an appointment as soon as possible for a visit in 1 week(s).   Specialty: Family Medicine Why: For hospital follow-up Contact information: [place] [address] [phone]                Allergies  Allergen Reactions   Iohexol Anaphylaxis     Code: HIVES, Desc: hives during ivp '96, ok w/ 13 hr prep @ [facility] Onset Date: [id number]   Desc: hives    Ivp Dye [Iodinated Diagnostic Agents] Anaphylaxis   Codeine Nausea And Vomiting   Onion Other (See Comments)    Red Onion Only - Nausea, Vomiting and Passes Out    Consultations: Urology   Procedures/Studies: US RENAL  Result Date: 05/02/2021 CLINICAL DATA:  Hydronephrosis. Recent kidney stone diagnosis. Numerous lithotripsies. Stone removal. EXAM: RENAL / URINARY TRACT ULTRASOUND COMPLETE COMPARISON:  01/02/2018 CT FINDINGS: Right Kidney: Renal measurements: 8.9 x 3.9 x 5.7 cm = volume: 103 mL. Echogenicity within normal limits. No mass or hydronephrosis visualized. Left Kidney: Renal measurements: 10.9 x 5.6 x 7.2 cm = volume: 234 mL. Echogenicity within normal limits. No mass or hydronephrosis visualized. Bladder: Decompressed. Other: None. IMPRESSION: No evidence of hydronephrosis.  Normal renal ultrasound for age. Electronically Signed   By: Abigail Miyamoto M.D.   On: 05/02/2021 14:06   DG CHEST PORT 1 VIEW  Result Date: 05/05/2021 CLINICAL DATA:  Hypoxia EXAM: PORTABLE CHEST 1 VIEW COMPARISON:  May 03, 2021 FINDINGS: The cardiomediastinal silhouette is unchanged in contour. No pleural effusion. No pneumothorax. LEFT basilar and retrocardiac opacity. This similar to minimally increased in comparison to prior. Visualized abdomen is  unremarkable. IMPRESSION: LEFT basilar opacity is similar to minimally increased in comparison to prior with differential considerations including infection or atelectasis. Electronically Signed   By: Valentino Saxon M.D.   On: 05/05/2021 11:53   DG CHEST PORT 1 VIEW  Result Date: 05/03/2021 CLINICAL DATA:  Dyspnea 1 EXAM: PORTABLE CHEST 1 VIEW COMPARISON:  Prior chest x-ray 05/02/2021 FINDINGS: Stable cardiac and mediastinal contours. Improved inspiration. Linear opacity in the retrocardiac region likely reflects residual atelectasis. No pleural effusion, pneumothorax or focal airspace infiltrate. IMPRESSION: Improved aeration with larger [name] volumes. Persistent linear opacity in the left [name] base favored to reflect atelectasis. Electronically Signed   By: Jacqulynn Cadet M.D.   On: 05/03/2021 09:17   DG Chest Port 1 View  Result Date: 05/02/2021 CLINICAL DATA:  Questionable sepsis - evaluate for abnormality EXAM: PORTABLE CHEST 1 VIEW COMPARISON:  06/28/2014. FINDINGS: Low [name] volumes. No consolidation. No visible pleural effusions or pneumothorax. Mildly enlarged cardiomediastinal silhouette, likely exaggerated by low [name]  volumes and AP technique. No acute osseous abnormality. IMPRESSION: Low [name] volumes limits evaluation of the [name] bases without definite evidence of acute cardiopulmonary disease. Dedicated PA and lateral radiographs could better evaluate if clinically indicated. Electronically Signed   By: Margaretha Sheffield M.D.   On: 05/02/2021 13:37   DG C-Arm 1-60 Min-No Report  Result Date: 05/02/2021 Fluoroscopy was utilized by the requesting physician.  No radiographic interpretation.   [place] EKG SITE RITE  Result Date: 05/02/2021 If Site Rite image not attached, placement could not be confirmed due to current cardiac rhythm.    Subjective: Tremor which she has noted for the past few days; not present prior to admission.  Discharge Exam: Vitals:   05/07/21 0334 05/07/21  1300  BP: 133/80 138/83  Pulse: 78 74  Resp: 20 20  Temp: 98.7 F (37.1 C) 98.5 F (36.9 C)  SpO2: 97% 98%   Vitals:   05/06/21 2030 05/07/21 0104 05/07/21 0334 05/07/21 1300  BP: 126/70  133/80 138/83  Pulse: 77  78 74  Resp: 20  20 20   Temp: 98.9 F (37.2 C)  98.7 F (37.1 C) 98.5 F (36.9 C)  TempSrc: Oral  Oral Oral  SpO2: 98%  97% 98%  Weight: 83.8 kg 83.8 kg    Height:        General: Pt is alert, awake, not in acute distress Cardiovascular: RRR, S1/S2 +, no rubs, no gallops Respiratory: CTA bilaterally, no wheezing, no rhonchi Abdominal: Soft, NT, ND, bowel sounds + Extremities: no edema, no cyanosis Neuro: Action tremor of bilateral upper and lower extremities. 3+ bicep/forearm/patella reflexes. Could not illicit achilles reflexes. No clonus. No asterixis.    The results of significant diagnostics from this hospitalization (including imaging, microbiology, ancillary and laboratory) are listed below for reference.     Microbiology: Recent Results (from the past 240 hour(s))  Blood Culture (routine x 2)     Status: Abnormal   Collection Time: 05/02/21 12:00 PM   Specimen: BLOOD  Result Value Ref Range Status   Specimen Description   Final    BLOOD LEFT ANTECUBITAL Performed at [facility] [address] [place], [place] [address]    Special Requests   Final    BOTTLES DRAWN AEROBIC AND ANAEROBIC Blood Culture results may not be optimal due to an inadequate volume of blood received in culture bottles Performed at [facility] [address] [place], [place] [address]    Culture  Setup Time   Final    GRAM NEGATIVE RODS IN BOTH AEROBIC AND ANAEROBIC BOTTLES CRITICAL RESULT CALLED TO, READ BACK BY AND VERIFIED WITH: Sanda Klein, AT 2703 05/03/21 [place] Performed at [facility], [place] [address], [place], [place] [address]    Culture KLEBSIELLA PNEUMONIAE (A)  Final   Report Status 05/05/2021 FINAL   Final   Organism ID, Bacteria KLEBSIELLA PNEUMONIAE  Final      Susceptibility   Klebsiella pneumoniae - MIC*    AMPICILLIN RESISTANT Resistant     CEFAZOLIN <=4 SENSITIVE Sensitive     CEFEPIME <=0.12 SENSITIVE Sensitive     CEFTAZIDIME <=1 SENSITIVE Sensitive     CEFTRIAXONE <=0.25 SENSITIVE Sensitive     CIPROFLOXACIN <=0.25 SENSITIVE Sensitive     GENTAMICIN <=1 SENSITIVE Sensitive     IMIPENEM 0.5 SENSITIVE Sensitive     TRIMETH/SULFA <=20 SENSITIVE Sensitive     AMPICILLIN/SULBACTAM 8 SENSITIVE Sensitive     PIP/TAZO <=4 SENSITIVE Sensitive     *  KLEBSIELLA PNEUMONIAE  Blood Culture ID Panel (Reflexed)     Status: Abnormal   Collection Time: 05/02/21 12:00 PM  Result Value Ref Range Status   Enterococcus faecalis NOT DETECTED NOT DETECTED Final   Enterococcus Faecium NOT DETECTED NOT DETECTED Final   Listeria monocytogenes NOT DETECTED NOT DETECTED Final   Staphylococcus species NOT DETECTED NOT DETECTED Final   Staphylococcus aureus (BCID) NOT DETECTED NOT DETECTED Final   Staphylococcus epidermidis NOT DETECTED NOT DETECTED Final   Staphylococcus lugdunensis NOT DETECTED NOT DETECTED Final   Streptococcus species NOT DETECTED NOT DETECTED Final   Streptococcus agalactiae NOT DETECTED NOT DETECTED Final   Streptococcus pneumoniae NOT DETECTED NOT DETECTED Final   Streptococcus pyogenes NOT DETECTED NOT DETECTED Final   A.calcoaceticus-baumannii NOT DETECTED NOT DETECTED Final   Bacteroides fragilis NOT DETECTED NOT DETECTED Final   Enterobacterales DETECTED (A) NOT DETECTED Final    Comment: Enterobacterales represent a large order of gram negative bacteria, not a single organism. CRITICAL RESULT CALLED TO, READ BACK BY AND VERIFIED WITH: Colin Rhein PHARMD, AT 6834 05/03/21 D. [place]AN[place]    Enterobacter cloacae complex NOT DETECTED NOT DETECTED Final   Escherichia coli NOT DETECTED NOT DETECTED Final   Klebsiella aerogenes NOT DETECTED NOT DETECTED Final   Klebsiella oxytoca  NOT DETECTED NOT DETECTED Final   Klebsiella pneumoniae DETECTED (A) NOT DETECTED Final    Comment: CRITICAL RESULT CALLED TO, READ BACK BY AND VERIFIED WITH: Colin Rhein PHARMD, AT 1962 05/03/21 D. VANHOOK    Proteus species NOT DETECTED NOT DETECTED Final   Salmonella species NOT DETECTED NOT DETECTED Final   Serratia marcescens NOT DETECTED NOT DETECTED Final   Haemophilus influenzae NOT DETECTED NOT DETECTED Final   Neisseria meningitidis NOT DETECTED NOT DETECTED Final   Pseudomonas aeruginosa NOT DETECTED NOT DETECTED Final   Stenotrophomonas maltophilia NOT DETECTED NOT DETECTED Final   Candida albicans NOT DETECTED NOT DETECTED Final   Candida auris NOT DETECTED NOT DETECTED Final   Candida glabrata NOT DETECTED NOT DETECTED Final   Candida krusei NOT DETECTED NOT DETECTED Final   Candida parapsilosis NOT DETECTED NOT DETECTED Final   Candida tropicalis NOT DETECTED NOT DETECTED Final   Cryptococcus neoformans/gattii NOT DETECTED NOT DETECTED Final   CTX-M ESBL NOT DETECTED NOT DETECTED Final   Carbapenem resistance IMP NOT DETECTED NOT DETECTED Final   Carbapenem resistance KPC NOT DETECTED NOT DETECTED Final   Carbapenem resistance NDM NOT DETECTED NOT DETECTED Final   Carbapenem resist OXA 48 LIKE NOT DETECTED NOT DETECTED Final   Carbapenem resistance VIM NOT DETECTED NOT DETECTED Final    Comment: Performed at [facility], [address], [place], [place] [address]  Blood Culture (routine x 2)     Status: Abnormal   Collection Time: 05/02/21 12:15 PM   Specimen: BLOOD  Result Value Ref Range Status   Specimen Description   Final    BLOOD LEFT ANTECUBITAL Performed at [facility] [address], [place], [place] [address]    Special Requests   Final    BOTTLES DRAWN AEROBIC AND ANAEROBIC Blood Culture results may not be optimal due to an inadequate volume of blood received in culture bottles Performed at [facility]  [address] [place], [place] [address]    Culture  Setup Time   Final    GRAM NEGATIVE RODS IN BOTH AEROBIC AND ANAEROBIC BOTTLES CRITICAL VALUE NOTED.  VALUE IS CONSISTENT WITH PREVIOUSLY REPORTED AND CALLED VALUE.  Culture (A)  Final    KLEBSIELLA PNEUMONIAE SUSCEPTIBILITIES PERFORMED ON PREVIOUS CULTURE WITHIN THE LAST 5 DAYS. Performed at [facility], [place] [address] [place], [place] [address]    Report Status 05/05/2021 FINAL  Final  Resp Panel by RT-PCR (Flu A&B, Covid) Nasopharyngeal Swab     Status: None   Collection Time: 05/02/21 12:16 PM   Specimen: Nasopharyngeal Swab; Nasopharyngeal(NP) swabs in vial transport medium  Result Value Ref Range Status   SARS Coronavirus 2 by RT PCR NEGATIVE NEGATIVE Final    Comment: (NOTE) SARS-CoV-2 target nucleic acids are NOT DETECTED.  The SARS-CoV-2 RNA is generally detectable in upper respiratory specimens during the acute phase of infection. The lowest concentration of SARS-CoV-2 viral copies this assay can detect is 138 copies/mL. A negative result does not preclude SARS-Cov-2 infection and should not be used as the sole basis for treatment or other patient management decisions. A negative result may occur with  improper specimen collection/handling, submission of specimen other than nasopharyngeal swab, presence of viral mutation(s) within the areas targeted by this assay, and inadequate number of viral copies(<138 copies/mL). A negative result must be combined with clinical observations, patient history, and epidemiological information. The expected result is Negative.  Fact Sheet for Patients:  EntrepreneurPulse.com.au  Fact Sheet for Healthcare Providers:  IncredibleEmployment.be  This test is no t yet approved or cleared by the [place] FDA and  has been authorized for detection and/or diagnosis of SARS-CoV-2 by FDA under an Emergency Use Authorization (EUA). This EUA will  remain  in effect (meaning this test can be used) for the duration of the COVID-19 declaration under Section 564(b)(1) of the Act, 21 U.S.C.section 360bbb-3(b)(1), unless the authorization is terminated  or revoked sooner.       Influenza A by PCR NEGATIVE NEGATIVE Final   Influenza B by PCR NEGATIVE NEGATIVE Final    Comment: (NOTE) The Xpert Xpress SARS-CoV-2/FLU/RSV plus assay is intended as an aid in the diagnosis of influenza from Nasopharyngeal swab specimens and should not be used as a sole basis for treatment. Nasal washings and aspirates are unacceptable for Xpert Xpress SARS-CoV-2/FLU/RSV testing.  Fact Sheet for Patients: EntrepreneurPulse.com.au  Fact Sheet for Healthcare Providers: IncredibleEmployment.be  This test is not yet approved or cleared by the [place] FDA and has been authorized for detection and/or diagnosis of SARS-CoV-2 by FDA under an Emergency Use Authorization (EUA). This EUA will remain in effect (meaning this test can be used) for the duration of the COVID-19 declaration under Section 564(b)(1) of the Act, 21 U.S.C. section 360bbb-3(b)(1), unless the authorization is terminated or revoked.  Performed at [facility], [place] [address], [place], [place] [address]   Urine Culture     Status: Abnormal   Collection Time: 05/02/21 12:16 PM   Specimen: In/Out Cath Urine  Result Value Ref Range Status   Specimen Description   Final    IN/OUT CATH URINE Performed at [facility] [address], [place], [place] [address]    Special Requests   Final    NONE Performed at [facility], [place] [address], [place], [place] [address]    Culture >=100,000 COLONIES/mL KLEBSIELLA PNEUMONIAE (A)  Final   Report Status 05/04/2021 FINAL  Final   Organism ID, Bacteria KLEBSIELLA PNEUMONIAE (A)  Final      Susceptibility   Klebsiella pneumoniae - MIC*    AMPICILLIN  RESISTANT Resistant     CEFAZOLIN <=4 SENSITIVE  Sensitive     CEFEPIME <=0.12 SENSITIVE Sensitive     CEFTRIAXONE <=0.25 SENSITIVE Sensitive     CIPROFLOXACIN <=0.25 SENSITIVE Sensitive     GENTAMICIN <=1 SENSITIVE Sensitive     IMIPENEM 0.5 SENSITIVE Sensitive     NITROFURANTOIN 64 INTERMEDIATE Intermediate     TRIMETH/SULFA <=20 SENSITIVE Sensitive     AMPICILLIN/SULBACTAM 4 SENSITIVE Sensitive     PIP/TAZO <=4 SENSITIVE Sensitive     * >=100,000 COLONIES/mL KLEBSIELLA PNEUMONIAE  MRSA Next Gen by PCR, Nasal     Status: None   Collection Time: 05/02/21  6:29 PM   Specimen: Nasal Mucosa; Nasal Swab  Result Value Ref Range Status   MRSA by PCR Next Gen NOT DETECTED NOT DETECTED Final    Comment: (NOTE) The GeneXpert MRSA Assay (FDA approved for NASAL specimens only), is one component of a comprehensive MRSA colonization surveillance program. It is not intended to diagnose MRSA infection nor to guide or monitor treatment for MRSA infections. Test performance is not FDA approved in patients less than 18 years old. Performed at [facility], [place] [address] [place], [place] [address]      Labs: BNP (last 3 results) No results for input(s): BNP in the last 8760 hours. Basic Metabolic Panel: Recent Labs  Lab 05/03/21 0254 05/04/21 0305 05/04/21 1318 05/05/21 0549 05/06/21 0915 05/06/21 1425 05/07/21 0442  NA 139 138  --  138 138  --  141  K 4.3 3.7  --  3.6 5.0 3.8 3.8  CL 113* 112*  --  112* 110  --  114*  CO2 16* 17*  --  20* 21*  --  19*  GLUCOSE 118* 93  --  106* 211*  --  143*  BUN 39* 43*  --  30* 29*  --  29*  CREATININE 2.39* 1.98*  --  1.31* 1.07*  --  0.87  CALCIUM 7.3* 7.8*  --  8.1* 8.5*  --  8.0*  MG 1.4*  --  2.8* 2.2  --   --  1.8  PHOS 4.5  --   --   --   --   --   --    Liver Function Tests: Recent Labs  Lab 05/02/21 1216 05/04/21 0305 05/07/21 0442  AST 219* 89* 25  ALT 246* 114* 49*  ALKPHOS 127* 191* 184*  BILITOT 0.9 0.7  0.3  PROT 6.6 5.4* 5.5*  ALBUMIN 3.6 2.6* 2.2*   No results for input(s): LIPASE, AMYLASE in the last 168 hours. No results for input(s): AMMONIA in the last 168 hours. CBC: Recent Labs  Lab 05/02/21 1216 05/03/21 0254 05/04/21 0305 05/05/21 0549 05/06/21 0915 05/07/21 0446  WBC 21.8* 18.2* 15.3* 12.3* 14.0* 14.2*  NEUTROABS 19.4*  --   --   --   --   --   HGB 13.0 11.2* 10.9* 10.9* 12.0 11.0*  HCT 39.9 34.8* 32.1* 32.6* 35.7* 33.0*  MCV 90.5 91.6 88.4 89.1 87.9 87.3  PLT 125* 79* 67* 71* 81* 126*   Cardiac Enzymes: No results for input(s): CKTOTAL, CKMB, CKMBINDEX, TROPONINI in the last 168 hours. BNP: Invalid input(s): POCBNP CBG: Recent Labs  Lab 05/05/21 2007 05/05/21 2350 05/06/21 0409 05/06/21 0747 05/06/21 1140  GLUCAP 185* 172* 124* 160* 142*   D-Dimer No results for input(s): DDIMER in the last 72 hours. Hgb A1c No results for input(s): HGBA1C in the last 72 hours. Lipid Profile No results for input(s): CHOL, HDL, LDLCALC, TRIG, CHOLHDL, LDLDIRECT in  the last 72 hours. Thyroid function studies No results for input(s): TSH, T4TOTAL, T3FREE, THYROIDAB in the last 72 hours.  Invalid input(s): FREET3 Anemia work up No results for input(s): VITAMINB12, FOLATE, FERRITIN, TIBC, IRON, RETICCTPCT in the last 72 hours. Urinalysis    Component Value Date/Time   COLORURINE AMBER (A) 05/02/2021 1216   APPEARANCEUR CLOUDY (A) 05/02/2021 1216   LABSPEC 1.017 05/02/2021 1216   LABSPEC 1.025 05/24/2020 1042   PHURINE 5.0 05/02/2021 1216   GLUCOSEU NEGATIVE 05/02/2021 1216   HGBUR MODERATE (A) 05/02/2021 1216   BILIRUBINUR NEGATIVE 05/02/2021 1216   BILIRUBINUR negative 05/24/2020 1042   BILIRUBINUR n 07/11/2019 0838   KETONESUR NEGATIVE 05/02/2021 1216   PROTEINUR 30 (A) 05/02/2021 1216   UROBILINOGEN negative (A) 07/11/2019 0838   UROBILINOGEN 0.2 09/02/2013 0938   NITRITE NEGATIVE 05/02/2021 1216   LEUKOCYTESUR LARGE (A) 05/02/2021 1216   Sepsis  Labs Invalid input(s): PROCALCITONIN,  WBC,  LACTICIDVEN Microbiology Recent Results (from the past 240 hour(s))  Blood Culture (routine x 2)     Status: Abnormal   Collection Time: 05/02/21 12:00 PM   Specimen: BLOOD  Result Value Ref Range Status   Specimen Description   Final    BLOOD LEFT ANTECUBITAL Performed at [facility], [place] [address] [place], [place] [address]    Special Requests   Final    BOTTLES DRAWN AEROBIC AND ANAEROBIC Blood Culture results may not be optimal due to an inadequate volume of blood received in culture bottles Performed at [place] [address] [place], [place] [address]    Culture  Setup Time   Final    GRAM NEGATIVE RODS IN BOTH AEROBIC AND ANAEROBIC BOTTLES CRITICAL RESULT CALLED TO, READ BACK BY AND VERIFIED WITH: Sanda Klein, AT [phone] 05/03/21 [place] Performed at [facility], [place] [address] [place], [name] [address]    Culture KLEBSIELLA PNEUMONIAE (A)  Final   Report Status 05/05/2021 FINAL  Final   Organism ID, Bacteria KLEBSIELLA PNEUMONIAE  Final      Susceptibility   Klebsiella pneumoniae - MIC*    AMPICILLIN RESISTANT Resistant     CEFAZOLIN <=4 SENSITIVE Sensitive     CEFEPIME <=0.12 SENSITIVE Sensitive     CEFTAZIDIME <=1 SENSITIVE Sensitive     CEFTRIAXONE <=0.25 SENSITIVE Sensitive     CIPROFLOXACIN <=0.25 SENSITIVE Sensitive     GENTAMICIN <=1 SENSITIVE Sensitive     IMIPENEM 0.5 SENSITIVE Sensitive     TRIMETH/SULFA <=20 SENSITIVE Sensitive     AMPICILLIN/SULBACTAM 8 SENSITIVE Sensitive     PIP/TAZO <=4 SENSITIVE Sensitive     * KLEBSIELLA PNEUMONIAE  Blood Culture ID Panel (Reflexed)     Status: Abnormal   Collection Time: 05/02/21 12:00 PM  Result Value Ref Range Status   Enterococcus faecalis NOT DETECTED NOT DETECTED Final   Enterococcus Faecium NOT DETECTED NOT DETECTED Final   Listeria monocytogenes NOT DETECTED NOT DETECTED Final   Staphylococcus species  NOT DETECTED NOT DETECTED Final   Staphylococcus aureus (BCID) NOT DETECTED NOT DETECTED Final   Staphylococcus epidermidis NOT DETECTED NOT DETECTED Final   Staphylococcus lugdunensis NOT DETECTED NOT DETECTED Final   Streptococcus species NOT DETECTED NOT DETECTED Final   Streptococcus agalactiae NOT DETECTED NOT DETECTED Final   Streptococcus pneumoniae NOT DETECTED NOT DETECTED Final   Streptococcus pyogenes NOT DETECTED NOT DETECTED Final   A.calcoaceticus-baumannii NOT DETECTED NOT DETECTED Final   Bacteroides fragilis NOT DETECTED NOT DETECTED Final  Enterobacterales DETECTED (A) NOT DETECTED Final    Comment: Enterobacterales represent a large order of gram negative bacteria, not a single organism. CRITICAL RESULT CALLED TO, READ BACK BY AND VERIFIED WITH: Colin Rhein PHARMD, AT [phone] 05/03/21 D. VANHOOK    Enterobacter cloacae complex NOT DETECTED NOT DETECTED Final   Escherichia coli NOT DETECTED NOT DETECTED Final   Klebsiella aerogenes NOT DETECTED NOT DETECTED Final   Klebsiella oxytoca NOT DETECTED NOT DETECTED Final   Klebsiella pneumoniae DETECTED (A) NOT DETECTED Final    Comment: CRITICAL RESULT CALLED TO, READ BACK BY AND VERIFIED WITH: Colin Rhein PHARMD, AT 0102 05/03/21 D. VANHOOK    Proteus species NOT DETECTED NOT DETECTED Final   Salmonella species NOT DETECTED NOT DETECTED Final   Serratia marcescens NOT DETECTED NOT DETECTED Final   Haemophilus influenzae NOT DETECTED NOT DETECTED Final   Neisseria meningitidis NOT DETECTED NOT DETECTED Final   Pseudomonas aeruginosa NOT DETECTED NOT DETECTED Final   Stenotrophomonas maltophilia NOT DETECTED NOT DETECTED Final   Candida albicans NOT DETECTED NOT DETECTED Final   Candida auris NOT DETECTED NOT DETECTED Final   Candida glabrata NOT DETECTED NOT DETECTED Final   Candida krusei NOT DETECTED NOT DETECTED Final   Candida parapsilosis NOT DETECTED NOT DETECTED Final   Candida tropicalis NOT DETECTED NOT DETECTED Final    Cryptococcus neoformans/gattii NOT DETECTED NOT DETECTED Final   CTX-M ESBL NOT DETECTED NOT DETECTED Final   Carbapenem resistance IMP NOT DETECTED NOT DETECTED Final   Carbapenem resistance KPC NOT DETECTED NOT DETECTED Final   Carbapenem resistance NDM NOT DETECTED NOT DETECTED Final   Carbapenem resist OXA 48 LIKE NOT DETECTED NOT DETECTED Final   Carbapenem resistance VIM NOT DETECTED NOT DETECTED Final    Comment: Performed at [facility], [address], [place], [place] [address]  Blood Culture (routine x 2)     Status: Abnormal   Collection Time: 05/02/21 12:15 PM   Specimen: BLOOD  Result Value Ref Range Status   Specimen Description   Final    BLOOD LEFT ANTECUBITAL Performed at [place] [address] [place], [place] [address]    Special Requests   Final    BOTTLES DRAWN AEROBIC AND ANAEROBIC Blood Culture results may not be optimal due to an inadequate volume of blood received in culture bottles Performed at [place] [address], [place], [place] [address]    Culture  Setup Time   Final    GRAM NEGATIVE RODS IN BOTH AEROBIC AND ANAEROBIC BOTTLES CRITICAL VALUE NOTED.  VALUE IS CONSISTENT WITH PREVIOUSLY REPORTED AND CALLED VALUE.    Culture (A)  Final    KLEBSIELLA PNEUMONIAE SUSCEPTIBILITIES PERFORMED ON PREVIOUS CULTURE WITHIN THE LAST 5 DAYS. Performed at [facility], [place] [address], [place], [place] [address]    Report Status 05/05/2021 FINAL  Final  Resp Panel by RT-PCR (Flu A&B, Covid) Nasopharyngeal Swab     Status: None   Collection Time: 05/02/21 12:16 PM   Specimen: Nasopharyngeal Swab; Nasopharyngeal(NP) swabs in vial transport medium  Result Value Ref Range Status   SARS Coronavirus 2 by RT PCR NEGATIVE NEGATIVE Final    Comment: (NOTE) SARS-CoV-2 target nucleic acids are NOT DETECTED.  The SARS-CoV-2 RNA is generally detectable in upper respiratory specimens during the acute phase  of infection. The lowest concentration of SARS-CoV-2 viral copies this assay can detect is 138 copies/mL. A negative result does not preclude SARS-Cov-2 infection  and should not be used as the sole basis for treatment or other patient management decisions. A negative result may occur with  improper specimen collection/handling, submission of specimen other than nasopharyngeal swab, presence of viral mutation(s) within the areas targeted by this assay, and inadequate number of viral copies(<138 copies/mL). A negative result must be combined with clinical observations, patient history, and epidemiological information. The expected result is Negative.  Fact Sheet for Patients:  EntrepreneurPulse.com.au  Fact Sheet for Healthcare Providers:  IncredibleEmployment.be  This test is no t yet approved or cleared by the [place] FDA and  has been authorized for detection and/or diagnosis of SARS-CoV-2 by FDA under an Emergency Use Authorization (EUA). This EUA will remain  in effect (meaning this test can be used) for the duration of the COVID-19 declaration under Section 564(b)(1) of the Act, 21 U.S.C.section 360bbb-3(b)(1), unless the authorization is terminated  or revoked sooner.       Influenza A by PCR NEGATIVE NEGATIVE Final   Influenza B by PCR NEGATIVE NEGATIVE Final    Comment: (NOTE) The Xpert Xpress SARS-CoV-2/FLU/RSV plus assay is intended as an aid in the diagnosis of influenza from Nasopharyngeal swab specimens and should not be used as a sole basis for treatment. Nasal washings and aspirates are unacceptable for Xpert Xpress SARS-CoV-2/FLU/RSV testing.  Fact Sheet for Patients: EntrepreneurPulse.com.au  Fact Sheet for Healthcare Providers: IncredibleEmployment.be  This test is not yet approved or cleared by the [place] FDA and has been authorized for detection and/or diagnosis of  SARS-CoV-2 by FDA under an Emergency Use Authorization (EUA). This EUA will remain in effect (meaning this test can be used) for the duration of the COVID-19 declaration under Section 564(b)(1) of the Act, 21 U.S.C. section 360bbb-3(b)(1), unless the authorization is terminated or revoked.  Performed at [facility], [place] [address], [place], [place] [address]   Urine Culture     Status: Abnormal   Collection Time: 05/02/21 12:16 PM   Specimen: In/Out Cath Urine  Result Value Ref Range Status   Specimen Description   Final    IN/OUT CATH URINE Performed at [place] [address] [place], [place] [address]    Special Requests   Final    NONE Performed at [facility], [place] [address], [place], [place] [address]    Culture >=100,000 COLONIES/mL KLEBSIELLA PNEUMONIAE (A)  Final   Report Status 05/04/2021 FINAL  Final   Organism ID, Bacteria KLEBSIELLA PNEUMONIAE (A)  Final      Susceptibility   Klebsiella pneumoniae - MIC*    AMPICILLIN RESISTANT Resistant     CEFAZOLIN <=4 SENSITIVE Sensitive     CEFEPIME <=0.12 SENSITIVE Sensitive     CEFTRIAXONE <=0.25 SENSITIVE Sensitive     CIPROFLOXACIN <=0.25 SENSITIVE Sensitive     GENTAMICIN <=1 SENSITIVE Sensitive     IMIPENEM 0.5 SENSITIVE Sensitive     NITROFURANTOIN 64 INTERMEDIATE Intermediate     TRIMETH/SULFA <=20 SENSITIVE Sensitive     AMPICILLIN/SULBACTAM 4 SENSITIVE Sensitive     PIP/TAZO <=4 SENSITIVE Sensitive     * >=100,000 COLONIES/mL KLEBSIELLA PNEUMONIAE  MRSA Next Gen by PCR, Nasal     Status: None   Collection Time: 05/02/21  6:29 PM   Specimen: Nasal Mucosa; Nasal Swab  Result Value Ref Range Status   MRSA by PCR Next Gen NOT DETECTED NOT DETECTED Final    Comment: (NOTE) The GeneXpert MRSA Assay (FDA approved for NASAL specimens only), is  one component of a comprehensive MRSA colonization surveillance program. It is not intended to diagnose MRSA  infection nor to guide or monitor treatment for MRSA infections. Test performance is not FDA approved in patients less than 5 years old. Performed at [facility], [place] [address], [place], [place] [address]      Time coordinating discharge: 35 minutes  SIGNED:   Cordelia Poche, MD [facility] 05/07/2021, 1:42 PM

## 2021-05-07 NOTE — Progress Notes (Signed)
Patient Saturations on Room Air at Rest = 97%  Patient Saturations on [employer] while Ambulating = 92%

## 2021-05-07 NOTE — Assessment & Plan Note (Addendum)
Discussed with neurology. Likely physiologic tremor related to hospitalization and illness. Medications correlating with timing of symptoms reviewed and appears to be unlikely related to medication adverse effect. Recommendation for observation for a few weeks with outpatient neurology follow-up if persists. TSH, Vitamin B12 and B1 pending on discharge.

## 2021-05-08 ENCOUNTER — Telehealth: Payer: Self-pay

## 2021-05-08 NOTE — Telephone Encounter (Signed)
Transition Care Management Follow-up Telephone Call Date of discharge and from where: [facility] 05/07/21 How have you been since you were released from the hospital? [place] Any questions or concerns? No  Items Reviewed: Did the pt receive and understand the discharge instructions provided? Yes  Medications obtained and verified? Yes  Other? No  Any new allergies since your discharge? No  Dietary orders reviewed? No Do you have support at home? Yes   Home Care and Equipment/Supplies: Were home health services ordered? no  Functional Questionnaire: (I = Independent and D = Dependent) ADLs: I  Bathing/Dressing- I  Meal Prep- I  Eating- I  Maintaining continence- I  Transferring/Ambulation- I  Managing Meds- I  Follow up appointments reviewed:  PCP Hospital f/u appt confirmed? Yes  Scheduled to see Dr. Redmond School on 05/14/21 @ 3:45. [facility] f/u appt confirmed? Yes  Scheduled to see Urology on 05/20/21  Are transportation arrangements needed? No  If their condition worsens, is the pt aware to call PCP or go to the Emergency Dept.? Yes Was the patient provided with contact information for the PCP's office or ED? Yes Was to pt encouraged to call back with questions or concerns? Yes

## 2021-05-10 ENCOUNTER — Other Ambulatory Visit: Payer: Self-pay

## 2021-05-10 ENCOUNTER — Telehealth: Payer: Self-pay

## 2021-05-10 DIAGNOSIS — R2689 Other abnormalities of gait and mobility: Secondary | ICD-10-CM

## 2021-05-10 NOTE — Telephone Encounter (Signed)
Pt. Spouse called stating she see's you Wednesday for a hospital f/u. She stated she is in pretty bad shape still gets out of breath and gets very tired just walking to the bathroom. She wanted to know if they could get some home health orders she needs help in the house before her apt. On Wednesday. I told her we might not to be able to order home health until she is seen on Wednesday but I would send you a message. She said just wanted to let you know because the holidays are coming up next week also.

## 2021-05-11 LAB — VITAMIN B1: Vitamin B1 (Thiamine): 138.5 nmol/L (ref 66.5–200.0)

## 2021-05-14 ENCOUNTER — Inpatient Hospital Stay: Payer: PPO | Admitting: Family Medicine

## 2021-05-15 ENCOUNTER — Ambulatory Visit (INDEPENDENT_AMBULATORY_CARE_PROVIDER_SITE_OTHER): Payer: PPO | Admitting: Family Medicine

## 2021-05-15 ENCOUNTER — Other Ambulatory Visit: Payer: Self-pay

## 2021-05-15 ENCOUNTER — Inpatient Hospital Stay: Payer: PPO | Admitting: Family Medicine

## 2021-05-15 VITALS — BP 94/62 | HR 89 | Temp 99.3°F | Wt 152.8 lb

## 2021-05-15 DIAGNOSIS — R251 Tremor, unspecified: Secondary | ICD-10-CM

## 2021-05-15 DIAGNOSIS — N179 Acute kidney failure, unspecified: Secondary | ICD-10-CM

## 2021-05-15 DIAGNOSIS — N139 Obstructive and reflux uropathy, unspecified: Secondary | ICD-10-CM

## 2021-05-15 DIAGNOSIS — N10 Acute tubulo-interstitial nephritis: Secondary | ICD-10-CM | POA: Diagnosis not present

## 2021-05-15 DIAGNOSIS — R7989 Other specified abnormal findings of blood chemistry: Secondary | ICD-10-CM

## 2021-05-15 DIAGNOSIS — A4159 Other Gram-negative sepsis: Secondary | ICD-10-CM

## 2021-05-15 LAB — POCT URINALYSIS DIPSTICK
Bilirubin, UA: NEGATIVE
Glucose, UA: NEGATIVE
Ketones, UA: NEGATIVE
Leukocytes, UA: NEGATIVE
Nitrite, UA: NEGATIVE
Protein, UA: POSITIVE — AB
Spec Grav, UA: 1.02 (ref 1.010–1.025)
Urobilinogen, UA: 0.2 E.U./dL
pH, UA: 6 (ref 5.0–8.0)

## 2021-05-15 NOTE — Progress Notes (Signed)
   Subjective:    Patient ID: [name], female    DOB: Apr 02, 1951, 70 y.o.   MRN: [id number]  HPI She is here for follow-up visit after recent hospitalization for treatment of obstructive uropathy subsequent sepsis, Klebsiella UTI and multiple electrolyte abnormalities including low magnesium.  Also a TSH was slightly elevated along the readings.  She was sent home on pain medications as well as ondansetron for nausea.  Presently she still feels quite weak and is unable to mobilize without help.  No fever, chills and not been taking any pain medications.  Apparently physical therapy was supposed to be handled at home but she has not received any communication from them.   Review of Systems     Objective:   Physical Exam Alert and quite pale and weak appearing.  TMs clear.  Throat is clear.  Neck is supple without adenopathy.  Cardiac and lung exam normal.  Abdominal exam shows no masses or tenderness. The medical record including ER visit, discharge summary, labs, x-rays and urologic procedure notes was reviewed. Urine dipstick is negative      Assessment & Plan:  Sepsis due to Klebsiella ([place]) - Plan: CBC with Differential/Platelet, Comprehensive metabolic panel, Urinalysis Dipstick  Acute pyelonephritis - Plan: Urinalysis Dipstick  Acute kidney injury ([place])  Elevated TSH - Plan: TSH  Obstructive uropathy  Hypomagnesemia - Plan: Magnesium She is scheduled to follow-up with urology next week. I will set up for home health visit to assess needs.  Also recommend she take Prilosec to help because she is still having some slight nausea.

## 2021-05-16 LAB — COMPREHENSIVE METABOLIC PANEL
ALT: 37 IU/L — ABNORMAL HIGH (ref 0–32)
AST: 23 IU/L (ref 0–40)
Albumin/Globulin Ratio: 1.2 (ref 1.2–2.2)
Albumin: 3.7 g/dL — ABNORMAL LOW (ref 3.8–4.8)
Alkaline Phosphatase: 234 IU/L — ABNORMAL HIGH (ref 44–121)
BUN/Creatinine Ratio: 18 (ref 12–28)
BUN: 16 mg/dL (ref 8–27)
Bilirubin Total: <0.2 mg/dL (ref 0.0–1.2)
CO2: 20 mmol/L (ref 20–29)
Calcium: 9.4 mg/dL (ref 8.7–10.3)
Chloride: 102 mmol/L (ref 96–106)
Creatinine, Ser: 0.88 mg/dL (ref 0.57–1.00)
Globulin, Total: 3 g/dL (ref 1.5–4.5)
Glucose: 99 mg/dL (ref 70–99)
Potassium: 5.2 mmol/L (ref 3.5–5.2)
Sodium: 138 mmol/L (ref 134–144)
Total Protein: 6.7 g/dL (ref 6.0–8.5)
eGFR: 72 mL/min/{1.73_m2} (ref >59)

## 2021-05-16 LAB — CBC WITH DIFFERENTIAL/PLATELET
Basophils Absolute: 0.1 10*3/uL (ref 0.0–0.2)
Basos: 1 %
EOS (ABSOLUTE): 0.3 10*3/uL (ref 0.0–0.4)
Eos: 2 %
Hematocrit: 35.1 % (ref 34.0–46.6)
Hemoglobin: 12 g/dL (ref 11.1–15.9)
Immature Grans (Abs): 0.4 10*3/uL — ABNORMAL HIGH (ref 0.0–0.1)
Immature Granulocytes: 4 %
Lymphocytes Absolute: 1.6 10*3/uL (ref 0.7–3.1)
Lymphs: 14 %
MCH: 29.2 pg (ref 26.6–33.0)
MCHC: 34.2 g/dL (ref 31.5–35.7)
MCV: 85 fL (ref 79–97)
Monocytes Absolute: 1.1 10*3/uL — ABNORMAL HIGH (ref 0.1–0.9)
Monocytes: 10 %
Neutrophils Absolute: 7.6 10*3/uL — ABNORMAL HIGH (ref 1.4–7.0)
Neutrophils: 69 %
Platelets: 755 10*3/uL — ABNORMAL HIGH (ref 150–450)
RBC: 4.11 x10E6/uL (ref 3.77–5.28)
RDW: 12.6 % (ref 11.7–15.4)
WBC: 11.1 10*3/uL — ABNORMAL HIGH (ref 3.4–10.8)

## 2021-05-16 LAB — MAGNESIUM: Magnesium: 2 mg/dL (ref 1.6–2.3)

## 2021-05-16 LAB — TSH: TSH: 2.12 u[IU]/mL (ref 0.450–4.500)

## 2021-05-20 DIAGNOSIS — N3020 Other chronic cystitis without hematuria: Secondary | ICD-10-CM | POA: Diagnosis not present

## 2021-05-20 DIAGNOSIS — N201 Calculus of ureter: Secondary | ICD-10-CM | POA: Diagnosis not present

## 2021-05-21 DIAGNOSIS — N3020 Other chronic cystitis without hematuria: Secondary | ICD-10-CM | POA: Diagnosis not present

## 2021-05-22 ENCOUNTER — Ambulatory Visit: Payer: PPO

## 2021-05-22 ENCOUNTER — Encounter: Payer: Self-pay | Admitting: Family Medicine

## 2021-05-23 DIAGNOSIS — R251 Tremor, unspecified: Secondary | ICD-10-CM | POA: Diagnosis not present

## 2021-05-23 DIAGNOSIS — Z791 Long term (current) use of non-steroidal anti-inflammatories (NSAID): Secondary | ICD-10-CM | POA: Diagnosis not present

## 2021-05-23 DIAGNOSIS — N139 Obstructive and reflux uropathy, unspecified: Secondary | ICD-10-CM | POA: Diagnosis not present

## 2021-05-23 DIAGNOSIS — R262 Difficulty in walking, not elsewhere classified: Secondary | ICD-10-CM | POA: Diagnosis not present

## 2021-05-23 DIAGNOSIS — M6281 Muscle weakness (generalized): Secondary | ICD-10-CM | POA: Diagnosis not present

## 2021-05-23 DIAGNOSIS — N16 Renal tubulo-interstitial disorders in diseases classified elsewhere: Secondary | ICD-10-CM | POA: Diagnosis not present

## 2021-05-23 DIAGNOSIS — A4159 Other Gram-negative sepsis: Secondary | ICD-10-CM | POA: Diagnosis not present

## 2021-05-29 DIAGNOSIS — M6281 Muscle weakness (generalized): Secondary | ICD-10-CM | POA: Diagnosis not present

## 2021-05-29 DIAGNOSIS — N139 Obstructive and reflux uropathy, unspecified: Secondary | ICD-10-CM | POA: Diagnosis not present

## 2021-05-29 DIAGNOSIS — N16 Renal tubulo-interstitial disorders in diseases classified elsewhere: Secondary | ICD-10-CM | POA: Diagnosis not present

## 2021-05-29 DIAGNOSIS — Z791 Long term (current) use of non-steroidal anti-inflammatories (NSAID): Secondary | ICD-10-CM | POA: Diagnosis not present

## 2021-05-29 DIAGNOSIS — A4159 Other Gram-negative sepsis: Secondary | ICD-10-CM | POA: Diagnosis not present

## 2021-05-29 DIAGNOSIS — R262 Difficulty in walking, not elsewhere classified: Secondary | ICD-10-CM | POA: Diagnosis not present

## 2021-05-29 DIAGNOSIS — R251 Tremor, unspecified: Secondary | ICD-10-CM | POA: Diagnosis not present

## 2021-06-04 DIAGNOSIS — G43111 Migraine with aura, intractable, with status migrainosus: Secondary | ICD-10-CM | POA: Diagnosis not present

## 2021-06-04 DIAGNOSIS — G43719 Chronic migraine without aura, intractable, without status migrainosus: Secondary | ICD-10-CM | POA: Diagnosis not present

## 2021-06-04 DIAGNOSIS — G43019 Migraine without aura, intractable, without status migrainosus: Secondary | ICD-10-CM | POA: Diagnosis not present

## 2021-06-06 DIAGNOSIS — B9689 Other specified bacterial agents as the cause of diseases classified elsewhere: Secondary | ICD-10-CM | POA: Diagnosis not present

## 2021-06-06 DIAGNOSIS — R251 Tremor, unspecified: Secondary | ICD-10-CM | POA: Diagnosis not present

## 2021-06-06 DIAGNOSIS — D696 Thrombocytopenia, unspecified: Secondary | ICD-10-CM | POA: Diagnosis not present

## 2021-06-06 DIAGNOSIS — E669 Obesity, unspecified: Secondary | ICD-10-CM | POA: Diagnosis not present

## 2021-06-07 DIAGNOSIS — N3020 Other chronic cystitis without hematuria: Secondary | ICD-10-CM | POA: Diagnosis not present

## 2021-06-11 DIAGNOSIS — M6281 Muscle weakness (generalized): Secondary | ICD-10-CM | POA: Diagnosis not present

## 2021-06-11 DIAGNOSIS — Z791 Long term (current) use of non-steroidal anti-inflammatories (NSAID): Secondary | ICD-10-CM | POA: Diagnosis not present

## 2021-06-11 DIAGNOSIS — R262 Difficulty in walking, not elsewhere classified: Secondary | ICD-10-CM | POA: Diagnosis not present

## 2021-06-11 DIAGNOSIS — A4159 Other Gram-negative sepsis: Secondary | ICD-10-CM | POA: Diagnosis not present

## 2021-06-11 DIAGNOSIS — N16 Renal tubulo-interstitial disorders in diseases classified elsewhere: Secondary | ICD-10-CM | POA: Diagnosis not present

## 2021-06-11 DIAGNOSIS — N139 Obstructive and reflux uropathy, unspecified: Secondary | ICD-10-CM | POA: Diagnosis not present

## 2021-06-11 DIAGNOSIS — R251 Tremor, unspecified: Secondary | ICD-10-CM | POA: Diagnosis not present

## 2021-06-18 DIAGNOSIS — N1330 Unspecified hydronephrosis: Secondary | ICD-10-CM | POA: Diagnosis not present

## 2021-06-20 ENCOUNTER — Other Ambulatory Visit: Payer: Self-pay

## 2021-06-20 ENCOUNTER — Ambulatory Visit
Admission: RE | Admit: 2021-06-20 | Discharge: 2021-06-20 | Disposition: A | Discharge status: Home / Self Care | Payer: PPO | Source: Ambulatory Visit | Attending: Family Medicine | Admitting: Family Medicine

## 2021-06-20 DIAGNOSIS — Z1231 Encounter for screening mammogram for malignant neoplasm of breast: Secondary | ICD-10-CM

## 2021-07-07 DIAGNOSIS — E669 Obesity, unspecified: Secondary | ICD-10-CM | POA: Diagnosis not present

## 2021-07-07 DIAGNOSIS — R251 Tremor, unspecified: Secondary | ICD-10-CM | POA: Diagnosis not present

## 2021-07-07 DIAGNOSIS — D696 Thrombocytopenia, unspecified: Secondary | ICD-10-CM | POA: Diagnosis not present

## 2021-07-07 DIAGNOSIS — B9689 Other specified bacterial agents as the cause of diseases classified elsewhere: Secondary | ICD-10-CM | POA: Diagnosis not present

## 2021-08-02 ENCOUNTER — Ambulatory Visit (INDEPENDENT_AMBULATORY_CARE_PROVIDER_SITE_OTHER): Payer: PPO

## 2021-08-02 ENCOUNTER — Other Ambulatory Visit: Payer: Self-pay

## 2021-08-02 ENCOUNTER — Other Ambulatory Visit (INDEPENDENT_AMBULATORY_CARE_PROVIDER_SITE_OTHER): Payer: PPO

## 2021-08-02 ENCOUNTER — Telehealth: Payer: Self-pay

## 2021-08-02 VITALS — BP 122/78 | HR 67 | Temp 97.9°F | Ht 61.0 in | Wt 147.2 lb

## 2021-08-02 DIAGNOSIS — Z0000 Encounter for general adult medical examination without abnormal findings: Secondary | ICD-10-CM

## 2021-08-02 DIAGNOSIS — R399 Unspecified symptoms and signs involving the genitourinary system: Secondary | ICD-10-CM

## 2021-08-02 LAB — POCT URINALYSIS DIP (PROADVANTAGE DEVICE)
Bilirubin, UA: NEGATIVE
Blood, UA: NEGATIVE
Glucose, UA: NEGATIVE mg/dL
Ketones, POC UA: NEGATIVE mg/dL
Nitrite, UA: NEGATIVE
Protein Ur, POC: NEGATIVE mg/dL
Specific Gravity, Urine: 1.02
Urobilinogen, Ur: 0.2
pH, UA: 6 (ref 5.0–8.0)

## 2021-08-02 NOTE — Patient Instructions (Signed)
[name] , Thank you for taking time to come for your Medicare Wellness Visit. I appreciate your ongoing commitment to your health goals. Please review the following plan we discussed and let me know if I can assist you in the future.   Screening recommendations/referrals: Colonoscopy: completed 06/27/2020, due 06/27/2030 Mammogram: completed 06/20/2021, due 06/21/2022 Bone Density: completed 07/13/2017 Recommended yearly ophthalmology/optometry visit for glaucoma screening and checkup Recommended yearly dental visit for hygiene and checkup  Vaccinations: Influenza vaccine: completed 03/07/2021, due next flu season Pneumococcal vaccine: completed 12/22/2019 Tdap vaccine: completed 12/14/2018, due 12/13/2028 Shingles vaccine: completed   Covid-19: 04/09/2021, 10/18/2020, 04/04/2020, 08/24/2019, 07/31/2019  Advanced directives: Please bring a copy of your POA (Power of Attorney) and/or Living Will to your next appointment.   Conditions/risks identified: none  Next appointment: Follow up in one year for your annual wellness visit    Preventive Care 69 Years and Older, Female Preventive care refers to lifestyle choices and visits with your health care provider that can promote health and wellness. What does preventive care include? A yearly physical exam. This is also called an annual well check. Dental exams once or twice a year. Routine eye exams. Ask your health care provider how often you should have your eyes checked. Personal lifestyle choices, including: Daily care of your teeth and gums. Regular physical activity. Eating a healthy diet. Avoiding tobacco and drug use. Limiting alcohol use. Practicing safe sex. Taking low-dose aspirin every day. Taking vitamin and mineral supplements as recommended by your health care provider. What happens during an annual well check? The services and screenings done by your health care provider during your annual well check will depend on your  age, overall health, lifestyle risk factors, and family history of disease. Counseling  Your health care provider may ask you questions about your: Alcohol use. Tobacco use. Drug use. Emotional well-being. Home and relationship well-being. Sexual activity. Eating habits. History of falls. Memory and ability to understand (cognition). Work and work Statistician. Reproductive health. Screening  You may have the following tests or measurements: Height, weight, and BMI. Blood pressure. Lipid and cholesterol levels. These may be checked every 5 years, or more frequently if you are over 1 years old. Skin check. Lung cancer screening. You may have this screening every year starting at age 69 if you have a 30-pack-year history of smoking and currently smoke or have quit within the past 15 years. Fecal occult blood test (FOBT) of the stool. You may have this test every year starting at age 69. Flexible sigmoidoscopy or colonoscopy. You may have a sigmoidoscopy every 5 years or a colonoscopy every 10 years starting at age 69. Hepatitis C blood test. Hepatitis B blood test. Sexually transmitted disease (STD) testing. Diabetes screening. This is done by checking your blood sugar (glucose) after you have not eaten for a while (fasting). You may have this done every 1-3 years. Bone density scan. This is done to screen for osteoporosis. You may have this done starting at age 69. Mammogram. This may be done every 1-2 years. Talk to your health care provider about how often you should have regular mammograms. Talk with your health care provider about your test results, treatment options, and if necessary, the need for more tests. Vaccines  Your health care provider may recommend certain vaccines, such as: Influenza vaccine. This is recommended every year. Tetanus, diphtheria, and acellular pertussis (Tdap, Td) vaccine. You may need a Td booster every 10 years. Zoster vaccine. You may need this  after  age 69. Pneumococcal 13-valent conjugate (PCV13) vaccine. One dose is recommended after age 69. Pneumococcal polysaccharide (PPSV23) vaccine. One dose is recommended after age 69. Talk to your health care provider about which screenings and vaccines you need and how often you need them. This information is not intended to replace advice given to you by your health care provider. Make sure you discuss any questions you have with your health care provider. Document Released: 07/06/2015 Document Revised: 02/27/2016 Document Reviewed: 04/10/2015 Elsevier Interactive Patient Education  2017 [place] Prevention in the Home Falls can cause injuries. They can happen to people of all ages. There are many things you can do to make your home safe and to help prevent falls. What can I do on the outside of my home? Regularly fix the edges of walkways and driveways and fix any cracks. Remove anything that might make you trip as you walk through a door, such as a raised step or threshold. Trim any bushes or trees on the path to your home. Use bright outdoor lighting. Clear any walking paths of anything that might make someone trip, such as rocks or tools. Regularly check to see if handrails are loose or broken. Make sure that both sides of any steps have handrails. Any raised decks and porches should have guardrails on the edges. Have any leaves, snow, or ice cleared regularly. Use sand or salt on walking paths during winter. Clean up any spills in your garage right away. This includes oil or grease spills. What can I do in the bathroom? Use night lights. Install grab bars by the toilet and in the tub and shower. Do not use towel bars as grab bars. Use non-skid mats or decals in the tub or shower. If you need to sit down in the shower, use a plastic, non-slip stool. Keep the floor dry. Clean up any water that spills on the floor as soon as it happens. Remove soap buildup in the tub or shower  regularly. Attach bath mats securely with double-sided non-slip rug tape. Do not have throw rugs and other things on the floor that can make you trip. What can I do in the bedroom? Use night lights. Make sure that you have a light by your bed that is easy to reach. Do not use any sheets or blankets that are too big for your bed. They should not hang down onto the floor. Have a firm chair that has side arms. You can use this for support while you get dressed. Do not have throw rugs and other things on the floor that can make you trip. What can I do in the kitchen? Clean up any spills right away. Avoid walking on wet floors. Keep items that you use a lot in easy-to-reach places. If you need to reach something above you, use a strong step stool that has a grab bar. Keep electrical cords out of the way. Do not use floor polish or wax that makes floors slippery. If you must use wax, use non-skid floor wax. Do not have throw rugs and other things on the floor that can make you trip. What can I do with my stairs? Do not leave any items on the stairs. Make sure that there are handrails on both sides of the stairs and use them. Fix handrails that are broken or loose. Make sure that handrails are as long as the stairways. Check any carpeting to make sure that it is firmly attached to the  stairs. Fix any carpet that is loose or worn. Avoid having throw rugs at the top or bottom of the stairs. If you do have throw rugs, attach them to the floor with carpet tape. Make sure that you have a light switch at the top of the stairs and the bottom of the stairs. If you do not have them, ask someone to add them for you. What else can I do to help prevent falls? Wear shoes that: Do not have high heels. Have rubber bottoms. Are comfortable and fit you well. Are closed at the toe. Do not wear sandals. If you use a stepladder: Make sure that it is fully opened. Do not climb a closed stepladder. Make sure that  both sides of the stepladder are locked into place. Ask someone to hold it for you, if possible. Clearly mark and make sure that you can see: Any grab bars or handrails. First and last steps. Where the edge of each step is. Use tools that help you move around (mobility aids) if they are needed. These include: Canes. Walkers. Scooters. Crutches. Turn on the lights when you go into a dark area. Replace any light bulbs as soon as they burn out. Set up your furniture so you have a clear path. Avoid moving your furniture around. If any of your floors are uneven, fix them. If there are any pets around you, be aware of where they are. Review your medicines with your doctor. Some medicines can make you feel dizzy. This can increase your chance of falling. Ask your doctor what other things that you can do to help prevent falls. This information is not intended to replace advice given to you by your health care provider. Make sure you discuss any questions you have with your health care provider. Document Released: 04/05/2009 Document Revised: 11/15/2015 Document Reviewed: 07/14/2014 Elsevier Interactive Patient Education  2017 [employer].

## 2021-08-02 NOTE — Progress Notes (Signed)
This visit occurred during the SARS-CoV-2 public health emergency.  Safety protocols were in place, including screening questions prior to the visit, additional usage of staff PPE, and extensive cleaning of exam room while observing appropriate contact time as indicated for disinfecting solutions.  Subjective:   Carol [name] is a 71 y.o. female who presents for Medicare Annual (Subsequent) preventive examination.  Review of Systems     Cardiac Risk Factors include: advanced age (>42men, >73 women)     Objective:    Today's Vitals   08/02/21 1030  BP: 122/78  Pulse: 67  Temp: 97.9 F (36.6 C)  TempSrc: Oral  SpO2: 99%  Weight: 147 lb 3.2 oz (66.8 kg)  Height: 5\' 1"  (1.549 m)   Body mass index is 27.81 kg/m.  Advanced Directives 08/02/2021 05/02/2021 05/02/2021 08/23/2020 01/19/2015 01/12/2015 10/19/2014  Does Patient Have a Medical Advance Directive? Yes Yes Yes Yes Yes Yes Yes  Type of Paramedic of [place];Living will [place];Living will [place];Living will [place];Living will Living will;Healthcare Power of [place];Living will Living will;Healthcare Power of Attorney  Does patient want to make changes to medical advance directive? - No - Patient declined No - Patient declined No - Patient declined No - Patient declined No - Patient declined -  Copy of [place] in Chart? No - copy requested No - copy requested No - copy requested No - copy requested Yes Yes Yes  Pre-existing out of facility DNR order (yellow form or pink MOST form) - - - - - - -    Current Medications (verified) Outpatient Encounter Medications as of 08/02/2021  Medication Sig   albuterol (PROAIR HFA) 108 (90 Base) MCG/ACT inhaler INHALE 2 PUFFS INTO THE LUNGS EVERY 6 HOURS AS NEEDED FOR WHEEZING/ SHORTNESS OF BREATH. (Patient taking differently: Inhale 2 puffs into the  lungs every 6 (six) hours as needed for wheezing or shortness of breath.)   albuterol (PROVENTIL) (2.5 MG/3ML) 0.083% nebulizer solution Take 3 mLs (2.5 mg total) by nebulization every 4 (four) hours as needed for wheezing or shortness of breath (while at home).   atorvastatin (LIPITOR) 20 MG tablet Take 1 tablet (20 mg total) by mouth daily.   diclofenac sodium (VOLTAREN) 1 % GEL Apply 2 g topically 4 (four) times daily.   methocarbamol (ROBAXIN) 500 MG tablet Take 500 mg by mouth daily as needed (migraine).   pantoprazole (PROTONIX) 40 MG tablet Take 1 tablet (40 mg total) by mouth daily.   tamsulosin (FLOMAX) 0.4 MG CAPS capsule Take 0.4 mg by mouth at bedtime.   valACYclovir (VALTREX) 500 MG tablet Take 1 tablet (500 mg total) by mouth daily. (Patient taking differently: Take 500 mg by mouth daily as needed (flare up).)   zonisamide (ZONEGRAN) 100 MG capsule Take 100 mg by mouth 2 (two) times daily.   beclomethasone (QVAR REDIHALER) 40 MCG/ACT inhaler Inhale 2 puffs into the lungs 2 (two) times daily. (Patient not taking: Reported on 05/15/2021)   HYDROcodone-acetaminophen (NORCO/VICODIN) 5-325 MG tablet Take 1-2 tablets by mouth every 6 (six) hours as needed for pain. (Patient not taking: Reported on 08/02/2021)   ketorolac (TORADOL) 10 MG tablet Take 10 mg by mouth every 8 (eight) hours as needed for pain. (Patient not taking: Reported on 08/02/2021)   ondansetron (ZOFRAN) 4 MG tablet Take 4 mg by mouth every 8 (eight) hours as needed for nausea/vomiting. (Patient not taking: Reported on  08/02/2021)   No facility-administered encounter medications on file as of 08/02/2021.    Allergies (verified) Iohexol, Ivp dye [iodinated contrast media], Codeine, and Onion   History: Past Medical History:  Diagnosis Date   Abnormal Pap smear of cervix    in 20's   Anxiety    claustrophobic   Arthritis    "neck, hands" (09/02/2013)   Basal cell carcinoma of forehead 2008; 2014   Dizziness last  occurance 10-15-2014   on occasion, not related to activity   DVT (deep venous thrombosis) ([place]) 06/28/2014   Dr [employer], right leg, behind knee   Environmental allergies    Exercise-induced asthma 30's   GERD (gastroesophageal reflux disease)    H/O hiatal hernia    Herpes simplex 1976   History of kidney stones    Hyperlipidemia    Migraine teens, returned in 71"s   "depends on the change in the weather; last one was 1 wk ago" (09/02/2013)   Plantar fasciitis    PONV (postoperative nausea and vomiting) 3/ 2015   ponv after gallblaffer   Right ureteral stone    Seasonal allergic rhinitis    Tubular adenoma of colon 05/22/2017   per path report.    Tubular adenoma of colon 06/29/2020   Past Surgical History:  Procedure Laterality Date   [place]   CHOLECYSTECTOMY N/A 09/02/2013   Procedure: LAPAROSCOPIC CHOLECYSTECTOMY;  Surgeon: Harl Bowie, MD;  Location: [facility];  Service: General;  Laterality: N/A;   CRYOTHERAPY     for abnormal pap smear in 20's   CYSTOSCOPY W/ URETERAL STENT PLACEMENT  03/05/2012   Procedure: CYSTOSCOPY WITH RETROGRADE PYELOGRAM/URETERAL STENT PLACEMENT;  Surgeon: Hanley Ben, MD;  Location: [facility];  Service: Urology;  Laterality: Right;   CYSTOSCOPY W/ URETERAL STENT PLACEMENT Right 01/19/2015   Procedure: CYSTOSCOPY WITH RETROGRADE PYELOGRAM/URETERAL  RIGHT STENT PLACEMENT;  Surgeon: Alexis Frock, MD;  Location: [facility];  Service: Urology;  Laterality: Right;   CYSTOSCOPY WITH RETROGRADE PYELOGRAM, URETEROSCOPY AND STENT PLACEMENT Right 09/08/2014   Procedure: CYSTOSCOPY WITH RIGHT  RETROGRADE PYELOGRAM, URETEROSCOPY AND RIGHT STENT PLACEMENT;  Surgeon: Raynelle Bring, MD;  Location: [facility];  Service: Urology;  Laterality: Right;   CYSTOSCOPY WITH RETROGRADE PYELOGRAM, URETEROSCOPY AND STENT PLACEMENT Right 10/19/2014   Procedure: CYSTOSCOPY WITH Bilateral RETROGRADE PYELOGRAM/RIGHT  URETEROSCOPY,RIGHT JJ STENT;  Surgeon: Carolan Clines, MD;  Location[facility];  Service: Urology;  Laterality: Right;   CYSTOSCOPY/RETROGRADE/URETEROSCOPY  11/18/2011   Procedure: CYSTOSCOPY/RETROGRADE/URETEROSCOPY;  Surgeon: Ailene Rud, MD;  [facility];  Service: Urology;  Laterality: Right;   CYSTOSCOPY/RETROGRADE/URETEROSCOPY/STONE EXTRACTION WITH BASKET  10/21/2011   Procedure: CYSTOSCOPY/RETROGRADE/URETEROSCOPY/STONE EXTRACTION WITH BASKET;  Surgeon: Ailene Rud, MD;  [facility];  Service: Urology;  Laterality: Right;   CYSTOSCOPY/RETROGRADE/URETEROSCOPY/STONE EXTRACTION WITH BASKET Right 09/10/2012   Procedure: RIGHT URETEROSCOPY/STONE EXTRACTION/LASER LITHOTRIPSY/POSSIBLE DOUBLE J STENT PLACEMENT;  Surgeon: Ailene Rud, MD;  [facility];  Service: Urology;  Laterality: Right;   CYSTOSCOPY/URETEROSCOPY/HOLMIUM LASER/STENT PLACEMENT Left 05/02/2021   Procedure: CYSTOSCOPY/URETEROSCOPY/HOLMIUM LASER/STENT PLACEMENT;  Surgeon: Festus Aloe, MD;  [facility];  Service: Urology;  Laterality: Left;   MOHS SURGERY  2008; 2014   PLANTAR FASCIA SURGERY  05/2016   POSTERIOR PINNACLE MESH SACROSPINOUS REPAIR  11-22-2010   RECURRENT ENTEROCELE   RIGHT URETEROSCOPIC STONE EXTRACTION W/ STENT PLACEMENT  08-12-2011   x2 stents   ROBOTICALLY ASSISTED LAPAROSCOPIC URETERAL RE-IMPLANTATION Right 01/19/2015  Procedure: ROBOTICALLY ASSISTED LAPAROSCOPIC URETERAL RE-IMPLANTATION RIGHT;  Surgeon: Alexis Frock, MD;  [facility];  Service: Urology;  Laterality: Right;   TUBAL LIGATION  1991   VAULT SUSPENSION (DERMAL GRAFT)/ ENTEROCELE AND RECTOCELE REPAIR  12-05-2005   VAULT PROLAPSE/ ENTERCELE/ RECTOCELE   Family History  Problem Relation Age of Onset   Cancer Father    Social History   Socioeconomic History   Marital status: Married    Spouse name: Not on file   Number of children: Not on file   Years of education: Not  on file   Highest education level: Not on file  Occupational History   Not on file  Tobacco Use   Smoking status: Never   Smokeless tobacco: Never  Vaping Use   Vaping Use: Never used  Substance and Sexual Activity   Alcohol use: Not Currently   Drug use: No   Sexual activity: Yes    Partners: Female    Birth control/protection: Surgical    Comment: hysterectomy  Other Topics Concern   Not on file  Social History Narrative   Not on file   Social Determinants of Health   Financial Resource Strain: Low Risk    Difficulty of Paying Living Expenses: Not hard at all  Food Insecurity: No Food Insecurity   Worried About Charity fundraiser in the Last Year: Never true   [place] in the Last Year: Never true  Transportation Needs: No Transportation Needs   Lack of Transportation (Medical): No   Lack of Transportation (Non-Medical): No  Physical Activity: Sufficiently Active   Days of Exercise per Week: 4 days   Minutes of Exercise per Session: 40 min  Stress: No Stress Concern Present   Feeling of Stress : Not at all  Social Connections: Not on file    Tobacco Counseling Counseling given: Not Answered   Clinical Intake:  Pre-visit preparation completed: Yes  Pain : No/denies pain     Nutritional Status: BMI 25 -29 Overweight Nutritional Risks: None Diabetes: No  How often do you need to have someone help you when you read instructions, pamphlets, or other written materials from your doctor or pharmacy?: 1 - Never What is the last grade level you completed in school?: GED  Diabetic? no  Interpreter Needed?: No  Information entered by :: NAllen LPN   Activities of Daily Living In your present state of health, do you have any difficulty performing the following activities: 08/02/2021 07/31/2021  Hearing? N N  Vision? N N  Difficulty concentrating or making decisions? N N  Walking or climbing stairs? N N  Dressing or bathing? N N  Comment - -  Doing  errands, shopping? N N  Preparing Food and eating ? N N  Using the Toilet? N N  In the past six months, have you accidently leaked urine? Y Y  Do you have problems with loss of bowel control? N N  Managing your Medications? N N  Managing your Finances? N N  Housekeeping or managing your Housekeeping? N N  Some recent data might be hidden    Patient Care Team: Denita Lung, MD as PCP - General (Family Medicine)  Indicate any recent Medical Services you may have received from other than [facility] providers in the past year (date may be approximate).     Assessment:   This is a routine wellness examination for Darbie.  Hearing/Vision screen No results found.  Dietary issues and exercise activities discussed:  Current Exercise Habits: Home exercise routine, Type of exercise: walking;strength training/weights, Time (Minutes): 40, Frequency (Times/Week): 4, Weekly Exercise (Minutes/Week): 160   Goals Addressed             This Visit's Progress    Patient Stated       08/02/2021, build stamina back up       Depression Screen PHQ 2/9 Scores 08/02/2021 08/23/2020 05/24/2020 03/16/2017 10/15/2015  PHQ - 2 Score 0 0 0 0 0    Fall Risk Fall Risk  08/02/2021 07/31/2021 08/23/2020 01/17/2020 03/16/2017  Falls in the past year? 1 1 0 0 Yes  Comment passed out - - Emmi Telephone Survey: data to providers prior to load -  Number falls in past yr: 0 0 0 - 1  Injury with Fall? 1 1 0 - No  Risk for fall due to : Medication side effect - No Fall Risks - -  Follow up Falls evaluation completed;Education provided;Falls prevention discussed - Falls evaluation completed - -    FALL RISK PREVENTION PERTAINING TO THE HOME:  Any stairs in or around the home? Yes  If so, are there any without handrails? No  Home free of loose throw rugs in walkways, pet beds, electrical cords, etc? Yes  Adequate lighting in your home to reduce risk of falls? Yes   ASSISTIVE DEVICES UTILIZED TO PREVENT FALLS:  Life  alert? No  Use of a cane, walker or w/c? No  Grab bars in the bathroom? Yes  Shower chair or bench in shower? No  Elevated toilet seat or a handicapped toilet? Yes   TIMED UP AND GO:  Was the test performed? No .     Gait steady and fast without use of assistive device  Cognitive Function:     6CIT Screen 08/02/2021  What Year? 0 points  What month? 0 points  What time? 0 points  Count back from 20 0 points  Months in reverse 0 points  Repeat phrase 0 points  Total Score 0    Immunizations Immunization History  Administered Date(s) Administered   Fluad Quad(high Dose 65+) 03/13/2019, 02/28/2020, 03/07/2021   Influenza Whole 04/19/2007, 09/04/2008   Influenza-Unspecified 03/23/2013, 03/14/2016, 04/12/2017, 03/13/2019   PFIZER Comirnaty(Gray Top)Covid-19 Tri-Sucrose Vaccine 10/18/2020   PFIZER(Purple Top)SARS-COV-2 Vaccination 07/31/2019, 08/24/2019, 04/04/2020   Pfizer Covid-19 Vaccine Bivalent Booster 79yrs & up 04/09/2021   Pneumococcal Conjugate-13 12/14/2018   Pneumococcal Polysaccharide-23 12/22/2019   Td 12/13/2001   Tdap 06/23/2008, 12/14/2018   Zoster Recombinat (Shingrix) 06/17/2017, 08/24/2017   Zoster, Live 06/01/2015    TDAP status: Up to date  Flu Vaccine status: Up to date  Pneumococcal vaccine status: Up to date  Covid-19 vaccine status: Completed vaccines  Qualifies for Shingles Vaccine? Yes   Zostavax completed Yes   Shingrix Completed?: Yes  Screening Tests Health Maintenance  Topic Date Due   MAMMOGRAM  06/20/2022   TETANUS/TDAP  12/13/2028   COLONOSCOPY (Pts 45-23yrs Insurance coverage will need to be confirmed)  06/27/2030   Pneumonia Vaccine 62+ Years old  Completed   INFLUENZA VACCINE  Completed   DEXA SCAN  Completed   COVID-19 Vaccine  Completed   Hepatitis C Screening  Completed   Zoster Vaccines- Shingrix  Completed   HPV VACCINES  Aged Out    Health Maintenance  There are no preventive care reminders to display for this  patient.  Colorectal cancer screening: Type of screening: Colonoscopy. Completed 06/27/2020. Repeat every 10 years  Mammogram status: Completed 06/20/2021. Repeat  every year  Bone Density status: Completed 07/13/2017.  Lung Cancer Screening: (Low Dose CT Chest recommended if Age 107-80 years, 30 pack-year currently smoking OR have quit w/in 15years.) does not qualify.   Lung Cancer Screening Referral: no  Additional Screening:  Hepatitis C Screening: does qualify; Completed 05/15/2015  Vision Screening: Recommended annual ophthalmology exams for early detection of glaucoma and other disorders of the eye. Is the patient up to date with their annual eye exam?  Yes  Who is the provider or what is the name of the office in which the patient attends annual eye exams? Dr. Sabra Heck If pt is not established with a provider, would they like to be referred to a provider to establish care? No .   Dental Screening: Recommended annual dental exams for proper oral hygiene  Community Resource Referral / Chronic Care Management: CRR required this visit?  No   CCM required this visit?  No      Plan:     I have personally reviewed and noted the following in the patients chart:   Medical and social history Use of alcohol, tobacco or illicit drugs  Current medications and supplements including opioid prescriptions.  Functional ability and status Nutritional status Physical activity Advanced directives List of other physicians Hospitalizations, surgeries, and ER visits in previous 12 months Vitals Screenings to include cognitive, depression, and falls Referrals and appointments  In addition, I have reviewed and discussed with patient certain preventive protocols, quality metrics, and best practice recommendations. A written personalized care plan for preventive services as well as general preventive health recommendations were provided to patient.     Kellie Simmering, LPN   10/24/5463    Nurse Notes: believes she has an UTI. Providing an urine specimen.

## 2021-08-02 NOTE — Telephone Encounter (Signed)
Pt came in for awv and had symptoms of UTI. U/A was done and she has trace leu. Please advise for pt call. [place]

## 2021-08-02 NOTE — Telephone Encounter (Signed)
Pt was advised [facility] 

## 2021-08-07 DIAGNOSIS — R251 Tremor, unspecified: Secondary | ICD-10-CM | POA: Diagnosis not present

## 2021-08-07 DIAGNOSIS — D696 Thrombocytopenia, unspecified: Secondary | ICD-10-CM | POA: Diagnosis not present

## 2021-08-07 DIAGNOSIS — B9689 Other specified bacterial agents as the cause of diseases classified elsewhere: Secondary | ICD-10-CM | POA: Diagnosis not present

## 2021-08-07 DIAGNOSIS — E669 Obesity, unspecified: Secondary | ICD-10-CM | POA: Diagnosis not present

## 2021-08-14 ENCOUNTER — Other Ambulatory Visit: Payer: Self-pay

## 2021-08-14 ENCOUNTER — Encounter (HOSPITAL_BASED_OUTPATIENT_CLINIC_OR_DEPARTMENT_OTHER): Payer: Self-pay | Admitting: Obstetrics & Gynecology

## 2021-08-14 ENCOUNTER — Ambulatory Visit (HOSPITAL_BASED_OUTPATIENT_CLINIC_OR_DEPARTMENT_OTHER): Payer: PPO | Admitting: Obstetrics & Gynecology

## 2021-08-14 VITALS — BP 123/70 | HR 82 | Ht 60.0 in | Wt 147.2 lb

## 2021-08-14 DIAGNOSIS — Z780 Asymptomatic menopausal state: Secondary | ICD-10-CM | POA: Diagnosis not present

## 2021-08-14 DIAGNOSIS — B009 Herpesviral infection, unspecified: Secondary | ICD-10-CM | POA: Diagnosis not present

## 2021-08-14 DIAGNOSIS — N951 Menopausal and female climacteric states: Secondary | ICD-10-CM | POA: Diagnosis not present

## 2021-08-14 DIAGNOSIS — R3915 Urgency of urination: Secondary | ICD-10-CM

## 2021-08-14 DIAGNOSIS — M8580 Other specified disorders of bone density and structure, unspecified site: Secondary | ICD-10-CM

## 2021-08-14 DIAGNOSIS — Z8619 Personal history of other infectious and parasitic diseases: Secondary | ICD-10-CM | POA: Diagnosis not present

## 2021-08-14 DIAGNOSIS — R300 Dysuria: Secondary | ICD-10-CM | POA: Diagnosis not present

## 2021-08-14 DIAGNOSIS — Z90710 Acquired absence of both cervix and uterus: Secondary | ICD-10-CM | POA: Diagnosis not present

## 2021-08-14 DIAGNOSIS — Z86718 Personal history of other venous thrombosis and embolism: Secondary | ICD-10-CM

## 2021-08-14 LAB — POCT URINALYSIS DIPSTICK
Bilirubin, UA: NEGATIVE
Blood, UA: NEGATIVE
Glucose, UA: NEGATIVE
Ketones, UA: NEGATIVE
Nitrite, UA: NEGATIVE
Protein, UA: NEGATIVE
Spec Grav, UA: 1.02 (ref 1.010–1.025)
Urobilinogen, UA: 0.2 E.U./dL
pH, UA: 7 (ref 5.0–8.0)

## 2021-08-14 MED ORDER — TRIMETHOPRIM 100 MG PO TABS: 100.0000 mg | ORAL_TABLET | Freq: Two times a day (BID) | ORAL | Status: DC

## 2021-08-14 MED ORDER — NITROFURANTOIN MONOHYD MACRO 100 MG PO CAPS: 100.0000 mg | ORAL_CAPSULE | Freq: Two times a day (BID) | ORAL | 0 refills | Status: DC

## 2021-08-14 MED ORDER — VALACYCLOVIR HCL 500 MG PO TABS: ORAL_TABLET | ORAL | 3 refills | Status: DC

## 2021-08-14 NOTE — Patient Instructions (Signed)
Paroxetine (Paxil) 10mg  is typically the dosage I start.  Gabapentin 100mg  dosage

## 2021-08-14 NOTE — Progress Notes (Signed)
71 y.o. G12P3003 Married White or Caucasian female here for new patient exam.  PMP.  Denies vaginal bleeding.  H/o TAH done in her 30's due to uterine prolapse.  In 2007, she did have a vault suspension x 2.  The first procedure lasted 2 years  and it was redone.  Second procedure has been much more successful.    She thinks she still has her ovaries.    H/O DVT while on estrogen patch.  W/u was negative.  Now no longer on anticoagulation.    H/o kidney stones but last fall has stone that was just at [facility].   She had urosepsis with this as well and elevated WBC ct.  Had lithotripsy.  Has been on trimethoprim since that time.  Reports she's been feeling increased urinary frequency with dysuria as well.    Patient's last menstrual period was 06/23/1990 (approximate).          Sexually active: Yes.    H/O STD:  yes  Health Maintenance: PCP:  Dr. Redmond School.  Last wellness appt was 08/23/2020.  Did blood work at that appt: yes Vaccines are up to date:  yes Colonoscopy:  06/27/2020 MMG:  06/20/2021 Negative BMD:  07/13/2017 Osteopenia Last pap smear:  07/11/2019 Negative.   H/o abnormal pap smear:  1981 with negative follow up   reports that she has never smoked. She has never used smokeless tobacco. She reports that she does not currently use alcohol. She reports that she does not use drugs.  Past Medical History:  Diagnosis Date   Abnormal Pap smear of cervix    in 20's   Anxiety    claustrophobic   Arthritis    "neck, hands" (09/02/2013)   Basal cell carcinoma of forehead 2008; 2014   Dizziness last occurance 10-15-2014   on occasion, not related to activity   DVT (deep venous thrombosis) ([place]) 06/28/2014   Dr Redmond School, right leg, behind knee   Environmental allergies    Exercise-induced asthma 30's   GERD (gastroesophageal reflux disease)    H/O hiatal hernia    Herpes simplex 1976   History of kidney stones    Hyperlipidemia    Migraine teens, returned in 46"s   "depends on the change  in the weather; last one was 1 wk ago" (09/02/2013)   Plantar fasciitis    PONV (postoperative nausea and vomiting) 3/ 2015   ponv after gallblaffer   Right ureteral stone    Seasonal allergic rhinitis    Tubular adenoma of colon 05/22/2017   per path report.    Tubular adenoma of colon 06/29/2020    Past Surgical History:  Procedure Laterality Date   [place]   CHOLECYSTECTOMY N/A 09/02/2013   Procedure: LAPAROSCOPIC CHOLECYSTECTOMY;  Surgeon: Harl Bowie, MD;  Location: [facility];  Service: General;  Laterality: N/A;   CRYOTHERAPY     for abnormal pap smear in 20's   CYSTOSCOPY W/ URETERAL STENT PLACEMENT  03/05/2012   Procedure: CYSTOSCOPY WITH RETROGRADE PYELOGRAM/URETERAL STENT PLACEMENT;  Surgeon: Hanley Ben, MD;  Location: [facility];  Service: Urology;  Laterality: Right;   CYSTOSCOPY W/ URETERAL STENT PLACEMENT Right 01/19/2015   Procedure: CYSTOSCOPY WITH RETROGRADE PYELOGRAM/URETERAL  RIGHT STENT PLACEMENT;  Surgeon: Alexis Frock, MD;  Location: [facility];  Service: Urology;  Laterality: Right;   CYSTOSCOPY WITH RETROGRADE PYELOGRAM, URETEROSCOPY AND STENT PLACEMENT Right 09/08/2014   Procedure: CYSTOSCOPY WITH RIGHT  RETROGRADE PYELOGRAM, URETEROSCOPY  AND RIGHT STENT PLACEMENT;  Surgeon: Raynelle Bring, MD;  Location: [facility];  Service: Urology;  Laterality: Right;   CYSTOSCOPY WITH RETROGRADE PYELOGRAM, URETEROSCOPY AND STENT PLACEMENT Right 10/19/2014   Procedure: CYSTOSCOPY WITH Bilateral RETROGRADE PYELOGRAM/RIGHT URETEROSCOPY,RIGHT JJ STENT;  Surgeon: Carolan Clines, MD;  Location: [facility];  Service: Urology;  Laterality: Right;   CYSTOSCOPY/RETROGRADE/URETEROSCOPY  11/18/2011   Procedure: CYSTOSCOPY/RETROGRADE/URETEROSCOPY;  Surgeon: Ailene Rud, MD;  Location[facility];  Service: Urology;  Laterality: Right;   CYSTOSCOPY/RETROGRADE/URETEROSCOPY/STONE EXTRACTION WITH BASKET  10/21/2011   Procedure:  CYSTOSCOPY/RETROGRADE/URETEROSCOPY/STONE EXTRACTION WITH BASKET;  Surgeon: Ailene Rud, MD;  Location: [facility];  Service: Urology;  Laterality: Right;   CYSTOSCOPY/RETROGRADE/URETEROSCOPY/STONE EXTRACTION WITH BASKET Right 09/10/2012   Procedure: RIGHT URETEROSCOPY/STONE EXTRACTION/LASER LITHOTRIPSY/POSSIBLE DOUBLE J STENT PLACEMENT;  Surgeon: Ailene Rud, MD;  Location[facility];  Service: Urology;  Laterality: Right;   CYSTOSCOPY/URETEROSCOPY/HOLMIUM LASER/STENT PLACEMENT Left 05/02/2021   Procedure: CYSTOSCOPY/URETEROSCOPY/HOLMIUM LASER/STENT PLACEMENT;  Surgeon: Festus Aloe, MD;  Location[facility];  Service: Urology;  Laterality: Left;   MOHS SURGERY  2008; 2014   PLANTAR FASCIA SURGERY  05/2016   POSTERIOR PINNACLE MESH SACROSPINOUS REPAIR  11-22-2010   RECURRENT ENTEROCELE   RIGHT URETEROSCOPIC STONE EXTRACTION W/ STENT PLACEMENT  08-12-2011   x2 stents   ROBOTICALLY ASSISTED LAPAROSCOPIC URETERAL RE-IMPLANTATION Right 01/19/2015   Procedure: ROBOTICALLY ASSISTED LAPAROSCOPIC URETERAL RE-IMPLANTATION RIGHT;  Surgeon: Alexis Frock, MD;  Location[facility];  Service: Urology;  Laterality: Right;   TUBAL LIGATION  1991   VAULT SUSPENSION (DERMAL GRAFT)/ ENTEROCELE AND RECTOCELE REPAIR  12-05-2005   VAULT PROLAPSE/ ENTERCELE/ RECTOCELE    Current Outpatient Medications  Medication Sig Dispense Refill   albuterol (PROAIR HFA) 108 (90 Base) MCG/ACT inhaler INHALE 2 PUFFS INTO THE LUNGS EVERY 6 HOURS AS NEEDED FOR WHEEZING/ SHORTNESS OF BREATH. (Patient taking differently: Inhale 2 puffs into the lungs every 6 (six) hours as needed for wheezing or shortness of breath.) 8.5 g 0   albuterol (PROVENTIL) (2.5 MG/3ML) 0.083% nebulizer solution Take 3 mLs (2.5 mg total) by nebulization every 4 (four) hours as needed for wheezing or shortness of breath (while at home). 75 mL 0   atorvastatin (LIPITOR) 20 MG tablet Take 1 tablet (20 mg total) by mouth daily. 90  tablet 3   beclomethasone (QVAR REDIHALER) 40 MCG/ACT inhaler Inhale 2 puffs into the lungs 2 (two) times daily. 10.6 g 3   diclofenac sodium (VOLTAREN) 1 % GEL Apply 2 g topically 4 (four) times daily.     HYDROcodone-acetaminophen (NORCO/VICODIN) 5-325 MG tablet Take 1-2 tablets by mouth every 6 (six) hours as needed for pain.     ketorolac (TORADOL) 10 MG tablet Take 10 mg by mouth every 8 (eight) hours as needed for pain.     methocarbamol (ROBAXIN) 500 MG tablet Take 500 mg by mouth daily as needed (migraine).     nitrofurantoin, macrocrystal-monohydrate, (MACROBID) 100 MG capsule Take 1 capsule (100 mg total) by mouth 2 (two) times daily. 10 capsule 0   ondansetron (ZOFRAN) 4 MG tablet Take 4 mg by mouth every 8 (eight) hours as needed for nausea/vomiting.     pantoprazole (PROTONIX) 40 MG tablet Take 1 tablet (40 mg total) by mouth daily. 90 tablet 3   tamsulosin (FLOMAX) 0.4 MG CAPS capsule Take 0.4 mg by mouth at bedtime.     trimethoprim (TRIMPEX) 100 MG tablet Take 1 tablet (100 mg total) by mouth 2 (two) times daily.     zonisamide (  ZONEGRAN) 100 MG capsule Take 100 mg by mouth 2 (two) times daily.     valACYclovir (VALTREX) 500 MG tablet 1 tablet bid x 3 days with new onset of symptoms 30 tablet 3   No current facility-administered medications for this visit.    Family History  Problem Relation Age of Onset   Cancer Father        bladder cancer    Review of Systems  All other systems reviewed and are negative.  Exam:   BP 123/70 (BP Location: Left Arm, Patient Position: Sitting, Cuff Size: Large)    Pulse 82    Ht 5' (1.524 m) Comment: reported   Wt 147 lb 3.2 oz (66.8 kg)    LMP 06/23/1990 (Approximate)    BMI 28.75 kg/m   Height: 5' (152.4 cm) (reported)  General appearance: alert, cooperative and appears stated age Breasts: normal appearance, no masses or tenderness Abdomen: soft, non-tender; bowel sounds normal; no masses,  no organomegaly Lymph nodes: Cervical,  supraclavicular, and axillary nodes normal.  No abnormal inguinal nodes palpated Neurologic: Grossly normal  Pelvic: External genitalia:  no lesions              Urethra:  normal appearing urethra with no masses, tenderness or lesions              Bartholins and Skenes: normal                 Vagina: normal appearing vagina with atrophic changes and no discharge, no lesions              Cervix: surgically absent              Pap taken: No. Bimanual Exam:  Uterus:  uterus absent              Adnexa: no mass, fullness, tenderness               Rectovaginal: Confirms               Anus:  normal sphincter tone, no lesions  Chaperone, Octaviano Batty, CMA, was present for exam.  Assessment/Plan: 1. Postmenopausal - no HRT  2. Urgency of urination and dysuria - POCT Urinalysis Dipstick - Urine Culture - with hx will start macrobid 100mg  bid x 5 days  3.  HSV-2 infection - valACYclovir (VALTREX) 500 MG tablet; 1 tablet bid x 3 days with new onset of symptoms  Dispense: 30 tablet; Refill: 3  4. Osteopenia, unspecified location - DG BONE DENSITY (DXA); Future  5. History of hysterectomy  6. History of DVT of lower extremity

## 2021-08-16 ENCOUNTER — Encounter (HOSPITAL_BASED_OUTPATIENT_CLINIC_OR_DEPARTMENT_OTHER): Payer: Self-pay | Admitting: Obstetrics & Gynecology

## 2021-08-16 LAB — URINE CULTURE

## 2021-09-04 DIAGNOSIS — B9689 Other specified bacterial agents as the cause of diseases classified elsewhere: Secondary | ICD-10-CM | POA: Diagnosis not present

## 2021-09-04 DIAGNOSIS — D696 Thrombocytopenia, unspecified: Secondary | ICD-10-CM | POA: Diagnosis not present

## 2021-09-04 DIAGNOSIS — E669 Obesity, unspecified: Secondary | ICD-10-CM | POA: Diagnosis not present

## 2021-09-04 DIAGNOSIS — R251 Tremor, unspecified: Secondary | ICD-10-CM | POA: Diagnosis not present

## 2021-09-17 DIAGNOSIS — R34 Anuria and oliguria: Secondary | ICD-10-CM | POA: Diagnosis not present

## 2021-09-17 DIAGNOSIS — N3020 Other chronic cystitis without hematuria: Secondary | ICD-10-CM | POA: Diagnosis not present

## 2021-09-17 DIAGNOSIS — N202 Calculus of kidney with calculus of ureter: Secondary | ICD-10-CM | POA: Diagnosis not present

## 2021-09-23 ENCOUNTER — Other Ambulatory Visit: Payer: Self-pay | Admitting: Family Medicine

## 2021-09-23 DIAGNOSIS — K219 Gastro-esophageal reflux disease without esophagitis: Secondary | ICD-10-CM

## 2021-10-05 DIAGNOSIS — D696 Thrombocytopenia, unspecified: Secondary | ICD-10-CM | POA: Diagnosis not present

## 2021-10-05 DIAGNOSIS — B9689 Other specified bacterial agents as the cause of diseases classified elsewhere: Secondary | ICD-10-CM | POA: Diagnosis not present

## 2021-10-05 DIAGNOSIS — E669 Obesity, unspecified: Secondary | ICD-10-CM | POA: Diagnosis not present

## 2021-10-05 DIAGNOSIS — R251 Tremor, unspecified: Secondary | ICD-10-CM | POA: Diagnosis not present

## 2021-10-20 ENCOUNTER — Other Ambulatory Visit: Payer: Self-pay | Admitting: Family Medicine

## 2021-10-20 DIAGNOSIS — E785 Hyperlipidemia, unspecified: Secondary | ICD-10-CM

## 2021-11-04 DIAGNOSIS — R251 Tremor, unspecified: Secondary | ICD-10-CM | POA: Diagnosis not present

## 2021-11-04 DIAGNOSIS — E669 Obesity, unspecified: Secondary | ICD-10-CM | POA: Diagnosis not present

## 2021-11-04 DIAGNOSIS — B9689 Other specified bacterial agents as the cause of diseases classified elsewhere: Secondary | ICD-10-CM | POA: Diagnosis not present

## 2021-11-04 DIAGNOSIS — D696 Thrombocytopenia, unspecified: Secondary | ICD-10-CM | POA: Diagnosis not present

## 2021-11-12 ENCOUNTER — Other Ambulatory Visit: Payer: Self-pay | Admitting: Family Medicine

## 2021-11-12 DIAGNOSIS — B009 Herpesviral infection, unspecified: Secondary | ICD-10-CM

## 2021-11-12 NOTE — Telephone Encounter (Signed)
[place] is requesting to fill pt valtrex. Please advise [facility]

## 2021-11-21 DIAGNOSIS — G43719 Chronic migraine without aura, intractable, without status migrainosus: Secondary | ICD-10-CM | POA: Diagnosis not present

## 2021-12-05 DIAGNOSIS — D696 Thrombocytopenia, unspecified: Secondary | ICD-10-CM | POA: Diagnosis not present

## 2021-12-05 DIAGNOSIS — R251 Tremor, unspecified: Secondary | ICD-10-CM | POA: Diagnosis not present

## 2021-12-05 DIAGNOSIS — E669 Obesity, unspecified: Secondary | ICD-10-CM | POA: Diagnosis not present

## 2021-12-05 DIAGNOSIS — B9689 Other specified bacterial agents as the cause of diseases classified elsewhere: Secondary | ICD-10-CM | POA: Diagnosis not present

## 2021-12-11 DIAGNOSIS — M79641 Pain in right hand: Secondary | ICD-10-CM | POA: Diagnosis not present

## 2021-12-11 DIAGNOSIS — M13842 Other specified arthritis, left hand: Secondary | ICD-10-CM | POA: Diagnosis not present

## 2021-12-11 DIAGNOSIS — M79642 Pain in left hand: Secondary | ICD-10-CM | POA: Diagnosis not present

## 2021-12-11 DIAGNOSIS — M13841 Other specified arthritis, right hand: Secondary | ICD-10-CM | POA: Diagnosis not present

## 2021-12-20 DIAGNOSIS — M79645 Pain in left finger(s): Secondary | ICD-10-CM | POA: Diagnosis not present

## 2021-12-20 DIAGNOSIS — M79644 Pain in right finger(s): Secondary | ICD-10-CM | POA: Diagnosis not present

## 2021-12-23 DIAGNOSIS — M79645 Pain in left finger(s): Secondary | ICD-10-CM | POA: Diagnosis not present

## 2021-12-23 DIAGNOSIS — M79644 Pain in right finger(s): Secondary | ICD-10-CM | POA: Diagnosis not present

## 2022-01-07 ENCOUNTER — Encounter (HOSPITAL_BASED_OUTPATIENT_CLINIC_OR_DEPARTMENT_OTHER): Payer: Self-pay

## 2022-01-07 ENCOUNTER — Emergency Department (HOSPITAL_BASED_OUTPATIENT_CLINIC_OR_DEPARTMENT_OTHER)
Admission: EM | Admit: 2022-01-07 | Discharge: 2022-01-07 | Disposition: A | Discharge status: Home / Self Care | Payer: PPO | Attending: Emergency Medicine | Admitting: Emergency Medicine

## 2022-01-07 ENCOUNTER — Emergency Department (HOSPITAL_BASED_OUTPATIENT_CLINIC_OR_DEPARTMENT_OTHER): Payer: PPO | Admitting: Radiology

## 2022-01-07 ENCOUNTER — Other Ambulatory Visit: Payer: Self-pay

## 2022-01-07 ENCOUNTER — Emergency Department (HOSPITAL_BASED_OUTPATIENT_CLINIC_OR_DEPARTMENT_OTHER): Payer: PPO

## 2022-01-07 DIAGNOSIS — S8261XA Displaced fracture of lateral malleolus of right fibula, initial encounter for closed fracture: Secondary | ICD-10-CM | POA: Insufficient documentation

## 2022-01-07 DIAGNOSIS — W19XXXA Unspecified fall, initial encounter: Secondary | ICD-10-CM | POA: Diagnosis not present

## 2022-01-07 DIAGNOSIS — Z85828 Personal history of other malignant neoplasm of skin: Secondary | ICD-10-CM | POA: Diagnosis not present

## 2022-01-07 DIAGNOSIS — S52502A Unspecified fracture of the lower end of left radius, initial encounter for closed fracture: Secondary | ICD-10-CM

## 2022-01-07 DIAGNOSIS — S6992XA Unspecified injury of left wrist, hand and finger(s), initial encounter: Secondary | ICD-10-CM | POA: Diagnosis present

## 2022-01-07 DIAGNOSIS — M25522 Pain in left elbow: Secondary | ICD-10-CM | POA: Diagnosis not present

## 2022-01-07 DIAGNOSIS — J45909 Unspecified asthma, uncomplicated: Secondary | ICD-10-CM | POA: Diagnosis not present

## 2022-01-07 DIAGNOSIS — S82891A Other fracture of right lower leg, initial encounter for closed fracture: Secondary | ICD-10-CM

## 2022-01-07 DIAGNOSIS — S0990XA Unspecified injury of head, initial encounter: Secondary | ICD-10-CM

## 2022-01-07 DIAGNOSIS — S52501A Unspecified fracture of the lower end of right radius, initial encounter for closed fracture: Secondary | ICD-10-CM | POA: Diagnosis not present

## 2022-01-07 DIAGNOSIS — M25561 Pain in right knee: Secondary | ICD-10-CM | POA: Diagnosis not present

## 2022-01-07 DIAGNOSIS — Z043 Encounter for examination and observation following other accident: Secondary | ICD-10-CM | POA: Diagnosis not present

## 2022-01-07 MED ORDER — ONDANSETRON HCL 4 MG/2ML IJ SOLN
4.0000 mg | Freq: Once | INTRAMUSCULAR | Status: AC
  Administered 2022-01-07: 4 mg via INTRAVENOUS
  Filled 2022-01-07: qty 2

## 2022-01-07 MED ORDER — OXYCODONE-ACETAMINOPHEN 5-325 MG PO TABS
1.0000 | ORAL_TABLET | Freq: Once | ORAL | Status: AC
  Administered 2022-01-07: 1 via ORAL
  Filled 2022-01-07: qty 1

## 2022-01-07 MED ORDER — SENNOSIDES-DOCUSATE SODIUM 8.6-50 MG PO TABS: 1.0000 | ORAL_TABLET | Freq: Every evening | ORAL | 0 refills | Status: DC | PRN

## 2022-01-07 MED ORDER — FENTANYL CITRATE PF 50 MCG/ML IJ SOSY
25.0000 ug | PREFILLED_SYRINGE | Freq: Once | INTRAMUSCULAR | Status: AC
  Administered 2022-01-07: 25 ug via INTRAVENOUS
  Filled 2022-01-07: qty 1

## 2022-01-07 MED ORDER — OXYCODONE-ACETAMINOPHEN 5-325 MG PO TABS: 1.0000 | ORAL_TABLET | Freq: Four times a day (QID) | ORAL | 0 refills | Status: DC | PRN

## 2022-01-07 NOTE — Progress Notes (Signed)
Asked to evaluate after slipping on mat and falling to floor in group ex room; arrived to find her on the floor with legs elevated in chair; color pink, alert and oriented, able to voice how she fell and where she was hurting; was having some dizziness initially after fall. BP palp R arm 150/, HR reg; complains of pain left outer wrist, ice pack applied; also pain R ankle with ice applied; after talking with her, she denies any more dizziness, assisted to sit up and eventually to chair; friend is with her; advised to take to [facility] ER for evaluation of wrist and ankle to r/o fractures; assisted by wheelchair to car for transport.

## 2022-01-07 NOTE — Discharge Instructions (Signed)
You were seen in the emergency room today after a fall.  You sustained a fracture to the left wrist and a small fracture to the right ankle.  I have placed you in a cam walker boot and you can put partial weight on this ankle and please follow-up with orthopedics.  I have also placed you in a splint in the left wrist.  Since you have seen [facility] in the past, I have given you the contact information.  Please call for a follow-up appointment in the next 7 to 10 days.  Keep the wrist elevated above the level of your heart while at rest to help reduce swelling and discomfort.  You may also take Tylenol or ibuprofen as needed for pain unless you have been told in the past not to take these.  Percocet does have a small amount of Tylenol already included in this medication and so you should account for that when taking extra.

## 2022-01-07 NOTE — ED Triage Notes (Signed)
Pt. States she was standing on mat this am and the mat came out form under her feet and she fell on right side, states that right foot is hurt and left arm is hurting. States she did hit her head but did not lose consciousness. States she felt real dizzy and light headed. States her arm is 10/10, states foot is 7/10 pain.

## 2022-01-07 NOTE — ED Provider Notes (Signed)
Emergency Department Provider Note   I have reviewed the triage vital signs and the nursing notes.   HISTORY  Chief Complaint Fall   HPI Carol [name] is a 71 y.o. female past history reviewed below presents emergency department for evaluation after mechanical fall.  Patient was getting ready for a workout class at the [facility] when she leaned over to hug a friend and the mat underneath her slipped.  He states that the right ankle rolled and caused her to fall to the ground landing on her left arm.  She did strike her head but did not lose consciousness.  She has been feeling somewhat lightheaded since. No vomiting. No severe HA but notes a mild one.  She is not having pain in the chest or abdomen.  No discomfort in the neck or back.  Her left arm hurts mainly from the elbow to the wrist.  No shoulder discomfort noted.  She has no pain in the hips or knees bilaterally but is mainly having pain to the outside of the right ankle.    Past Medical History:  Diagnosis Date   Abnormal Pap smear of cervix    in 20's   Anxiety    claustrophobic   Arthritis    "neck, hands" (09/02/2013)   Basal cell carcinoma of forehead 2008; 2014   Dizziness last occurance 10-15-2014   on occasion, not related to activity   DVT (deep venous thrombosis) ([place]) 06/28/2014   Dr Redmond School, right leg, behind knee   Environmental allergies    Exercise-induced asthma 30's   GERD (gastroesophageal reflux disease)    H/O hiatal hernia    Herpes simplex 1976   History of kidney stones    Hyperlipidemia    Migraine teens, returned in 65"s   "depends on the change in the weather; last one was 1 wk ago" (09/02/2013)   Plantar fasciitis    PONV (postoperative nausea and vomiting) 3/ 2015   ponv after gallblaffer   Right ureteral stone    Seasonal allergic rhinitis    Tubular adenoma of colon 05/22/2017   per path report.    Tubular adenoma of colon 06/29/2020    Review of Systems  Constitutional: No  fever/chills Eyes: No visual changes. ENT: No sore throat. Cardiovascular: Denies chest pain. Respiratory: Denies shortness of breath. Gastrointestinal: No abdominal pain.  No nausea, no vomiting.  No diarrhea.  No constipation. Genitourinary: Negative for dysuria. Musculoskeletal: Positive right ankle and left forearm pain.  Skin: Negative for rash. Neurological: Negative for focal weakness or numbness. Positive HA.    ____________________________________________   PHYSICAL EXAM:  VITAL SIGNS: Vitals:   01/07/22 1234 01/07/22 1314  BP: 102/85   Pulse: 77   Resp: 18   Temp:  98.3 F (36.8 C)  SpO2: 98%     Constitutional: Alert and oriented. Well appearing and in no acute distress. Eyes: Conjunctivae are normal. PERRL. EOMI. Head: Atraumatic. Nose: No congestion/rhinnorhea. Mouth/Throat: Mucous membranes are moist.   Neck: No stridor.  No cervical spine tenderness to palpation. Cardiovascular: Normal rate, regular rhythm. Good peripheral circulation. Grossly normal heart sounds.   Respiratory: Normal respiratory effort.  No retractions. Lungs CTAB. Gastrointestinal: Soft and nontender. No distention.  Musculoskeletal: No tenderness to the left shoulder.  Joint appears well located clinically.  Tenderness to palpation diffusely from the left elbow to the wrist.  No laceration or ecchymosis.  No clear deformity.  The extremity is neurovascularly intact.  Normal range of motion of  the bilateral hips and knees.  Tenderness mainly to the right lateral ankle without significant swelling.  The lower extremities are similarly neurovascularly intact Neurologic:  Normal speech and language. No gross focal neurologic deficits are appreciated.  Skin:  Skin is warm, dry and intact. No rash noted.  ____________________________________________  RADIOLOGY  DG Ankle Complete Right  Result Date: 01/07/2022 CLINICAL DATA:  [place] this morning.  Right ankle pain. EXAM: RIGHT ANKLE - COMPLETE  3+ VIEW COMPARISON:  None Available. FINDINGS: The ankle mortise is maintained. There is a small avulsion fracture noted at the distal tip of the lateral malleolus. Associated adjacent soft tissue swelling is noted. The tibia and talus are intact. No definite ankle joint effusion. The visualized mid and hindfoot bony structures are intact. Small calcaneal heel spur noted incidentally. IMPRESSION: Small avulsion type fracture involving the distal tip of the lateral malleolus. Electronically Signed   By: Marijo Sanes M.D.   On: 01/07/2022 12:03   DG Knee Complete 4 Views Right  Result Date: 01/07/2022 CLINICAL DATA:  [place].  Right knee pain. EXAM: RIGHT KNEE - COMPLETE 4+ VIEW COMPARISON:  None Available. FINDINGS: The joint spaces are maintained. No acute fracture is identified. No knee joint effusion. IMPRESSION: No acute bony findings. Electronically Signed   By: Marijo Sanes M.D.   On: 01/07/2022 12:01   DG Wrist Complete Left  Result Date: 01/07/2022 CLINICAL DATA:  [place] this morning.  Left wrist pain. EXAM: LEFT WRIST - COMPLETE 3+ VIEW COMPARISON:  None FINDINGS: There is a nondisplaced but mildly dorsally impacted fracture of the distal radius. I do not see an obvious intra-articular component. The ulna is intact. The carpal and metacarpal bones are intact. IMPRESSION: Nondisplaced but mildly impacted distal radius fracture. Electronically Signed   By: Marijo Sanes M.D.   On: 01/07/2022 12:00   DG Elbow Complete Left  Result Date: 01/07/2022 CLINICAL DATA:  [place] this morning.  Left elbow pain. EXAM: LEFT ELBOW - COMPLETE 3+ VIEW COMPARISON:  None Available. FINDINGS: The joint spaces are maintained. No acute elbow fracture is identified. No elbow joint effusion. IMPRESSION: No acute bony findings. Electronically Signed   By: Marijo Sanes M.D.   On: 01/07/2022 11:58    ____________________________________________   PROCEDURES  Procedure(s) performed:    Procedures  None ____________________________________________   INITIAL IMPRESSION / ASSESSMENT AND PLAN / ED COURSE  Pertinent labs & imaging results that were available during my care of the patient were reviewed by me and considered in my medical decision making (see chart for details).   This patient is Presenting for Evaluation of fall, which does require a range of treatment options, and is a complaint that involves a high risk of morbidity and mortality.  The Differential Diagnoses includes subdural hematoma, epidural hematoma, acute concussion, traumatic subarachnoid hemorrhage, cerebral contusions, etc.   Critical Interventions-    Medications  ondansetron (ZOFRAN) injection 4 mg (4 mg Intravenous Given 01/07/22 1058)  fentaNYL (SUBLIMAZE) injection 25 mcg (25 mcg Intravenous Given 01/07/22 1059)  oxyCODONE-acetaminophen (PERCOCET/ROXICET) 5-325 MG per tablet 1 tablet (1 tablet Oral Given 01/07/22 1233)    Reassessment after intervention: Symptoms improved.    I did obtain Additional Historical Information from friend at bedside.   Clinical Laboratory Tests: Considered CBC, chemistry, troponin but patient's fall by history seems very mechanical.  Defer lab imaging for now.  Radiologic Tests Ordered, included CT head and c-spine along with plain films of the LUE and RLE. I independently interpreted the  images and agree with radiology interpretation.   Social Determinants of Health Risk No smoking history.   Medical Decision Making: Summary:  Patient presents to the emergency department for evaluation of pain after a mechanical fall.  Plan for imaging as above.  Do not see indication for emergent syncope/near syncope work-up.  Reevaluation with update and discussion with patient and friend at bedside.  She is tolerating Percocet well and pain is well controlled.  After splinting and cam walker boot her pain is reduced.  She has good peripheral circulation and normal  sensation on reassessment.  She is ambulatory in the ED without significant difficulty.  She prefers to follow with [facility], as she has seen them in the past, and their contact information was given at discharge.  Discussed pain management strategy along with need for elevation and ice of the affected extremities at home while at rest to reduce swelling.  Considered admission but fractures are nondisplaced and pain is well controlled.  She has assistance at home and plans to follow-up with orthopedist as an outpatient.  Disposition: discharge  ____________________________________________  FINAL CLINICAL IMPRESSION(S) / ED DIAGNOSES  Final diagnoses:  Closed fracture of distal end of left radius, unspecified fracture morphology, initial encounter  Closed avulsion fracture of right ankle, initial encounter  Injury of head, initial encounter  Fall, initial encounter     NEW OUTPATIENT MEDICATIONS STARTED DURING THIS VISIT:  New Prescriptions   OXYCODONE-ACETAMINOPHEN (PERCOCET/ROXICET) 5-325 MG TABLET    Take 1 tablet by mouth every 6 (six) hours as needed for severe pain.   SENNA-DOCUSATE (SENOKOT-S) 8.6-50 MG TABLET    Take 1 tablet by mouth at bedtime as needed for mild constipation or moderate constipation.    Note:  This document was prepared using Dragon voice recognition software and may include unintentional dictation errors.  Nanda Quinton, MD, [facility] Emergency Medicine    Kacee Koren, [facility], MD 01/07/22 1318

## 2022-01-08 ENCOUNTER — Telehealth: Payer: Self-pay

## 2022-01-08 NOTE — Telephone Encounter (Signed)
Pt was discharged yesterday and now is nauseated . She is requesting something to take to help. Pt is to follow up with [facility] and is awaiting a call to schedule. Please advise [facility]

## 2022-01-08 NOTE — Telephone Encounter (Signed)
Pt was advised [facility] 

## 2022-01-08 NOTE — Telephone Encounter (Signed)
Transition Care Management Follow-up Telephone Call Date of discharge and from where: 01/07/22 How have you been since you were released from the hospital? Per pt she has been ok  Any questions or concerns? No  Items Reviewed: Did the pt receive and understand the discharge instructions provided? Yes  Medications obtained and verified? Yes  Other? No  Any new allergies since your discharge? No  Dietary orders reviewed? No Do you have support at home? Yes   Follow up appointments reviewed:  PCP Hospital f/u appt confirmed?  [facility] will follow up  Specialist Hospital f/u appt confirmed? No   Are transportation arrangements needed? No  If their condition worsens, is the pt aware to call PCP or go to the Emergency Dept.? No Was the patient provided with contact information for the PCP's office or ED? Yes Was to pt encouraged to call back with questions or concerns? Yes

## 2022-01-13 DIAGNOSIS — M25571 Pain in right ankle and joints of right foot: Secondary | ICD-10-CM | POA: Diagnosis not present

## 2022-01-13 DIAGNOSIS — M25532 Pain in left wrist: Secondary | ICD-10-CM | POA: Diagnosis not present

## 2022-01-20 DIAGNOSIS — M25532 Pain in left wrist: Secondary | ICD-10-CM | POA: Diagnosis not present

## 2022-01-20 DIAGNOSIS — M25571 Pain in right ankle and joints of right foot: Secondary | ICD-10-CM | POA: Diagnosis not present

## 2022-01-30 ENCOUNTER — Other Ambulatory Visit: Payer: PPO

## 2022-02-03 DIAGNOSIS — M25532 Pain in left wrist: Secondary | ICD-10-CM | POA: Diagnosis not present

## 2022-02-03 DIAGNOSIS — M25571 Pain in right ankle and joints of right foot: Secondary | ICD-10-CM | POA: Diagnosis not present

## 2022-02-12 ENCOUNTER — Encounter: Payer: Self-pay | Admitting: *Deleted

## 2022-02-12 DIAGNOSIS — S62101A Fracture of unspecified carpal bone, right wrist, initial encounter for closed fracture: Secondary | ICD-10-CM

## 2022-02-12 NOTE — Progress Notes (Signed)
[facility] Quality Team Note  Name: Carol [name] Date of Birth: Apr 09, 1951 MRN: [id number] Date: 02/12/2022  [facility] Quality Team has reviewed this patient's chart, please see recommendations below:  Dexa Scan   (Pt has not completed dexa scan.  Fracture date: 01/07/2022, [facility] deadline: 07/06/2022.  Can you see if pt can complete by deadline so we can close the gap?)

## 2022-02-13 NOTE — Addendum Note (Signed)
Addended by: Denita Lung on: 02/13/2022 08:37 AM   Modules accepted: Orders

## 2022-02-13 NOTE — Addendum Note (Signed)
Addended by: Denita Lung on: 02/13/2022 08:40 AM   Modules accepted: Orders

## 2022-02-17 DIAGNOSIS — M25532 Pain in left wrist: Secondary | ICD-10-CM | POA: Diagnosis not present

## 2022-02-17 DIAGNOSIS — M25571 Pain in right ankle and joints of right foot: Secondary | ICD-10-CM | POA: Diagnosis not present

## 2022-02-20 DIAGNOSIS — M25632 Stiffness of left wrist, not elsewhere classified: Secondary | ICD-10-CM | POA: Diagnosis not present

## 2022-02-25 DIAGNOSIS — M25632 Stiffness of left wrist, not elsewhere classified: Secondary | ICD-10-CM | POA: Diagnosis not present

## 2022-02-26 ENCOUNTER — Encounter: Payer: Self-pay | Admitting: Internal Medicine

## 2022-02-28 DIAGNOSIS — M25632 Stiffness of left wrist, not elsewhere classified: Secondary | ICD-10-CM | POA: Diagnosis not present

## 2022-03-03 DIAGNOSIS — M25632 Stiffness of left wrist, not elsewhere classified: Secondary | ICD-10-CM | POA: Diagnosis not present

## 2022-03-04 ENCOUNTER — Other Ambulatory Visit (INDEPENDENT_AMBULATORY_CARE_PROVIDER_SITE_OTHER): Payer: PPO

## 2022-03-04 DIAGNOSIS — Z23 Encounter for immunization: Secondary | ICD-10-CM

## 2022-03-10 DIAGNOSIS — M25632 Stiffness of left wrist, not elsewhere classified: Secondary | ICD-10-CM | POA: Diagnosis not present

## 2022-03-10 DIAGNOSIS — M25532 Pain in left wrist: Secondary | ICD-10-CM | POA: Diagnosis not present

## 2022-03-10 DIAGNOSIS — M25571 Pain in right ankle and joints of right foot: Secondary | ICD-10-CM | POA: Diagnosis not present

## 2022-03-13 DIAGNOSIS — M25632 Stiffness of left wrist, not elsewhere classified: Secondary | ICD-10-CM | POA: Diagnosis not present

## 2022-03-17 DIAGNOSIS — M25632 Stiffness of left wrist, not elsewhere classified: Secondary | ICD-10-CM | POA: Diagnosis not present

## 2022-03-19 NOTE — Progress Notes (Addendum)
[facility] was able to move up appointment to 03/24/2022 at 8:00.  Pt aware. [facility] needs Dr. Sabra Heck to [facility] order in computer. Notified Dr. Ammie Ferrier office about changes with date and time of bone density.

## 2022-03-20 DIAGNOSIS — M25632 Stiffness of left wrist, not elsewhere classified: Secondary | ICD-10-CM | POA: Diagnosis not present

## 2022-03-24 ENCOUNTER — Ambulatory Visit
Admission: RE | Admit: 2022-03-24 | Discharge: 2022-03-24 | Disposition: A | Discharge status: Home / Self Care | Payer: PPO | Source: Ambulatory Visit | Attending: Obstetrics & Gynecology | Admitting: Obstetrics & Gynecology

## 2022-03-24 DIAGNOSIS — M8589 Other specified disorders of bone density and structure, multiple sites: Secondary | ICD-10-CM | POA: Diagnosis not present

## 2022-03-24 DIAGNOSIS — M810 Age-related osteoporosis without current pathological fracture: Secondary | ICD-10-CM | POA: Diagnosis not present

## 2022-03-24 DIAGNOSIS — M8580 Other specified disorders of bone density and structure, unspecified site: Secondary | ICD-10-CM

## 2022-03-24 DIAGNOSIS — Z780 Asymptomatic menopausal state: Secondary | ICD-10-CM | POA: Diagnosis not present

## 2022-03-24 DIAGNOSIS — M25632 Stiffness of left wrist, not elsewhere classified: Secondary | ICD-10-CM | POA: Diagnosis not present

## 2022-03-26 DIAGNOSIS — G5602 Carpal tunnel syndrome, left upper limb: Secondary | ICD-10-CM | POA: Diagnosis not present

## 2022-03-27 DIAGNOSIS — M25632 Stiffness of left wrist, not elsewhere classified: Secondary | ICD-10-CM | POA: Diagnosis not present

## 2022-04-01 ENCOUNTER — Encounter: Payer: Self-pay | Admitting: Internal Medicine

## 2022-04-07 DIAGNOSIS — M25632 Stiffness of left wrist, not elsewhere classified: Secondary | ICD-10-CM | POA: Diagnosis not present

## 2022-04-08 DIAGNOSIS — X32XXXD Exposure to sunlight, subsequent encounter: Secondary | ICD-10-CM | POA: Diagnosis not present

## 2022-04-08 DIAGNOSIS — D225 Melanocytic nevi of trunk: Secondary | ICD-10-CM | POA: Diagnosis not present

## 2022-04-08 DIAGNOSIS — B078 Other viral warts: Secondary | ICD-10-CM | POA: Diagnosis not present

## 2022-04-08 DIAGNOSIS — Z1283 Encounter for screening for malignant neoplasm of skin: Secondary | ICD-10-CM | POA: Diagnosis not present

## 2022-04-08 DIAGNOSIS — D485 Neoplasm of uncertain behavior of skin: Secondary | ICD-10-CM | POA: Diagnosis not present

## 2022-04-08 DIAGNOSIS — L570 Actinic keratosis: Secondary | ICD-10-CM | POA: Diagnosis not present

## 2022-04-09 DIAGNOSIS — H2513 Age-related nuclear cataract, bilateral: Secondary | ICD-10-CM | POA: Diagnosis not present

## 2022-04-09 DIAGNOSIS — H43813 Vitreous degeneration, bilateral: Secondary | ICD-10-CM | POA: Diagnosis not present

## 2022-04-09 DIAGNOSIS — D3132 Benign neoplasm of left choroid: Secondary | ICD-10-CM | POA: Diagnosis not present

## 2022-04-10 DIAGNOSIS — M25632 Stiffness of left wrist, not elsewhere classified: Secondary | ICD-10-CM | POA: Diagnosis not present

## 2022-04-11 ENCOUNTER — Other Ambulatory Visit (INDEPENDENT_AMBULATORY_CARE_PROVIDER_SITE_OTHER): Payer: PPO

## 2022-04-11 DIAGNOSIS — Z23 Encounter for immunization: Secondary | ICD-10-CM

## 2022-04-14 ENCOUNTER — Encounter: Payer: Self-pay | Admitting: Internal Medicine

## 2022-04-16 DIAGNOSIS — G5602 Carpal tunnel syndrome, left upper limb: Secondary | ICD-10-CM | POA: Diagnosis not present

## 2022-04-23 ENCOUNTER — Other Ambulatory Visit: Payer: Self-pay | Admitting: Family Medicine

## 2022-04-23 ENCOUNTER — Encounter: Payer: Self-pay | Admitting: Family Medicine

## 2022-04-23 ENCOUNTER — Ambulatory Visit (INDEPENDENT_AMBULATORY_CARE_PROVIDER_SITE_OTHER): Payer: PPO | Admitting: Family Medicine

## 2022-04-23 VITALS — BP 104/64 | HR 83 | Wt 159.0 lb

## 2022-04-23 DIAGNOSIS — R251 Tremor, unspecified: Secondary | ICD-10-CM

## 2022-04-23 DIAGNOSIS — Z0000 Encounter for general adult medical examination without abnormal findings: Secondary | ICD-10-CM

## 2022-04-23 DIAGNOSIS — M810 Age-related osteoporosis without current pathological fracture: Secondary | ICD-10-CM

## 2022-04-23 DIAGNOSIS — E669 Obesity, unspecified: Secondary | ICD-10-CM | POA: Diagnosis not present

## 2022-04-23 DIAGNOSIS — Z86718 Personal history of other venous thrombosis and embolism: Secondary | ICD-10-CM

## 2022-04-23 DIAGNOSIS — N951 Menopausal and female climacteric states: Secondary | ICD-10-CM

## 2022-04-23 DIAGNOSIS — J4530 Mild persistent asthma, uncomplicated: Secondary | ICD-10-CM

## 2022-04-23 DIAGNOSIS — Z86010 Personal history of colonic polyps: Secondary | ICD-10-CM | POA: Diagnosis not present

## 2022-04-23 DIAGNOSIS — E785 Hyperlipidemia, unspecified: Secondary | ICD-10-CM | POA: Diagnosis not present

## 2022-04-23 DIAGNOSIS — B009 Herpesviral infection, unspecified: Secondary | ICD-10-CM

## 2022-04-23 DIAGNOSIS — K219 Gastro-esophageal reflux disease without esophagitis: Secondary | ICD-10-CM | POA: Diagnosis not present

## 2022-04-23 MED ORDER — ATORVASTATIN CALCIUM 20 MG PO TABS: 20.0000 mg | ORAL_TABLET | Freq: Every day | ORAL | 3 refills | Status: DC

## 2022-04-23 MED ORDER — PANTOPRAZOLE SODIUM 40 MG PO TBEC: 40.0000 mg | DELAYED_RELEASE_TABLET | Freq: Every day | ORAL | 3 refills | Status: DC

## 2022-04-23 MED ORDER — IBANDRONATE SODIUM 150 MG PO TABS: 150.0000 mg | ORAL_TABLET | ORAL | 3 refills | Status: DC

## 2022-04-23 MED ORDER — ALBUTEROL SULFATE HFA 108 (90 BASE) MCG/ACT IN AERS
INHALATION_SPRAY | RESPIRATORY_TRACT | 0 refills | Status: DC
Start: 2022-04-23 — End: 2023-07-23

## 2022-04-23 MED ORDER — VALACYCLOVIR HCL 500 MG PO TABS: ORAL_TABLET | ORAL | 3 refills | Status: DC

## 2022-04-23 NOTE — Progress Notes (Signed)
   Subjective:    Patient ID: Carol [name], female    DOB: 11-25-1950, 71 y.o.   MRN: [id number]  HPI She is here for complete examination.  She recently had a DEXA scan done which did show evidence of osteoporosis.  She has not had any fractures.  She had a colonoscopy in January 2023 which did show adenomatous polyp.  She is scheduled for a 5-year follow-up on that.  Her reflux is under good control on the Protonix.  She continues on Lipitor and having no difficulty with that.  She would also like a refill on her Valtrex.  She does have hot flashes but apparently they are not causing a great deal of difficulty.  She also occasionally has nighttime muscle cramping again not causing a great deal of trouble.  She has a tremor but does not interfere with her ADLs.  Her home life is stable.  She is now retired and enjoying it.  Otherwise her family and social history as well as health maintenance immunizations was reviewed.   Review of Systems  All other systems reviewed and are negative.      Objective:   Physical Exam Alert and in no distress. Tympanic membranes and canals are normal. Pharyngeal area is normal. Neck is supple without adenopathy or thyromegaly. Cardiac exam shows a regular sinus rhythm without murmurs or gallops. Lungs are clear to auscultation.        Assessment & Plan:  Routine general medical examination at a health care facility - Plan: CBC with Differential/Platelet, Comprehensive metabolic panel, Lipid panel  Obesity (BMI 30-39.9) - Plan: CBC with Differential/Platelet, Comprehensive metabolic panel, Lipid panel  Gastroesophageal reflux disease without esophagitis - Plan: pantoprazole (PROTONIX) 40 MG tablet  Hot flashes, menopausal  History of colonic polyps  Hyperlipidemia with target LDL less than 100 - Plan: Lipid panel, atorvastatin (LIPITOR) 20 MG tablet  History of DVT of lower extremity - Plan: CBC with Differential/Platelet, Comprehensive  metabolic panel  Tremor  Osteoporosis without current pathological fracture, unspecified osteoporosis type - Plan: VITAMIN D 25 Hydroxy (Vit-D Deficiency, Fractures), ibandronate (BONIVA) 150 MG tablet  Mild persistent asthma without complication - Plan: albuterol (PROAIR HFA) 108 (90 Base) MCG/ACT inhaler  HSV-2 infection - Plan: valACYclovir (VALTREX) 500 MG tablet  Herpes She will continue on her present medication regimen and follow-up with gastroenterology on the 5-year plan.  Discussed the use of Boniva to take on an empty stomach with no other medications within at least 45 minutes.  She will use her ProAir on as-needed basis and also for exercise.  No therapy for the hot flashes or the tremor.  Continue on Protonix.

## 2022-04-24 LAB — COMPREHENSIVE METABOLIC PANEL
ALT: 24 IU/L (ref 0–32)
AST: 21 IU/L (ref 0–40)
Albumin/Globulin Ratio: 2.2 (ref 1.2–2.2)
Albumin: 4.8 g/dL (ref 3.9–4.9)
Alkaline Phosphatase: 102 IU/L (ref 44–121)
BUN/Creatinine Ratio: 22 (ref 12–28)
BUN: 21 mg/dL (ref 8–27)
Bilirubin Total: 0.2 mg/dL (ref 0.0–1.2)
CO2: 23 mmol/L (ref 20–29)
Calcium: 9.6 mg/dL (ref 8.7–10.3)
Chloride: 108 mmol/L — ABNORMAL HIGH (ref 96–106)
Creatinine, Ser: 0.95 mg/dL (ref 0.57–1.00)
Globulin, Total: 2.2 g/dL (ref 1.5–4.5)
Glucose: 102 mg/dL — ABNORMAL HIGH (ref 70–99)
Potassium: 5.1 mmol/L (ref 3.5–5.2)
Sodium: 145 mmol/L — ABNORMAL HIGH (ref 134–144)
Total Protein: 7 g/dL (ref 6.0–8.5)
eGFR: 65 mL/min/{1.73_m2} (ref >59)

## 2022-04-24 LAB — CBC WITH DIFFERENTIAL/PLATELET
Basophils Absolute: 0.1 10*3/uL (ref 0.0–0.2)
Basos: 1 %
EOS (ABSOLUTE): 0.1 10*3/uL (ref 0.0–0.4)
Eos: 2 %
Hematocrit: 42.1 % (ref 34.0–46.6)
Hemoglobin: 14.2 g/dL (ref 11.1–15.9)
Immature Grans (Abs): 0.1 10*3/uL (ref 0.0–0.1)
Immature Granulocytes: 1 %
Lymphocytes Absolute: 2.1 10*3/uL (ref 0.7–3.1)
Lymphs: 29 %
MCH: 29.8 pg (ref 26.6–33.0)
MCHC: 33.7 g/dL (ref 31.5–35.7)
MCV: 88 fL (ref 79–97)
Monocytes Absolute: 0.5 10*3/uL (ref 0.1–0.9)
Monocytes: 7 %
Neutrophils Absolute: 4.5 10*3/uL (ref 1.4–7.0)
Neutrophils: 60 %
Platelets: 307 10*3/uL (ref 150–450)
RBC: 4.76 x10E6/uL (ref 3.77–5.28)
RDW: 12.8 % (ref 11.7–15.4)
WBC: 7.4 10*3/uL (ref 3.4–10.8)

## 2022-04-24 LAB — LIPID PANEL
Chol/HDL Ratio: 3.6 ratio (ref 0.0–4.4)
Cholesterol, Total: 208 mg/dL — ABNORMAL HIGH (ref 100–199)
HDL: 58 mg/dL (ref >39)
LDL Chol Calc (NIH): 107 mg/dL — ABNORMAL HIGH (ref 0–99)
Triglycerides: 253 mg/dL — ABNORMAL HIGH (ref 0–149)
VLDL Cholesterol Cal: 43 mg/dL — ABNORMAL HIGH (ref 5–40)

## 2022-04-24 LAB — VITAMIN D 25 HYDROXY (VIT D DEFICIENCY, FRACTURES): Vit D, 25-Hydroxy: 51.2 ng/mL (ref 30.0–100.0)

## 2022-04-25 DIAGNOSIS — G5602 Carpal tunnel syndrome, left upper limb: Secondary | ICD-10-CM | POA: Diagnosis not present

## 2022-04-29 DIAGNOSIS — M25642 Stiffness of left hand, not elsewhere classified: Secondary | ICD-10-CM | POA: Diagnosis not present

## 2022-05-08 ENCOUNTER — Other Ambulatory Visit: Payer: Self-pay | Admitting: Family Medicine

## 2022-05-08 DIAGNOSIS — G5602 Carpal tunnel syndrome, left upper limb: Secondary | ICD-10-CM | POA: Diagnosis not present

## 2022-05-08 DIAGNOSIS — Z1231 Encounter for screening mammogram for malignant neoplasm of breast: Secondary | ICD-10-CM

## 2022-05-08 DIAGNOSIS — M25642 Stiffness of left hand, not elsewhere classified: Secondary | ICD-10-CM | POA: Diagnosis not present

## 2022-05-12 ENCOUNTER — Encounter: Payer: Self-pay | Admitting: Family Medicine

## 2022-05-22 DIAGNOSIS — M25642 Stiffness of left hand, not elsewhere classified: Secondary | ICD-10-CM | POA: Diagnosis not present

## 2022-05-23 ENCOUNTER — Other Ambulatory Visit: Payer: PPO

## 2022-06-04 DIAGNOSIS — G43111 Migraine with aura, intractable, with status migrainosus: Secondary | ICD-10-CM | POA: Diagnosis not present

## 2022-06-17 ENCOUNTER — Telehealth (INDEPENDENT_AMBULATORY_CARE_PROVIDER_SITE_OTHER): Payer: PPO | Admitting: Medical

## 2022-06-17 VITALS — HR 76

## 2022-06-17 DIAGNOSIS — J988 Other specified respiratory disorders: Secondary | ICD-10-CM | POA: Diagnosis not present

## 2022-06-17 DIAGNOSIS — R051 Acute cough: Secondary | ICD-10-CM

## 2022-06-17 MED ORDER — AZITHROMYCIN 250 MG PO TABS: ORAL_TABLET | ORAL | 0 refills | Status: DC

## 2022-06-17 NOTE — Progress Notes (Signed)
Subjective:     Patient ID: Carol [name], female   DOB: 03-16-1951, 71 y.o.   MRN: [id number]  This visit type was conducted due to national recommendations for restrictions regarding the COVID-19 Pandemic (e.g. social distancing) in an effort to limit this patient's exposure and mitigate transmission in our community.  Due to their co-morbid illnesses, this patient is at least at moderate risk for complications without adequate follow up.  This format is felt to be most appropriate for this patient at this time.    Documentation for virtual audio and video telecommunications through [place] encounter:  The patient was located at home. The provider was located in the office. The patient did consent to this visit and is aware of possible charges through their insurance for this visit.  The other persons participating in this telemedicine service were none. Time spent on call was 20 minutes and in review of previous records 20 minutes total.  This virtual service is not related to other E/M service within previous 7 days.   HPI Chief Complaint  Patient presents with   Cough   Has had runny nose for 1.5 week, but now has developed cough, chest burning, congestion in chest, throat hurts, left ear hurts.  Took covid test yesterday and today and both negative.  Has some body aching but no chills, no fever.  Has some loose stool but no nausea or vomiting.  No wheezing, feels a little SOB.  Was around granddaughter who had runny nose last week.  Currently using advil cold and nyquil.  Has hx/o asthma, but currently hasn't been using albuterol.  Nonsmoker.  No other aggravating or relieving factors. No other complaint.  Past Medical History:  Diagnosis Date   Abnormal Pap smear of cervix    in 20's   Anxiety    claustrophobic   Arthritis    "neck, hands" (09/02/2013)   Basal cell carcinoma of forehead 2008; 2014   Dizziness last occurance 10-15-2014   on occasion, not related to  activity   DVT (deep venous thrombosis) ([place]) 06/28/2014   [employer], right leg, behind knee   Environmental allergies    Exercise-induced asthma 30's   GERD (gastroesophageal reflux disease)    H/O hiatal hernia    Herpes simplex 1976   History of colonic polyps 05/21/2016   History of kidney stones    Hyperlipidemia    Migraine teens, returned in 29"s   "depends on the change in the weather; last one was 1 wk ago" (09/02/2013)   Plantar fasciitis    PONV (postoperative nausea and vomiting) 3/ 2015   ponv after gallblaffer   Right ureteral stone    Seasonal allergic rhinitis    Tubular adenoma of colon 05/22/2017   per path report.    Tubular adenoma of colon 06/29/2020   Current Outpatient Medications on File Prior to Visit  Medication Sig Dispense Refill   albuterol (PROAIR HFA) 108 (90 Base) MCG/ACT inhaler INHALE 2 PUFFS INTO THE LUNGS EVERY 6 HOURS AS NEEDED FOR WHEEZING/ SHORTNESS OF BREATH. 8.5 g 0   atorvastatin (LIPITOR) 20 MG tablet Take 1 tablet (20 mg total) by mouth daily. 90 tablet 3   diclofenac sodium (VOLTAREN) 1 % GEL Apply 2 g topically 4 (four) times daily.     HYDROcodone-acetaminophen (NORCO/VICODIN) 5-325 MG tablet Take 1-2 tablets by mouth every 6 (six) hours as needed for pain. (Patient not taking: Reported on 04/23/2022)     ibandronate (BONIVA) 150 MG tablet  Take 1 tablet (150 mg total) by mouth every 30 (thirty) days. Take in the morning with a full glass of water, on an empty stomach, and do not take anything else by mouth or lie down for the next 30 min. 3 tablet 3   methocarbamol (ROBAXIN) 500 MG tablet Take 500 mg by mouth daily as needed (migraine).     pantoprazole (PROTONIX) 40 MG tablet Take 1 tablet (40 mg total) by mouth daily. 90 tablet 3   valACYclovir (VALTREX) 500 MG tablet Take 1 tablet (500 mg total) by mouth daily. 90 tablet 3   zonisamide (ZONEGRAN) 100 MG capsule Take 100 mg by mouth 2 (two) times daily.     No current  facility-administered medications on file prior to visit.    Review of Systems As in subjective    Objective:   Physical Exam Due to coronavirus pandemic stay at home measures, patient visit was virtual and they were not examined in person.   Pulse 76   LMP 06/23/1990 (Approximate)   SpO2 95%   76 pulse, 95% o2 on room air.  Gen: wd, wn, nad Coughing, but no witnessed wheezing or labored breathing      Assessment:     Encounter Diagnoses  Name Primary?   Acute cough Yes   Respiratory tract infection         Plan:       We discussed the symptoms and concerns, limitations of virtual consult.  She tested negative for COVID and this does not sound like a flu.  We discussed possible etiologies, but cannot completely rule out lung infection.  Gave the option of chest x-ray.  At this point she will continue her over-the-counter remedies for cough and congestion, she will continue to hydrate well, she will begin antibiotic below.  We discussed the pros and cons of this.  I also advise she go ahead and use her inhaler at least twice daily next few days.  If worse the next 2 days then call back to get reevaluated.  We discussed usual timeframe to see improvement  Daphnee was seen today for cough.  Diagnoses and all orders for this visit:  Acute cough  Respiratory tract infection  Other orders -     azithromycin (ZITHROMAX) 250 MG tablet; 2 tablets day 1, then 1 tablet days 2-4   F/u prn

## 2022-06-24 DIAGNOSIS — J0100 Acute maxillary sinusitis, unspecified: Secondary | ICD-10-CM | POA: Diagnosis not present

## 2022-06-24 DIAGNOSIS — Z6830 Body mass index (BMI) 30.0-30.9, adult: Secondary | ICD-10-CM | POA: Diagnosis not present

## 2022-06-24 DIAGNOSIS — H66002 Acute suppurative otitis media without spontaneous rupture of ear drum, left ear: Secondary | ICD-10-CM | POA: Diagnosis not present

## 2022-06-30 ENCOUNTER — Ambulatory Visit
Admission: RE | Admit: 2022-06-30 | Discharge: 2022-06-30 | Disposition: A | Discharge status: Home / Self Care | Payer: PPO | Source: Ambulatory Visit | Attending: Family Medicine | Admitting: Family Medicine

## 2022-06-30 DIAGNOSIS — Z1231 Encounter for screening mammogram for malignant neoplasm of breast: Secondary | ICD-10-CM | POA: Diagnosis not present

## 2022-07-01 ENCOUNTER — Other Ambulatory Visit: Payer: Self-pay | Admitting: Family Medicine

## 2022-07-01 DIAGNOSIS — R928 Other abnormal and inconclusive findings on diagnostic imaging of breast: Secondary | ICD-10-CM

## 2022-07-08 ENCOUNTER — Other Ambulatory Visit: Payer: Self-pay | Admitting: Family Medicine

## 2022-07-08 ENCOUNTER — Ambulatory Visit
Admission: RE | Admit: 2022-07-08 | Discharge: 2022-07-08 | Disposition: A | Discharge status: Home / Self Care | Payer: PPO | Source: Ambulatory Visit | Attending: Family Medicine | Admitting: Family Medicine

## 2022-07-08 DIAGNOSIS — R928 Other abnormal and inconclusive findings on diagnostic imaging of breast: Secondary | ICD-10-CM

## 2022-07-08 DIAGNOSIS — N6310 Unspecified lump in the right breast, unspecified quadrant: Secondary | ICD-10-CM

## 2022-07-08 DIAGNOSIS — N6489 Other specified disorders of breast: Secondary | ICD-10-CM | POA: Diagnosis not present

## 2022-07-16 ENCOUNTER — Other Ambulatory Visit: Payer: PPO

## 2022-08-04 DIAGNOSIS — N3020 Other chronic cystitis without hematuria: Secondary | ICD-10-CM | POA: Diagnosis not present

## 2022-08-05 ENCOUNTER — Ambulatory Visit (INDEPENDENT_AMBULATORY_CARE_PROVIDER_SITE_OTHER): Payer: PPO

## 2022-08-05 VITALS — BP 112/70 | HR 76 | Temp 98.0°F | Ht 61.5 in | Wt 164.0 lb

## 2022-08-05 DIAGNOSIS — S83241D Other tear of medial meniscus, current injury, right knee, subsequent encounter: Secondary | ICD-10-CM | POA: Diagnosis not present

## 2022-08-05 DIAGNOSIS — Z0000 Encounter for general adult medical examination without abnormal findings: Secondary | ICD-10-CM | POA: Diagnosis not present

## 2022-08-05 DIAGNOSIS — M25561 Pain in right knee: Secondary | ICD-10-CM | POA: Diagnosis not present

## 2022-08-05 NOTE — Patient Instructions (Signed)
[name] , Thank you for taking time to come for your Medicare Wellness Visit. I appreciate your ongoing commitment to your health goals. Please review the following plan we discussed and let me know if I can assist you in the future.   These are the goals we discussed:  Goals      Patient Stated     08/02/2021, build stamina back up     Patient Stated     08/05/2022, wants to lose weight        This is a list of the screening recommended for you and due dates:  Health Maintenance  Topic Date Due   COVID-19 Vaccine (7 - 2023-24 season) 06/06/2022   Mammogram  07/01/2023   Medicare Annual Wellness Visit  08/06/2023   DTaP/Tdap/Td vaccine (4 - Td or Tdap) 12/13/2028   Colon Cancer Screening  06/27/2030   Pneumonia Vaccine  Completed   Flu Shot  Completed   DEXA scan (bone density measurement)  Completed   Hepatitis C Screening: USPSTF Recommendation to screen - Ages 66-79 yo.  Completed   Zoster (Shingles) Vaccine  Completed   HPV Vaccine  Aged Out    Advanced directives: Please bring a copy of your POA (Power of [place]) and/or Living Will to your next appointment.   Conditions/risks identified: none  Next appointment: Follow up in one year for your annual wellness visit    Preventive Care 65 Years and Older, Female Preventive care refers to lifestyle choices and visits with your health care provider that can promote health and wellness. What does preventive care include? A yearly physical exam. This is also called an annual well check. Dental exams once or twice a year. Routine eye exams. Ask your health care provider how often you should have your eyes checked. Personal lifestyle choices, including: Daily care of your teeth and gums. Regular physical activity. Eating a healthy diet. Avoiding tobacco and drug use. Limiting alcohol use. Practicing safe sex. Taking low-dose aspirin every day. Taking vitamin and mineral supplements as recommended by your  health care provider. What happens during an annual well check? The services and screenings done by your health care provider during your annual well check will depend on your age, overall health, lifestyle risk factors, and family history of disease. Counseling  Your health care provider may ask you questions about your: Alcohol use. Tobacco use. Drug use. Emotional well-being. Home and relationship well-being. Sexual activity. Eating habits. History of falls. Memory and ability to understand (cognition). Work and work Statistician. Reproductive health. Screening  You may have the following tests or measurements: Height, weight, and BMI. Blood pressure. Lipid and cholesterol levels. These may be checked every 5 years, or more frequently if you are over 89 years old. Skin check. Lung cancer screening. You may have this screening every year starting at age 38 if you have a 30-pack-year history of smoking and currently smoke or have quit within the past 15 years. Fecal occult blood test (FOBT) of the stool. You may have this test every year starting at age 40. Flexible sigmoidoscopy or colonoscopy. You may have a sigmoidoscopy every 5 years or a colonoscopy every 10 years starting at age 41. Hepatitis C blood test. Hepatitis B blood test. Sexually transmitted disease (STD) testing. Diabetes screening. This is done by checking your blood sugar (glucose) after you have not eaten for a while (fasting). You may have this done every 1-3 years. Bone density scan. This is done to screen for osteoporosis.  You may have this done starting at age 70. Mammogram. This may be done every 1-2 years. Talk to your health care provider about how often you should have regular mammograms. Talk with your health care provider about your test results, treatment options, and if necessary, the need for more tests. Vaccines  Your health care provider may recommend certain vaccines, such as: Influenza vaccine.  This is recommended every year. Tetanus, diphtheria, and acellular pertussis (Tdap, Td) vaccine. You may need a Td booster every 10 years. Zoster vaccine. You may need this after age 57. Pneumococcal 13-valent conjugate (PCV13) vaccine. One dose is recommended after age 74. Pneumococcal polysaccharide (PPSV23) vaccine. One dose is recommended after age 45. Talk to your health care provider about which screenings and vaccines you need and how often you need them. This information is not intended to replace advice given to you by your health care provider. Make sure you discuss any questions you have with your health care provider. Document Released: 07/06/2015 Document Revised: 02/27/2016 Document Reviewed: 04/10/2015 Elsevier Interactive Patient Education  2017 [place] Prevention in the Home Falls can cause injuries. They can happen to people of all ages. There are many things you can do to make your home safe and to help prevent falls. What can I do on the outside of my home? Regularly fix the edges of walkways and driveways and fix any cracks. Remove anything that might make you trip as you walk through a door, such as a raised step or threshold. Trim any bushes or trees on the path to your home. Use bright outdoor lighting. Clear any walking paths of anything that might make someone trip, such as rocks or tools. Regularly check to see if handrails are loose or broken. Make sure that both sides of any steps have handrails. Any raised decks and porches should have guardrails on the edges. Have any leaves, snow, or ice cleared regularly. Use sand or salt on walking paths during winter. Clean up any spills in your garage right away. This includes oil or grease spills. What can I do in the bathroom? Use night lights. Install grab bars by the toilet and in the tub and shower. Do not use towel bars as grab bars. Use non-skid mats or decals in the tub or shower. If you need to sit  down in the shower, use a plastic, non-slip stool. Keep the floor dry. Clean up any water that spills on the floor as soon as it happens. Remove soap buildup in the tub or shower regularly. Attach bath mats securely with double-sided non-slip rug tape. Do not have throw rugs and other things on the floor that can make you trip. What can I do in the bedroom? Use night lights. Make sure that you have a light by your bed that is easy to reach. Do not use any sheets or blankets that are too big for your bed. They should not hang down onto the floor. Have a firm chair that has side arms. You can use this for support while you get dressed. Do not have throw rugs and other things on the floor that can make you trip. What can I do in the kitchen? Clean up any spills right away. Avoid walking on wet floors. Keep items that you use a lot in easy-to-reach places. If you need to reach something above you, use a strong step stool that has a grab bar. Keep electrical cords out of the way. Do not use  floor polish or wax that makes floors slippery. If you must use wax, use non-skid floor wax. Do not have throw rugs and other things on the floor that can make you trip. What can I do with my stairs? Do not leave any items on the stairs. Make sure that there are handrails on both sides of the stairs and use them. Fix handrails that are broken or loose. Make sure that handrails are as long as the stairways. Check any carpeting to make sure that it is firmly attached to the stairs. Fix any carpet that is loose or worn. Avoid having throw rugs at the top or bottom of the stairs. If you do have throw rugs, attach them to the floor with carpet tape. Make sure that you have a light switch at the top of the stairs and the bottom of the stairs. If you do not have them, ask someone to add them for you. What else can I do to help prevent falls? Wear shoes that: Do not have high heels. Have rubber bottoms. Are  comfortable and fit you well. Are closed at the toe. Do not wear sandals. If you use a stepladder: Make sure that it is fully opened. Do not climb a closed stepladder. Make sure that both sides of the stepladder are locked into place. Ask someone to hold it for you, if possible. Clearly mark and make sure that you can see: Any grab bars or handrails. First and last steps. Where the edge of each step is. Use tools that help you move around (mobility aids) if they are needed. These include: Canes. Walkers. Scooters. Crutches. Turn on the lights when you go into a dark area. Replace any light bulbs as soon as they burn out. Set up your furniture so you have a clear path. Avoid moving your furniture around. If any of your floors are uneven, fix them. If there are any pets around you, be aware of where they are. Review your medicines with your doctor. Some medicines can make you feel dizzy. This can increase your chance of falling. Ask your doctor what other things that you can do to help prevent falls. This information is not intended to replace advice given to you by your health care provider. Make sure you discuss any questions you have with your health care provider. Document Released: 04/05/2009 Document Revised: 11/15/2015 Document Reviewed: 07/14/2014 Elsevier Interactive Patient Education  2017 [employer].

## 2022-08-05 NOTE — Progress Notes (Signed)
Subjective:   Carol [name] is a 72 y.o. female who presents for Medicare Annual (Subsequent) preventive examination.  Review of Systems     Cardiac Risk Factors include: advanced age (>80mn, >>20women);dyslipidemia;obesity (BMI >30kg/m2)     Objective:    Today's Vitals   08/05/22 1024 08/05/22 1028  BP: 112/70   Pulse: 76   Temp: 98 F (36.7 C)   TempSrc: Oral   SpO2: 97%   Weight: 164 lb (74.4 kg)   Height: 5' 1.5" (1.562 m)   PainSc:  4    Body mass index is 30.49 kg/m.     08/05/2022   10:34 AM 01/07/2022   10:21 AM 08/02/2021   10:46 AM 05/02/2021    6:39 PM 05/02/2021    2:37 PM 08/23/2020    3:04 PM 01/19/2015    4:02 PM  Advanced Directives  Does Patient Have a Medical Advance Directive? Yes Yes Yes Yes Yes Yes Yes  Type of AParamed[place]of ATwin CityLiving will Living will;Healthcare Power of ALa PazLiving will HWoodallLiving will HGoodrichLiving will HSlatonLiving will Living will;Healthcare Power of Attorney  Does patient want to make changes to medical advance directive?  No - Patient declined  No - Patient declined No - Patient declined No - Patient declined No - Patient declined  Copy of HBadenin Chart? No - copy requested  No - copy requested No - copy requested No - copy requested No - copy requested Yes    Current Medications (verified) Outpatient Encounter Medications as of 08/05/2022  Medication Sig   albuterol (PROAIR HFA) 108 (90 Base) MCG/ACT inhaler INHALE 2 PUFFS INTO THE LUNGS EVERY 6 HOURS AS NEEDED FOR WHEEZING/ SHORTNESS OF BREATH.   atorvastatin (LIPITOR) 20 MG tablet Take 1 tablet (20 mg total) by mouth daily.   diclofenac sodium (VOLTAREN) 1 % GEL Apply 2 g topically 4 (four) times daily.   ibandronate (BONIVA) 150 MG tablet Take 1 tablet (150 mg total) by mouth every 30 (thirty) days. Take in the  morning with a full glass of water, on an empty stomach, and do not take anything else by mouth or lie down for the next 30 min.   methocarbamol (ROBAXIN) 500 MG tablet Take 500 mg by mouth daily as needed (migraine).   pantoprazole (PROTONIX) 40 MG tablet Take 1 tablet (40 mg total) by mouth daily.   valACYclovir (VALTREX) 500 MG tablet Take 1 tablet (500 mg total) by mouth daily.   zonisamide (ZONEGRAN) 100 MG capsule Take 100 mg by mouth 2 (two) times daily.   azithromycin (ZITHROMAX) 250 MG tablet 2 tablets day 1, then 1 tablet days 2-4   HYDROcodone-acetaminophen (NORCO/VICODIN) 5-325 MG tablet Take 1-2 tablets by mouth every 6 (six) hours as needed for pain. (Patient not taking: Reported on 04/23/2022)   No facility-administered encounter medications on file as of 08/05/2022.    Allergies (verified) Iohexol, Ivp dye [iodinated contrast media], Codeine, and Onion   History: Past Medical History:  Diagnosis Date   Abnormal Pap smear of cervix    in 20's   Anxiety    claustrophobic   Arthritis    "neck, hands" (09/02/2013)   Basal cell carcinoma of forehead 2008; 2014   Dizziness last occurance 10-15-2014   on occasion, not related to activity   DVT (deep venous thrombosis) ([place] 06/28/2014   Dr [employer] [employer] right leg, behind knee  Environmental allergies    Exercise-induced asthma 30's   GERD (gastroesophageal reflux disease)    H/O hiatal hernia    Herpes simplex 1976   History of colonic polyps 05/21/2016   History of kidney stones    Hyperlipidemia    Migraine teens, returned in 39"s   "depends on the change in the weather; last one was 1 wk ago" (09/02/2013)   Plantar fasciitis    PONV (postoperative nausea and vomiting) 3/ 2015   ponv after gallblaffer   Right ureteral stone    Seasonal allergic rhinitis    Tubular adenoma of colon 05/22/2017   per path report.    Tubular adenoma of colon 06/29/2020   Past Surgical History:  Procedure Laterality Date   [place]   CHOLECYSTECTOMY N/A 09/02/2013   Procedure: LAPAROSCOPIC CHOLECYSTECTOMY;  Surgeon: Harl Bowie, MD;  Location: [place];  Service: General;  Laterality: N/A;   CRYOTHERAPY     for abnormal pap smear in 20's   CYSTOSCOPY W/ URETERAL STENT PLACEMENT  03/05/2012   Procedure: CYSTOSCOPY WITH RETROGRADE PYELOGRAM/URETERAL STENT PLACEMENT;  Surgeon: Hanley Ben, MD;  Location: [place];  Service: Urology;  Laterality: Right;   CYSTOSCOPY W/ URETERAL STENT PLACEMENT Right 01/19/2015   Procedure: CYSTOSCOPY WITH RETROGRADE PYELOGRAM/URETERAL  RIGHT STENT PLACEMENT;  Surgeon: Alexis Frock, MD;  [facility];  Service: Urology;  Laterality: Right;   CYSTOSCOPY WITH RETROGRADE PYELOGRAM, URETEROSCOPY AND STENT PLACEMENT Right 09/08/2014   Procedure: CYSTOSCOPY WITH RIGHT  RETROGRADE PYELOGRAM, URETEROSCOPY AND RIGHT STENT PLACEMENT;  Surgeon: Raynelle Bring, MD;  [facility];  Service: Urology;  Laterality: Right;   CYSTOSCOPY WITH RETROGRADE PYELOGRAM, URETEROSCOPY AND STENT PLACEMENT Right 10/19/2014   Procedure: CYSTOSCOPY WITH Bilateral RETROGRADE PYELOGRAM/RIGHT URETEROSCOPY,RIGHT JJ STENT;  Surgeon: Carolan Clines, MD;  [facility];  Service: Urology;  Laterality: Right;   CYSTOSCOPY/RETROGRADE/URETEROSCOPY  11/18/2011   Procedure: CYSTOSCOPY/RETROGRADE/URETEROSCOPY;  Surgeon: Ailene Rud, MD;  [facility];  Service: Urology;  Laterality: Right;   CYSTOSCOPY/RETROGRADE/URETEROSCOPY/STONE EXTRACTION WITH BASKET  10/21/2011   Procedure: CYSTOSCOPY/RETROGRADE/URETEROSCOPY/STONE EXTRACTION WITH BASKET;  Surgeon: Ailene Rud, MD;  [facility];  Service: Urology;  Laterality: Right;   CYSTOSCOPY/RETROGRADE/URETEROSCOPY/STONE EXTRACTION WITH BASKET Right 09/10/2012   Procedure: RIGHT URETEROSCOPY/STONE EXTRACTION/LASER LITHOTRIPSY/POSSIBLE DOUBLE J STENT PLACEMENT;  Surgeon:  Ailene Rud, MD;  [facility];  Service: Urology;  Laterality: Right;   CYSTOSCOPY/URETEROSCOPY/HOLMIUM LASER/STENT PLACEMENT Left 05/02/2021   Procedure: CYSTOSCOPY/URETEROSCOPY/HOLMIUM LASER/STENT PLACEMENT;  Surgeon: Festus Aloe, MD;  [facility];  Service: Urology;  Laterality: Left;   MOHS SURGERY  2008; 2014   PLANTAR FASCIA SURGERY  05/2016   POSTERIOR PINNACLE MESH SACROSPINOUS REPAIR  11-22-2010   RECURRENT ENTEROCELE   RIGHT URETEROSCOPIC STONE EXTRACTION W/ STENT PLACEMENT  08-12-2011   x2 stents   ROBOTICALLY ASSISTED LAPAROSCOPIC URETERAL RE-IMPLANTATION Right 01/19/2015   Procedure: ROBOTICALLY ASSISTED LAPAROSCOPIC URETERAL RE-IMPLANTATION RIGHT;  Surgeon: Alexis Frock, MD;  [facility];  Service: Urology;  Laterality: Right;   TUBAL LIGATION  1991   VAULT SUSPENSION (DERMAL GRAFT)/ ENTEROCELE AND RECTOCELE REPAIR  12-05-2005   VAULT PROLAPSE/ ENTERCELE/ RECTOCELE   Family History  Problem Relation Age of Onset   Cancer Father        bladder cancer   Social History   Socioeconomic History   Marital status: Married    Spouse name: Not on file   Number of children: Not on  file   Years of education: Not on file   Highest education level: Not on file  Occupational History   Not on file  Tobacco Use   Smoking status: Never   Smokeless tobacco: Never  Vaping Use   Vaping Use: Never used  Substance and Sexual Activity   Alcohol use: Not Currently   Drug use: No   Sexual activity: Yes    Partners: Female    Birth control/protection: Surgical    Comment: hysterectomy  Other Topics Concern   Not on file  Social History Narrative   Not on file   Social Determinants of Health   Financial Resource Strain: Low Risk  (08/05/2022)   Overall Financial Resource Strain (CARDIA)    Difficulty of Paying Living Expenses: Not hard at all  Food Insecurity: No Food Insecurity (08/05/2022)   Hunger Vital Sign    Worried About Running Out of Food  in the Last Year: Never true    [place] in the Last Year: Never true  Transportation Needs: No Transportation Needs (08/05/2022)   PRAPARE - Hydrologist (Medical): No    Lack of Transportation (Non-Medical): No  Physical Activity: Insufficiently Active (08/05/2022)   Exercise Vital Sign    Days of Exercise per Week: 3 days    Minutes of Exercise per Session: 30 min  Stress: No Stress Concern Present (08/05/2022)   [place]    Feeling of Stress : Not at all  Social Connections: Not on file    Tobacco Counseling Counseling given: Not Answered   Clinical Intake:  Pre-visit preparation completed: Yes  Pain : 0-10 Pain Score: 4  Pain Type: Acute pain Pain Location: Groin Pain Orientation: Left Pain Descriptors / Indicators: Dull Pain Onset: More than a month ago Pain Frequency: Constant     Nutritional Status: BMI > 30  Obese Nutritional Risks: None Diabetes: No  How often do you need to have someone help you when you read instructions, pamphlets, or other written materials from your doctor or pharmacy?: 1 - Never  Diabetic? no  Interpreter Needed?: No  Information entered by :: NAllen LPN   Activities of Daily Living    08/05/2022   10:36 AM  In your present state of health, do you have any difficulty performing the following activities:  Hearing? 0  Vision? 1  Comment at night  Difficulty concentrating or making decisions? 0  Walking or climbing stairs? 0  Dressing or bathing? 0  Doing errands, shopping? 0  Preparing Food and eating ? N  Using the Toilet? N  In the past six months, have you accidently leaked urine? Y  Do you have problems with loss of bowel control? Y  Managing your Medications? N  Managing your Finances? N  Housekeeping or managing your Housekeeping? N    Patient Care Team: Denita Lung, MD as PCP - General (Family  Medicine)  Indicate any recent Medical Services you may have received from other than [facility] providers in the past year (date may be approximate).     Assessment:   This is a routine wellness examination for [place].  Hearing/Vision screen Vision Screening - Comments:: Regular eye exams, [employer]  Dietary issues and exercise activities discussed: Current Exercise Habits: Home exercise routine, Type of exercise: yoga (pedal machine), Time (Minutes): 30, Frequency (Times/Week): 3, Weekly Exercise (Minutes/Week): 90   Goals Addressed  This Visit's Progress    Patient Stated       08/05/2022, wants to lose weight       Depression Screen    08/05/2022   10:35 AM 08/14/2021    2:16 PM 08/02/2021   10:48 AM 08/23/2020    3:05 PM 05/24/2020    9:32 AM 03/16/2017    2:05 PM 10/15/2015    2:25 PM  PHQ 2/9 Scores  PHQ - 2 Score 0 0 0 0 0 0 0  PHQ- 9 Score 3          Fall Risk    08/05/2022   10:35 AM 08/02/2021   10:46 AM 07/31/2021    9:54 AM 08/23/2020    3:04 PM 01/17/2020   12:48 PM  Fall Risk   Falls in the past year? 0 1 1 0 0  Comment  passed out   [employer] Telephone Survey: data to providers prior to load  Number falls in past yr: 0 0 0 0   Injury with Fall? 0 1 1 0   Risk for fall due to : Medication side effect Medication side effect  No Fall Risks   Follow up Falls prevention discussed;Education provided;Falls evaluation completed Falls evaluation completed;Education provided;Falls prevention discussed  Falls evaluation completed     FALL RISK PREVENTION PERTAINING TO THE HOME:  Any stairs in or around the home? Yes  If so, are there any without handrails? No  Home free of loose throw rugs in walkways, pet beds, electrical cords, etc? Yes  Adequate lighting in your home to reduce risk of falls? Yes   ASSISTIVE DEVICES UTILIZED TO PREVENT FALLS:  Life alert? No  Use of a cane, walker or w/c? No  Grab bars in the bathroom? Yes  Shower chair or bench in  shower? No  Elevated toilet seat or a handicapped toilet? Yes   TIMED UP AND GO:  Was the test performed? Yes .  Length of time to ambulate 10 feet: 5 sec.   Gait steady and fast without use of assistive device  Cognitive Function:        08/05/2022   10:40 AM 08/02/2021   10:48 AM  6CIT Screen  What Year? 0 points 0 points  What month? 0 points 0 points  What time? 0 points 0 points  Count back from 20 0 points 0 points  Months in reverse 0 points 0 points  Repeat phrase 0 points 0 points  Total Score 0 points 0 points    Immunizations Immunization History  Administered Date(s) Administered   COVID-19, mRNA, vaccine(Comirnaty)12 years and older 04/11/2022   Fluad Quad(high Dose 65+) 03/13/2019, 02/28/2020, 03/07/2021, 03/04/2022   Influenza Whole 04/19/2007, 09/04/2008   Influenza-Unspecified 03/23/2013, 03/14/2016, 04/12/2017, 03/13/2019   PFIZER Comirnaty(Gray Top)Covid-19 Tri-Sucrose Vaccine 10/18/2020   PFIZER(Purple Top)SARS-COV-2 Vaccination 07/31/2019, 08/24/2019, 04/04/2020   Pfizer Covid-19 Vaccine Bivalent Booster 4yr & up 04/09/2021   Pneumococcal Conjugate-13 12/14/2018   Pneumococcal Polysaccharide-23 12/22/2019   Td 12/13/2001   Tdap 06/23/2008, 12/14/2018   Zoster Recombinat (Shingrix) 06/17/2017, 08/24/2017   Zoster, Live 06/01/2015    TDAP status: Up to date  Flu Vaccine status: Up to date  Pneumococcal vaccine status: Up to date  Covid-19 vaccine status: Completed vaccines  Qualifies for Shingles Vaccine? Yes   Zostavax completed Yes   Shingrix Completed?: Yes  Screening Tests Health Maintenance  Topic Date Due   COVID-19 Vaccine (7 - 2023-24 season) 06/06/2022   MAMMOGRAM  07/01/2023  Medicare Annual Wellness (AWV)  08/06/2023   DTaP/Tdap/Td (4 - Td or Tdap) 12/13/2028   COLONOSCOPY (Pts 45-85yr Insurance coverage will need to be confirmed)  06/27/2030   Pneumonia Vaccine 70 Years old  Completed   INFLUENZA VACCINE  Completed    DEXA SCAN  Completed   Hepatitis C Screening  Completed   Zoster Vaccines- Shingrix  Completed   HPV VACCINES  Aged Out    Health Maintenance  Health Maintenance Due  Topic Date Due   COVID-19 Vaccine (7 - 2023-24 season) 06/06/2022    Colorectal cancer screening: Type of screening: Colonoscopy. Completed 06/27/2020. Repeat every 7 years  Mammogram status: Completed 06/30/2022. Repeat every year  Bone Density status: Completed 03/24/2022.   Lung Cancer Screening: (Low Dose CT Chest recommended if Age 264-80years, 30 pack-year currently smoking OR have quit w/in 15years.) does not qualify.   Lung Cancer Screening Referral: no  Additional Screening:  Hepatitis C Screening: does qualify; Completed 05/15/2015  Vision Screening: Recommended annual ophthalmology exams for early detection of glaucoma and other disorders of the eye. Is the patient up to date with their annual eye exam?  Yes  Who is the provider or what is the name of the office in which the patient attends annual eye exams? Dr.Miller If pt is not established with a provider, would they like to be referred to a provider to establish care? No .   Dental Screening: Recommended annual dental exams for proper oral hygiene  Community Resource Referral / Chronic Care Management: CRR required this visit?  No   CCM required this visit?  No      Plan:     I have personally reviewed and noted the following in the patient's chart:   Medical and social history Use of alcohol, tobacco or illicit drugs  Current medications and supplements including opioid prescriptions. Patient is not currently taking opioid prescriptions. Functional ability and status Nutritional status Physical activity Advanced directives List of other physicians Hospitalizations, surgeries, and ER visits in previous 12 months Vitals Screenings to include cognitive, depression, and falls Referrals and appointments  In addition, I have reviewed and  discussed with patient certain preventive protocols, quality metrics, and best practice recommendations. A written personalized care plan for preventive services as well as general preventive health recommendations were provided to patient.     NKellie Simmering LPN   [id number]  Nurse Notes: none

## 2022-08-08 ENCOUNTER — Ambulatory Visit: Payer: PPO

## 2022-08-18 ENCOUNTER — Encounter (HOSPITAL_BASED_OUTPATIENT_CLINIC_OR_DEPARTMENT_OTHER): Payer: Self-pay | Admitting: Obstetrics & Gynecology

## 2022-08-18 ENCOUNTER — Ambulatory Visit (INDEPENDENT_AMBULATORY_CARE_PROVIDER_SITE_OTHER): Payer: PPO | Admitting: Obstetrics & Gynecology

## 2022-08-18 VITALS — BP 158/83 | HR 63 | Ht 61.0 in | Wt 166.0 lb

## 2022-08-18 DIAGNOSIS — Z86718 Personal history of other venous thrombosis and embolism: Secondary | ICD-10-CM | POA: Diagnosis not present

## 2022-08-18 DIAGNOSIS — M6289 Other specified disorders of muscle: Secondary | ICD-10-CM

## 2022-08-18 DIAGNOSIS — Z6831 Body mass index (BMI) 31.0-31.9, adult: Secondary | ICD-10-CM | POA: Diagnosis not present

## 2022-08-18 DIAGNOSIS — Z8619 Personal history of other infectious and parasitic diseases: Secondary | ICD-10-CM | POA: Diagnosis not present

## 2022-08-18 DIAGNOSIS — R1032 Left lower quadrant pain: Secondary | ICD-10-CM

## 2022-08-18 DIAGNOSIS — Z9189 Other specified personal risk factors, not elsewhere classified: Secondary | ICD-10-CM | POA: Diagnosis not present

## 2022-08-18 NOTE — Progress Notes (Unsigned)
72 y.o. [id number] Married White or Caucasian female here for breast and pelvic exam. Denies vaginal bleeding.  Has concerns about weight and cholesterol.  Doesn't feel she can eat any less.  Has done weight watchers.  Lost 10 pounds and then got stuck.  PCP has recommended if she lost weight, this would help cholesterol.    Reports in October, twisted and had some LLQ pain.  She felt she pulled something but she is still having soreness there.  Pain is not sharp but pain is there all the time.    Sleep is a concern as well.  Some nights she just cannot get to sleep.  Reports she has trouble falling asleep and then will wake up 30 minutes later.  Goes to be around 10pm.  If does get to sleep, she will wake up 3-4pm and then can't get back to sleep.  If tries to read during the day, she will fall asleep very quickly.  She will then have a cat nap and then wake up.    H/o HSV in her early 20's.  Patient's last menstrual period was 06/23/1990 (approximate).            Health Maintenance: PCP:  Dr. Redmond [employer].  Last wellness appt was 04/2022.  Did blood work at that appt: yes Vaccines are up to date:  yes Colonoscopy:  06/27/2020, follow up 7 years MMG:  06/30/2022 Needs additional images.  Follow up was reassuring.  Follow up 6 months scheduled.   BMD:  2023 Last pap smear:  07/11/2019 Negative.   H/o abnormal pap smear:  no    reports that she has never smoked. She has never used smokeless tobacco. She reports that she does not currently use alcohol. She reports that she does not use drugs.  Past Medical History:  Diagnosis Date   Abnormal Pap smear of cervix    in 20's   Anxiety    claustrophobic   Arthritis    "neck, hands" (09/02/2013)   Basal cell carcinoma of forehead 2008; 2014   Dizziness last occurance 10-15-2014   on occasion, not related to activity   DVT (deep venous thrombosis) ([place]) 06/28/2014   Dr Redmond [employer], right leg, behind knee   Environmental allergies    Exercise-induced  asthma 30's   GERD (gastroesophageal reflux disease)    H/O hiatal hernia    Herpes simplex 1976   History of colonic polyps 05/21/2016   History of kidney stones    Hyperlipidemia    Migraine teens, returned in 30"s   "depends on the change in the weather; last one was 1 wk ago" (09/02/2013)   Plantar fasciitis    PONV (postoperative nausea and vomiting) 3/ 2015   ponv after gallblaffer   Right ureteral stone    Seasonal allergic rhinitis    Tubular adenoma of colon 05/22/2017   per path report.    Tubular adenoma of colon 06/29/2020    Past Surgical History:  Procedure Laterality Date   [place]   CHOLECYSTECTOMY N/A 09/02/2013   Procedure: LAPAROSCOPIC CHOLECYSTECTOMY;  Surgeon: Harl Bowie, MD;  Location: [place];  Service: General;  Laterality: N/A;   CRYOTHERAPY     for abnormal pap smear in 20's   CYSTOSCOPY W/ URETERAL STENT PLACEMENT  03/05/2012   Procedure: CYSTOSCOPY WITH RETROGRADE PYELOGRAM/URETERAL STENT PLACEMENT;  Surgeon: Hanley Ben, MD;  Location: [place];  Service: Urology;  Laterality: Right;  CYSTOSCOPY W/ URETERAL STENT PLACEMENT Right 01/19/2015   Procedure: CYSTOSCOPY WITH RETROGRADE PYELOGRAM/URETERAL  RIGHT STENT PLACEMENT;  Surgeon: Alexis Frock, MD;  [facility];  Service: Urology;  Laterality: Right;   CYSTOSCOPY WITH RETROGRADE PYELOGRAM, URETEROSCOPY AND STENT PLACEMENT Right 09/08/2014   Procedure: CYSTOSCOPY WITH RIGHT  RETROGRADE PYELOGRAM, URETEROSCOPY AND RIGHT STENT PLACEMENT;  Surgeon: Raynelle Bring, MD;  [facility];  Service: Urology;  Laterality: Right;   CYSTOSCOPY WITH RETROGRADE PYELOGRAM, URETEROSCOPY AND STENT PLACEMENT Right 10/19/2014   Procedure: CYSTOSCOPY WITH Bilateral RETROGRADE PYELOGRAM/RIGHT URETEROSCOPY,RIGHT JJ STENT;  Surgeon: Carolan Clines, MD;  [facility];  Service: Urology;  Laterality: Right;   CYSTOSCOPY/RETROGRADE/URETEROSCOPY   11/18/2011   Procedure: CYSTOSCOPY/RETROGRADE/URETEROSCOPY;  Surgeon: Ailene Rud, MD;  [facility];  Service: Urology;  Laterality: Right;   CYSTOSCOPY/RETROGRADE/URETEROSCOPY/STONE EXTRACTION WITH BASKET  10/21/2011   Procedure: CYSTOSCOPY/RETROGRADE/URETEROSCOPY/STONE EXTRACTION WITH BASKET;  Surgeon: Ailene Rud, MD;  [facility];  Service: Urology;  Laterality: Right;   CYSTOSCOPY/RETROGRADE/URETEROSCOPY/STONE EXTRACTION WITH BASKET Right 09/10/2012   Procedure: RIGHT URETEROSCOPY/STONE EXTRACTION/LASER LITHOTRIPSY/POSSIBLE DOUBLE J STENT PLACEMENT;  Surgeon: Ailene Rud, MD;  [facility];  Service: Urology;  Laterality: Right;   CYSTOSCOPY/URETEROSCOPY/HOLMIUM LASER/STENT PLACEMENT Left 05/02/2021   Procedure: CYSTOSCOPY/URETEROSCOPY/HOLMIUM LASER/STENT PLACEMENT;  Surgeon: Festus Aloe, MD;  [facility];  Service: Urology;  Laterality: Left;   MOHS SURGERY  2008; 2014   PLANTAR FASCIA SURGERY  05/2016   POSTERIOR PINNACLE MESH SACROSPINOUS REPAIR  11-22-2010   RECURRENT ENTEROCELE   RIGHT URETEROSCOPIC STONE EXTRACTION W/ STENT PLACEMENT  08-12-2011   x2 stents   ROBOTICALLY ASSISTED LAPAROSCOPIC URETERAL RE-IMPLANTATION Right 01/19/2015   Procedure: ROBOTICALLY ASSISTED LAPAROSCOPIC URETERAL RE-IMPLANTATION RIGHT;  Surgeon: Alexis Frock, MD;  [facility];  Service: Urology;  Laterality: Right;   TUBAL LIGATION  1991   VAULT SUSPENSION (DERMAL GRAFT)/ ENTEROCELE AND RECTOCELE REPAIR  12-05-2005   VAULT PROLAPSE/ ENTERCELE/ RECTOCELE    Current Outpatient Medications  Medication Sig Dispense Refill   albuterol (PROAIR HFA) 108 (90 Base) MCG/ACT inhaler INHALE 2 PUFFS INTO THE LUNGS EVERY 6 HOURS AS NEEDED FOR WHEEZING/ SHORTNESS OF BREATH. 8.5 g 0   atorvastatin (LIPITOR) 20 MG tablet Take 1 tablet (20 mg total) by mouth daily. 90 tablet 3   diclofenac sodium (VOLTAREN) 1 % GEL Apply 2 g topically 4 (four) times  daily.     ibandronate (BONIVA) 150 MG tablet Take 1 tablet (150 mg total) by mouth every 30 (thirty) days. Take in the morning with a full glass of water, on an empty stomach, and do not take anything else by mouth or lie down for the next 30 min. 3 tablet 3   methocarbamol (ROBAXIN) 500 MG tablet Take 500 mg by mouth daily as needed (migraine).     pantoprazole (PROTONIX) 40 MG tablet Take 1 tablet (40 mg total) by mouth daily. 90 tablet 3   valACYclovir (VALTREX) 500 MG tablet Take 1 tablet (500 mg total) by mouth daily. 90 tablet 3   zonisamide (ZONEGRAN) 100 MG capsule Take 100 mg by mouth 2 (two) times daily.     No current facility-administered medications for this visit.    Family History  Problem Relation Age of Onset   Cancer Father        bladder cancer    Review of Systems  Constitutional: Negative.   Genitourinary: Negative.     Exam:   BP (!) 158/83 (BP Location: Right Arm, Patient Position: Sitting,  Cuff Size: Large)   Pulse 63   Ht '5\' 1"'$  (1.549 m) Comment: Reported  Wt 166 lb (75.3 kg)   LMP 06/23/1990 (Approximate)   BMI 31.37 kg/m   Height: '5\' 1"'$  (154.9 cm) (Reported)  General appearance: alert, conormal appearance, no masses or tendernessoperative and appears stated age Breasts: normal appearance, no masses or tenderness Abdomen: soft, non-tender; bowel sounds normal; no masses,  no organomegaly Lymph nodes: Cervical, supraclavicular, and axillary nodes normal.  No abnormal inguinal nodes palpated Neurologic: Grossly normal  Pelvic: External genitalia:  no lesions              Urethra:  normal appearing urethra with no masses, tenderness or lesions              Bartholins and Skenes: normal                 Vagina: normal appearing vagina with atrophic changes and no discharge, no lesions, tenderness to pelvic floor and left lower abdominal well              Cervix: absent              Pap taken: No. Bimanual Exam:  Uterus:  uterus absent               Adnexa: normal adnexa               Rectovaginal: Confirms               Anus:  normal sphincter tone, no lesions  Chaperone, Octaviano Batty, CMA, was present for exam.  Assessment/Plan: 1. GYN exam for high-risk Medicare patient - Pap smear not indicated - Mammogram 06/30/2022 with f/u images.  Follow up scheduled 6 months. - Colonoscopy 06/27/2020.  Follow up 7 years. - Bone mineral density 2023 - lab work done with PCP, Dr. Demetrios Isaacs - vaccines reviewed/updated  2. BMI 31.0-31.9,adult - will refer to healthy weight and wellness.  Cost discussed.  Basic program overview discussed - Ambulatory referral to Family Practice - TSH - Hemoglobin A1c  3. LLQ pain - US PELVIS TRANSVAGINAL NON-OB (TV ONLY); Future  4. Pelvic floor dysfunction - Ambulatory referral to Physical Therapy  5. History of DVT of lower extremity   6. History of herpes simplex infection  7.  Prediabetes - will recheck hba1c today

## 2022-08-18 NOTE — Patient Instructions (Signed)
Call [phone]

## 2022-08-19 ENCOUNTER — Encounter (HOSPITAL_BASED_OUTPATIENT_CLINIC_OR_DEPARTMENT_OTHER): Payer: Self-pay | Admitting: Obstetrics & Gynecology

## 2022-08-19 LAB — HEMOGLOBIN A1C
Est. average glucose Bld gHb Est-mCnc: 120 mg/dL
Hgb A1c MFr Bld: 5.8 % — ABNORMAL HIGH (ref 4.8–5.6)

## 2022-08-19 LAB — TSH: TSH: 2.38 u[IU]/mL (ref 0.450–4.500)

## 2022-09-01 ENCOUNTER — Ambulatory Visit (INDEPENDENT_AMBULATORY_CARE_PROVIDER_SITE_OTHER): Payer: PPO | Admitting: Internal Medicine

## 2022-09-01 VITALS — BP 130/85 | HR 70 | Temp 97.6°F | Ht 60.0 in | Wt 159.0 lb

## 2022-09-01 DIAGNOSIS — N200 Calculus of kidney: Secondary | ICD-10-CM | POA: Diagnosis not present

## 2022-09-01 DIAGNOSIS — Z0289 Encounter for other administrative examinations: Secondary | ICD-10-CM

## 2022-09-01 DIAGNOSIS — E785 Hyperlipidemia, unspecified: Secondary | ICD-10-CM

## 2022-09-01 DIAGNOSIS — E669 Obesity, unspecified: Secondary | ICD-10-CM

## 2022-09-01 DIAGNOSIS — Z6831 Body mass index (BMI) 31.0-31.9, adult: Secondary | ICD-10-CM

## 2022-09-01 NOTE — Assessment & Plan Note (Signed)
Patient mentions having a history of recurrent kidney stones and of sepsis.  I shared with her that zonisamide may cause kidney stones.She has been on this medication for many years for migraine prophylaxis.  She will review medication with prescribing physician.  I also counseled her on maintaining adequate hydration and also the association between kidney stones and simple carbs.

## 2022-09-01 NOTE — Assessment & Plan Note (Signed)
LDL is not at goal. Elevated LDL may be secondary to nutrition, genetics and spillover effect from excess adiposity. Recommended LDL goal is <70 to reduce the risk of fatty streaks and the progression to obstructive ASCVD in the future. Her 10 year risk is: The 10-year ASCVD risk score (Arnett DK, et al., 2019) is: 8.8%  Lab Results  Component Value Date   CHOL 208 (H) 04/23/2022   HDL 58 04/23/2022   LDLCALC 107 (H) 04/23/2022   TRIG 253 (H) 04/23/2022   CHOLHDL 3.6 04/23/2022   She is currently on atorvastatin 20 mg a day without any adverse effects.  Reducing simple added sugars from diet will improve triglycerides. Continue move forward with weight loss therapy, losing 10% or more of body weight may improve condition.  Continue statin therapy

## 2022-09-01 NOTE — Assessment & Plan Note (Signed)
We reviewed weight, biometrics, associated medical conditions and contributing factors with patient. She would benefit from weight loss therapy via a modified calorie, low-carb, high-protein nutritional plan tailored to their REE (resting energy expenditure) which will be determined by indirect calorimetry.  We will also assess for cardiometabolic risk and nutritional derangements via fasting serologies at her next appointment. 

## 2022-09-01 NOTE — Progress Notes (Signed)
Office: [phone]  /  Fax: [phone]   Initial Visit  Carol [name] was seen in clinic today to evaluate for obesity. She is interested in losing weight to improve overall health and reduce the risk of weight related complications. She presents today to review program treatment options, initial physical assessment, and evaluation. Has been working on weight loss last year.  She has been finding it very difficult and does not know what else to do.  She was referred by: Specialist Dr. Janeann Merl  When asked what else they would like to accomplish? She states: Improve existing medical conditions  Weight history:  When asked how has your weight affected you? She states: Contributed to medical problems  Some associated conditions: Hyperlipidemia, Prediabetes, Kidney disease, and Other: recurrent kidney stones  Contributing factors:   Weight promoting medications identified: None  Current nutrition plan: None  Current level of physical activity: Walking  Current or previous pharmacotherapy: None  Response to medication: Never tried medications   Past medical history includes:   Past Medical History:  Diagnosis Date   Abnormal Pap smear of cervix    in 20's   Anxiety    claustrophobic   Arthritis    "neck, hands" (09/02/2013)   Basal cell carcinoma of forehead 2008; 2014   Dizziness last occurance 10-15-2014   on occasion, not related to activity   DVT (deep venous thrombosis) ([place]) 06/28/2014   [facility] [employer], right leg, behind knee   Environmental allergies    Exercise-induced asthma 30's   GERD (gastroesophageal reflux disease)    H/O hiatal hernia    Herpes simplex 1976   History of colonic polyps 05/21/2016   History of kidney stones    Hyperlipidemia    Migraine teens, returned in 70"s   "depends on the change in the weather; last one was 1 wk ago" (09/02/2013)   Plantar fasciitis    PONV (postoperative nausea and vomiting) 3/ 2015   ponv after  gallblaffer   Right ureteral stone    Seasonal allergic rhinitis    Tubular adenoma of colon 05/22/2017   per path report.    Tubular adenoma of colon 06/29/2020     Objective:   BP 130/85   Pulse 70   Temp 97.6 F (36.4 C)   Ht 5' (1.524 m)   Wt 159 lb (72.1 kg)   LMP 06/23/1990 (Approximate)   SpO2 97%   BMI 31.05 kg/m  She was weighed on the bioimpedance scale: Body mass index is 31.05 kg/m.  Peak Weight: 172, Body Fat%: 41, Visceral Fat Rating: 12, Weight trend over the last 12 months: Unchanged  General:  Alert, oriented and cooperative. Patient is in no acute distress.  Respiratory: Normal respiratory effort, no problems with respiration noted   Gait: able to ambulate independently  Mental Status: Normal mood and affect. Normal behavior. Normal judgment and thought content.   DIAGNOSTIC DATA REVIEWED:  BMET    Component Value Date/Time   NA 145 (H) 04/23/2022 1331   K 5.1 04/23/2022 1331   CL 108 (H) 04/23/2022 1331   CO2 23 04/23/2022 1331   GLUCOSE 102 (H) 04/23/2022 1331   GLUCOSE 143 (H) 05/07/2021 0442   BUN 21 04/23/2022 1331   CREATININE 0.95 04/23/2022 1331   CREATININE 0.76 03/16/2017 1354   CALCIUM 9.6 04/23/2022 1331   GFRNONAA >60 05/07/2021 0442   GFRAA 79 10/21/2019 1326   Lab Results  Component Value Date   HGBA1C 5.8 (H) 08/18/2022  HGBA1C 5.6 02/22/2013   No results found for: "INSULIN" CBC    Component Value Date/Time   WBC 7.4 04/23/2022 1331   WBC 14.2 (H) 05/07/2021 0446   RBC 4.76 04/23/2022 1331   RBC 3.78 (L) 05/07/2021 0446   HGB 14.2 04/23/2022 1331   HGB 14.3 05/21/2016 1128   HCT 42.1 04/23/2022 1331   PLT 307 04/23/2022 1331   MCV 88 04/23/2022 1331   MCH 29.8 04/23/2022 1331   MCH 29.1 05/07/2021 0446   MCHC 33.7 04/23/2022 1331   MCHC 33.3 05/07/2021 0446   RDW 12.8 04/23/2022 1331   Iron/TIBC/Ferritin/ %Sat No results found for: "IRON", "TIBC", "FERRITIN", "IRONPCTSAT" Lipid Panel     Component Value  Date/Time   CHOL 208 (H) 04/23/2022 1331   TRIG 253 (H) 04/23/2022 1331   HDL 58 04/23/2022 1331   CHOLHDL 3.6 04/23/2022 1331   CHOLHDL 3.2 05/21/2016 1407   VLDL 44 (H) 05/21/2016 1407   LDLCALC 107 (H) 04/23/2022 1331   Hepatic Function Panel     Component Value Date/Time   PROT 7.0 04/23/2022 1331   ALBUMIN 4.8 04/23/2022 1331   AST 21 04/23/2022 1331   ALT 24 04/23/2022 1331   ALKPHOS 102 04/23/2022 1331   BILITOT 0.2 04/23/2022 1331      Component Value Date/Time   TSH 2.380 08/18/2022 1310     Assessment and Plan:   Hyperlipidemia with target LDL less than 100 Assessment & Plan: LDL is not at goal. Elevated LDL may be secondary to nutrition, genetics and spillover effect from excess adiposity. Recommended LDL goal is <70 to reduce the risk of fatty streaks and the progression to obstructive ASCVD in the future. Her 10 year risk is: The 10-year ASCVD risk score (Arnett DK, et al., 2019) is: 8.8%  Lab Results  Component Value Date   CHOL 208 (H) 04/23/2022   HDL 58 04/23/2022   LDLCALC 107 (H) 04/23/2022   TRIG 253 (H) 04/23/2022   CHOLHDL 3.6 04/23/2022   She is currently on atorvastatin 20 mg a day without any adverse effects.  Reducing simple added sugars from diet will improve triglycerides. Continue move forward with weight loss therapy, losing 10% or more of body weight may improve condition.  Continue statin therapy      Class 1 obesity with serious comorbidity and body mass index (BMI) of 31.0 to 31.9 in adult, unspecified obesity type Assessment & Plan: We reviewed weight, biometrics, associated medical conditions and contributing factors with patient. She would benefit from weight loss therapy via a modified calorie, low-carb, high-protein nutritional plan tailored to their REE (resting energy expenditure) which will be determined by indirect calorimetry.  We will also assess for cardiometabolic risk and nutritional derangements via fasting serologies at  her next appointment.   Recurrent kidney stones Assessment & Plan: Patient mentions having a history of recurrent kidney stones and of sepsis.  I shared with her that zonisamide may cause kidney stones.She has been on this medication for many years for migraine prophylaxis.  She will review medication with prescribing physician.  I also counseled her on maintaining adequate hydration and also the association between kidney stones and simple carbs.         Obesity Treatment / Action Plan:  Patient will work on garnering support from family and friends to begin weight loss journey. Will work on eliminating or reducing the presence of highly palatable, calorie dense foods in the home. Will complete provided nutritional and psychosocial assessment  questionnaire before the next appointment. Will be scheduled for indirect calorimetry to determine resting energy expenditure in a fasting state.  This will allow [place] to create a reduced calorie, high-protein meal plan to promote loss of fat mass while preserving muscle mass. Counseled on the health benefits of losing 5%-15% of total body weight. Was counseled on nutritional approaches to weight loss and benefits of complex carbs and high quality protein as part of nutritional weight management. Was counseled on pharmacotherapy and role as an adjunct in weight management.   Obesity Education Performed Today:  She was weighed on the bioimpedance scale and results were discussed and documented in the synopsis.  We discussed obesity as a disease and the importance of a more detailed evaluation of all the factors contributing to the disease.  We discussed the importance of long term lifestyle changes which include nutrition, exercise and behavioral modifications as well as the importance of customizing this to her specific health and social needs.  We discussed the benefits of reaching a healthier weight to alleviate the symptoms of existing conditions  and reduce the risks of the biomechanical, metabolic and psychological effects of obesity.  Carol [name] appears to be in the action stage of change and states they are ready to start intensive lifestyle modifications and behavioral modifications.  30 minutes was spent today on this visit including the above counseling, pre-visit chart review, and post-visit documentation.  Reviewed by clinician on day of visit: allergies, medications, problem list, medical history, surgical history, family history, social history, and previous encounter notes pertinent to obesity diagnosis.   Thomes Dinning, MD

## 2022-09-16 ENCOUNTER — Ambulatory Visit (INDEPENDENT_AMBULATORY_CARE_PROVIDER_SITE_OTHER): Payer: PPO | Admitting: Internal Medicine

## 2022-09-16 ENCOUNTER — Encounter (INDEPENDENT_AMBULATORY_CARE_PROVIDER_SITE_OTHER): Payer: Self-pay | Admitting: Internal Medicine

## 2022-09-16 VITALS — BP 120/80 | HR 83 | Temp 97.7°F | Ht 60.0 in | Wt 159.0 lb

## 2022-09-16 DIAGNOSIS — Z6831 Body mass index (BMI) 31.0-31.9, adult: Secondary | ICD-10-CM

## 2022-09-16 DIAGNOSIS — Z1331 Encounter for screening for depression: Secondary | ICD-10-CM | POA: Diagnosis not present

## 2022-09-16 DIAGNOSIS — R7303 Prediabetes: Secondary | ICD-10-CM

## 2022-09-16 DIAGNOSIS — E669 Obesity, unspecified: Secondary | ICD-10-CM

## 2022-09-16 DIAGNOSIS — R0602 Shortness of breath: Secondary | ICD-10-CM | POA: Diagnosis not present

## 2022-09-16 DIAGNOSIS — R5383 Other fatigue: Secondary | ICD-10-CM | POA: Insufficient documentation

## 2022-09-16 DIAGNOSIS — E785 Hyperlipidemia, unspecified: Secondary | ICD-10-CM | POA: Diagnosis not present

## 2022-09-16 NOTE — Assessment & Plan Note (Signed)
She has a mildly elevated LDL cholesterol and also triglycerides. Recommended LDL goal is <70 to reduce the risk of fatty streaks and the progression to obstructive ASCVD in the future. Her 10 year risk is: The 10-year ASCVD risk score (Arnett DK, et al., 2019) is: 7.6%.    Lab Results  Component Value Date   CHOL 208 (H) 04/23/2022   HDL 58 04/23/2022   LDLCALC 107 (H) 04/23/2022   TRIG 253 (H) 04/23/2022   CHOLHDL 3.6 04/23/2022   She is currently on atorvastatin 20 mg a day without any adverse effects.  Reducing simple added sugars from diet will improve triglycerides.  We are checking a fasting lipid profile today.  She will begin reduced calorie nutrition plan which will help reduce saturated fats in diet.  Continue statin therapy losing 10% of body weight may improve condition

## 2022-09-16 NOTE — Progress Notes (Signed)
Chief Complaint:   Carol [name] (MR# [id number]) is a 72 y.o. female who presents for evaluation and treatment of obesity and related comorbidities. Current BMI is Body mass index is 31.05 kg/m. Carol [name] has been struggling with her weight for many years and has been unsuccessful in either losing weight, maintaining weight loss, or reaching her healthy weight goal.  Carol [name] is currently in the action stage of change and ready to dedicate time achieving and maintaining a healthier weight. Carol [name] is interested in becoming our patient and working on intensive lifestyle modifications including (but not limited to) diet and exercise for weight loss.  Carol [name]'s habits were reviewed today and are as follows: Her family eats meals together, she thinks her family will eat healthier with her, her desired weight loss is 39, she started gaining weight when she turned 52, her heaviest weight ever was 172 pounds, she is frequently drinking liquids with calories, she frequently makes poor food choices, she has problems with excessive hunger, and she frequently eats larger portions than normal.  Depression Screen Carol [name]'s Food and Mood (modified PHQ-9) score was 10.  Subjective:   1. Other fatigue Carol [name] admits to daytime somnolence and admits to waking up still tired. Patient has a history of symptoms of daytime fatigue, morning fatigue, and morning headache. Carol [name] generally gets 4 or 5 hours of sleep per night, and states that she has nightime awakenings. Snoring is not present. Apneic episodes are not present. Epworth Sleepiness Score is 10.   2. SOB (shortness of breath) on exertion Carol [name] notes increasing shortness of breath with exercising and seems to be worsening over time with weight gain. She notes getting out of breath sooner with activity than she used to. This has not gotten worse recently. Carol [name] denies shortness of breath at rest or orthopnea.  3. Hyperlipidemia with target LDL  less than 100 She has a mildly elevated LDL cholesterol and also triglycerides. Recommended LDL goal is <70 to reduce the risk of fatty streaks and the progression to obstructive ASCVD in the future. Her 10 year risk is: The 10-year ASCVD risk score (Arnett DK, et al., 2019) is: 7.6%.    Lab Results  Component Value Date   CHOL 208 (H) 04/23/2022   HDL 58 04/23/2022   LDLCALC 107 (H) 04/23/2022   TRIG 253 (H) 04/23/2022   CHOLHDL 3.6 04/23/2022   She is currently on atorvastatin 20 mg a day without any adverse effects.  Reducing simple added sugars from diet will improve triglycerides.  4. Prediabetes Most recent A1c is  Lab Results  Component Value Date   HGBA1C 5.8 (H) 08/18/2022   HGBA1C 5.6 02/22/2013  . Patient informed of disease state and risk of progression. This may contribute to abnormal cravings, fatigue and diabetes complications without having diabetes.   Assessment/Plan:   1. Other fatigue Carol [name] does feel that her weight is causing her energy to be lower than it should be. Fatigue may be related to obesity, depression or many other causes. Labs will be ordered, and in the meanwhile, Carol [name] will focus on self care including making healthy food choices, increasing physical activity and focusing on stress reduction.  - EKG 12-Lead - Vitamin B12  2. SOB (shortness of breath) on exertion Carol [name] does feel that she gets out of breath more easily that she used to when she exercises. Carol [name]'s shortness of breath appears to be obesity related and exercise induced. She has agreed to work on weight loss  and gradually increase exercise to treat her exercise induced shortness of breath. Will continue to monitor closely.  3. Hyperlipidemia with target LDL less than 100 We are checking a fasting lipid profile today.  She will begin reduced calorie nutrition plan which will help reduce saturated fats in diet.  Continue statin therapy losing 10% of body weight may improve condition.  4.  Prediabetes We reviewed treatment options which include losing 7 to 10% of body weight, increasing physical activity to a 150 minutes a week of moderate intensity.She may also be a candidate for pharmacoprophylaxis with metformin or incretin mimetic.   - Comprehensive metabolic panel - Insulin, random - Lipid Panel With LDL/HDL Ratio  5. Depression screening Carol [name] had a positive depression screening. Depression is commonly associated with obesity and often results in emotional eating behaviors. We will monitor this closely and work on CBT to help improve the non-hunger eating patterns. Referral to Psychology may be required if no improvement is seen as she continues in our clinic.  6. Class 1 obesity with serious comorbidity and body mass index (BMI) of 31.0 to 31.9 in adult, unspecified obesity type Carol [name] is currently in the action stage of change and her goal is to continue with weight loss efforts. I recommend Carol [name] begin the structured treatment plan as follows:  She has agreed to the Category 1 Plan.  Exercise goals: All adults should avoid inactivity. Some physical activity is better than none, and adults who participate in any amount of physical activity gain some health benefits.   Behavioral modification strategies: increasing lean protein intake, decreasing simple carbohydrates, increasing vegetables, increasing water intake, decreasing liquid calories, increasing high fiber foods, no skipping meals, meal planning and cooking strategies, keeping healthy foods in the home, better snacking choices, and planning for success.  She was informed of the importance of frequent follow-up visits to maximize her success with intensive lifestyle modifications for her multiple health conditions. She was informed we would discuss her lab results at her next visit unless there is a critical issue that needs to be addressed sooner. Carol [name] agreed to keep her next visit at the agreed upon time to discuss  these results.  Objective:   Blood pressure 120/80, pulse 83, temperature 97.7 F (36.5 C), height 5' (1.524 m), weight 159 lb (72.1 kg), last menstrual period 06/23/1990, SpO2 97 %. Body mass index is 31.05 kg/m.  EKG: Normal sinus rhythm, rate 89 BPM.  Indirect Calorimeter completed today shows a VO2 of 181 and a REE of 1253.  Her calculated basal metabolic rate is [id number] thus her basal metabolic rate is worse than expected.  General: Cooperative, alert, well developed, in no acute distress. HEENT: Conjunctivae and lids unremarkable. Cardiovascular: Regular rhythm.  Lungs: Normal work of breathing. Neurologic: No focal deficits.   Lab Results  Component Value Date   CREATININE 0.95 09/16/2022   BUN 22 09/16/2022   NA 142 09/16/2022   K 4.5 09/16/2022   CL 105 09/16/2022   CO2 22 09/16/2022   Lab Results  Component Value Date   ALT 25 09/16/2022   AST 24 09/16/2022   ALKPHOS 103 09/16/2022   BILITOT 0.2 09/16/2022   Lab Results  Component Value Date   HGBA1C 5.8 (H) 08/18/2022   HGBA1C 5.8 (H) 05/03/2021   HGBA1C 5.6 02/22/2013   Lab Results  Component Value Date   INSULIN 11.1 09/16/2022   Lab Results  Component Value Date   TSH 2.380 08/18/2022   Lab Results  Component Value Date   CHOL 203 (H) 09/16/2022   HDL 59 09/16/2022   LDLCALC 113 (H) 09/16/2022   TRIG 181 (H) 09/16/2022   CHOLHDL 3.6 04/23/2022   Lab Results  Component Value Date   WBC 7.4 04/23/2022   HGB 14.2 04/23/2022   HCT 42.1 04/23/2022   MCV 88 04/23/2022   PLT 307 04/23/2022   No results found for: "IRON", "TIBC", "FERRITIN"  Attestation Statements:   Reviewed by clinician on day of visit: allergies, medications, problem list, medical history, surgical history, family history, social history, and previous encounter notes.  Time spent on visit including pre-visit chart review and post-visit charting and care was 40 minutes.   Wilhemena Durie, am acting as transcriptionist for  Thomes Dinning, MD.  I have reviewed the above documentation for accuracy and completeness, and I agree with the above. -Thomes Dinning, MD

## 2022-09-16 NOTE — Assessment & Plan Note (Signed)
Most recent A1c is  Lab Results  Component Value Date   HGBA1C 5.8 (H) 08/18/2022   HGBA1C 5.6 02/22/2013  . Patient informed of disease state and risk of progression. This may contribute to abnormal cravings, fatigue and diabetes complications without having diabetes.   We reviewed treatment options which include losing 7 to 10% of body weight, increasing physical activity to a 150 minutes a week of moderate intensity.She may also be a candidate for pharmacoprophylaxis with metformin or incretin mimetic.

## 2022-09-17 LAB — COMPREHENSIVE METABOLIC PANEL
ALT: 25 IU/L (ref 0–32)
AST: 24 IU/L (ref 0–40)
Albumin/Globulin Ratio: 1.8 (ref 1.2–2.2)
Albumin: 4.8 g/dL (ref 3.9–4.9)
Alkaline Phosphatase: 103 IU/L (ref 44–121)
BUN/Creatinine Ratio: 23 (ref 12–28)
BUN: 22 mg/dL (ref 8–27)
Bilirubin Total: 0.2 mg/dL (ref 0.0–1.2)
CO2: 22 mmol/L (ref 20–29)
Calcium: 9.5 mg/dL (ref 8.7–10.3)
Chloride: 105 mmol/L (ref 96–106)
Creatinine, Ser: 0.95 mg/dL (ref 0.57–1.00)
Globulin, Total: 2.6 g/dL (ref 1.5–4.5)
Glucose: 107 mg/dL — ABNORMAL HIGH (ref 70–99)
Potassium: 4.5 mmol/L (ref 3.5–5.2)
Sodium: 142 mmol/L (ref 134–144)
Total Protein: 7.4 g/dL (ref 6.0–8.5)
eGFR: 65 mL/min/{1.73_m2} (ref >59)

## 2022-09-17 LAB — LIPID PANEL WITH LDL/HDL RATIO
Cholesterol, Total: 203 mg/dL — ABNORMAL HIGH (ref 100–199)
HDL: 59 mg/dL (ref >39)
LDL Chol Calc (NIH): 113 mg/dL — ABNORMAL HIGH (ref 0–99)
LDL/HDL Ratio: 1.9 ratio (ref 0.0–3.2)
Triglycerides: 181 mg/dL — ABNORMAL HIGH (ref 0–149)
VLDL Cholesterol Cal: 31 mg/dL (ref 5–40)

## 2022-09-17 LAB — INSULIN, RANDOM: INSULIN: 11.1 u[IU]/mL (ref 2.6–24.9)

## 2022-09-17 LAB — VITAMIN B12: Vitamin B-12: 709 pg/mL (ref 232–1245)

## 2022-09-22 ENCOUNTER — Ambulatory Visit: Payer: PPO | Admitting: Physical Therapy

## 2022-09-23 DIAGNOSIS — N135 Crossing vessel and stricture of ureter without hydronephrosis: Secondary | ICD-10-CM | POA: Diagnosis not present

## 2022-09-23 DIAGNOSIS — N3020 Other chronic cystitis without hematuria: Secondary | ICD-10-CM | POA: Diagnosis not present

## 2022-09-23 DIAGNOSIS — N202 Calculus of kidney with calculus of ureter: Secondary | ICD-10-CM | POA: Diagnosis not present

## 2022-09-30 ENCOUNTER — Encounter (INDEPENDENT_AMBULATORY_CARE_PROVIDER_SITE_OTHER): Payer: Self-pay | Admitting: Internal Medicine

## 2022-09-30 ENCOUNTER — Ambulatory Visit (INDEPENDENT_AMBULATORY_CARE_PROVIDER_SITE_OTHER): Payer: PPO | Admitting: Internal Medicine

## 2022-09-30 VITALS — BP 122/84 | HR 79 | Temp 97.8°F | Ht 60.0 in | Wt 156.0 lb

## 2022-09-30 DIAGNOSIS — E785 Hyperlipidemia, unspecified: Secondary | ICD-10-CM | POA: Diagnosis not present

## 2022-09-30 DIAGNOSIS — R7303 Prediabetes: Secondary | ICD-10-CM | POA: Diagnosis not present

## 2022-09-30 DIAGNOSIS — E669 Obesity, unspecified: Secondary | ICD-10-CM | POA: Diagnosis not present

## 2022-09-30 DIAGNOSIS — Z9189 Other specified personal risk factors, not elsewhere classified: Secondary | ICD-10-CM | POA: Insufficient documentation

## 2022-09-30 DIAGNOSIS — Z0000 Encounter for general adult medical examination without abnormal findings: Secondary | ICD-10-CM | POA: Insufficient documentation

## 2022-09-30 DIAGNOSIS — Z6831 Body mass index (BMI) 31.0-31.9, adult: Secondary | ICD-10-CM

## 2022-09-30 NOTE — Progress Notes (Signed)
Office: [phone]  /  Fax: [phone]  WEIGHT SUMMARY AND BIOMETRICS  Vitals Temp: 97.8 F (36.6 C) BP: 122/84 Pulse Rate: 79 SpO2: 98 %   Anthropometric Measurements Height: 5' (1.524 m) Weight: 156 lb (70.8 kg) BMI (Calculated): 30.47 Weight at Last Visit: 159 lb Weight Lost Since Last Visit: 3 lb Starting Weight: 159 lb Peak Weight: 172 lb   Body Composition  Body Fat %: 40.9 % Fat Mass (lbs): 64.2 lbs Muscle Mass (lbs): 87.8 lbs Total Body Water (lbs): 59 lbs Visceral Fat Rating : 12   The 10-year ASCVD risk score (Arnett DK, et al., 2019) is: 7.7%  HPI  Chief Complaint: OBESITY  Carol [name] is here to discuss her progress with her obesity treatment plan. She is on the the Category 1 Plan and states she is following her eating plan approximately 99 % of the time. She states she is exercising 20-25 minutes 4 times per week.  Interval History:  Since last office visit she has 3 lbs. She reports excellent adherence to reduced calorie nutritional plan. She has been working on not skipping meals, increasing protein at every meal, eating more fruits, eating more vegetables, drinking more water, making healthier choices, begun to exercise, and working on meal prepping Denies problems with appetite and hunger signals.  Denies problems with satiety and satiation.  Denies problems with eating patterns and portion control.  Denies abnormal cravings. Denies feeling deprived or restricted.   Barriers identified:  None, boredom with breakfast foods .   Pharmacotherapy for weight loss: She is currently taking no anti-obesity medication.    ASSESSMENT AND PLAN  TREATMENT PLAN FOR OBESITY:  Recommended Dietary Goals  Carol [name] is currently in the action stage of change. As such, her goal is to continue weight management plan. She has agreed to: continue current plan  Behavioral Intervention  We discussed the following Behavioral Modification Strategies today:  increasing lean protein intake, increasing vegetables, avoiding skipping meals, increasing water intake, work on meal planning and preparation, keeping healthy foods at home, and provided with other breakfast options .  Additional resources provided today:  Patient was provided with other breakfast options  Recommended Physical Activity Goals  Carol [name] has been advised to work up to 150 minutes of moderate intensity aerobic activity a week and strengthening exercises 2-3 times per week for cardiovascular health, weight loss maintenance and preservation of muscle mass.   She has agreed to :  Think about ways to increase physical activity  Pharmacotherapy We discussed various medication options to help Carol [name] with her weight loss efforts and we both agreed to : continue with nutritional and behavioral strategies  ASSOCIATED CONDITIONS ADDRESSED TODAY  Prediabetes Assessment & Plan: Most recent A1c is  Lab Results  Component Value Date   HGBA1C 5.8 (H) 08/18/2022   HGBA1C 5.6 02/22/2013  . Patient informed of disease state and risk of progression. This may contribute to abnormal cravings, fatigue and diabetes complications without having diabetes.   We reviewed treatment options which include losing 7 to 10% of body weight, increasing physical activity to a 150 minutes a week of moderate intensity.She may also be a candidate for pharmacoprophylaxis with metformin or incretin mimetic.     Class 1 obesity with serious comorbidity and body mass index (BMI) of 31.0 to 31.9 in adult, unspecified obesity type  Cardiovascular risk factor Assessment & Plan: The 10-year ASCVD risk score (Arnett DK, et al., 2019) is: 7.7%  Patient counseled on cardiovascular risk.  She is  currently on atorvastatin 20 mg once a day.  She may benefit from further lipid lowering but will continue to work with nutrition and behavioral strategies for now.   Hyperlipidemia with target LDL less than 100 Assessment &  Plan: LDL is not at goal. Elevated LDL may be secondary to nutrition, genetics and spillover effect from excess adiposity. Recommended LDL goal is <70 to reduce the risk of fatty streaks and the progression to obstructive ASCVD in the future. Her 10 year risk is: The 10-year ASCVD risk score (Arnett DK, et al., 2019) is: 7.7%.  She is currently on atorvastatin 20 mg once a day  Lab Results  Component Value Date   CHOL 203 (H) 09/16/2022   HDL 59 09/16/2022   LDLCALC 113 (H) 09/16/2022   TRIG 181 (H) 09/16/2022   CHOLHDL 3.6 04/23/2022    Continue weight loss therapy, losing 10% or more of body weight may improve condition. Also advised to reduce saturated fats in diet to less than 10% of daily calories.  Continue statin therapy at current dose.      Encounter for preventive care Assessment & Plan: Patient counseled on cardiovascular risk and she is up-to-date on age and gender specific cancer preventive screenings.      PHYSICAL EXAM:  Blood pressure 122/84, pulse 79, temperature 97.8 F (36.6 C), height 5' (1.524 m), weight 156 lb (70.8 kg), last menstrual period 06/23/1990, SpO2 98 %. Body mass index is 30.47 kg/m.  General: She is overweight, cooperative, alert, well developed, and in no acute distress. PSYCH: Has normal mood, affect and thought process.   HEENT: EOMI, sclerae are anicteric. Lungs: Normal breathing effort, no conversational dyspnea. Extremities: No edema.  Neurologic: No gross sensory or motor deficits. No tremors or fasciculations noted.    DIAGNOSTIC DATA REVIEWED:  BMET    Component Value Date/Time   NA 142 09/16/2022 0933   K 4.5 09/16/2022 0933   CL 105 09/16/2022 0933   CO2 22 09/16/2022 0933   GLUCOSE 107 (H) 09/16/2022 0933   GLUCOSE 143 (H) 05/07/2021 0442   BUN 22 09/16/2022 0933   CREATININE 0.95 09/16/2022 0933   CREATININE 0.76 03/16/2017 1354   CALCIUM 9.5 09/16/2022 0933   GFRNONAA >60 05/07/2021 0442   GFRAA 79 10/21/2019 1326    Lab Results  Component Value Date   HGBA1C 5.8 (H) 08/18/2022   HGBA1C 5.6 02/22/2013   Lab Results  Component Value Date   INSULIN 11.1 09/16/2022   Lab Results  Component Value Date   TSH 2.380 08/18/2022   CBC    Component Value Date/Time   WBC 7.4 04/23/2022 1331   WBC 14.2 (H) 05/07/2021 0446   RBC 4.76 04/23/2022 1331   RBC 3.78 (L) 05/07/2021 0446   HGB 14.2 04/23/2022 1331   HGB 14.3 05/21/2016 1128   HCT 42.1 04/23/2022 1331   PLT 307 04/23/2022 1331   MCV 88 04/23/2022 1331   MCH 29.8 04/23/2022 1331   MCH 29.1 05/07/2021 0446   MCHC 33.7 04/23/2022 1331   MCHC 33.3 05/07/2021 0446   RDW 12.8 04/23/2022 1331   Iron Studies No results found for: "IRON", "TIBC", "FERRITIN", "IRONPCTSAT" Lipid Panel     Component Value Date/Time   CHOL 203 (H) 09/16/2022 0933   TRIG 181 (H) 09/16/2022 0933   HDL 59 09/16/2022 0933   CHOLHDL 3.6 04/23/2022 1331   CHOLHDL 3.2 05/21/2016 1407   VLDL 44 (H) 05/21/2016 1407   LDLCALC 113 (H) 09/16/2022 1610  Hepatic Function Panel     Component Value Date/Time   PROT 7.4 09/16/2022 0933   ALBUMIN 4.8 09/16/2022 0933   AST 24 09/16/2022 0933   ALT 25 09/16/2022 0933   ALKPHOS 103 09/16/2022 0933   BILITOT 0.2 09/16/2022 0933      Component Value Date/Time   TSH 2.380 08/18/2022 1310   Nutritional Lab Results  Component Value Date   VD25OH 51.2 04/23/2022   VD25OH 39.4 05/27/2017   VD25OH 31 05/21/2016     Return in about 2 weeks (around 10/14/2022) for For Weight Mangement with Dr. Rikki Spearing.Marland Kitchen She was informed of the importance of frequent follow up visits to maximize her success with intensive lifestyle modifications for her multiple health conditions.   ATTESTASTION STATEMENTS:  Reviewed by clinician on day of visit: allergies, medications, problem list, medical history, surgical history, family history, social history, and previous encounter notes.     Worthy Rancher, MD

## 2022-09-30 NOTE — Assessment & Plan Note (Signed)
Patient counseled on cardiovascular risk and she is up-to-date on age and gender specific cancer preventive screenings.

## 2022-09-30 NOTE — Therapy (Unsigned)
OUTPATIENT PHYSICAL THERAPY FEMALE PELVIC EVALUATION   Patient Name: Carol [name] MRN: [id number] DOB:11/15/1950, 72 y.o., female Today's Date: 09/30/2022  END OF SESSION:   Past Medical History:  Diagnosis Date   Abnormal Pap smear of cervix    in 20's   Anxiety    claustrophobic   Arthritis    "neck, hands" (09/02/2013)   Asthma    Basal cell carcinoma of forehead 2008; 2014   Dizziness last occurance 10-15-2014   on occasion, not related to activity   DVT (deep venous thrombosis) (HCC) 06/28/2014   Dr Susann GivensLalonde, right leg, behind knee   Environmental allergies    Exercise-induced asthma 30's   Gallbladder problem    GERD (gastroesophageal reflux disease)    H/O hiatal hernia    Herpes simplex 1976   History of colonic polyps 05/21/2016   History of kidney stones    Hx of blood clots    Hyperlipidemia    IBS (irritable bowel syndrome)    Joint pain    Kidney problem    Migraine teens, returned in 40"s   "depends on the change in the weather; last one was 1 wk ago" (09/02/2013)   Obesity    Osteoarthritis    Plantar fasciitis    PONV (postoperative nausea and vomiting) 08/2013   ponv after gallblaffer   Prediabetes    Right ureteral stone    Seasonal allergic rhinitis    SOB (shortness of breath)    Tubular adenoma of colon 05/22/2017   per path report.    Tubular adenoma of colon 06/29/2020   Past Surgical History:  Procedure Laterality Date   ABDOMINAL HYSTERECTOMY  1992   CESAREAN SECTION  1981   CHOLECYSTECTOMY N/A 09/02/2013   Procedure: LAPAROSCOPIC CHOLECYSTECTOMY;  Surgeon: Shelly Rubensteinouglas A Blackman, MD;  Location: [facility];  Service: General;  Laterality: N/A;   CRYOTHERAPY     for abnormal pap smear in 20's   CYSTOSCOPY W/ URETERAL STENT PLACEMENT  03/05/2012   Procedure: CYSTOSCOPY WITH RETROGRADE PYELOGRAM/URETERAL STENT PLACEMENT;  Surgeon: Lindaann SloughMarc-Henry Nesi, MD;  Location: [facility];  Service: Urology;  Laterality: Right;    CYSTOSCOPY W/ URETERAL STENT PLACEMENT Right 01/19/2015   Procedure: CYSTOSCOPY WITH RETROGRADE PYELOGRAM/URETERAL  RIGHT STENT PLACEMENT;  Surgeon: Sebastian Acheheodore Manny, MD;  Location: [facility];  Service: Urology;  Laterality: Right;   CYSTOSCOPY WITH RETROGRADE PYELOGRAM, URETEROSCOPY AND STENT PLACEMENT Right 09/08/2014   Procedure: CYSTOSCOPY WITH RIGHT  RETROGRADE PYELOGRAM, URETEROSCOPY AND RIGHT STENT PLACEMENT;  Surgeon: Heloise PurpuraLester Borden, MD;  Location: [facility];  Service: Urology;  Laterality: Right;   CYSTOSCOPY WITH RETROGRADE PYELOGRAM, URETEROSCOPY AND STENT PLACEMENT Right 10/19/2014   Procedure: CYSTOSCOPY WITH Bilateral RETROGRADE PYELOGRAM/RIGHT URETEROSCOPY,RIGHT JJ STENT;  Surgeon: Jethro BolusSigmund Tannenbaum, MD;  Location: [facility];  Service: Urology;  Laterality: Right;   CYSTOSCOPY/RETROGRADE/URETEROSCOPY  11/18/2011   Procedure: CYSTOSCOPY/RETROGRADE/URETEROSCOPY;  Surgeon: Kathi LudwigSigmund I Tannenbaum, MD;  Location: [facility];  Service: Urology;  Laterality: Right;   CYSTOSCOPY/RETROGRADE/URETEROSCOPY/STONE EXTRACTION WITH BASKET  10/21/2011   Procedure: CYSTOSCOPY/RETROGRADE/URETEROSCOPY/STONE EXTRACTION WITH BASKET;  Surgeon: Kathi LudwigSigmund I Tannenbaum, MD;  [facility];  Service: Urology;  Laterality: Right;   CYSTOSCOPY/RETROGRADE/URETEROSCOPY/STONE EXTRACTION WITH BASKET Right 09/10/2012   Procedure: RIGHT URETEROSCOPY/STONE EXTRACTION/LASER LITHOTRIPSY/POSSIBLE DOUBLE J STENT PLACEMENT;  Surgeon: Kathi LudwigSigmund I Tannenbaum, MD;  Location: [facility];  Service: Urology;  Laterality: Right;   CYSTOSCOPY/URETEROSCOPY/HOLMIUM LASER/STENT PLACEMENT Left 05/02/2021   Procedure: CYSTOSCOPY/URETEROSCOPY/HOLMIUM LASER/STENT PLACEMENT;  Surgeon: Jerilee FieldEskridge, Matthew, MD;  Location: [facility];  Service: Urology;  Laterality: Left;   MOHS SURGERY  2008; 2014   PLANTAR FASCIA SURGERY  05/2016   POSTERIOR PINNACLE MESH SACROSPINOUS REPAIR  11-22-2010   RECURRENT ENTEROCELE   RIGHT URETEROSCOPIC STONE EXTRACTION W/  STENT PLACEMENT  08-12-2011   x2 stents   ROBOTICALLY ASSISTED LAPAROSCOPIC URETERAL RE-IMPLANTATION Right 01/19/2015   Procedure: ROBOTICALLY ASSISTED LAPAROSCOPIC URETERAL RE-IMPLANTATION RIGHT;  Surgeon: Sebastian Ache, MD;  Location: [facility];  Service: Urology;  Laterality: Right;   TUBAL LIGATION  1991   VAULT SUSPENSION (DERMAL GRAFT)/ ENTEROCELE AND RECTOCELE REPAIR  12-05-2005   VAULT PROLAPSE/ ENTERCELE/ RECTOCELE   Patient Active Problem List   Diagnosis Date Noted   Prediabetes 09/16/2022   Depression screening 09/16/2022   SOB (shortness of breath) on exertion 09/16/2022   Other fatigue 09/16/2022   Recurrent kidney stones 09/01/2022   Tremor 05/07/2021   Class 1 obesity with serious comorbidity and body mass index (BMI) of 31.0 to 31.9 in adult 05/07/2021   Thrombocytopenia 05/04/2021   History of renal stone 05/24/2020   History of DVT of lower extremity 10/23/2017   Nocturnal muscle cramps 10/23/2017   History of colonic polyps 05/21/2016   S/P laparoscopic cholecystectomy March 2015 09/03/2013   Hyperlipidemia with target LDL less than 100 06/27/2011   Herpes 12/16/2010   GERD (gastroesophageal reflux disease) 12/16/2010   HH (hiatus hernia) 12/16/2010   Hot flashes, menopausal 12/16/2010   Diverticulosis 12/16/2010   Asthma in adult, mild intermittent, uncomplicated 12/16/2010   BCE (basal cell epithelioma), 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  Rationale for Evaluation and Treatment: {HABREHAB:27488}  ONSET DATE: ***  SUBJECTIVE:                                                                                                                                                                                           SUBJECTIVE STATEMENT: *** Fluid intake: {Yes/No:304960894}   PAIN:  Are you having pain? {yes/no:20286} NPRS scale: ***/10 Pain location: {pelvic pain  location:27098}  Pain type: {type:313116} Pain description: {PAIN DESCRIPTION:21022940}   Aggravating factors: *** Relieving factors: ***  PRECAUTIONS: {Therapy precautions:24002}  WEIGHT BEARING RESTRICTIONS: {Yes ***/No:24003}  FALLS:  Has patient fallen in last 6 months? {fallsyesno:27318}  LIVING ENVIRONMENT: Lives with: {[facility] lives with:25569::"lives with their family"} Lives in: {Lives in:25570} Stairs: {opstairs:27293} Has following equipment at home: {Assistive devices:23999}  OCCUPATION: ***  PLOF: {PLOF:24004}  PATIENT GOALS: ***  PERTINENT HISTORY:  *** Sexual abuse: {Yes/No:304960894}  BOWEL MOVEMENT: Pain with bowel movement: {yes/no:20286} Type of bowel movement:{PT BM type:27100} Fully empty rectum: {  ZOX/WR:604540981} Leakage: {Yes/No:304960894} Pads: {Yes/No:304960894} Fiber supplement: {Yes/No:304960894}  URINATION: Pain with urination: {yes/no:20286} Fully empty bladder: {Yes/No:304960894} Stream: {PT urination:27102} Urgency: {Yes/No:304960894} Frequency: *** Leakage: {PT leakage:27103} Pads: {Yes/No:304960894}  INTERCOURSE: Pain with intercourse: {pain with intercourse PA:27099} Ability to have vaginal penetration:  {Yes/No:304960894} Climax: *** Marinoff Scale: ***/3  PREGNANCY: Vaginal deliveries *** Tearing {Yes***/No:304960894} C-section deliveries *** Currently pregnant {Yes***/No:304960894}  PROLAPSE: {PT prolapse:27101}   OBJECTIVE:   DIAGNOSTIC FINDINGS:  ***  PATIENT SURVEYS:  {rehab surveys:24030}  PFIQ-7 ***  COGNITION: Overall cognitive status: {cognition:24006}     SENSATION: Light touch: {intact/deficits:24005} Proprioception: {intact/deficits:24005}  MUSCLE LENGTH: Hamstrings: Right *** deg; Left *** deg Thomas test: Right *** deg; Left *** deg  LUMBAR SPECIAL TESTS:  {lumbar special test:25242}  FUNCTIONAL TESTS:  {Functional tests:24029}  GAIT: Distance walked: *** Assistive device  utilized: {Assistive devices:23999} Level of assistance: {Levels of assistance:24026} Comments: ***  POSTURE: {posture:25561}  PELVIC ALIGNMENT:  LUMBARAROM/PROM:  A/PROM A/PROM  eval  Flexion   Extension   Right lateral flexion   Left lateral flexion   Right rotation   Left rotation    (Blank rows = not tested)  LOWER EXTREMITY ROM:  {AROM/PROM:27142} ROM Right eval Left eval  Hip flexion    Hip extension    Hip abduction    Hip adduction    Hip internal rotation    Hip external rotation    Knee flexion    Knee extension    Ankle dorsiflexion    Ankle plantarflexion    Ankle inversion    Ankle eversion     (Blank rows = not tested)  LOWER EXTREMITY MMT:  MMT Right eval Left eval  Hip flexion    Hip extension    Hip abduction    Hip adduction    Hip internal rotation    Hip external rotation    Knee flexion    Knee extension    Ankle dorsiflexion    Ankle plantarflexion    Ankle inversion    Ankle eversion     PALPATION:   General  ***                External Perineal Exam ***                             Internal Pelvic Floor ***  Patient confirms identification and approves PT to assess internal pelvic floor and treatment {yes/no:20286}  PELVIC MMT:   MMT eval  Vaginal   Internal Anal Sphincter   External Anal Sphincter   Puborectalis   Diastasis Recti   (Blank rows = not tested)        TONE: ***  PROLAPSE: ***  TODAY'S TREATMENT:                                                                                                                              DATE: ***  EVAL ***  PATIENT EDUCATION:  Education details: *** Person educated: {Person educated:25204} Education method: {Education Method:25205} Education comprehension: {Education Comprehension:25206}  HOME EXERCISE PROGRAM: ***  ASSESSMENT:  CLINICAL IMPRESSION: Patient is a *** y.o. *** who was seen today for physical therapy evaluation and treatment for ***.    OBJECTIVE IMPAIRMENTS: {opptimpairments:25111}.   ACTIVITY LIMITATIONS: {activitylimitations:27494}  PARTICIPATION LIMITATIONS: {participationrestrictions:25113}  PERSONAL FACTORS: {Personal factors:25162} are also affecting patient's functional outcome.   REHAB POTENTIAL: {rehabpotential:25112}  CLINICAL DECISION MAKING: {clinical decision making:25114}  EVALUATION COMPLEXITY: {Evaluation complexity:25115}   GOALS: Goals reviewed with patient? {yes/no:20286}  SHORT TERM GOALS: Target date: ***  *** Baseline: Goal status: {GOALSTATUS:25110}  2.  *** Baseline:  Goal status: {GOALSTATUS:25110}  3.  *** Baseline:  Goal status: {GOALSTATUS:25110}  4.  *** Baseline:  Goal status: {GOALSTATUS:25110}  5.  *** Baseline:  Goal status: {GOALSTATUS:25110}  6.  *** Baseline:  Goal status: {GOALSTATUS:25110}  LONG TERM GOALS: Target date: ***  *** Baseline:  Goal status: {GOALSTATUS:25110}  2.  *** Baseline:  Goal status: {GOALSTATUS:25110}  3.  *** Baseline:  Goal status: {GOALSTATUS:25110}  4.  *** Baseline:  Goal status: {GOALSTATUS:25110}  5.  *** Baseline:  Goal status: {GOALSTATUS:25110}  6.  *** Baseline:  Goal status: {GOALSTATUS:25110}  PLAN:  PT FREQUENCY: {rehab frequency:25116}  PT DURATION: {rehab duration:25117}  PLANNED INTERVENTIONS: {rehab planned interventions:25118::"Therapeutic exercises","Therapeutic activity","Neuromuscular re-education","Balance training","Gait training","Patient/Family education","Self Care","Joint mobilization"}  PLAN FOR NEXT SESSION: ***   Brayton Caves Towanna Avery, PT 09/30/2022, 9:47 AM

## 2022-09-30 NOTE — Assessment & Plan Note (Signed)
Most recent A1c is  Lab Results  Component Value Date   HGBA1C 5.8 (H) 08/18/2022   HGBA1C 5.6 02/22/2013  . Patient informed of disease state and risk of progression. This may contribute to abnormal cravings, fatigue and diabetes complications without having diabetes.   We reviewed treatment options which include losing 7 to 10% of body weight, increasing physical activity to a 150 minutes a week of moderate intensity.She may also be a candidate for pharmacoprophylaxis with metformin or incretin mimetic.   

## 2022-09-30 NOTE — Assessment & Plan Note (Signed)
The 10-year ASCVD risk score (Arnett DK, et al., 2019) is: 7.7%  Patient counseled on cardiovascular risk.  She is currently on atorvastatin 20 mg once a day.  She may benefit from further lipid lowering but will continue to work with nutrition and behavioral strategies for now.

## 2022-09-30 NOTE — Assessment & Plan Note (Signed)
LDL is not at goal. Elevated LDL may be secondary to nutrition, genetics and spillover effect from excess adiposity. Recommended LDL goal is <70 to reduce the risk of fatty streaks and the progression to obstructive ASCVD in the future. Her 10 year risk is: The 10-year ASCVD risk score (Arnett DK, et al., 2019) is: 7.7%.  She is currently on atorvastatin 20 mg once a day  Lab Results  Component Value Date   CHOL 203 (H) 09/16/2022   HDL 59 09/16/2022   LDLCALC 113 (H) 09/16/2022   TRIG 181 (H) 09/16/2022   CHOLHDL 3.6 04/23/2022    Continue weight loss therapy, losing 10% or more of body weight may improve condition. Also advised to reduce saturated fats in diet to less than 10% of daily calories.  Continue statin therapy at current dose.

## 2022-10-01 ENCOUNTER — Other Ambulatory Visit: Payer: Self-pay

## 2022-10-01 ENCOUNTER — Ambulatory Visit: Payer: PPO | Attending: Obstetrics & Gynecology | Admitting: Physical Therapy

## 2022-10-01 ENCOUNTER — Encounter: Payer: Self-pay | Admitting: Physical Therapy

## 2022-10-01 DIAGNOSIS — M62838 Other muscle spasm: Secondary | ICD-10-CM | POA: Insufficient documentation

## 2022-10-01 DIAGNOSIS — M6281 Muscle weakness (generalized): Secondary | ICD-10-CM | POA: Insufficient documentation

## 2022-10-01 DIAGNOSIS — R279 Unspecified lack of coordination: Secondary | ICD-10-CM | POA: Diagnosis not present

## 2022-10-01 DIAGNOSIS — M6289 Other specified disorders of muscle: Secondary | ICD-10-CM | POA: Diagnosis not present

## 2022-10-08 ENCOUNTER — Ambulatory Visit (INDEPENDENT_AMBULATORY_CARE_PROVIDER_SITE_OTHER): Payer: PPO | Admitting: Obstetrics & Gynecology

## 2022-10-08 ENCOUNTER — Other Ambulatory Visit (HOSPITAL_BASED_OUTPATIENT_CLINIC_OR_DEPARTMENT_OTHER): Payer: Self-pay | Admitting: Obstetrics & Gynecology

## 2022-10-08 ENCOUNTER — Ambulatory Visit (INDEPENDENT_AMBULATORY_CARE_PROVIDER_SITE_OTHER): Payer: PPO

## 2022-10-08 ENCOUNTER — Encounter (HOSPITAL_BASED_OUTPATIENT_CLINIC_OR_DEPARTMENT_OTHER): Payer: Self-pay | Admitting: Obstetrics & Gynecology

## 2022-10-08 VITALS — BP 132/82 | HR 86 | Ht 60.0 in | Wt 160.2 lb

## 2022-10-08 DIAGNOSIS — R1032 Left lower quadrant pain: Secondary | ICD-10-CM

## 2022-10-11 NOTE — Progress Notes (Signed)
GYNECOLOGY  VISIT  CC:   ultrasound follow up, LLQ pain  HPI: 72 y.o. [id number] Married White or Caucasian female here for discussion of ultrasound done to evaluate ovaries due to LLQ pain.  Pt and spouse here and reviewed with both of them normal appearing ovaries and no other pelvic abnormalities.    She has started pelvic PT with Dwana Curd.  First visit was 10/01/2022.  Has been told she is very tight and that, hopefully, PT will make a big difference.     Past Medical History:  Diagnosis Date   Abnormal Pap smear of cervix    in 20's   Anxiety    claustrophobic   Arthritis    "neck, hands" (09/02/2013)   Asthma    Basal cell carcinoma of forehead 2008; 2014   Dizziness last occurance 10-15-2014   on occasion, not related to activity   DVT (deep venous thrombosis) 06/28/2014   Dr Susann Givens, right leg, behind knee   Environmental allergies    Exercise-induced asthma 30's   Gallbladder problem    GERD (gastroesophageal reflux disease)    H/O hiatal hernia    Herpes simplex 1976   History of colonic polyps 05/21/2016   History of kidney stones    Hx of blood clots    Hyperlipidemia    IBS (irritable bowel syndrome)    Joint pain    Kidney problem    Migraine teens, returned in 40"s   "depends on the change in the weather; last one was 1 wk ago" (09/02/2013)   Obesity    Osteoarthritis    Plantar fasciitis    PONV (postoperative nausea and vomiting) 08/2013   ponv after gallblaffer   Prediabetes    Right ureteral stone    Seasonal allergic rhinitis    SOB (shortness of breath)    Tubular adenoma of colon 05/22/2017   per path report.    Tubular adenoma of colon 06/29/2020    MEDS:   Current Outpatient Medications on File Prior to Visit  Medication Sig Dispense Refill   albuterol (PROAIR HFA) 108 (90 Base) MCG/ACT inhaler INHALE 2 PUFFS INTO THE LUNGS EVERY 6 HOURS AS NEEDED FOR WHEEZING/ SHORTNESS OF BREATH. 8.5 g 0   Apoaequorin (PREVAGEN) 10 MG CAPS Take  by mouth in the morning.     Ascorbic Acid (VITAMIN C) 1000 MG tablet Take 1,000 mg by mouth daily.     atorvastatin (LIPITOR) 20 MG tablet Take 1 tablet (20 mg total) by mouth daily. 90 tablet 3   diclofenac sodium (VOLTAREN) 1 % GEL Apply 2 g topically 4 (four) times daily.     DOXYCYCLINE HYCLATE PO Take 100 mg by mouth as needed.     ibandronate (BONIVA) 150 MG tablet Take 1 tablet (150 mg total) by mouth every 30 (thirty) days. Take in the morning with a full glass of water, on an empty stomach, and do not take anything else by mouth or lie down for the next 30 min. 3 tablet 3   methocarbamol (ROBAXIN) 500 MG tablet Take 500 mg by mouth daily as needed (migraine).     Multiple Vitamins-Minerals (CENTRUM SILVER 50+WOMEN PO) Take by mouth in the morning.     NON FORMULARY Viviscal     pantoprazole (PROTONIX) 40 MG tablet Take 1 tablet (40 mg total) by mouth daily. 90 tablet 3   Ubrogepant (UBRELVY) 50 MG TABS Take 50 mg by mouth as needed.     valACYclovir (VALTREX) 500 MG  tablet Take 1 tablet (500 mg total) by mouth daily. 90 tablet 3   zonisamide (ZONEGRAN) 100 MG capsule Take 100 mg by mouth 2 (two) times daily.     No current facility-administered medications on file prior to visit.    ALLERGIES: Iohexol, Ivp dye [iodinated contrast media], Codeine, and Onion  SH:  married, non smoker  Review of Systems  Constitutional: Negative.   Genitourinary:        Pelvic pain    PHYSICAL EXAMINATION:    BP 132/82   Pulse 86   Ht 5' (1.524 m) Comment: Reported  Wt 160 lb 3.2 oz (72.7 kg)   LMP 06/23/1990 (Approximate)   BMI 31.29 kg/m     General appearance: alert, cooperative and appears stated age   Assessment/Plan: 1. LLQ pain - pt is doing pelvic PT.  I will follow up along with progress from notes when PT is done.

## 2022-10-13 ENCOUNTER — Ambulatory Visit: Payer: PPO | Admitting: Physical Therapy

## 2022-10-13 ENCOUNTER — Encounter: Payer: Self-pay | Admitting: Physical Therapy

## 2022-10-13 DIAGNOSIS — M6281 Muscle weakness (generalized): Secondary | ICD-10-CM

## 2022-10-13 DIAGNOSIS — M62838 Other muscle spasm: Secondary | ICD-10-CM

## 2022-10-13 DIAGNOSIS — R279 Unspecified lack of coordination: Secondary | ICD-10-CM

## 2022-10-13 NOTE — Therapy (Signed)
OUTPATIENT PHYSICAL THERAPY FEMALE PELVIC TREATMENT   Patient Name: Carol [name] MRN: [id number] DOB:September 01, 1950, 72 y.o., female Today's Date: 10/13/2022  END OF SESSION:    Past Medical History:  Diagnosis Date   Abnormal Pap smear of cervix    in 20's   Anxiety    claustrophobic   Arthritis    "neck, hands" (09/02/2013)   Asthma    Basal cell carcinoma of forehead 2008; 2014   Dizziness last occurance 10-15-2014   on occasion, not related to activity   DVT (deep venous thrombosis) 06/28/2014   Dr Susann Givens, right leg, behind knee   Environmental allergies    Exercise-induced asthma 30's   Gallbladder problem    GERD (gastroesophageal reflux disease)    H/O hiatal hernia    Herpes simplex 1976   History of colonic polyps 05/21/2016   History of kidney stones    Hx of blood clots    Hyperlipidemia    IBS (irritable bowel syndrome)    Joint pain    Kidney problem    Migraine teens, returned in 40"s   "depends on the change in the weather; last one was 1 wk ago" (09/02/2013)   Obesity    Osteoarthritis    Plantar fasciitis    PONV (postoperative nausea and vomiting) 08/2013   ponv after gallblaffer   Prediabetes    Right ureteral stone    Seasonal allergic rhinitis    SOB (shortness of breath)    Tubular adenoma of colon 05/22/2017   per path report.    Tubular adenoma of colon 06/29/2020   Past Surgical History:  Procedure Laterality Date   ABDOMINAL HYSTERECTOMY  1992   CESAREAN SECTION  1981   CHOLECYSTECTOMY N/A 09/02/2013   Procedure: LAPAROSCOPIC CHOLECYSTECTOMY;  Surgeon: Shelly Rubenstein, MD;  Location: [facility];  Service: General;  Laterality: N/A;   CRYOTHERAPY     for abnormal pap smear in 20's   CYSTOSCOPY W/ URETERAL STENT PLACEMENT  03/05/2012   Procedure: CYSTOSCOPY WITH RETROGRADE PYELOGRAM/URETERAL STENT PLACEMENT;  Surgeon: Lindaann Slough, MD;  Location: [facility];  Service: Urology;  Laterality: Right;   CYSTOSCOPY W/  URETERAL STENT PLACEMENT Right 01/19/2015   Procedure: CYSTOSCOPY WITH RETROGRADE PYELOGRAM/URETERAL  RIGHT STENT PLACEMENT;  Surgeon: Sebastian Ache, MD;  Location: [facility];  Service: Urology;  Laterality: Right;   CYSTOSCOPY WITH RETROGRADE PYELOGRAM, URETEROSCOPY AND STENT PLACEMENT Right 09/08/2014   Procedure: CYSTOSCOPY WITH RIGHT  RETROGRADE PYELOGRAM, URETEROSCOPY AND RIGHT STENT PLACEMENT;  Surgeon: Heloise Purpura, MD;  Location: [facility];  Service: Urology;  Laterality: Right;   CYSTOSCOPY WITH RETROGRADE PYELOGRAM, URETEROSCOPY AND STENT PLACEMENT Right 10/19/2014   Procedure: CYSTOSCOPY WITH Bilateral RETROGRADE PYELOGRAM/RIGHT URETEROSCOPY,RIGHT JJ STENT;  Surgeon: Jethro Bolus, MD;  Location: [facility];  Service: Urology;  Laterality: Right;   CYSTOSCOPY/RETROGRADE/URETEROSCOPY  11/18/2011   Procedure: CYSTOSCOPY/RETROGRADE/URETEROSCOPY;  Surgeon: Kathi Ludwig, MD;  Location: [facility];  Service: Urology;  Laterality: Right;   CYSTOSCOPY/RETROGRADE/URETEROSCOPY/STONE EXTRACTION WITH BASKET  10/21/2011   Procedure: CYSTOSCOPY/RETROGRADE/URETEROSCOPY/STONE EXTRACTION WITH BASKET;  Surgeon: Kathi Ludwig, MD;  Location: [facility];  Service: Urology;  Laterality: Right;   CYSTOSCOPY/RETROGRADE/URETEROSCOPY/STONE EXTRACTION WITH BASKET Right 09/10/2012   Procedure: RIGHT URETEROSCOPY/STONE EXTRACTION/LASER LITHOTRIPSY/POSSIBLE DOUBLE J STENT PLACEMENT;  Surgeon: Kathi Ludwig, MD;  Location: [facility];  Service: Urology;  Laterality: Right;   CYSTOSCOPY/URETEROSCOPY/HOLMIUM LASER/STENT PLACEMENT Left 05/02/2021   Procedure: CYSTOSCOPY/URETEROSCOPY/HOLMIUM LASER/STENT PLACEMENT;  Surgeon: Jerilee Field, MD;  Location: [facility];  Service: Urology;  Laterality: Left;   MOHS SURGERY  2008; 2014   PLANTAR FASCIA SURGERY  05/2016   POSTERIOR PINNACLE MESH SACROSPINOUS REPAIR  11-22-2010   RECURRENT ENTEROCELE   RIGHT URETEROSCOPIC STONE EXTRACTION W/ STENT PLACEMENT   08-12-2011   x2 stents   ROBOTICALLY ASSISTED LAPAROSCOPIC URETERAL RE-IMPLANTATION Right 01/19/2015   Procedure: ROBOTICALLY ASSISTED LAPAROSCOPIC URETERAL RE-IMPLANTATION RIGHT;  Surgeon: Sebastian Ache, MD;  Location: [facility];  Service: Urology;  Laterality: Right;   TUBAL LIGATION  1991   VAULT SUSPENSION (DERMAL GRAFT)/ ENTEROCELE AND RECTOCELE REPAIR  12-05-2005   VAULT PROLAPSE/ ENTERCELE/ RECTOCELE   Patient Active Problem List   Diagnosis Date Noted   Cardiovascular risk factor 09/30/2022   Encounter for preventive care 09/30/2022   Prediabetes 09/16/2022   Depression screening 09/16/2022   SOB (shortness of breath) on exertion 09/16/2022   Other fatigue 09/16/2022   Recurrent kidney stones 09/01/2022   Tremor 05/07/2021   Class 1 obesity with serious comorbidity and body mass index (BMI) of 31.0 to 31.9 in adult 05/07/2021   Thrombocytopenia 05/04/2021   History of renal stone 05/24/2020   History of DVT of lower extremity 10/23/2017   Nocturnal muscle cramps 10/23/2017   History of colonic polyps 05/21/2016   S/P laparoscopic cholecystectomy March 2015 09/03/2013   Hyperlipidemia with target LDL less than 100 06/27/2011   Herpes 12/16/2010   GERD (gastroesophageal reflux disease) 12/16/2010   HH (hiatus hernia) 12/16/2010   Hot flashes, menopausal 12/16/2010   Diverticulosis 12/16/2010   Asthma in adult, mild intermittent, uncomplicated 12/16/2010   BCE (basal cell epithelioma), 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  Rationale for Evaluation and Treatment: Rehabilitation  ONSET DATE: October 2023  SUBJECTIVE:                                                                                                                                                                                           SUBJECTIVE  STATEMENT: I haven't had any pain just when touching it   Fluid intake: Yes: water    PAIN:  Are you having pain? Yes NPRS scale: 1/10 Pain location:  inguinal left  Pain type: aching and dull Pain description: intermittent   Aggravating factors: coughing, turning and lifting, touching it Relieving factors: ibuprofen  PRECAUTIONS: None  WEIGHT BEARING RESTRICTIONS: No  FALLS:  Has patient fallen in last 6 months? No  LIVING ENVIRONMENT: Lives with: lives with their spouse Lives in: House/apartment   OCCUPATION: retired  PLOF: Independent  PATIENT GOALS: pain and frequency, urge, leakage  PERTINENT HISTORY:  Hysterectomy, bladder tacked 2x Sexual abuse: No  BOWEL MOVEMENT: Pain with bowel movement: No Type of bowel movement:Type (Bristol Stool Scale) soft, Frequency daily, and Strain No Fully empty rectum: Yes:   Leakage: No Pads: No Fiber supplement:   URINATION: Pain with urination: No Fully empty bladder: No Stream: Weak Urgency: Yes:   Frequency: going every 2/hour, nocturia 2-3x Leakage: Urge to void, Walking to the bathroom, Coughing, Sneezing, and Exercise Pads: Yes: 1 am and 1 pm  INTERCOURSE: Pain with intercourse:  No   PREGNANCY: Vaginal deliveries 2 Tearing No C-section deliveries 1   PROLAPSE: None   OBJECTIVE:   DIAGNOSTIC FINDINGS:    PATIENT SURVEYS:    PFIQ-7   COGNITION: Overall cognitive status: Within functional limits for tasks assessed     SENSATION: Light touch: Appears intact Proprioception:   MUSCLE LENGTH: Hamstrings: Right 80 deg; Left 80 deg Thomas test:   LUMBAR SPECIAL TESTS:  Straight leg raise test: Negative  FUNCTIONAL TESTS:    GAIT:  Comments: WFL   POSTURE: rounded shoulders, anterior pelvic tilt, and flexed trunk   PELVIC ALIGNMENT: normal  LUMBARAROM/PROM: WFL   A/PROM A/PROM  eval  Flexion 75%  Extension   Right lateral flexion 75%  Left lateral flexion 75%  Right  rotation   Left rotation    (Blank rows = not tested)  LOWER EXTREMITY ROM: WFL   Passive ROM Right eval Left eval  Hip flexion    Hip extension    Hip abduction    Hip adduction    Hip internal rotation    Hip external rotation    Knee flexion    Knee extension    Ankle dorsiflexion    Ankle plantarflexion    Ankle inversion    Ankle eversion     (Blank rows = not tested)  LOWER EXTREMITY MMT:  MMT Right eval Left eval  Hip flexion  4/5  Hip extension    Hip abduction 4/5 4/5  Hip adduction 4/5 4/5  Hip internal rotation    Hip external rotation 4/5   Knee flexion    Knee extension    Ankle dorsiflexion    Ankle plantarflexion    Ankle inversion    Ankle eversion     PALPATION:   General  left lumbar paraspinals more tight than right, severe TTP ASIS and inguinal region                External Perineal Exam normal                             Internal Pelvic Floor tight levators with mild sensitivity slightly more on Left, contracted with inhale on deep inspiration  Patient confirms identification and approves PT to assess internal pelvic floor and treatment Yes  PELVIC MMT:   MMT eval  Vaginal 2/5 x 5 with extra time, 1 sec hold  Internal Anal Sphincter   External Anal Sphincter   Puborectalis   Diastasis Recti   (Blank rows = not tested)        TONE: High Lt>Rt  PROLAPSE: no  TODAY'S TREATMENT:  DATE: 10/13/22  NMRE: Breathing and bulging with stretches and transversus abdominus activaiton with exercises:  All stretches and exercises mentioned below as stated: - Supine Pelvic Floor Stretch  - 1 x daily - 7 x weekly - 1 sets - 3 reps - 30 sec hold - Supine Hip Internal and External Rotation  - 1 x daily - 7 x weekly - 1 sets - 10 reps - 5 sec hold - Supine Piriformis Stretch  - 1 x daily - 7 x weekly - 1 sets - 3 reps -  30 sec hold - Supine Transversus Abdominis Bracing with Double Leg Fallout  - 1 x daily - 7 x weekly - 2 sets - 10 reps - Dead Bug  - 1 x daily - 7 x weekly - 3 sets - 10 reps - Quadruped Circle Weight Shifts  - 1 x daily - 7 x weekly - 3 sets - 10 reps - Quadruped Thoracic Spine Extension  - 1 x daily - 7 x weekly - 3 sets - 10 reps  PATIENT EDUCATION:  Education details: Access Code: [email or web address] Person educated: Patient Education method: Explanation, Demonstration, Tactile cues, Verbal cues, and Handouts Education comprehension: verbalized understanding and returned demonstration  HOME EXERCISE PROGRAM: Access Code: [email or web address] URL: [email or web address] Date: 10/13/2022 Prepared by: Dwana Curd  Exercises - Supine Pelvic Floor Stretch  - 1 x daily - 7 x weekly - 1 sets - 3 reps - 30 sec hold - Supine Hip Internal and External Rotation  - 1 x daily - 7 x weekly - 1 sets - 10 reps - 5 sec hold - Supine Piriformis Stretch  - 1 x daily - 7 x weekly - 1 sets - 3 reps - 30 sec hold - Supine Transversus Abdominis Bracing with Double Leg Fallout  - 1 x daily - 7 x weekly - 2 sets - 10 reps - Dead Bug  - 1 x daily - 7 x weekly - 3 sets - 10 reps - Quadruped Circle Weight Shifts  - 1 x daily - 7 x weekly - 3 sets - 10 reps - Quadruped Thoracic Spine Extension  - 1 x daily - 7 x weekly - 3 sets - 10 reps  ASSESSMENT:  CLINICAL IMPRESSION: Today's session focused on initial HEP and breathing with stretches to work on relaxing.  There was some sensation of groin with initial deep breathing and then no sensation after that as progressing stretches and core strength.  Pt given initial HEP today.  Pt will benefit from skilled PT to address impairments and improve function and pain.  OBJECTIVE IMPAIRMENTS: decreased coordination, decreased endurance, decreased ROM, decreased strength, increased fascial restrictions, increased muscle spasms, impaired flexibility, impaired tone,  postural dysfunction, and pain.   ACTIVITY LIMITATIONS: lifting, sitting, continence, and toileting  PARTICIPATION LIMITATIONS: community activity  PERSONAL FACTORS: Time since onset of injury/illness/exacerbation and 3+ comorbidities: fell <1 year ago with broken ankle Rt; vaginal deliveries, c-section, bladder surgery, hysterectomy  are also affecting patient's functional outcome.   REHAB POTENTIAL: Excellent  CLINICAL DECISION MAKING: Evolving/moderate complexity  EVALUATION COMPLEXITY: Moderate   GOALS: Goals reviewed with patient? Yes  SHORT TERM GOALS: Target date: 10/29/22  Ind with correct muscle movement of core with breathing Baseline: Goal status: IN PROGRESS  2.  Urge techniques known Baseline:  Goal status: MET  3.  Ind with initial core strengthening Baseline:  Goal status: IN PROGRESS  4.  Pain reduced by 25% Baseline:  Goal status: IN PROGRESS  LONG TERM GOALS: Target date: 12/24/22  Pt will be independent with advanced HEP to maintain improvements made throughout therapy  Baseline:  Goal status: INITIAL  2.  Pt will report 75% reduction of pain due to improvements in posture, strength, and muscle length  Baseline:  Goal status: INITIAL  3.  Pt will have to use 0-1 pads per day  Baseline:  Goal status: INITIAL  4.  Pt will be able to functional actions such as walking to the bathroom without leakage  Baseline:  Goal status: INITIAL  5.  Pt will have 50% less urgency due to bladder retraining and strengthening  Baseline:  Goal status: INITIAL  6.  Pt will have nocturia decreased to 1/night at most  Baseline: 4 x last night  Goal status: INITIAL  PLAN:  PT FREQUENCY: 1x/week  PT DURATION: 12 weeks  PLANNED INTERVENTIONS: Therapeutic exercises, Therapeutic activity, Neuromuscular re-education, Balance training, Gait training, Patient/Family education, Self Care, Joint mobilization, Dry Needling, Electrical stimulation, Cryotherapy, Moist  heat, Taping, Biofeedback, Manual therapy, and Re-evaluation  PLAN FOR NEXT SESSION: coordinate diaphragm with core and kegel, internal STM and TC to inhale and bulge pelvic floor or maybe use RUSI for pelvic floor, STM if still having some pain, lumbar STM   Brayton Caves Shunda Rabadi, PT 10/13/2022, 8:04 AM

## 2022-10-22 ENCOUNTER — Encounter (INDEPENDENT_AMBULATORY_CARE_PROVIDER_SITE_OTHER): Payer: Self-pay | Admitting: Internal Medicine

## 2022-10-22 ENCOUNTER — Ambulatory Visit (INDEPENDENT_AMBULATORY_CARE_PROVIDER_SITE_OTHER): Payer: PPO | Admitting: Internal Medicine

## 2022-10-22 VITALS — BP 129/79 | HR 72 | Temp 98.0°F | Ht 60.0 in | Wt 154.0 lb

## 2022-10-22 DIAGNOSIS — L603 Nail dystrophy: Secondary | ICD-10-CM | POA: Insufficient documentation

## 2022-10-22 DIAGNOSIS — Z6831 Body mass index (BMI) 31.0-31.9, adult: Secondary | ICD-10-CM

## 2022-10-22 DIAGNOSIS — Z9189 Other specified personal risk factors, not elsewhere classified: Secondary | ICD-10-CM

## 2022-10-22 DIAGNOSIS — R7303 Prediabetes: Secondary | ICD-10-CM | POA: Diagnosis not present

## 2022-10-22 DIAGNOSIS — E669 Obesity, unspecified: Secondary | ICD-10-CM | POA: Diagnosis not present

## 2022-10-22 NOTE — Assessment & Plan Note (Signed)
Most recent A1c is  Lab Results  Component Value Date   HGBA1C 5.8 (H) 08/18/2022   HGBA1C 5.6 02/22/2013  . Patient informed of disease state and risk of progression. This may contribute to abnormal cravings, fatigue and diabetes complications without having diabetes.   We reviewed treatment options which include losing 7 to 10% of body weight, increasing physical activity to a 150 minutes a week of moderate intensity.She may also be a candidate for pharmacoprophylaxis with metformin or incretin mimetic.  Continue with nutritional and behavioral approaches to weight loss for now.

## 2022-10-22 NOTE — Assessment & Plan Note (Signed)
Very minimal involving her left index finger not apparent on her toenails.  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  I do not see obvious signs of tinea unguium and could be early.  At present no signs of psoriasis or lichen planus.  I recommend checking her iron levels.  She will also grow her nails out and store clippings in a jar which could be sent for culture.

## 2022-10-22 NOTE — Progress Notes (Signed)
Office: [phone]  /  Fax: [phone]  WEIGHT SUMMARY AND BIOMETRICS  Vitals Temp: 98 F (36.7 C) BP: 129/79 Pulse Rate: 72 SpO2: 97 %   Anthropometric Measurements Height: 5' (1.524 m) Weight: 154 lb (69.9 kg) BMI (Calculated): 30.08 Weight at Last Visit: 156lb Weight Lost Since Last Visit: 2 lb Starting Weight: 159 lb Total Weight Loss (lbs): 5 lb (2.268 kg) Peak Weight: 172 lb   Body Composition  Body Fat %: 40.1 % Fat Mass (lbs): 61.8 lbs Muscle Mass (lbs): 87.6 lbs Total Body Water (lbs): 59.6 lbs Visceral Fat Rating : 11    No data recorded Today's Visit #: 3  Starting Date: 09/16/22   HPI  Chief Complaint: OBESITY  Carol [name] is here to discuss her progress with her obesity treatment plan. She is on the the Category 1 Plan and states she is following her eating plan approximately 99 % of the time. She states she is exercising 30 minutes 4 times per week.  Interval History:  Since last office visit she has lost 2 lbs. She reports good adherence to reduced calorie nutritional plan. She has been working on reading food labels, not skipping meals, increasing protein intake at every meal, eating more fruits, eating more vegetables, drinking more water, avoiding and or reducing liquid calories, making healthier choices, begun to exercise, reducing portion sizes, and mindfulness around eating Denies problems with appetite and hunger signals.  Denies problems with satiety and satiation.  Denies problems with eating patterns and portion control.  Denies abnormal cravings. Denies feeling deprived or restricted.  She reports having brittle nails right index finger and a few toes.    Barriers identified: none.   Pharmacotherapy for weight loss: She is currently taking no anti-obesity medication.    ASSESSMENT AND PLAN  TREATMENT PLAN FOR OBESITY:  Recommended Dietary Goals  Carol [name] is currently in the action stage of change. As such, her goal is to  continue weight management plan. She has agreed to: continue current plan  Behavioral Intervention  We discussed the following Behavioral Modification Strategies today: increasing lean protein intake, decreasing simple carbohydrates , increasing vegetables, increasing lower glycemic fruits, increasing water intake, continue to practice mindfulness when eating, and planning for success.  Additional resources provided today: None  Recommended Physical Activity Goals  Carol [name] has been advised to work up to 150 minutes of moderate intensity aerobic activity a week and strengthening exercises 2-3 times per week for cardiovascular health, weight loss maintenance and preservation of muscle mass.   She has agreed to :  Start strengthening exercises with a goal of 2-3 sessions a week   Pharmacotherapy We discussed various medication options to help Carol [name] with her weight loss efforts and we both agreed to : continue with nutritional and behavioral strategies  ASSOCIATED CONDITIONS ADDRESSED TODAY  Brittle nails Assessment & Plan: Very minimal involving her left index finger not apparent on her toenails.  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  I do not see obvious signs of tinea unguium and could be early.  At present no signs of psoriasis or lichen planus.  I recommend checking her iron levels.  She will also grow her nails out and store clippings in a jar which could be sent for culture.  Orders: -     Iron, TIBC and Ferritin Panel  Prediabetes Assessment & Plan: Most recent A1c is  Lab Results  Component Value Date   HGBA1C 5.8 (H) 08/18/2022  HGBA1C 5.6 02/22/2013  . Patient informed of disease state and risk of progression. This may contribute to abnormal cravings, fatigue and diabetes complications without having diabetes.   We reviewed treatment options which include losing 7 to 10% of body weight, increasing physical activity to a 150 minutes a  week of moderate intensity.She may also be a candidate for pharmacoprophylaxis with metformin or incretin mimetic.  Continue with nutritional and behavioral approaches to weight loss for now.    Class 1 obesity with serious comorbidity and body mass index (BMI) of 31.0 to 31.9 in adult, unspecified obesity type  Cardiovascular risk factor Assessment & Plan: The 10-year ASCVD risk score (Arnett DK, et al., 2019) is: 8.6%  Patient counseled on cardiovascular risk.  She is currently on atorvastatin 20 mg once a day.  She may benefit from further lipid lowering but will continue to work with nutrition and behavioral strategies for now.      PHYSICAL EXAM:  Blood pressure 129/79, pulse 72, temperature 98 F (36.7 C), height 5' (1.524 m), weight 154 lb (69.9 kg), last menstrual period 06/23/1990, SpO2 97 %. Body mass index is 30.08 kg/m.  General: She is overweight, cooperative, alert, well developed, and in no acute distress. PSYCH: Has normal mood, affect and thought process.   HEENT: EOMI, sclerae are anicteric. Lungs: Normal breathing effort, no conversational dyspnea. Extremities: No edema.  Neurologic: No gross sensory or motor deficits. No tremors or fasciculations noted.    DIAGNOSTIC DATA REVIEWED:  BMET    Component Value Date/Time   NA 142 09/16/2022 0933   K 4.5 09/16/2022 0933   CL 105 09/16/2022 0933   CO2 22 09/16/2022 0933   GLUCOSE 107 (H) 09/16/2022 0933   GLUCOSE 143 (H) 05/07/2021 0442   BUN 22 09/16/2022 0933   CREATININE 0.95 09/16/2022 0933   CREATININE 0.76 03/16/2017 1354   CALCIUM 9.5 09/16/2022 0933   GFRNONAA >60 05/07/2021 0442   GFRAA 79 10/21/2019 1326   Lab Results  Component Value Date   HGBA1C 5.8 (H) 08/18/2022   HGBA1C 5.6 02/22/2013   Lab Results  Component Value Date   INSULIN 11.1 09/16/2022   Lab Results  Component Value Date   TSH 2.380 08/18/2022   CBC    Component Value Date/Time   WBC 7.4 04/23/2022 1331   WBC 14.2  (H) 05/07/2021 0446   RBC 4.76 04/23/2022 1331   RBC 3.78 (L) 05/07/2021 0446   HGB 14.2 04/23/2022 1331   HGB 14.3 05/21/2016 1128   HCT 42.1 04/23/2022 1331   PLT 307 04/23/2022 1331   MCV 88 04/23/2022 1331   MCH 29.8 04/23/2022 1331   MCH 29.1 05/07/2021 0446   MCHC 33.7 04/23/2022 1331   MCHC 33.3 05/07/2021 0446   RDW 12.8 04/23/2022 1331   Iron Studies No results found for: "IRON", "TIBC", "FERRITIN", "IRONPCTSAT" Lipid Panel     Component Value Date/Time   CHOL 203 (H) 09/16/2022 0933   TRIG 181 (H) 09/16/2022 0933   HDL 59 09/16/2022 0933   CHOLHDL 3.6 04/23/2022 1331   CHOLHDL 3.2 05/21/2016 1407   VLDL 44 (H) 05/21/2016 1407   LDLCALC 113 (H) 09/16/2022 0933   Hepatic Function Panel     Component Value Date/Time   PROT 7.4 09/16/2022 0933   ALBUMIN 4.8 09/16/2022 0933   AST 24 09/16/2022 0933   ALT 25 09/16/2022 0933   ALKPHOS 103 09/16/2022 0933   BILITOT 0.2 09/16/2022 0933      Component  Value Date/Time   TSH 2.380 08/18/2022 1310   Nutritional Lab Results  Component Value Date   VD25OH 51.2 04/23/2022   VD25OH 39.4 05/27/2017   VD25OH 31 05/21/2016     Return in about 2 weeks (around 11/05/2022) for For Weight Mangement with Dr. Rikki Spearing.Marland Kitchen She was informed of the importance of frequent follow up visits to maximize her success with intensive lifestyle modifications for her multiple health conditions.   ATTESTASTION STATEMENTS:  Reviewed by clinician on day of visit: allergies, medications, problem list, medical history, surgical history, family history, social history, and previous encounter notes.     Worthy Rancher, MD

## 2022-10-22 NOTE — Assessment & Plan Note (Signed)
The 10-year ASCVD risk score (Arnett DK, et al., 2019) is: 8.6%  Patient counseled on cardiovascular risk.  She is currently on atorvastatin 20 mg once a day.  She may benefit from further lipid lowering but will continue to work with nutrition and behavioral strategies for now.

## 2022-10-23 ENCOUNTER — Ambulatory Visit: Payer: PPO | Attending: Obstetrics & Gynecology

## 2022-10-23 DIAGNOSIS — R279 Unspecified lack of coordination: Secondary | ICD-10-CM | POA: Diagnosis not present

## 2022-10-23 DIAGNOSIS — M6281 Muscle weakness (generalized): Secondary | ICD-10-CM | POA: Diagnosis not present

## 2022-10-23 DIAGNOSIS — M62838 Other muscle spasm: Secondary | ICD-10-CM | POA: Diagnosis not present

## 2022-10-23 LAB — IRON,TIBC AND FERRITIN PANEL
Ferritin: 166 ng/mL — ABNORMAL HIGH (ref 15–150)
Iron Saturation: 15 % (ref 15–55)
Iron: 47 ug/dL (ref 27–139)
Total Iron Binding Capacity: 323 ug/dL (ref 250–450)
UIBC: 276 ug/dL (ref 118–369)

## 2022-10-23 NOTE — Therapy (Signed)
OUTPATIENT PHYSICAL THERAPY FEMALE PELVIC TREATMENT   Patient Name: Carol [name] MRN: [id number] DOB:04/23/1951, 72 y.o., female Today's Date: 10/23/2022  END OF SESSION:  PT End of Session - 10/23/22 0758     Visit Number 3    Date for PT Re-Evaluation 12/24/22    Authorization Type [employer]    Progress Note Due on Visit 10    PT Start Time 0800    PT Stop Time 0840    PT Time Calculation (min) 40 min    Activity Tolerance Patient tolerated treatment well    Behavior During Therapy [facility] for tasks assessed/performed              Past Medical History:  Diagnosis Date   Abnormal Pap smear of cervix    in 20's   Anxiety    claustrophobic   Arthritis    "neck, hands" (09/02/2013)   Asthma    Basal cell carcinoma of forehead 2008; 2014   Dizziness last occurance 10-15-2014   on occasion, not related to activity   DVT (deep venous thrombosis) (HCC) 06/28/2014   Dr Susann Givens, right leg, behind knee   Environmental allergies    Exercise-induced asthma 30's   Gallbladder problem    GERD (gastroesophageal reflux disease)    H/O hiatal hernia    Herpes simplex 1976   History of colonic polyps 05/21/2016   History of kidney stones    Hx of blood clots    Hyperlipidemia    IBS (irritable bowel syndrome)    Joint pain    Kidney problem    Migraine teens, returned in 40"s   "depends on the change in the weather; last one was 1 wk ago" (09/02/2013)   Obesity    Osteoarthritis    Plantar fasciitis    PONV (postoperative nausea and vomiting) 08/2013   ponv after gallblaffer   Prediabetes    Right ureteral stone    Seasonal allergic rhinitis    SOB (shortness of breath)    Tubular adenoma of colon 05/22/2017   per path report.    Tubular adenoma of colon 06/29/2020   Past Surgical History:  Procedure Laterality Date   ABDOMINAL HYSTERECTOMY  1992   CESAREAN SECTION  1981   CHOLECYSTECTOMY N/A 09/02/2013   Procedure: LAPAROSCOPIC CHOLECYSTECTOMY;   Surgeon: Shelly Rubenstein, MD;  Location: [facility];  Service: General;  Laterality: N/A;   CRYOTHERAPY     for abnormal pap smear in 20's   CYSTOSCOPY W/ URETERAL STENT PLACEMENT  03/05/2012   Procedure: CYSTOSCOPY WITH RETROGRADE PYELOGRAM/URETERAL STENT PLACEMENT;  Surgeon: Lindaann Slough, MD;  Location: [facility];  Service: Urology;  Laterality: Right;   CYSTOSCOPY W/ URETERAL STENT PLACEMENT Right 01/19/2015   Procedure: CYSTOSCOPY WITH RETROGRADE PYELOGRAM/URETERAL  RIGHT STENT PLACEMENT;  Surgeon: Sebastian Ache, MD;  Location: [facility];  Service: Urology;  Laterality: Right;   CYSTOSCOPY WITH RETROGRADE PYELOGRAM, URETEROSCOPY AND STENT PLACEMENT Right 09/08/2014   Procedure: CYSTOSCOPY WITH RIGHT  RETROGRADE PYELOGRAM, URETEROSCOPY AND RIGHT STENT PLACEMENT;  Surgeon: Heloise Purpura, MD;  Location: [facility];  Service: Urology;  Laterality: Right;   CYSTOSCOPY WITH RETROGRADE PYELOGRAM, URETEROSCOPY AND STENT PLACEMENT Right 10/19/2014   Procedure: CYSTOSCOPY WITH Bilateral RETROGRADE PYELOGRAM/RIGHT URETEROSCOPY,RIGHT JJ STENT;  Surgeon: Jethro Bolus, MD;  Location: [facility];  Service: Urology;  Laterality: Right;   CYSTOSCOPY/RETROGRADE/URETEROSCOPY  11/18/2011   Procedure: CYSTOSCOPY/RETROGRADE/URETEROSCOPY;  Surgeon: Kathi Ludwig, MD;  Location: [facility];  Service: Urology;  Laterality: Right;  CYSTOSCOPY/RETROGRADE/URETEROSCOPY/STONE EXTRACTION WITH BASKET  10/21/2011   Procedure: CYSTOSCOPY/RETROGRADE/URETEROSCOPY/STONE EXTRACTION WITH BASKET;  Surgeon: Kathi Ludwig, MD;  Location[facility];  Service: Urology;  Laterality: Right;   CYSTOSCOPY/RETROGRADE/URETEROSCOPY/STONE EXTRACTION WITH BASKET Right 09/10/2012   Procedure: RIGHT URETEROSCOPY/STONE EXTRACTION/LASER LITHOTRIPSY/POSSIBLE DOUBLE J STENT PLACEMENT;  Surgeon: Kathi Ludwig, MD;  Location: [facility];  Service: Urology;  Laterality: Right;   CYSTOSCOPY/URETEROSCOPY/HOLMIUM  LASER/STENT PLACEMENT Left 05/02/2021   Procedure: CYSTOSCOPY/URETEROSCOPY/HOLMIUM LASER/STENT PLACEMENT;  Surgeon: Jerilee Field, MD;  Location: [facility];  Service: Urology;  Laterality: Left;   MOHS SURGERY  2008; 2014   PLANTAR FASCIA SURGERY  05/2016   POSTERIOR PINNACLE MESH SACROSPINOUS REPAIR  11-22-2010   RECURRENT ENTEROCELE   RIGHT URETEROSCOPIC STONE EXTRACTION W/ STENT PLACEMENT  08-12-2011   x2 stents   ROBOTICALLY ASSISTED LAPAROSCOPIC URETERAL RE-IMPLANTATION Right 01/19/2015   Procedure: ROBOTICALLY ASSISTED LAPAROSCOPIC URETERAL RE-IMPLANTATION RIGHT;  Surgeon: Sebastian Ache, MD;  Location: [facility];  Service: Urology;  Laterality: Right;   TUBAL LIGATION  1991   VAULT SUSPENSION (DERMAL GRAFT)/ ENTEROCELE AND RECTOCELE REPAIR  12-05-2005   VAULT PROLAPSE/ ENTERCELE/ RECTOCELE   Patient Active Problem List   Diagnosis Date Noted   Brittle nails 10/22/2022   Cardiovascular risk factor 09/30/2022   Encounter for preventive care 09/30/2022   Prediabetes 09/16/2022   Depression screening 09/16/2022   SOB (shortness of breath) on exertion 09/16/2022   Other fatigue 09/16/2022   Recurrent kidney stones 09/01/2022   Tremor 05/07/2021   Class 1 obesity with serious comorbidity and body mass index (BMI) of 31.0 to 31.9 in adult 05/07/2021   Thrombocytopenia (HCC) 05/04/2021   History of renal stone 05/24/2020   History of DVT of lower extremity 10/23/2017   Nocturnal muscle cramps 10/23/2017   History of colonic polyps 05/21/2016   S/P laparoscopic cholecystectomy March 2015 09/03/2013   Hyperlipidemia with target LDL less than 100 06/27/2011   Herpes 12/16/2010   GERD (gastroesophageal reflux disease) 12/16/2010   HH (hiatus hernia) 12/16/2010   Hot flashes, menopausal 12/16/2010   Diverticulosis 12/16/2010   Asthma in adult, mild intermittent, uncomplicated 12/16/2010   BCE (basal cell epithelioma), 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 She reports that pain is now intermittent vs constant.  Fluid intake: Yes: water    PAIN:  Are you having pain? Yes NPRS scale: 1/10 Pain location:  inguinal left  Pain type: aching and dull Pain description: intermittent   Aggravating factors: coughing, turning and lifting, touching it Relieving factors: ibuprofen  PRECAUTIONS: None  WEIGHT BEARING RESTRICTIONS: No  FALLS:  Has patient fallen in last 6 months? No  LIVING ENVIRONMENT: Lives with: lives with their spouse Lives in: House/apartment   OCCUPATION: retired  PLOF: Independent  PATIENT GOALS: pain and frequency, urge, leakage  PERTINENT HISTORY:  Hysterectomy, bladder tacked 2x Sexual abuse: No  BOWEL MOVEMENT: Pain with bowel movement: No Type of bowel movement:Type (Bristol Stool Scale) soft, Frequency daily, and Strain No Fully empty rectum: Yes:   Leakage: No Pads: No Fiber supplement:   URINATION: Pain with urination: No Fully empty bladder: No Stream: Weak Urgency: Yes:   Frequency: going every 2/hour, nocturia 2-3x Leakage: Urge to void, Walking to the bathroom, Coughing, Sneezing, and Exercise Pads: Yes: 1 am and 1 pm  INTERCOURSE: Pain with intercourse:  No   PREGNANCY: Vaginal deliveries 2 Tearing No C-section deliveries 1   PROLAPSE: None   OBJECTIVE:   DIAGNOSTIC FINDINGS:    PATIENT SURVEYS:    PFIQ-7    COGNITION: Overall cognitive status: Within functional limits for tasks assessed     SENSATION: Light touch: Appears intact Proprioception:   MUSCLE LENGTH: Hamstrings: Right 80 deg; Left 80 deg Thomas test:   LUMBAR SPECIAL TESTS:  Straight leg raise test: Negative  FUNCTIONAL TESTS:    GAIT:  Comments: WFL   POSTURE: rounded shoulders, anterior pelvic tilt, and flexed trunk   PELVIC ALIGNMENT: normal  LUMBARAROM/PROM: WFL   A/PROM A/PROM  eval  Flexion 75%  Extension   Right lateral flexion 75%  Left lateral flexion 75%  Right rotation   Left rotation    (Blank rows = not tested)  LOWER EXTREMITY ROM: WFL   Passive ROM Right eval Left eval  Hip flexion    Hip extension    Hip abduction    Hip adduction    Hip internal rotation    Hip external rotation    Knee flexion    Knee extension    Ankle dorsiflexion    Ankle plantarflexion    Ankle inversion    Ankle eversion     (Blank rows = not tested)  LOWER EXTREMITY MMT:  MMT Right eval Left eval  Hip flexion  4/5  Hip extension    Hip abduction 4/5 4/5  Hip adduction 4/5 4/5  Hip internal rotation    Hip external rotation 4/5   Knee flexion    Knee extension    Ankle dorsiflexion    Ankle plantarflexion    Ankle inversion    Ankle eversion     PALPATION:   General  left lumbar paraspinals more tight than right, severe TTP ASIS and inguinal region                External Perineal Exam normal                             Internal Pelvic Floor tight levators with mild sensitivity slightly more on Left, contracted with inhale on deep inspiration  Patient confirms identification and approves PT to assess  internal pelvic floor and treatment Yes  PELVIC MMT:   MMT eval  Vaginal 2/5 x 5 with extra time, 1 sec hold  Internal Anal Sphincter   External Anal Sphincter   Puborectalis   Diastasis Recti   (Blank rows = not tested)        TONE: High Lt>Rt  PROLAPSE: no  TODAY'S  TREATMENT:                                                                                                                              DATE:  10/23/22 Manual: Lt abdominal soft tissue mobilization/myofascial release above/below iliac crest Exercises: Wide foot/knee lower trunk rotation with 10 second holds 2 x 10 Modified thomas stretch 2 x 60 seconds Bridge 10x Supine march 2 x 10 Seated lateral flexion over peanut ball 2 x 60 seconds  10/13/22  NMRE: Breathing and bulging with stretches and transversus abdominus activaiton with exercises:  All stretches and exercises mentioned below as stated: - Supine Pelvic Floor Stretch  - 1 x daily - 7 x weekly - 1 sets - 3 reps - 30 sec hold - Supine Hip Internal and External Rotation  - 1 x daily - 7 x weekly - 1 sets - 10 reps - 5 sec hold - Supine Piriformis Stretch  - 1 x daily - 7 x weekly - 1 sets - 3 reps - 30 sec hold - Supine Transversus Abdominis Bracing with Double Leg Fallout  - 1 x daily - 7 x weekly - 2 sets - 10 reps - Dead Bug  - 1 x daily - 7 x weekly - 3 sets - 10 reps - Quadruped Circle Weight Shifts  - 1 x daily - 7 x weekly - 3 sets - 10 reps - Quadruped Thoracic Spine Extension  - 1 x daily - 7 x weekly - 3 sets - 10 reps  PATIENT EDUCATION:  Education details: Access Code: [email or web address] Person educated: Patient Education method: Explanation, Demonstration, Tactile cues, Verbal cues, and Handouts Education comprehension: verbalized understanding and returned demonstration  HOME EXERCISE PROGRAM: Access Code: [email or web address] URL: [email or web address] Date: 10/13/2022 Prepared by: Dwana Curd  Exercises - Supine Pelvic Floor Stretch  - 1 x daily - 7 x weekly - 1 sets - 3 reps - 30 sec hold - Supine Hip Internal and External Rotation  - 1 x daily - 7 x weekly - 1 sets - 10 reps - 5 sec hold - Supine Piriformis Stretch  - 1 x daily - 7 x weekly - 1 sets - 3 reps - 30 sec hold - Supine Transversus Abdominis  Bracing with Double Leg Fallout  - 1 x daily - 7 x weekly - 2 sets - 10 reps - Dead Bug  - 1 x daily - 7 x weekly - 3 sets - 10 reps - Quadruped Circle Weight Shifts  - 1 x daily - 7 x weekly - 3 sets - 10 reps -  Quadruped Thoracic Spine Extension  - 1 x daily - 7 x weekly - 3 sets - 10 reps  ASSESSMENT:  CLINICAL IMPRESSION: Pt tolerated manual techniques to Lt lower quadrant well, but did have notable tenderness to touch along iliac crest. Tenderness did decrease throughout soft tissue mobilization/myofascial release. She did very well with gentle strengthening and mobility progressions. HEP not updated this session, but if she reports good tolerance to today, we will plan to progress next session. Pt reported slight aggravation of pain at end of session to a constant 2/10 instead of an intermittent 1/10. When working on breathing in lateral flexion over peanut, she had difficulty with posterior expansion. Pt will benefit from skilled PT to address impairments and improve function and pain.  OBJECTIVE IMPAIRMENTS: decreased coordination, decreased endurance, decreased ROM, decreased strength, increased fascial restrictions, increased muscle spasms, impaired flexibility, impaired tone, postural dysfunction, and pain.   ACTIVITY LIMITATIONS: lifting, sitting, continence, and toileting  PARTICIPATION LIMITATIONS: community activity  PERSONAL FACTORS: Time since onset of injury/illness/exacerbation and 3+ comorbidities: fell <1 year ago with broken ankle Rt; vaginal deliveries, c-section, bladder surgery, hysterectomy  are also affecting patient's functional outcome.   REHAB POTENTIAL: Excellent  CLINICAL DECISION MAKING: Evolving/moderate complexity  EVALUATION COMPLEXITY: Moderate   GOALS: Goals reviewed with patient? Yes  SHORT TERM GOALS: Target date: 10/29/22  Ind with correct muscle movement of core with breathing Baseline: Goal status: IN PROGRESS  2.  Urge techniques  known Baseline:  Goal status: MET  3.  Ind with initial core strengthening Baseline:  Goal status: IN PROGRESS  4.  Pain reduced by 25% Baseline:  Goal status: IN PROGRESS   LONG TERM GOALS: Target date: 12/24/22  Pt will be independent with advanced HEP to maintain improvements made throughout therapy  Baseline:  Goal status: INITIAL  2.  Pt will report 75% reduction of pain due to improvements in posture, strength, and muscle length  Baseline:  Goal status: INITIAL  3.  Pt will have to use 0-1 pads per day  Baseline:  Goal status: INITIAL  4.  Pt will be able to functional actions such as walking to the bathroom without leakage  Baseline:  Goal status: INITIAL  5.  Pt will have 50% less urgency due to bladder retraining and strengthening  Baseline:  Goal status: INITIAL  6.  Pt will have nocturia decreased to 1/night at most  Baseline: 4 x last night  Goal status: INITIAL  PLAN:  PT FREQUENCY: 1x/week  PT DURATION: 12 weeks  PLANNED INTERVENTIONS: Therapeutic exercises, Therapeutic activity, Neuromuscular re-education, Balance training, Gait training, Patient/Family education, Self Care, Joint mobilization, Dry Needling, Electrical stimulation, Cryotherapy, Moist heat, Taping, Biofeedback, Manual therapy, and Re-evaluation  PLAN FOR NEXT SESSION: Work on posterior mobility; continue manual techniques to Lt lower quadrant if tolerated well; updated HEP depending on tolerance; address goals.    Julio Alm, PT, DPT05/02/248:47 AM

## 2022-10-27 ENCOUNTER — Encounter (INDEPENDENT_AMBULATORY_CARE_PROVIDER_SITE_OTHER): Payer: PPO | Admitting: Internal Medicine

## 2022-10-28 DIAGNOSIS — H66002 Acute suppurative otitis media without spontaneous rupture of ear drum, left ear: Secondary | ICD-10-CM | POA: Diagnosis not present

## 2022-10-28 DIAGNOSIS — Z6829 Body mass index (BMI) 29.0-29.9, adult: Secondary | ICD-10-CM | POA: Diagnosis not present

## 2022-11-13 ENCOUNTER — Ambulatory Visit (INDEPENDENT_AMBULATORY_CARE_PROVIDER_SITE_OTHER): Payer: PPO | Admitting: Internal Medicine

## 2022-11-13 ENCOUNTER — Encounter (INDEPENDENT_AMBULATORY_CARE_PROVIDER_SITE_OTHER): Payer: Self-pay | Admitting: Internal Medicine

## 2022-11-13 VITALS — BP 124/74 | HR 72 | Temp 98.5°F | Ht 60.0 in | Wt 152.0 lb

## 2022-11-13 DIAGNOSIS — Z713 Dietary counseling and surveillance: Secondary | ICD-10-CM

## 2022-11-13 DIAGNOSIS — Z6829 Body mass index (BMI) 29.0-29.9, adult: Secondary | ICD-10-CM

## 2022-11-13 DIAGNOSIS — E669 Obesity, unspecified: Secondary | ICD-10-CM | POA: Diagnosis not present

## 2022-11-13 DIAGNOSIS — R7303 Prediabetes: Secondary | ICD-10-CM

## 2022-11-13 DIAGNOSIS — E785 Hyperlipidemia, unspecified: Secondary | ICD-10-CM

## 2022-11-13 NOTE — Progress Notes (Addendum)
Office: [phone]  /  Fax: [phone]  WEIGHT SUMMARY AND BIOMETRICS  No data recorded  Anthropometric Measurements Height: 5' (1.524 m) Weight at Last Visit: 152lb Starting Weight: 159lb Peak Weight: 172lb   No data recorded   No data recorded Today's Visit #: 5  Starting Date: 09/16/22   HPI  Chief Complaint: OBESITY  Carol [name] is here to discuss her progress with her obesity treatment plan. She is on the keeping a food journal and adhering to recommended goals of 1000 calories and 90-100 protein and states she is following her eating plan approximately 50 % of the time. She states she is exercising walking, weights, chair exercises, 45 minutes 3-4 times per week.  Interval History:  Since last office visit she has [x]  lost []  maintained [] gained weight.  2 pounds and this was after an 11-day vacation trip.  Adherence to nutrition plan :  []  Excellent [x]  Good []  Fair []  Suboptimal []  Variable []  Gradual implementation [] Has not started implementation  Nutritional: She has been working on reading food labels, not skipping meals, increasing protein intake at every meal, eating more fruits, eating more vegetables, drinking more water, journaling and tracking calories, making healthier choices, and continues to exercise  Orexigenic Control: [x] Denies [] Reports problems with appetite and hunger signals.  [x] Denies [] Reports problems with satiety and satiation.  [x] Denies [] Reports problems with eating patterns and portion control.  [x] Denies [] Reports strong cravings for highly palatable foods [x] Denies [] Reports problems with feeling restricted or deprived  Stress levels: [x]  Low [] Medium [] High   Barriers identified: none.   Pharmacotherapy for weight loss: She is currently taking no anti-obesity medication.    ASSESSMENT AND PLAN  TREATMENT PLAN FOR OBESITY:  Recommended Dietary Goals  Carol [name] is currently in the action stage of change. As such, her goal  is to continue weight management plan. She has agreed to: continue current plan  Behavioral Intervention  We discussed the following Behavioral Modification Strategies today: increasing lean protein intake, decreasing simple carbohydrates , increasing vegetables, increasing lower glycemic fruits, increasing fiber rich foods, increasing water intake, continue to practice mindfulness when eating, and planning for success.  Additional resources provided today: Information on Full Plate Living (plant-based nutrition) - fullplateliving.org  Recommended Physical Activity Goals  Carol [name] has been advised to work up to 150 minutes of moderate intensity aerobic activity a week and strengthening exercises 2-3 times per week for cardiovascular health, weight loss maintenance and preservation of muscle mass.   She has agreed to :  Start strengthening exercises with a goal of 2-3 sessions a week provided with dumbbell exercise handout  Pharmacotherapy We have discussed various medication options to help Carol [name] with her weight loss efforts and we both agreed to : continue with nutritional and behavioral strategies  ASSOCIATED CONDITIONS ADDRESSED TODAY  Prediabetes Assessment & Plan: Most recent A1c is  Lab Results  Component Value Date   HGBA1C 5.8 (H) 08/18/2022   HGBA1C 5.6 02/22/2013  . Patient informed of disease state and risk of progression. This may contribute to abnormal cravings, fatigue and diabetes complications without having diabetes.   We reviewed treatment options which include losing 7 to 10% of body weight, increasing physical activity to a 150 minutes a week of moderate intensity.She may also be a candidate for pharmacoprophylaxis with metformin or incretin mimetic.  Continue with nutritional and behavioral approaches to weight loss for now.    Class 1 obesity with serious comorbidity and body mass index (BMI) of 31.0 to 31.9  in adult, unspecified obesity type  Hyperlipidemia  with target LDL less than 100 Assessment & Plan: LDL is not at goal. Elevated LDL may be secondary to nutrition, genetics and spillover effect from excess adiposity. Recommended LDL goal is <70 to reduce the risk of fatty streaks and the progression to obstructive ASCVD in the future. Her 10 year risk is: The 10-year ASCVD risk score (Arnett DK, et al., 2019) is: 7.9%.  She is currently on atorvastatin 20 mg once a day  Lab Results  Component Value Date   CHOL 203 (H) 09/16/2022   HDL 59 09/16/2022   LDLCALC 113 (H) 09/16/2022   TRIG 181 (H) 09/16/2022   CHOLHDL 3.6 04/23/2022    Continue weight loss therapy, losing 10% or more of body weight may improve condition. Also advised to reduce saturated fats in diet to less than 10% of daily calories.  Continue statin therapy at current dose.        PHYSICAL EXAM:  Blood pressure 124/74, pulse 72, temperature 98.5 F (36.9 C), height 5' (1.524 m), weight 152 lb (68.9 kg), last menstrual period 06/23/1990, SpO2 96 %. Body mass index is 29.69 kg/m.  General: She is overweight, cooperative, alert, well developed, and in no acute distress. PSYCH: Has normal mood, affect and thought process.   HEENT: EOMI, sclerae are anicteric. Lungs: Normal breathing effort, no conversational dyspnea. Extremities: No edema.  Neurologic: No gross sensory or motor deficits. No tremors or fasciculations noted.    DIAGNOSTIC DATA REVIEWED:  BMET    Component Value Date/Time   NA 142 09/16/2022 0933   K 4.5 09/16/2022 0933   CL 105 09/16/2022 0933   CO2 22 09/16/2022 0933   GLUCOSE 107 (H) 09/16/2022 0933   GLUCOSE 143 (H) 05/07/2021 0442   BUN 22 09/16/2022 0933   CREATININE 0.95 09/16/2022 0933   CREATININE 0.76 03/16/2017 1354   CALCIUM 9.5 09/16/2022 0933   GFRNONAA >60 05/07/2021 0442   GFRAA 79 10/21/2019 1326   EGFR 65 09/16/2022 0933   Lab Results  Component Value Date   HGBA1C 5.8 (H) 08/18/2022   HGBA1C 5.6 02/22/2013   Lab  Results  Component Value Date   INSULIN 11.1 09/16/2022   Lab Results  Component Value Date   TSH 2.380 08/18/2022   CBC    Component Value Date/Time   WBC 7.4 04/23/2022 1331   WBC 14.2 (H) 05/07/2021 0446   RBC 4.76 04/23/2022 1331   RBC 3.78 (L) 05/07/2021 0446   HGB 14.2 04/23/2022 1331   HGB 14.3 05/21/2016 1128   HCT 42.1 04/23/2022 1331   PLT 307 04/23/2022 1331   MCV 88 04/23/2022 1331   MCH 29.8 04/23/2022 1331   MCH 29.1 05/07/2021 0446   MCHC 33.7 04/23/2022 1331   MCHC 33.3 05/07/2021 0446   RDW 12.8 04/23/2022 1331   Iron Studies    Component Value Date/Time   IRON 47 10/22/2022 1430   TIBC 323 10/22/2022 1430   FERRITIN 166 (H) 10/22/2022 1430   IRONPCTSAT 15 10/22/2022 1430   Lipid Panel     Component Value Date/Time   CHOL 203 (H) 09/16/2022 0933   TRIG 181 (H) 09/16/2022 0933   HDL 59 09/16/2022 0933   CHOLHDL 3.6 04/23/2022 1331   CHOLHDL 3.2 05/21/2016 1407   VLDL 44 (H) 05/21/2016 1407   LDLCALC 113 (H) 09/16/2022 0933   Hepatic Function Panel     Component Value Date/Time   PROT 7.4 09/16/2022 0933  ALBUMIN 4.8 09/16/2022 0933   AST 24 09/16/2022 0933   ALT 25 09/16/2022 0933   ALKPHOS 103 09/16/2022 0933   BILITOT 0.2 09/16/2022 0933      Component Value Date/Time   TSH 2.380 08/18/2022 1310   Nutritional Lab Results  Component Value Date   VD25OH 51.2 04/23/2022   VD25OH 39.4 05/27/2017   VD25OH 31 05/21/2016     Return in about 3 weeks (around 12/04/2022) for For Weight Mangement with Dr. Rikki Spearing.Marland Kitchen She was informed of the importance of frequent follow up visits to maximize her success with intensive lifestyle modifications for her multiple health conditions.   ATTESTASTION STATEMENTS:  Reviewed by clinician on day of visit: allergies, medications, problem list, medical history, surgical history, family history, social history, and previous encounter notes.     Worthy Rancher, MD

## 2022-11-13 NOTE — Assessment & Plan Note (Signed)
Most recent A1c is  Lab Results  Component Value Date   HGBA1C 5.8 (H) 08/18/2022   HGBA1C 5.6 02/22/2013  . Patient informed of disease state and risk of progression. This may contribute to abnormal cravings, fatigue and diabetes complications without having diabetes.   We reviewed treatment options which include losing 7 to 10% of body weight, increasing physical activity to a 150 minutes a week of moderate intensity.She may also be a candidate for pharmacoprophylaxis with metformin or incretin mimetic.  Continue with nutritional and behavioral approaches to weight loss for now.  

## 2022-11-13 NOTE — Assessment & Plan Note (Signed)
LDL is not at goal. Elevated LDL may be secondary to nutrition, genetics and spillover effect from excess adiposity. Recommended LDL goal is <70 to reduce the risk of fatty streaks and the progression to obstructive ASCVD in the future. Her 10 year risk is: The 10-year ASCVD risk score (Arnett DK, et al., 2019) is: 7.9%.  She is currently on atorvastatin 20 mg once a day  Lab Results  Component Value Date   CHOL 203 (H) 09/16/2022   HDL 59 09/16/2022   LDLCALC 113 (H) 09/16/2022   TRIG 181 (H) 09/16/2022   CHOLHDL 3.6 04/23/2022    Continue weight loss therapy, losing 10% or more of body weight may improve condition. Also advised to reduce saturated fats in diet to less than 10% of daily calories.  Continue statin therapy at current dose.

## 2022-11-20 ENCOUNTER — Ambulatory Visit: Payer: PPO | Admitting: Physical Therapy

## 2022-11-20 DIAGNOSIS — R279 Unspecified lack of coordination: Secondary | ICD-10-CM

## 2022-11-20 DIAGNOSIS — M62838 Other muscle spasm: Secondary | ICD-10-CM

## 2022-11-20 DIAGNOSIS — M6281 Muscle weakness (generalized): Secondary | ICD-10-CM

## 2022-11-20 NOTE — Therapy (Signed)
OUTPATIENT PHYSICAL THERAPY FEMALE PELVIC TREATMENT   Patient Name: Carol [name] MRN: [id number] DOB:December 28, 1950, 72 y.o., female Today's Date: 11/21/2022  END OF SESSION:  PT End of Session - 11/21/22 1435     Visit Number 4    Date for PT Re-Evaluation 12/24/22    Authorization Type [employer]    Progress Note Due on Visit 10    PT Start Time 1148    PT Stop Time 1227    PT Time Calculation (min) 39 min    Activity Tolerance Patient tolerated treatment well    Behavior During Therapy WFL for tasks assessed/performed               Past Medical History:  Diagnosis Date   Abnormal Pap smear of cervix    in 20's   Anxiety    claustrophobic   Arthritis    "neck, hands" (09/02/2013)   Asthma    Basal cell carcinoma of forehead 2008; 2014   Dizziness last occurance 10-15-2014   on occasion, not related to activity   DVT (deep venous thrombosis) (HCC) 06/28/2014   Dr Susann Givens, right leg, behind knee   Environmental allergies    Exercise-induced asthma 30's   Gallbladder problem    GERD (gastroesophageal reflux disease)    H/O hiatal hernia    Herpes simplex 1976   History of colonic polyps 05/21/2016   History of kidney stones    Hx of blood clots    Hyperlipidemia    IBS (irritable bowel syndrome)    Joint pain    Kidney problem    Migraine teens, returned in 40"s   "depends on the change in the weather; last one was 1 wk ago" (09/02/2013)   Obesity    Osteoarthritis    Plantar fasciitis    PONV (postoperative nausea and vomiting) 08/2013   ponv after gallblaffer   Prediabetes    Right ureteral stone    Seasonal allergic rhinitis    SOB (shortness of breath)    Tubular adenoma of colon 05/22/2017   per path report.    Tubular adenoma of colon 06/29/2020   Past Surgical History:  Procedure Laterality Date   ABDOMINAL HYSTERECTOMY  1992   CESAREAN SECTION  1981   CHOLECYSTECTOMY N/A 09/02/2013   Procedure: LAPAROSCOPIC CHOLECYSTECTOMY;   Surgeon: Shelly Rubenstein, MD;  Location: [facility];  Service: General;  Laterality: N/A;   CRYOTHERAPY     for abnormal pap smear in 20's   CYSTOSCOPY W/ URETERAL STENT PLACEMENT  03/05/2012   Procedure: CYSTOSCOPY WITH RETROGRADE PYELOGRAM/URETERAL STENT PLACEMENT;  Surgeon: Lindaann Slough, MD;  Location: [facility];  Service: Urology;  Laterality: Right;   CYSTOSCOPY W/ URETERAL STENT PLACEMENT Right 01/19/2015   Procedure: CYSTOSCOPY WITH RETROGRADE PYELOGRAM/URETERAL  RIGHT STENT PLACEMENT;  Surgeon: Sebastian Ache, MD;  Location: [facility];  Service: Urology;  Laterality: Right;   CYSTOSCOPY WITH RETROGRADE PYELOGRAM, URETEROSCOPY AND STENT PLACEMENT Right 09/08/2014   Procedure: CYSTOSCOPY WITH RIGHT  RETROGRADE PYELOGRAM, URETEROSCOPY AND RIGHT STENT PLACEMENT;  Surgeon: Heloise Purpura, MD;  Location: [facility];  Service: Urology;  Laterality: Right;   CYSTOSCOPY WITH RETROGRADE PYELOGRAM, URETEROSCOPY AND STENT PLACEMENT Right 10/19/2014   Procedure: CYSTOSCOPY WITH Bilateral RETROGRADE PYELOGRAM/RIGHT URETEROSCOPY,RIGHT JJ STENT;  Surgeon: Jethro Bolus, MD;  Location: [facility];  Service: Urology;  Laterality: Right;   CYSTOSCOPY/RETROGRADE/URETEROSCOPY  11/18/2011   Procedure: CYSTOSCOPY/RETROGRADE/URETEROSCOPY;  Surgeon: Kathi Ludwig, MD;  Location: [facility];  Service: Urology;  Laterality: Right;  CYSTOSCOPY/RETROGRADE/URETEROSCOPY/STONE EXTRACTION WITH BASKET  10/21/2011   Procedure: CYSTOSCOPY/RETROGRADE/URETEROSCOPY/STONE EXTRACTION WITH BASKET;  Surgeon: Kathi Ludwig, MD;  Location: [facility];  Service: Urology;  Laterality: Right;   CYSTOSCOPY/RETROGRADE/URETEROSCOPY/STONE EXTRACTION WITH BASKET Right 09/10/2012   Procedure: RIGHT URETEROSCOPY/STONE EXTRACTION/LASER LITHOTRIPSY/POSSIBLE DOUBLE J STENT PLACEMENT;  Surgeon: Kathi Ludwig, MD;  Location: [facility];  Service: Urology;  Laterality: Right;   CYSTOSCOPY/URETEROSCOPY/HOLMIUM  LASER/STENT PLACEMENT Left 05/02/2021   Procedure: CYSTOSCOPY/URETEROSCOPY/HOLMIUM LASER/STENT PLACEMENT;  Surgeon: Jerilee Field, MD;  Location: [facility];  Service: Urology;  Laterality: Left;   MOHS SURGERY  2008; 2014   PLANTAR FASCIA SURGERY  05/2016   POSTERIOR PINNACLE MESH SACROSPINOUS REPAIR  11-22-2010   RECURRENT ENTEROCELE   RIGHT URETEROSCOPIC STONE EXTRACTION W/ STENT PLACEMENT  08-12-2011   x2 stents   ROBOTICALLY ASSISTED LAPAROSCOPIC URETERAL RE-IMPLANTATION Right 01/19/2015   Procedure: ROBOTICALLY ASSISTED LAPAROSCOPIC URETERAL RE-IMPLANTATION RIGHT;  Surgeon: Sebastian Ache, MD;  Location: [facility];  Service: Urology;  Laterality: Right;   TUBAL LIGATION  1991   VAULT SUSPENSION (DERMAL GRAFT)/ ENTEROCELE AND RECTOCELE REPAIR  12-05-2005   VAULT PROLAPSE/ ENTERCELE/ RECTOCELE   Patient Active Problem List   Diagnosis Date Noted   Brittle nails 10/22/2022   Cardiovascular risk factor 09/30/2022   Encounter for preventive care 09/30/2022   Prediabetes 09/16/2022   Depression screening 09/16/2022   SOB (shortness of breath) on exertion 09/16/2022   Other fatigue 09/16/2022   Recurrent kidney stones 09/01/2022   Tremor 05/07/2021   Class 1 obesity with serious comorbidity and body mass index (BMI) of 31.0 to 31.9 in adult 05/07/2021   Thrombocytopenia (HCC) 05/04/2021   History of renal stone 05/24/2020   History of DVT of lower extremity 10/23/2017   Nocturnal muscle cramps 10/23/2017   History of colonic polyps 05/21/2016   S/P laparoscopic cholecystectomy March 2015 09/03/2013   Hyperlipidemia with target LDL less than 100 06/27/2011   Herpes 12/16/2010   GERD (gastroesophageal reflux disease) 12/16/2010   HH (hiatus hernia) 12/16/2010   Hot flashes, menopausal 12/16/2010   Diverticulosis 12/16/2010   Asthma in adult, mild intermittent, uncomplicated 12/16/2010   BCE (basal cell epithelioma), 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  Fluid intake: Yes: water    PAIN:  Are you having pain? Yes NPRS scale: 2/10 Pain location:  inguinal left  Pain type: aching and dull Pain description: intermittent   Aggravating factors: coughing, turning and lifting, touching it Relieving factors: ibuprofen  PRECAUTIONS: None  WEIGHT BEARING RESTRICTIONS: No  FALLS:  Has patient fallen in last 6 months? No  LIVING ENVIRONMENT: Lives with: lives with their spouse Lives in: House/apartment   OCCUPATION: retired  PLOF: Independent  PATIENT GOALS: pain and frequency, urge, leakage  PERTINENT HISTORY:  Hysterectomy, bladder tacked 2x Sexual abuse: No  BOWEL MOVEMENT: Pain with bowel movement: No Type of bowel movement:Type (Bristol Stool Scale) soft, Frequency daily, and Strain No Fully empty rectum: Yes:   Leakage: No Pads: No Fiber supplement:   URINATION: Pain with urination: No Fully empty bladder: No Stream: Weak Urgency: Yes:   Frequency: going every 2/hour, nocturia 2-3x Leakage: Urge to void, Walking to the bathroom, Coughing, Sneezing, and Exercise Pads: Yes: 1 am and 1 pm  INTERCOURSE: Pain with intercourse:  No   PREGNANCY: Vaginal deliveries 2 Tearing No C-section deliveries 1   PROLAPSE: None   OBJECTIVE:   DIAGNOSTIC FINDINGS:    PATIENT SURVEYS:    PFIQ-7   COGNITION: Overall cognitive  status: Within functional limits for tasks assessed     SENSATION: Light touch: Appears intact Proprioception:   MUSCLE LENGTH: Hamstrings: Right 80 deg; Left 80 deg Thomas test:   LUMBAR SPECIAL TESTS:  Straight leg raise test: Negative  FUNCTIONAL TESTS:    GAIT:  Comments: WFL   POSTURE: rounded shoulders, anterior pelvic tilt, and flexed trunk   PELVIC ALIGNMENT: normal  LUMBARAROM/PROM: WFL   A/PROM A/PROM  eval  Flexion 75%  Extension   Right lateral flexion 75%  Left lateral flexion 75%  Right rotation   Left rotation    (Blank rows = not tested)  LOWER EXTREMITY ROM: WFL   Passive ROM Right eval Left eval  Hip flexion    Hip extension    Hip abduction    Hip adduction    Hip internal rotation    Hip external rotation    Knee flexion    Knee extension    Ankle dorsiflexion    Ankle plantarflexion    Ankle inversion    Ankle eversion     (Blank rows = not tested)  LOWER EXTREMITY MMT:  MMT Right eval Left eval  Hip flexion  4/5  Hip extension    Hip abduction 4/5 4/5  Hip adduction 4/5 4/5  Hip internal rotation    Hip external rotation 4/5   Knee flexion    Knee extension    Ankle dorsiflexion    Ankle plantarflexion    Ankle inversion    Ankle eversion     PALPATION:   General  left lumbar paraspinals more tight than right, severe TTP ASIS and inguinal region                External Perineal Exam normal                             Internal Pelvic Floor tight levators with mild sensitivity slightly more on Left, contracted with inhale on deep inspiration  Patient confirms identification and approves PT to assess internal pelvic floor and treatment Yes  PELVIC  MMT:   MMT eval  Vaginal 2/5 x 5 with extra time, 1 sec hold  Internal Anal Sphincter   External Anal Sphincter   Puborectalis   Diastasis Recti   (Blank rows = not tested)        TONE: High Lt>Rt  PROLAPSE: no  TODAY'S TREATMENT:                                                                                                                               DATE:  11/20/22  Manual: Lt abdominal soft tissue mobilization/myofascial release above/below iliac crest Exercises: Crunch with ball roll up Reverse clam Sidelying hip abduction   10/23/22 Manual: Lt abdominal soft tissue mobilization/myofascial release above/below iliac crest Exercises: Wide foot/knee lower trunk rotation with 10 second holds 2 x 10 Modified thomas stretch 2 x 60 seconds Bridge 10x Supine march 2 x 10 Seated lateral flexion over peanut ball 2 x 60 seconds  10/13/22  NMRE: Breathing and bulging with stretches and transversus abdominus activaiton with exercises:  All stretches and exercises mentioned below as stated: - Supine Pelvic Floor Stretch  - 1 x daily - 7 x weekly - 1 sets - 3 reps - 30 sec hold - Supine Hip Internal and External Rotation  - 1 x daily - 7 x weekly - 1 sets - 10 reps - 5 sec hold - Supine Piriformis Stretch  - 1 x daily - 7 x weekly - 1 sets - 3 reps - 30 sec hold - Supine Transversus Abdominis Bracing with Double Leg Fallout  - 1 x daily - 7 x weekly - 2 sets - 10 reps - Dead Bug  - 1 x daily - 7 x weekly - 3 sets - 10 reps - Quadruped Circle Weight Shifts  - 1 x daily - 7 x weekly - 3 sets - 10 reps - Quadruped Thoracic Spine Extension  - 1 x daily - 7 x weekly - 3 sets - 10 reps  PATIENT EDUCATION:  Education details: Access Code: [email or web address] Person educated: Patient Education method: Explanation, Demonstration, Tactile cues, Verbal cues, and Handouts Education comprehension: verbalized understanding and returned demonstration  HOME EXERCISE PROGRAM: Access Code: [email or web address] URL: [email or web address] Date: 10/13/2022 Prepared by: Dwana Curd  Exercises - Supine Pelvic Floor Stretch  - 1 x daily - 7 x weekly - 1 sets - 3 reps - 30 sec hold - Supine Hip Internal and External Rotation  - 1 x daily - 7 x weekly - 1  sets - 10 reps - 5 sec hold - Supine Piriformis Stretch  - 1 x daily - 7 x weekly - 1 sets - 3 reps - 30 sec hold - Supine Transversus Abdominis Bracing with Double Leg Fallout  - 1 x daily - 7 x weekly - 2 sets - 10 reps - Dead Bug  - 1 x daily - 7 x weekly - 3 sets - 10 reps - [employer]  Weight Shifts  - 1 x daily - 7 x weekly - 3 sets - 10 reps - Quadruped Thoracic Spine Extension  - 1 x daily - 7 x weekly - 3 sets - 10 reps  ASSESSMENT:  CLINICAL IMPRESSION: Pt responded well to manual techniques previous visit.  Performed soft tissue work today as she was a little more flared up after some increased activity.   Pt continues to have tenderness of the left hip flexor and TFL.  Focused exercises on glutes and core for improved pelvic stability without overuse of hip flexors.  Pt will benefit from skilled PT to address impairments and improve function and pain.  OBJECTIVE IMPAIRMENTS: decreased coordination, decreased endurance, decreased ROM, decreased strength, increased fascial restrictions, increased muscle spasms, impaired flexibility, impaired tone, postural dysfunction, and pain.   ACTIVITY LIMITATIONS: lifting, sitting, continence, and toileting  PARTICIPATION LIMITATIONS: community activity  PERSONAL FACTORS: Time since onset of injury/illness/exacerbation and 3+ comorbidities: fell <1 year ago with broken ankle Rt; vaginal deliveries, c-section, bladder surgery, hysterectomy  are also affecting patient's functional outcome.   REHAB POTENTIAL: Excellent  CLINICAL DECISION MAKING: Evolving/moderate complexity  EVALUATION COMPLEXITY: Moderate   GOALS: Goals reviewed with patient? Yes  SHORT TERM GOALS: Target date: 10/29/22  Ind with correct muscle movement of core with breathing Baseline: Goal status: IN PROGRESS  2.  Urge techniques known Baseline:  Goal status: MET  3.  Ind with initial core strengthening Baseline:  Goal status: IN PROGRESS  4.  Pain reduced  by 25% Baseline: 40% better pain wasn't bothering as much, mowed last Saturday and dumping yard clippings and that irritated it again Goal status: MET   LONG TERM GOALS: Target date: 12/24/22  Pt will be independent with advanced HEP to maintain improvements made throughout therapy  Baseline:  Goal status: INITIAL  2.  Pt will report 75% reduction of pain due to improvements in posture, strength, and muscle length  Baseline:  Goal status: INITIAL  3.  Pt will have to use 0-1 pads per day  Baseline: same Goal status: INITIAL  4.  Pt will be able to functional actions such as walking to the bathroom without leakage  Baseline:  Goal status: INITIAL  5.  Pt will have 50% less urgency due to bladder retraining and strengthening  Baseline:  Goal status: INITIAL  6.  Pt will have nocturia decreased to 1/night at most  Baseline: currently 1-2 (down from 3-5) Goal status: INITIAL  PLAN:  PT FREQUENCY: 1x/week  PT DURATION: 12 weeks  PLANNED INTERVENTIONS: Therapeutic exercises, Therapeutic activity, Neuromuscular re-education, Balance training, Gait training, Patient/Family education, Self Care, Joint mobilization, Dry Needling, Electrical stimulation, Cryotherapy, Moist heat, Taping, Biofeedback, Manual therapy, and Re-evaluation  PLAN FOR NEXT SESSION: Work on posterior mobility and strength; continue manual techniques to Lt lower quadrant if tolerated well; updated HEP depending on tolerance; address goals.    Russella Dar, PT, DPT 11/21/22 3:13 PM

## 2022-11-21 ENCOUNTER — Encounter: Payer: Self-pay | Admitting: Physical Therapy

## 2022-11-26 ENCOUNTER — Ambulatory Visit: Payer: PPO | Attending: Obstetrics & Gynecology | Admitting: Physical Therapy

## 2022-11-26 ENCOUNTER — Encounter: Payer: Self-pay | Admitting: Physical Therapy

## 2022-11-26 DIAGNOSIS — M6281 Muscle weakness (generalized): Secondary | ICD-10-CM | POA: Diagnosis not present

## 2022-11-26 DIAGNOSIS — R279 Unspecified lack of coordination: Secondary | ICD-10-CM

## 2022-11-26 DIAGNOSIS — M62838 Other muscle spasm: Secondary | ICD-10-CM

## 2022-11-26 NOTE — Therapy (Signed)
OUTPATIENT PHYSICAL THERAPY FEMALE PELVIC TREATMENT   Patient Name: Carol [name] MRN: [id number] DOB:03-18-1951, 72 y.o., female Today's Date: 11/26/2022  END OF SESSION:  PT End of Session - 11/26/22 1231     Visit Number 5    Date for PT Re-Evaluation 12/24/22    Authorization Type [employer]    PT Start Time 1231    PT Stop Time 1313    PT Time Calculation (min) 42 min    Activity Tolerance Patient tolerated treatment well    Behavior During Therapy WFL for tasks assessed/performed                Past Medical History:  Diagnosis Date   Abnormal Pap smear of cervix    in 20's   Anxiety    claustrophobic   Arthritis    "neck, hands" (09/02/2013)   Asthma    Basal cell carcinoma of forehead 2008; 2014   Dizziness last occurance 10-15-2014   on occasion, not related to activity   DVT (deep venous thrombosis) (HCC) 06/28/2014   Dr Susann Givens, right leg, behind knee   Environmental allergies    Exercise-induced asthma 30's   Gallbladder problem    GERD (gastroesophageal reflux disease)    H/O hiatal hernia    Herpes simplex 1976   History of colonic polyps 05/21/2016   History of kidney stones    Hx of blood clots    Hyperlipidemia    IBS (irritable bowel syndrome)    Joint pain    Kidney problem    Migraine teens, returned in 40"s   "depends on the change in the weather; last one was 1 wk ago" (09/02/2013)   Obesity    Osteoarthritis    Plantar fasciitis    PONV (postoperative nausea and vomiting) 08/2013   ponv after gallblaffer   Prediabetes    Right ureteral stone    Seasonal allergic rhinitis    SOB (shortness of breath)    Tubular adenoma of colon 05/22/2017   per path report.    Tubular adenoma of colon 06/29/2020   Past Surgical History:  Procedure Laterality Date   ABDOMINAL HYSTERECTOMY  1992   CESAREAN SECTION  1981   CHOLECYSTECTOMY N/A 09/02/2013   Procedure: LAPAROSCOPIC CHOLECYSTECTOMY;  Surgeon: Shelly Rubenstein, MD;   Location: [facility];  Service: General;  Laterality: N/A;   CRYOTHERAPY     for abnormal pap smear in 20's   CYSTOSCOPY W/ URETERAL STENT PLACEMENT  03/05/2012   Procedure: CYSTOSCOPY WITH RETROGRADE PYELOGRAM/URETERAL STENT PLACEMENT;  Surgeon: Lindaann Slough, MD;  Location: [facility];  Service: Urology;  Laterality: Right;   CYSTOSCOPY W/ URETERAL STENT PLACEMENT Right 01/19/2015   Procedure: CYSTOSCOPY WITH RETROGRADE PYELOGRAM/URETERAL  RIGHT STENT PLACEMENT;  Surgeon: Sebastian Ache, MD;  Location: [facility];  Service: Urology;  Laterality: Right;   CYSTOSCOPY WITH RETROGRADE PYELOGRAM, URETEROSCOPY AND STENT PLACEMENT Right 09/08/2014   Procedure: CYSTOSCOPY WITH RIGHT  RETROGRADE PYELOGRAM, URETEROSCOPY AND RIGHT STENT PLACEMENT;  Surgeon: Heloise Purpura, MD;  Location: [facility];  Service: Urology;  Laterality: Right;   CYSTOSCOPY WITH RETROGRADE PYELOGRAM, URETEROSCOPY AND STENT PLACEMENT Right 10/19/2014   Procedure: CYSTOSCOPY WITH Bilateral RETROGRADE PYELOGRAM/RIGHT URETEROSCOPY,RIGHT JJ STENT;  Surgeon: Jethro Bolus, MD;  Location: [facility];  Service: Urology;  Laterality: Right;   CYSTOSCOPY/RETROGRADE/URETEROSCOPY  11/18/2011   Procedure: CYSTOSCOPY/RETROGRADE/URETEROSCOPY;  Surgeon: Kathi Ludwig, MD;  Location: [facility];  Service: Urology;  Laterality: Right;   CYSTOSCOPY/RETROGRADE/URETEROSCOPY/STONE EXTRACTION WITH BASKET  10/21/2011  Procedure: CYSTOSCOPY/RETROGRADE/URETEROSCOPY/STONE EXTRACTION WITH BASKET;  Surgeon: Kathi Ludwig, MD;  Location: [facility];  Service: Urology;  Laterality: Right;   CYSTOSCOPY/RETROGRADE/URETEROSCOPY/STONE EXTRACTION WITH BASKET Right 09/10/2012   Procedure: RIGHT URETEROSCOPY/STONE EXTRACTION/LASER LITHOTRIPSY/POSSIBLE DOUBLE J STENT PLACEMENT;  Surgeon: Kathi Ludwig, MD;  Location: [facility];  Service: Urology;  Laterality: Right;   CYSTOSCOPY/URETEROSCOPY/HOLMIUM LASER/STENT PLACEMENT Left 05/02/2021    Procedure: CYSTOSCOPY/URETEROSCOPY/HOLMIUM LASER/STENT PLACEMENT;  Surgeon: Jerilee Field, MD;  Location: [facility];  Service: Urology;  Laterality: Left;   MOHS SURGERY  2008; 2014   PLANTAR FASCIA SURGERY  05/2016   POSTERIOR PINNACLE MESH SACROSPINOUS REPAIR  11-22-2010   RECURRENT ENTEROCELE   RIGHT URETEROSCOPIC STONE EXTRACTION W/ STENT PLACEMENT  08-12-2011   x2 stents   ROBOTICALLY ASSISTED LAPAROSCOPIC URETERAL RE-IMPLANTATION Right 01/19/2015   Procedure: ROBOTICALLY ASSISTED LAPAROSCOPIC URETERAL RE-IMPLANTATION RIGHT;  Surgeon: Sebastian Ache, MD;  Location: [facility];  Service: Urology;  Laterality: Right;   TUBAL LIGATION  1991   VAULT SUSPENSION (DERMAL GRAFT)/ ENTEROCELE AND RECTOCELE REPAIR  12-05-2005   VAULT PROLAPSE/ ENTERCELE/ RECTOCELE   Patient Active Problem List   Diagnosis Date Noted   Brittle nails 10/22/2022   Cardiovascular risk factor 09/30/2022   Encounter for preventive care 09/30/2022   Prediabetes 09/16/2022   Depression screening 09/16/2022   SOB (shortness of breath) on exertion 09/16/2022   Other fatigue 09/16/2022   Recurrent kidney stones 09/01/2022   Tremor 05/07/2021   Class 1 obesity with serious comorbidity and body mass index (BMI) of 31.0 to 31.9 in adult 05/07/2021   Thrombocytopenia (HCC) 05/04/2021   History of renal stone 05/24/2020   History of DVT of lower extremity 10/23/2017   Nocturnal muscle cramps 10/23/2017   History of colonic polyps 05/21/2016   S/P laparoscopic cholecystectomy March 2015 09/03/2013   Hyperlipidemia with target LDL less than 100 06/27/2011   Herpes 12/16/2010   GERD (gastroesophageal reflux disease) 12/16/2010   HH (hiatus hernia) 12/16/2010   Hot flashes, menopausal 12/16/2010   Diverticulosis 12/16/2010   Asthma in adult, mild intermittent, uncomplicated 12/16/2010   BCE (basal cell epithelioma), 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  Just a little sore when I touch it but not as bad  Fluid intake: Yes: water    PAIN:  Are you having pain? No   PRECAUTIONS: None  WEIGHT BEARING RESTRICTIONS: No  FALLS:  Has patient fallen in last 6 months? No  LIVING ENVIRONMENT: Lives with: lives with their spouse Lives in: House/apartment   OCCUPATION: retired  PLOF: Independent  PATIENT GOALS: pain and frequency, urge, leakage  PERTINENT HISTORY:  Hysterectomy, bladder tacked 2x Sexual abuse: No  BOWEL MOVEMENT: Pain with bowel movement: No Type of bowel movement:Type (Bristol Stool Scale) soft, Frequency daily, and Strain No Fully empty rectum: Yes:   Leakage: No Pads: No Fiber supplement:   URINATION: Pain with urination: No Fully empty bladder: No Stream: Weak Urgency: Yes:   Frequency: going every 2/hour, nocturia 2-3x Leakage: Urge to void, Walking to the bathroom, Coughing, Sneezing, and Exercise Pads: Yes: 1 am and 1 pm  INTERCOURSE: Pain with intercourse:  No   PREGNANCY: Vaginal deliveries 2 Tearing No C-section deliveries 1   PROLAPSE: None   OBJECTIVE:   DIAGNOSTIC FINDINGS:    PATIENT SURVEYS:    PFIQ-7   COGNITION: Overall cognitive status: Within functional limits for tasks assessed     SENSATION: Light touch: Appears intact Proprioception:   MUSCLE LENGTH: Hamstrings: Right 80 deg; Left 80 deg Thomas test:   LUMBAR SPECIAL TESTS:  Straight leg raise test: Negative  FUNCTIONAL  TESTS:    GAIT:  Comments: WFL   POSTURE: rounded shoulders, anterior pelvic tilt, and flexed trunk   PELVIC ALIGNMENT: normal  LUMBARAROM/PROM: WFL   A/PROM A/PROM  eval  Flexion 75%  Extension   Right lateral flexion 75%  Left lateral flexion 75%  Right rotation   Left rotation    (Blank rows = not tested)  LOWER EXTREMITY ROM: WFL   Passive ROM Right eval Left eval  Hip flexion    Hip extension    Hip abduction    Hip adduction    Hip internal rotation    Hip external rotation    Knee flexion    Knee extension    Ankle dorsiflexion    Ankle plantarflexion    Ankle inversion    Ankle eversion     (Blank rows = not tested)  LOWER EXTREMITY MMT:  MMT Right eval Left eval  Hip flexion  4/5  Hip extension    Hip abduction 4/5 4/5  Hip adduction 4/5 4/5  Hip internal rotation    Hip external rotation 4/5   Knee flexion    Knee extension    Ankle dorsiflexion    Ankle plantarflexion    Ankle inversion    Ankle eversion     PALPATION:   General  left lumbar paraspinals more tight than right, severe TTP ASIS and inguinal region                External Perineal Exam normal                             Internal Pelvic Floor tight levators with mild sensitivity slightly more on Left, contracted with inhale on deep inspiration  Patient confirms identification and approves PT to assess internal pelvic floor and treatment Yes  PELVIC MMT:   MMT eval  Vaginal 2/5 x 5 with extra time, 1 sec hold  Internal Anal Sphincter   External Anal Sphincter  Puborectalis   Diastasis Recti   (Blank rows = not tested)        TONE: High Lt>Rt  PROLAPSE: no  TODAY'S TREATMENT:                                                                                                                              DATE:  11/26/22 Exercise: Hip abduction 2 ways- neutral and internal rotation - 10x each way Bridge with blue band - 10x March with blue band in hooklying  20x Heel slide in supine - 15x bil Modified dead lift - rolling peanut on mat Hip machine 15lb - ext, abdction, flexion15 reps Shoulder band - red - walk outs and green - shoulder ext - 20x each Shoulder ext with march - 10x each side     11/20/22  Manual: Lt abdominal soft tissue mobilization/myofascial release above/below iliac crest Exercises: Crunch with ball roll up Reverse clam Sidelying hip abduction   10/23/22 Manual: Lt abdominal soft tissue mobilization/myofascial release above/below iliac crest Exercises: Wide foot/knee lower trunk rotation with 10 second holds 2 x 10 Modified thomas stretch 2 x 60 seconds Bridge 10x Supine march 2 x 10 Seated lateral flexion over peanut ball 2 x 60 seconds   PATIENT EDUCATION:  Education details: Access Code: [email or web address] Person educated: Patient Education method: Programmer, multimedia, Demonstration, Actor cues, Verbal cues, and Handouts Education comprehension: verbalized understanding and returned demonstration  HOME EXERCISE PROGRAM: Access Code: [email or web address] URL: [email or web address] Date: 10/13/2022 Prepared by: Dwana Curd  Exercises - Supine Pelvic Floor Stretch  - 1 x daily - 7 x weekly - 1 sets - 3 reps - 30 sec hold - Supine Hip Internal and External Rotation  - 1 x daily - 7 x weekly - 1 sets - 10 reps - 5 sec hold - Supine Piriformis Stretch  - 1 x daily - 7 x weekly - 1 sets - 3 reps - 30 sec hold - Supine Transversus Abdominis Bracing with Double Leg Fallout  - 1 x daily - 7 x weekly - 2 sets - 10 reps - Dead Bug  - 1 x daily - 7 x weekly - 3 sets - 10 reps - Quadruped Circle Weight Shifts  - 1 x daily - 7 x weekly - 3 sets - 10 reps - Quadruped Thoracic Spine Extension  - 1 x daily - 7 x weekly - 3 sets - 10 reps  ASSESSMENT:  CLINICAL IMPRESSION: Pt was able to progress strength today and added exercises to HEP.  Pt has difficulty sitting up out of bed and demonstrates more weakness in the left  hip.  Pt will benefit from skilled PT to continue working on strength deficits for full return to functional activities without flared up hip/groin pain.  OBJECTIVE IMPAIRMENTS: decreased coordination, decreased endurance, decreased ROM, decreased strength, increased fascial restrictions, increased muscle spasms, impaired flexibility, impaired tone, postural dysfunction, and pain.   ACTIVITY LIMITATIONS: lifting, sitting, continence, and  toileting  PARTICIPATION LIMITATIONS: community activity  PERSONAL FACTORS: Time since onset of injury/illness/exacerbation and 3+ comorbidities: fell <1 year ago with broken ankle Rt; vaginal deliveries, c-section, bladder surgery, hysterectomy  are also affecting patient's functional outcome.   REHAB POTENTIAL: Excellent  CLINICAL DECISION MAKING: Evolving/moderate complexity  EVALUATION COMPLEXITY: Moderate   GOALS: Goals reviewed with patient? Yes  SHORT TERM GOALS: Target date: 10/29/22 Updated 11/26/22  Ind with correct muscle movement of core with breathing Baseline: Goal status: MET  2.  Urge techniques known Baseline:  Goal status: MET  3.  Ind with initial core strengthening Baseline:  Goal status: MET  4.  Pain reduced by 25% Baseline: 40% better pain wasn't bothering as much, mowed last Saturday and dumping yard clippings and that irritated it again Goal status: MET   LONG TERM GOALS: Target date: 12/24/22 Update 11/26/22  Pt will be independent with advanced HEP to maintain improvements made throughout therapy  Baseline:  Goal status: IN PROGRESS  2.  Pt will report 75% reduction of pain due to improvements in posture, strength, and muscle length  Baseline:  Goal status: IN PROGRESS  3.  Pt will have to use 0-1 pads per day  Baseline: same Goal status: IN PROGRESS  4.  Pt will be able to functional actions such as walking to the bathroom without leakage  Baseline:  Goal status: IN PROGRESS  5.  Pt will have 50% less  urgency due to bladder retraining and strengthening  Baseline:  Goal status: IN PROGRESS  6.  Pt will have nocturia decreased to 1/night at most  Baseline: currently 1-2 (down from 3-5) Goal status: IN PROGRESS  PLAN:  PT FREQUENCY: 1x/week  PT DURATION: 12 weeks  PLANNED INTERVENTIONS: Therapeutic exercises, Therapeutic activity, Neuromuscular re-education, Balance training, Gait training, Patient/Family education, Self Care, Joint mobilization, Dry Needling, Electrical stimulation, Cryotherapy, Moist heat, Taping, Biofeedback, Manual therapy, and Re-evaluation  PLAN FOR NEXT SESSION: hip and core strength progression and f/u on new exercises from HEP added previous session.    Russella Dar, PT, DPT 11/26/22 1:55 PM

## 2022-12-02 DIAGNOSIS — G43719 Chronic migraine without aura, intractable, without status migrainosus: Secondary | ICD-10-CM | POA: Diagnosis not present

## 2022-12-03 ENCOUNTER — Encounter: Payer: Self-pay | Admitting: Physical Therapy

## 2022-12-03 ENCOUNTER — Ambulatory Visit: Payer: PPO | Admitting: Physical Therapy

## 2022-12-03 DIAGNOSIS — M6281 Muscle weakness (generalized): Secondary | ICD-10-CM

## 2022-12-03 DIAGNOSIS — M62838 Other muscle spasm: Secondary | ICD-10-CM

## 2022-12-03 DIAGNOSIS — R279 Unspecified lack of coordination: Secondary | ICD-10-CM

## 2022-12-03 NOTE — Therapy (Signed)
OUTPATIENT PHYSICAL THERAPY FEMALE PELVIC TREATMENT   Patient Name: Carol [name] MRN: [id number] DOB:October 04, 1950, 72 y.o., female Today's Date: 12/03/2022  END OF SESSION:  PT End of Session - 12/03/22 1323     Visit Number 6    Date for PT Re-Evaluation 12/24/22    Authorization Type [employer]    PT Start Time 1231    PT Stop Time 1310    PT Time Calculation (min) 39 min    Activity Tolerance Patient tolerated treatment well    Behavior During Therapy WFL for tasks assessed/performed                 Past Medical History:  Diagnosis Date   Abnormal Pap smear of cervix    in 20's   Anxiety    claustrophobic   Arthritis    "neck, hands" (09/02/2013)   Asthma    Basal cell carcinoma of forehead 2008; 2014   Dizziness last occurance 10-15-2014   on occasion, not related to activity   DVT (deep venous thrombosis) (HCC) 06/28/2014   Dr Susann Givens, right leg, behind knee   Environmental allergies    Exercise-induced asthma 30's   Gallbladder problem    GERD (gastroesophageal reflux disease)    H/O hiatal hernia    Herpes simplex 1976   History of colonic polyps 05/21/2016   History of kidney stones    Hx of blood clots    Hyperlipidemia    IBS (irritable bowel syndrome)    Joint pain    Kidney problem    Migraine teens, returned in 40"s   "depends on the change in the weather; last one was 1 wk ago" (09/02/2013)   Obesity    Osteoarthritis    Plantar fasciitis    PONV (postoperative nausea and vomiting) 08/2013   ponv after gallblaffer   Prediabetes    Right ureteral stone    Seasonal allergic rhinitis    SOB (shortness of breath)    Tubular adenoma of colon 05/22/2017   per path report.    Tubular adenoma of colon 06/29/2020   Past Surgical History:  Procedure Laterality Date   ABDOMINAL HYSTERECTOMY  1992   CESAREAN SECTION  1981   CHOLECYSTECTOMY N/A 09/02/2013   Procedure: LAPAROSCOPIC CHOLECYSTECTOMY;  Surgeon: Shelly Rubenstein, MD;   Location: [facility];  Service: General;  Laterality: N/A;   CRYOTHERAPY     for abnormal pap smear in 20's   CYSTOSCOPY W/ URETERAL STENT PLACEMENT  03/05/2012   Procedure: CYSTOSCOPY WITH RETROGRADE PYELOGRAM/URETERAL STENT PLACEMENT;  Surgeon: Lindaann Slough, MD;  Location: [facility];  Service: Urology;  Laterality: Right;   CYSTOSCOPY W/ URETERAL STENT PLACEMENT Right 01/19/2015   Procedure: CYSTOSCOPY WITH RETROGRADE PYELOGRAM/URETERAL  RIGHT STENT PLACEMENT;  Surgeon: Sebastian Ache, MD;  Location: [facility];  Service: Urology;  Laterality: Right;   CYSTOSCOPY WITH RETROGRADE PYELOGRAM, URETEROSCOPY AND STENT PLACEMENT Right 09/08/2014   Procedure: CYSTOSCOPY WITH RIGHT  RETROGRADE PYELOGRAM, URETEROSCOPY AND RIGHT STENT PLACEMENT;  Surgeon: Heloise Purpura, MD;  Location: [facility];  Service: Urology;  Laterality: Right;   CYSTOSCOPY WITH RETROGRADE PYELOGRAM, URETEROSCOPY AND STENT PLACEMENT Right 10/19/2014   Procedure: CYSTOSCOPY WITH Bilateral RETROGRADE PYELOGRAM/RIGHT URETEROSCOPY,RIGHT JJ STENT;  Surgeon: Jethro Bolus, MD;  Location: [facility];  Service: Urology;  Laterality: Right;   CYSTOSCOPY/RETROGRADE/URETEROSCOPY  11/18/2011   Procedure: CYSTOSCOPY/RETROGRADE/URETEROSCOPY;  Surgeon: Kathi Ludwig, MD;  Location: [facility];  Service: Urology;  Laterality: Right;   CYSTOSCOPY/RETROGRADE/URETEROSCOPY/STONE EXTRACTION WITH BASKET  10/21/2011   Procedure: CYSTOSCOPY/RETROGRADE/URETEROSCOPY/STONE EXTRACTION WITH BASKET;  Surgeon: Kathi Ludwig, MD;  Location: [facility];  Service: Urology;  Laterality: Right;   CYSTOSCOPY/RETROGRADE/URETEROSCOPY/STONE EXTRACTION WITH BASKET Right 09/10/2012   Procedure: RIGHT URETEROSCOPY/STONE EXTRACTION/LASER LITHOTRIPSY/POSSIBLE DOUBLE J STENT PLACEMENT;  Surgeon: Kathi Ludwig, MD;  Location: [facility];  Service: Urology;  Laterality: Right;   CYSTOSCOPY/URETEROSCOPY/HOLMIUM LASER/STENT PLACEMENT Left 05/02/2021    Procedure: CYSTOSCOPY/URETEROSCOPY/HOLMIUM LASER/STENT PLACEMENT;  Surgeon: Jerilee Field, MD;  Location: [facility];  Service: Urology;  Laterality: Left;   MOHS SURGERY  2008; 2014   PLANTAR FASCIA SURGERY  05/2016   POSTERIOR PINNACLE MESH SACROSPINOUS REPAIR  11-22-2010   RECURRENT ENTEROCELE   RIGHT URETEROSCOPIC STONE EXTRACTION W/ STENT PLACEMENT  08-12-2011   x2 stents   ROBOTICALLY ASSISTED LAPAROSCOPIC URETERAL RE-IMPLANTATION Right 01/19/2015   Procedure: ROBOTICALLY ASSISTED LAPAROSCOPIC URETERAL RE-IMPLANTATION RIGHT;  Surgeon: Sebastian Ache, MD;  Location: [facility];  Service: Urology;  Laterality: Right;   TUBAL LIGATION  1991   VAULT SUSPENSION (DERMAL GRAFT)/ ENTEROCELE AND RECTOCELE REPAIR  12-05-2005   VAULT PROLAPSE/ ENTERCELE/ RECTOCELE   Patient Active Problem List   Diagnosis Date Noted   Brittle nails 10/22/2022   Cardiovascular risk factor 09/30/2022   Encounter for preventive care 09/30/2022   Prediabetes 09/16/2022   Depression screening 09/16/2022   SOB (shortness of breath) on exertion 09/16/2022   Other fatigue 09/16/2022   Recurrent kidney stones 09/01/2022   Tremor 05/07/2021   Class 1 obesity with serious comorbidity and body mass index (BMI) of 31.0 to 31.9 in adult 05/07/2021   Thrombocytopenia (HCC) 05/04/2021   History of renal stone 05/24/2020   History of DVT of lower extremity 10/23/2017   Nocturnal muscle cramps 10/23/2017   History of colonic polyps 05/21/2016   S/P laparoscopic cholecystectomy March 2015 09/03/2013   Hyperlipidemia with target LDL less than 100 06/27/2011   Herpes 12/16/2010   GERD (gastroesophageal reflux disease) 12/16/2010   HH (hiatus hernia) 12/16/2010   Hot flashes, menopausal 12/16/2010   Diverticulosis 12/16/2010   Asthma in adult, mild intermittent, uncomplicated 12/16/2010   BCE (basal cell epithelioma), 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  Just a little sore when I touch it but not as bad  Fluid intake: Yes: water    PAIN:  Are you having pain? No   PRECAUTIONS: None  WEIGHT BEARING RESTRICTIONS: No  FALLS:  Has patient fallen in last 6 months? No  LIVING ENVIRONMENT: Lives with: lives with their spouse Lives in: House/apartment   OCCUPATION: retired  PLOF: Independent  PATIENT GOALS: pain and frequency, urge, leakage  PERTINENT HISTORY:  Hysterectomy, bladder tacked 2x Sexual abuse: No  BOWEL MOVEMENT: Pain with bowel movement: No Type of bowel movement:Type (Bristol Stool Scale) soft, Frequency daily, and Strain No Fully empty rectum: Yes:   Leakage: No Pads: No Fiber supplement:   URINATION: Pain with urination: No Fully empty bladder: No Stream: Weak Urgency: Yes:   Frequency: going every 2/hour, nocturia 2-3x Leakage: Urge to void, Walking to the bathroom, Coughing, Sneezing, and Exercise Pads: Yes: 1 am and 1 pm  INTERCOURSE: Pain with intercourse:  No   PREGNANCY: Vaginal deliveries 2 Tearing No C-section deliveries 1   PROLAPSE: None   OBJECTIVE:   DIAGNOSTIC FINDINGS:    PATIENT SURVEYS:    PFIQ-7   COGNITION: Overall cognitive status: Within functional limits for tasks assessed     SENSATION: Light touch: Appears intact Proprioception:   MUSCLE LENGTH: Hamstrings: Right 80 deg; Left 80 deg Thomas test:   LUMBAR SPECIAL TESTS:  Straight leg raise test: Negative  FUNCTIONAL  TESTS:    GAIT:  Comments: WFL   POSTURE: rounded shoulders, anterior pelvic tilt, and flexed trunk   PELVIC ALIGNMENT: normal  LUMBARAROM/PROM: WFL   A/PROM A/PROM  eval  Flexion 75%  Extension   Right lateral flexion 75%  Left lateral flexion 75%  Right rotation   Left rotation    (Blank rows = not tested)  LOWER EXTREMITY ROM: WFL   Passive ROM Right eval Left eval  Hip flexion    Hip extension    Hip abduction    Hip adduction    Hip internal rotation    Hip external rotation    Knee flexion    Knee extension    Ankle dorsiflexion    Ankle plantarflexion    Ankle inversion    Ankle eversion     (Blank rows = not tested)  LOWER EXTREMITY MMT:  MMT Right eval Left eval Left (pain free) 12/03/22   Hip flexion  4/5 5/5  Hip extension   5  Hip abduction 4/5 4/5 4/5  Hip adduction 4/5 4/5 4/5  Hip internal rotation   5  Hip external rotation 4/5  5  Knee flexion   5  Knee extension   5  Ankle dorsiflexion     Ankle plantarflexion     Ankle inversion     Ankle eversion      PALPATION:   General  left lumbar paraspinals more tight than right, severe TTP ASIS and inguinal region                External Perineal Exam normal                             Internal Pelvic Floor tight levators with mild sensitivity slightly more on Left, contracted with inhale on deep inspiration  Patient confirms identification and approves PT to assess internal pelvic floor and treatment Yes  PELVIC MMT:   MMT eval  Vaginal 2/5 x  5 with extra time, 1 sec hold  Internal Anal Sphincter   External Anal Sphincter   Puborectalis   Diastasis Recti   (Blank rows = not tested)        TONE: High Lt>Rt  PROLAPSE: no  TODAY'S TREATMENT:                                                                                                                              DATE:   12/03/22 Exercise: SLR 10 x bil Bridge with blue band - 10x March with blue band in hooklying  20x Heel slide in supine - 15x bil Lying on peanut on mat - quadruped - UE reach, LE reach, UE/LE  Hip machine 25lb - ext, abdction, flexion - 15 reps Palpation and gentle soft tissue to glute med and lumbar paraspinals - discussed dry needling but would rather not, very averse to needles  11/26/22 Exercise: Hip abduction 2 ways- neutral and internal rotation - 10x each way Bridge with blue band - 10x March with blue band in hooklying 20x Heel slide in supine - 15x bil Modified dead lift - rolling peanut on mat Hip machine 15lb - ext, abdction, flexion15 reps Shoulder band - red - walk outs and green - shoulder ext - 20x each Shoulder ext with march - 10x each side   11/20/22  Manual: Lt abdominal soft tissue mobilization/myofascial release above/below iliac crest Exercises: Crunch with ball roll up Reverse clam Sidelying hip abduction     PATIENT EDUCATION:  Education details: Access Code: [email or web address] Person educated: Patient Education method: Programmer, multimedia, Demonstration, Actor cues, Verbal cues, and Handouts Education comprehension: verbalized understanding and returned demonstration  HOME EXERCISE PROGRAM: Access Code: [email or web address] URL: [email or web address] Date: 10/13/2022 Prepared by: Dwana Curd  Exercises - Supine Pelvic Floor Stretch  - 1 x daily - 7 x weekly - 1 sets - 3 reps - 30 sec hold - Supine Hip Internal and External Rotation  - 1 x daily - 7 x weekly - 1 sets - 10 reps - 5 sec hold - Supine Piriformis Stretch  - 1 x daily - 7 x weekly - 1 sets - 3 reps - 30 sec hold - Supine Transversus Abdominis Bracing with Double Leg Fallout  - 1 x daily - 7 x weekly - 2 sets - 10 reps - Dead Bug  - 1 x daily - 7 x weekly - 3 sets - 10 reps - Quadruped Circle Weight Shifts  - 1 x daily - 7 x weekly - 3 sets - 10 reps - Quadruped Thoracic Spine Extension  - 1 x daily - 7 x weekly - 3 sets - 10 reps  ASSESSMENT:  CLINICAL IMPRESSION: Pt still very  tender to palpation around iliac crest, glue med, ASIS, and L4/5 paraspinals on left side. Pt has 5/5 hip strength no pain other than abduction and adduction of the left LE.  Pt had no increased pain during today's treatment  but needs some modifications due to wrist pain - qped on forearems.  Pt will benefit from skilled PT as she is still having pain and demostrates core weakness.  Pt has pain with left hip pain with FADIR test but no other movement causing pain.  Continue skilled PT to strengthen core and hip recommended.  OBJECTIVE IMPAIRMENTS: decreased coordination, decreased endurance, decreased ROM, decreased strength, increased fascial restrictions, increased muscle spasms, impaired flexibility, impaired tone, postural dysfunction, and pain.   ACTIVITY LIMITATIONS: lifting, sitting, continence, and toileting  PARTICIPATION LIMITATIONS: community activity  PERSONAL FACTORS: Time since onset of injury/illness/exacerbation and 3+ comorbidities: fell <1 year ago with broken ankle Rt; vaginal deliveries, c-section, bladder surgery, hysterectomy  are also affecting patient's functional outcome.   REHAB POTENTIAL: Excellent  CLINICAL DECISION MAKING: Evolving/moderate complexity  EVALUATION COMPLEXITY: Moderate   GOALS: Goals reviewed with patient? Yes  SHORT TERM GOALS: Target date: 10/29/22 Updated 11/26/22  Ind with correct muscle movement of core with breathing Baseline: Goal status: MET  2.  Urge techniques known Baseline:  Goal status: MET  3.  Ind with initial core strengthening Baseline:  Goal status: MET  4.  Pain reduced by 25% Baseline: 40% better pain wasn't bothering as much, mowed last Saturday and dumping yard clippings and that irritated it again Goal status: MET   LONG TERM GOALS: Target date: 12/24/22 Update 11/26/22  Pt will be independent with advanced HEP to maintain improvements made throughout therapy  Baseline:  Goal status: IN PROGRESS  2.  Pt will  report 75% reduction of pain due to improvements in posture, strength, and muscle length  Baseline:  Goal status: IN PROGRESS  3.  Pt will have to use 0-1 pads per day  Baseline: same Goal status: IN PROGRESS  4.  Pt will be able to functional actions such as walking to the bathroom without leakage  Baseline:  Goal status: IN PROGRESS  5.  Pt will have 50% less urgency due to bladder retraining and strengthening  Baseline:  Goal status: IN PROGRESS  6.  Pt will have nocturia decreased to 1/night at most  Baseline: currently 1-2 (down from 3-5) Goal status: IN PROGRESS  PLAN:  PT FREQUENCY: 1x/week  PT DURATION: 12 weeks  PLANNED INTERVENTIONS: Therapeutic exercises, Therapeutic activity, Neuromuscular re-education, Balance training, Gait training, Patient/Family education, Self Care, Joint mobilization, Dry Needling, Electrical stimulation, Cryotherapy, Moist heat, Taping, Biofeedback, Manual therapy, and Re-evaluation  PLAN FOR NEXT SESSION: hip and core strength progression and retest TTP glute med and hip flexor, lumbar paraspinals try addaday if tender or dry needling if pt is willing   Russella Dar, PT, DPT 12/03/22 1:24 PM

## 2022-12-10 ENCOUNTER — Ambulatory Visit: Payer: PPO | Admitting: Physical Therapy

## 2022-12-10 DIAGNOSIS — M6281 Muscle weakness (generalized): Secondary | ICD-10-CM | POA: Diagnosis not present

## 2022-12-10 DIAGNOSIS — M62838 Other muscle spasm: Secondary | ICD-10-CM

## 2022-12-10 DIAGNOSIS — R279 Unspecified lack of coordination: Secondary | ICD-10-CM

## 2022-12-10 NOTE — Therapy (Signed)
OUTPATIENT PHYSICAL THERAPY FEMALE PELVIC TREATMENT   Patient Name: Carol [name] MRN: [id number] DOB:Oct 21, 1950, 72 y.o., female Today's Date: 12/10/2022  END OF SESSION:  PT End of Session - 12/10/22 1103     Visit Number 7    Date for PT Re-Evaluation 12/24/22    Authorization Type [employer]    PT Start Time 1103    PT Stop Time 1143    PT Time Calculation (min) 40 min    Activity Tolerance Patient tolerated treatment well    Behavior During Therapy WFL for tasks assessed/performed                  Past Medical History:  Diagnosis Date   Abnormal Pap smear of cervix    in 20's   Anxiety    claustrophobic   Arthritis    "neck, hands" (09/02/2013)   Asthma    Basal cell carcinoma of forehead 2008; 2014   Dizziness last occurance 10-15-2014   on occasion, not related to activity   DVT (deep venous thrombosis) (HCC) 06/28/2014   Dr Susann Givens, right leg, behind knee   Environmental allergies    Exercise-induced asthma 30's   Gallbladder problem    GERD (gastroesophageal reflux disease)    H/O hiatal hernia    Herpes simplex 1976   History of colonic polyps 05/21/2016   History of kidney stones    Hx of blood clots    Hyperlipidemia    IBS (irritable bowel syndrome)    Joint pain    Kidney problem    Migraine teens, returned in 40"s   "depends on the change in the weather; last one was 1 wk ago" (09/02/2013)   Obesity    Osteoarthritis    Plantar fasciitis    PONV (postoperative nausea and vomiting) 08/2013   ponv after gallblaffer   Prediabetes    Right ureteral stone    Seasonal allergic rhinitis    SOB (shortness of breath)    Tubular adenoma of colon 05/22/2017   per path report.    Tubular adenoma of colon 06/29/2020   Past Surgical History:  Procedure Laterality Date   ABDOMINAL HYSTERECTOMY  1992   CESAREAN SECTION  1981   CHOLECYSTECTOMY N/A 09/02/2013   Procedure: LAPAROSCOPIC CHOLECYSTECTOMY;  Surgeon: Shelly Rubenstein,  MD;  Location: [facility];  Service: General;  Laterality: N/A;   CRYOTHERAPY     for abnormal pap smear in 20's   CYSTOSCOPY W/ URETERAL STENT PLACEMENT  03/05/2012   Procedure: CYSTOSCOPY WITH RETROGRADE PYELOGRAM/URETERAL STENT PLACEMENT;  Surgeon: Lindaann Slough, MD;  Location: [facility];  Service: Urology;  Laterality: Right;   CYSTOSCOPY W/ URETERAL STENT PLACEMENT Right 01/19/2015   Procedure: CYSTOSCOPY WITH RETROGRADE PYELOGRAM/URETERAL  RIGHT STENT PLACEMENT;  Surgeon: Sebastian Ache, MD;  Location: [facility];  Service: Urology;  Laterality: Right;   CYSTOSCOPY WITH RETROGRADE PYELOGRAM, URETEROSCOPY AND STENT PLACEMENT Right 09/08/2014   Procedure: CYSTOSCOPY WITH RIGHT  RETROGRADE PYELOGRAM, URETEROSCOPY AND RIGHT STENT PLACEMENT;  Surgeon: Heloise Purpura, MD;  Location: [facility];  Service: Urology;  Laterality: Right;   CYSTOSCOPY WITH RETROGRADE PYELOGRAM, URETEROSCOPY AND STENT PLACEMENT Right 10/19/2014   Procedure: CYSTOSCOPY WITH Bilateral RETROGRADE PYELOGRAM/RIGHT URETEROSCOPY,RIGHT JJ STENT;  Surgeon: Jethro Bolus, MD;  Location: [facility];  Service: Urology;  Laterality: Right;   CYSTOSCOPY/RETROGRADE/URETEROSCOPY  11/18/2011   Procedure: CYSTOSCOPY/RETROGRADE/URETEROSCOPY;  Surgeon: Kathi Ludwig, MD;  Location: [facility];  Service: Urology;  Laterality: Right;   CYSTOSCOPY/RETROGRADE/URETEROSCOPY/STONE EXTRACTION WITH BASKET  10/21/2011   Procedure: CYSTOSCOPY/RETROGRADE/URETEROSCOPY/STONE EXTRACTION WITH BASKET;  Surgeon: Kathi Ludwig, MD;  Location: [facility];  Service: Urology;  Laterality: Right;   CYSTOSCOPY/RETROGRADE/URETEROSCOPY/STONE EXTRACTION WITH BASKET Right 09/10/2012   Procedure: RIGHT URETEROSCOPY/STONE EXTRACTION/LASER LITHOTRIPSY/POSSIBLE DOUBLE J STENT PLACEMENT;  Surgeon: Kathi Ludwig, MD;  Location: [facility];  Service: Urology;  Laterality: Right;   CYSTOSCOPY/URETEROSCOPY/HOLMIUM LASER/STENT PLACEMENT Left 05/02/2021    Procedure: CYSTOSCOPY/URETEROSCOPY/HOLMIUM LASER/STENT PLACEMENT;  Surgeon: Jerilee Field, MD;  Location: [facility];  Service: Urology;  Laterality: Left;   MOHS SURGERY  2008; 2014   PLANTAR FASCIA SURGERY  05/2016   POSTERIOR PINNACLE MESH SACROSPINOUS REPAIR  11-22-2010   RECURRENT ENTEROCELE   RIGHT URETEROSCOPIC STONE EXTRACTION W/ STENT PLACEMENT  08-12-2011   x2 stents   ROBOTICALLY ASSISTED LAPAROSCOPIC URETERAL RE-IMPLANTATION Right 01/19/2015   Procedure: ROBOTICALLY ASSISTED LAPAROSCOPIC URETERAL RE-IMPLANTATION RIGHT;  Surgeon: Sebastian Ache, MD;  Location: [facility];  Service: Urology;  Laterality: Right;   TUBAL LIGATION  1991   VAULT SUSPENSION (DERMAL GRAFT)/ ENTEROCELE AND RECTOCELE REPAIR  12-05-2005   VAULT PROLAPSE/ ENTERCELE/ RECTOCELE   Patient Active Problem List   Diagnosis Date Noted   Brittle nails 10/22/2022   Cardiovascular risk factor 09/30/2022   Encounter for preventive care 09/30/2022   Prediabetes 09/16/2022   Depression screening 09/16/2022   SOB (shortness of breath) on exertion 09/16/2022   Other fatigue 09/16/2022   Recurrent kidney stones 09/01/2022   Tremor 05/07/2021   Class 1 obesity with serious comorbidity and body mass index (BMI) of 31.0 to 31.9 in adult 05/07/2021   Thrombocytopenia (HCC) 05/04/2021   History of renal stone 05/24/2020   History of DVT of lower extremity 10/23/2017   Nocturnal muscle cramps 10/23/2017   History of colonic polyps 05/21/2016   S/P laparoscopic cholecystectomy March 2015 09/03/2013   Hyperlipidemia with target LDL less than 100 06/27/2011   Herpes 12/16/2010   GERD (gastroesophageal reflux disease) 12/16/2010   HH (hiatus hernia) 12/16/2010   Hot flashes, menopausal 12/16/2010   Diverticulosis 12/16/2010   Asthma in adult, mild intermittent, uncomplicated 12/16/2010   BCE (basal cell epithelioma), 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  Pt states no pain only when pressing on it and it is much better.  Fluid intake: Yes: water    PAIN:  Are you having pain? No   PRECAUTIONS: None  WEIGHT BEARING RESTRICTIONS: No  FALLS:  Has patient fallen in last 6 months? No  LIVING ENVIRONMENT: Lives with: lives with their spouse Lives in: House/apartment   OCCUPATION: retired  PLOF: Independent  PATIENT GOALS: pain and frequency, urge, leakage  PERTINENT HISTORY:  Hysterectomy, bladder tacked 2x Sexual abuse: No  BOWEL MOVEMENT: Pain with bowel movement: No Type of bowel movement:Type (Bristol Stool Scale) soft, Frequency daily, and Strain No Fully empty rectum: Yes:   Leakage: No Pads: No Fiber supplement:   URINATION: Pain with urination: No Fully empty bladder: No Stream: Weak Urgency: Yes:   Frequency: going every 2/hour, nocturia 2-3x Leakage: Urge to void, Walking to the bathroom, Coughing, Sneezing, and Exercise Pads: Yes: 1 am and 1 pm  INTERCOURSE: Pain with intercourse:  No   PREGNANCY: Vaginal deliveries 2 Tearing No C-section deliveries 1   PROLAPSE: None   OBJECTIVE:   DIAGNOSTIC FINDINGS:    PATIENT SURVEYS:    PFIQ-7   COGNITION: Overall cognitive status: Within functional limits for tasks assessed     SENSATION: Light touch: Appears intact Proprioception:   MUSCLE LENGTH: Hamstrings: Right 80 deg; Left 80 deg Thomas test:   LUMBAR SPECIAL  TESTS:  Straight leg raise test: Negative  FUNCTIONAL TESTS:    GAIT:  Comments: WFL   POSTURE: rounded shoulders, anterior pelvic tilt, and flexed trunk   PELVIC ALIGNMENT: normal  LUMBARAROM/PROM: WFL   A/PROM A/PROM  eval  Flexion 75%  Extension   Right lateral flexion 75%  Left lateral flexion 75%  Right rotation   Left rotation    (Blank rows = not tested)  LOWER EXTREMITY ROM: WFL   Passive ROM Right eval Left eval  Hip flexion    Hip extension    Hip abduction    Hip adduction    Hip internal rotation    Hip external rotation    Knee flexion    Knee extension    Ankle dorsiflexion    Ankle plantarflexion    Ankle inversion    Ankle eversion     (Blank rows = not tested)  LOWER EXTREMITY MMT:  MMT Right eval Left eval Left (pain free) 12/03/22   Hip flexion  4/5 5/5  Hip extension   5  Hip abduction 4/5 4/5 4/5  Hip adduction 4/5 4/5 4/5  Hip internal rotation   5  Hip external rotation 4/5  5  Knee flexion   5  Knee extension   5  Ankle dorsiflexion     Ankle plantarflexion     Ankle inversion     Ankle eversion      PALPATION:   General  left lumbar paraspinals more tight than right, severe TTP ASIS and inguinal region                External Perineal Exam normal                             Internal Pelvic Floor tight levators with mild sensitivity slightly more on Left, contracted with inhale on deep inspiration  Patient confirms identification and approves PT to assess internal pelvic floor and treatment Yes  PELVIC  MMT:   MMT eval  Vaginal 2/5 x 5 with extra time, 1 sec hold  Internal Anal Sphincter   External Anal Sphincter   Puborectalis   Diastasis Recti   (Blank rows = not tested)        TONE: High Lt>Rt  PROLAPSE: no  TODAY'S TREATMENT:                                                                                                                              DATE:   12/10/22 Exercise:  Bridge with blue  band - 10x Lying on peanut on mat - quadruped - UE reach, LE reach kickout and donkey kick - 10x each side  Step forward retroactive transversus abdominus activation Squat at bar UE use for stability and training correct movement pattern Squat with 10 lb kettle bell Hip machine 25lb - ext, abdction, flexion - 15 reps Manual: Palpation and gentle soft tissue and fascial release to glute med and lumbar paraspinals    12/03/22 Exercise: SLR 10 x bil Bridge with blue band - 10x March with blue band in hooklying 20x Heel slide in supine - 15x bil Lying on peanut on mat - quadruped - UE reach, LE reach, UE/LE  Hip machine 25lb - ext, abdction, flexion - 15 reps Palpation and gentle soft tissue to glute med and lumbar paraspinals - discussed dry needling but would rather not, very averse to needles  11/26/22 Exercise: Hip abduction 2 ways- neutral and internal rotation - 10x each way Bridge with blue band - 10x March with blue band in hooklying 20x Heel slide in supine - 15x bil Modified dead lift - rolling peanut on mat Hip machine 15lb - ext, abdction, flexion15 reps Shoulder band - red - walk outs and green - shoulder ext - 20x each Shoulder ext with march - 10x each side     PATIENT EDUCATION:  Education details: Access Code: [email or web address] Person educated: Patient Education method: Explanation, Demonstration, Actor cues, Verbal cues, and Handouts Education comprehension: verbalized understanding and returned demonstration  HOME EXERCISE PROGRAM: Access Code: [email or web address] URL: [email or web address] Date: 10/13/2022 Prepared by: Dwana Curd  Exercises - Supine Pelvic Floor Stretch  - 1 x daily - 7 x weekly - 1 sets - 3 reps - 30 sec hold - Supine Hip Internal and External Rotation  - 1 x daily - 7 x weekly - 1 sets - 10 reps - 5 sec hold - Supine Piriformis Stretch  - 1 x daily - 7 x weekly - 1 sets - 3 reps - 30 sec hold - Supine Transversus Abdominis Bracing  with Double Leg Fallout  - 1 x daily - 7 x weekly - 2 sets - 10 reps - Dead Bug  - 1 x daily - 7 x weekly - 3 sets - 10 reps - Quadruped Circle Weight Shifts  - 1 x daily - 7 x weekly - 3 sets - 10 reps - Quadruped  Thoracic Spine Extension  - 1 x daily - 7 x weekly - 3 sets - 10 reps  ASSESSMENT:  CLINICAL IMPRESSION: Pt able to progress strength today.  Still tender left glute med.  Pt had improved soft tissue length with fascial releas and decreased tenderness.  Continue with strengthening for functional activities witout pain.  OBJECTIVE IMPAIRMENTS: decreased coordination, decreased endurance, decreased ROM, decreased strength, increased fascial restrictions, increased muscle spasms, impaired flexibility, impaired tone, postural dysfunction, and pain.   ACTIVITY LIMITATIONS: lifting, sitting, continence, and toileting  PARTICIPATION LIMITATIONS: community activity  PERSONAL FACTORS: Time since onset of injury/illness/exacerbation and 3+ comorbidities: fell <1 year ago with broken ankle Rt; vaginal deliveries, c-section, bladder surgery, hysterectomy  are also affecting patient's functional outcome.   REHAB POTENTIAL: Excellent  CLINICAL DECISION MAKING: Evolving/moderate complexity  EVALUATION COMPLEXITY: Moderate   GOALS: Goals reviewed with patient? Yes  SHORT TERM GOALS: Target date: 10/29/22 Updated 11/26/22  Ind with correct muscle movement of core with breathing Baseline: Goal status: MET  2.  Urge techniques known Baseline:  Goal status: MET  3.  Ind with initial core strengthening Baseline:  Goal status: MET  4.  Pain reduced by 25% Baseline: 40% better pain wasn't bothering as much, mowed last Saturday and dumping yard clippings and that irritated it again Goal status: MET   LONG TERM GOALS: Target date: 12/24/22 Update 11/26/22  Pt will be independent with advanced HEP to maintain improvements made throughout therapy  Baseline:  Goal status: IN  PROGRESS  2.  Pt will report 75% reduction of pain due to improvements in posture, strength, and muscle length  Baseline:  Goal status: IN PROGRESS  3.  Pt will have to use 0-1 pads per day  Baseline: same Goal status: IN PROGRESS  4.  Pt will be able to functional actions such as walking to the bathroom without leakage  Baseline:  Goal status: IN PROGRESS  5.  Pt will have 50% less urgency due to bladder retraining and strengthening  Baseline:  Goal status: IN PROGRESS  6.  Pt will have nocturia decreased to 1/night at most  Baseline: currently 1-2 (down from 3-5) Goal status: IN PROGRESS  PLAN:  PT FREQUENCY: 1x/week  PT DURATION: 12 weeks  PLANNED INTERVENTIONS: Therapeutic exercises, Therapeutic activity, Neuromuscular re-education, Balance training, Gait training, Patient/Family education, Self Care, Joint mobilization, Dry Needling, Electrical stimulation, Cryotherapy, Moist heat, Taping, Biofeedback, Manual therapy, and Re-evaluation  PLAN FOR NEXT SESSION: hip and core strength progression and retest TTP glute med and hip flexor, lumbar paraspinals fascial release, f/u on goals   Russella Dar, PT, DPT 12/10/22 11:03 AM

## 2022-12-18 ENCOUNTER — Ambulatory Visit (INDEPENDENT_AMBULATORY_CARE_PROVIDER_SITE_OTHER): Payer: PPO | Admitting: Internal Medicine

## 2022-12-18 ENCOUNTER — Encounter (INDEPENDENT_AMBULATORY_CARE_PROVIDER_SITE_OTHER): Payer: Self-pay | Admitting: Internal Medicine

## 2022-12-18 VITALS — BP 97/67 | HR 72 | Temp 98.0°F | Ht 60.0 in | Wt 150.0 lb

## 2022-12-18 DIAGNOSIS — R7303 Prediabetes: Secondary | ICD-10-CM

## 2022-12-18 DIAGNOSIS — E669 Obesity, unspecified: Secondary | ICD-10-CM

## 2022-12-18 DIAGNOSIS — E668 Other obesity: Secondary | ICD-10-CM | POA: Diagnosis not present

## 2022-12-18 DIAGNOSIS — E785 Hyperlipidemia, unspecified: Secondary | ICD-10-CM

## 2022-12-18 DIAGNOSIS — Z9189 Other specified personal risk factors, not elsewhere classified: Secondary | ICD-10-CM

## 2022-12-18 DIAGNOSIS — Z6829 Body mass index (BMI) 29.0-29.9, adult: Secondary | ICD-10-CM

## 2022-12-18 NOTE — Assessment & Plan Note (Signed)
Most recent A1c is  Lab Results  Component Value Date   HGBA1C 5.8 (H) 08/18/2022   HGBA1C 5.6 02/22/2013  . Patient informed of disease state and risk of progression. This may contribute to abnormal cravings, fatigue and diabetes complications without having diabetes.   She has lost 10% of total body weight.  We will repeat hemoglobin A1c around September.  She may also be a candidate for pharmacoprophylaxis with metformin.  We discussed benefits and side effects.  She will continue with nutritional and behavioral strategies.

## 2022-12-18 NOTE — Progress Notes (Signed)
Office: [phone]  /  Fax: [phone]  WEIGHT SUMMARY AND BIOMETRICS  Vitals Temp: 98 F (36.7 C) BP: 97/67 Pulse Rate: 72 SpO2: 95 %   Anthropometric Measurements Height: 5' (1.524 m) Weight: 150 lb (68 kg) BMI (Calculated): 29.3 Weight at Last Visit: 152lb Weight Lost Since Last Visit: 2lb Weight Gained Since Last Visit: 0 Starting Weight: 159lb Total Weight Loss (lbs): 9 lb (4.082 kg) Peak Weight: 172lb   Body Composition  Body Fat %: 40.4 % Fat Mass (lbs): 61 lbs Muscle Mass (lbs): 85.2 lbs Total Body Water (lbs): 59.2 lbs Visceral Fat Rating : 11    No data recorded Today's Visit #: 5  Starting Date: 09/16/22   HPI  Chief Complaint: OBESITY  Carol [name] is here to discuss her progress with her obesity treatment plan. She is on the keeping a food journal and adhering to recommended goals of 1000 calories and 23-36 protein and states she is following her eating plan approximately 75 % of the time. She states she is exercising doing chair yoga and using hand weights also walking 60 minutes 4 times per week.  Interval History:  Since last office visit she has lost 2 pounds.  Thus far she has lost 10% of total body weight.  She is preserving muscle mass. She reports good adherence to reduced calorie nutritional plan. She has been working on reading food labels, not skipping meals, increasing protein intake at every meal, eating more fruits, eating more vegetables, drinking more water, continues to exercise, and will be starting on a walking program with her friend.  Orixegenic Control: Denies problems with appetite and hunger signals.  Denies problems with satiety and satiation.  Denies problems with eating patterns and portion control.  Denies abnormal cravings. Denies feeling deprived or restricted.   Barriers identified: none.   Pharmacotherapy for weight loss: She is currently taking no anti-obesity medication.    ASSESSMENT AND PLAN  TREATMENT  PLAN FOR OBESITY:  Recommended Dietary Goals  [name] is currently in the action stage of change. As such, her goal is to continue weight management plan. She has agreed to: continue current plan  Behavioral Intervention  We discussed the following Behavioral Modification Strategies today: increasing lean protein intake, decreasing simple carbohydrates , increasing vegetables, increasing lower glycemic fruits, increasing fiber rich foods, increasing water intake, continue to practice mindfulness when eating, and planning for success.  Additional resources provided today:  We discussed FITTE as a way to monitor and work on exercise goals pertaining to walking and aerobic activity.  Recommended Physical Activity Goals  [name] has been advised to work up to 150 minutes of moderate intensity aerobic activity a week and strengthening exercises 2-3 times per week for cardiovascular health, weight loss maintenance and preservation of muscle mass.   She has agreed to :  Increase the intensity, frequency or duration of aerobic exercises    Pharmacotherapy We discussed various medication options to help [name] with her weight loss efforts and we both agreed to : continue with nutritional and behavioral strategies  ASSOCIATED CONDITIONS ADDRESSED TODAY  Cardiovascular risk factor Assessment & Plan: The 10-year ASCVD risk score (Arnett DK, et al., 2019) is: 7.9%  Patient counseled on cardiovascular risk.  She is currently on atorvastatin 20 mg once a day.  She may benefit from further lipid lowering but will continue to work with nutrition and behavioral strategies for now.  We will repeat lipids at the 68-month mark which would be around September 2024.  Prediabetes Assessment & Plan: Most recent A1c is  Lab Results  Component Value Date   HGBA1C 5.8 (H) 08/18/2022   HGBA1C 5.6 02/22/2013  . Patient informed of disease state and risk of progression. This may contribute to abnormal cravings,  fatigue and diabetes complications without having diabetes.   She has lost 10% of total body weight.  We will repeat hemoglobin A1c around September.  She may also be a candidate for pharmacoprophylaxis with metformin.  We discussed benefits and side effects.  She will continue with nutritional and behavioral strategies.    Class 1 obesity with serious comorbidity and body mass index (BMI) of 31.0 to 31.9 in adult, unspecified obesity type Assessment & Plan: She has lost 10% of total body weight via nutritional and behavioral strategies.  Her BIA shows a reduction in body fat percentage with relative preservation of muscle mass.   Hyperlipidemia with target LDL less than 100 Assessment & Plan: LDL is not at goal. Elevated LDL may be secondary to nutrition, genetics and spillover effect from excess adiposity. Recommended LDL goal is <70 to reduce the risk of fatty streaks and the progression to obstructive ASCVD in the future. Her 10 year risk is: The 10-year ASCVD risk score (Arnett DK, et al., 2019) is: 5%.  She is currently on atorvastatin 20 mg once a day  Lab Results  Component Value Date   CHOL 203 (H) 09/16/2022   HDL 59 09/16/2022   LDLCALC 113 (H) 09/16/2022   TRIG 181 (H) 09/16/2022   CHOLHDL 3.6 04/23/2022    Continue weight loss therapy, losing 10% or more of body weight may improve condition. Also advised to reduce saturated fats in diet to less than 10% of daily calories.  Continue statin therapy at current dose.  Repeat blood panel in 6 months, if LDL still above 100 at that time she may benefit from treatment intensification to reduce cardiovascular risk.        PHYSICAL EXAM:  Blood pressure 97/67, pulse 72, temperature 98 F (36.7 C), height 5' (1.524 m), weight 150 lb (68 kg), last menstrual period 06/23/1990, SpO2 95 %. Body mass index is 29.29 kg/m.  General: She is overweight, cooperative, alert, well developed, and in no acute distress. PSYCH: Has normal  mood, affect and thought process.   HEENT: EOMI, sclerae are anicteric. Lungs: Normal breathing effort, no conversational dyspnea. Extremities: No edema.  Neurologic: No gross sensory or motor deficits. No tremors or fasciculations noted.    DIAGNOSTIC DATA REVIEWED:  BMET    Component Value Date/Time   NA 142 09/16/2022 0933   K 4.5 09/16/2022 0933   CL 105 09/16/2022 0933   CO2 22 09/16/2022 0933   GLUCOSE 107 (H) 09/16/2022 0933   GLUCOSE 143 (H) 05/07/2021 0442   BUN 22 09/16/2022 0933   CREATININE 0.95 09/16/2022 0933   CREATININE 0.76 03/16/2017 1354   CALCIUM 9.5 09/16/2022 0933   GFRNONAA >60 05/07/2021 0442   GFRAA 79 10/21/2019 1326   Lab Results  Component Value Date   HGBA1C 5.8 (H) 08/18/2022   HGBA1C 5.6 02/22/2013   Lab Results  Component Value Date   INSULIN 11.1 09/16/2022   Lab Results  Component Value Date   TSH 2.380 08/18/2022   CBC    Component Value Date/Time   WBC 7.4 04/23/2022 1331   WBC 14.2 (H) 05/07/2021 0446   RBC 4.76 04/23/2022 1331   RBC 3.78 (L) 05/07/2021 0446   HGB 14.2 04/23/2022 1331  HGB 14.3 05/21/2016 1128   HCT 42.1 04/23/2022 1331   PLT 307 04/23/2022 1331   MCV 88 04/23/2022 1331   MCH 29.8 04/23/2022 1331   MCH 29.1 05/07/2021 0446   MCHC 33.7 04/23/2022 1331   MCHC 33.3 05/07/2021 0446   RDW 12.8 04/23/2022 1331   Iron Studies    Component Value Date/Time   IRON 47 10/22/2022 1430   TIBC 323 10/22/2022 1430   FERRITIN 166 (H) 10/22/2022 1430   IRONPCTSAT 15 10/22/2022 1430   Lipid Panel     Component Value Date/Time   CHOL 203 (H) 09/16/2022 0933   TRIG 181 (H) 09/16/2022 0933   HDL 59 09/16/2022 0933   CHOLHDL 3.6 04/23/2022 1331   CHOLHDL 3.2 05/21/2016 1407   VLDL 44 (H) 05/21/2016 1407   LDLCALC 113 (H) 09/16/2022 0933   Hepatic Function Panel     Component Value Date/Time   PROT 7.4 09/16/2022 0933   ALBUMIN 4.8 09/16/2022 0933   AST 24 09/16/2022 0933   ALT 25 09/16/2022 0933    ALKPHOS 103 09/16/2022 0933   BILITOT 0.2 09/16/2022 0933      Component Value Date/Time   TSH 2.380 08/18/2022 1310   Nutritional Lab Results  Component Value Date   VD25OH 51.2 04/23/2022   VD25OH 39.4 05/27/2017   VD25OH 31 05/21/2016     Return in about 3 weeks (around 01/08/2023) for For Weight Mangement with Dr. Rikki Spearing.Marland Kitchen She was informed of the importance of frequent follow up visits to maximize her success with intensive lifestyle modifications for her multiple health conditions.   ATTESTASTION STATEMENTS:  Reviewed by clinician on day of visit: allergies, medications, problem list, medical history, surgical history, family history, social history, and previous encounter notes.     Worthy Rancher, MD

## 2022-12-18 NOTE — Assessment & Plan Note (Signed)
LDL is not at goal. Elevated LDL may be secondary to nutrition, genetics and spillover effect from excess adiposity. Recommended LDL goal is <70 to reduce the risk of fatty streaks and the progression to obstructive ASCVD in the future. Her 10 year risk is: The 10-year ASCVD risk score (Arnett DK, et al., 2019) is: 5%.  She is currently on atorvastatin 20 mg once a day  Lab Results  Component Value Date   CHOL 203 (H) 09/16/2022   HDL 59 09/16/2022   LDLCALC 113 (H) 09/16/2022   TRIG 181 (H) 09/16/2022   CHOLHDL 3.6 04/23/2022    Continue weight loss therapy, losing 10% or more of body weight may improve condition. Also advised to reduce saturated fats in diet to less than 10% of daily calories.  Continue statin therapy at current dose.  Repeat blood panel in 6 months, if LDL still above 100 at that time she may benefit from treatment intensification to reduce cardiovascular risk.

## 2022-12-18 NOTE — Assessment & Plan Note (Addendum)
She has lost 10% of total body weight via nutritional and behavioral strategies.  Her BIA shows a reduction in body fat percentage with relative preservation of muscle mass.

## 2022-12-18 NOTE — Assessment & Plan Note (Signed)
The 10-year ASCVD risk score (Arnett DK, et al., 2019) is: 7.9%  Patient counseled on cardiovascular risk.  She is currently on atorvastatin 20 mg once a day.  She may benefit from further lipid lowering but will continue to work with nutrition and behavioral strategies for now.  We will repeat lipids at the 43-month mark which would be around September 2024.

## 2022-12-31 NOTE — Therapy (Signed)
OUTPATIENT PHYSICAL THERAPY FEMALE PELVIC TREATMENT   Patient Name: Carol [name] MRN: [id number] DOB:[birth year], 72 y.o., female Today's Date: 01/01/2023  END OF SESSION:  PT End of Session - 01/01/23 0818     Visit Number 8    Date for PT Re-Evaluation 01/29/23    Authorization Type [employer]    Progress Note Due on Visit 10    PT Start Time 0800    PT Stop Time 0844    PT Time Calculation (min) 44 min    Activity Tolerance Patient tolerated treatment well    Behavior During Therapy [facility] for tasks assessed/performed                   Past Medical History:  Diagnosis Date   Abnormal Pap smear of cervix    in 20's   Anxiety    claustrophobic   Arthritis    "neck, hands" (09/02/2013)   Asthma    Basal cell carcinoma of forehead 2008; 2014   Dizziness last occurance 10-15-2014   on occasion, not related to activity   DVT (deep venous thrombosis) (HCC) 06/28/2014   Dr Susann Givens, right leg, behind knee   Environmental allergies    Exercise-induced asthma 30's   Gallbladder problem    GERD (gastroesophageal reflux disease)    H/O hiatal hernia    Herpes simplex 1976   History of colonic polyps 05/21/2016   History of kidney stones    Hx of blood clots    Hyperlipidemia    IBS (irritable bowel syndrome)    Joint pain    Kidney problem    Migraine teens, returned in 40"s   "depends on the change in the weather; last one was 1 wk ago" (09/02/2013)   Obesity    Osteoarthritis    Plantar fasciitis    PONV (postoperative nausea and vomiting) 08/2013   ponv after gallblaffer   Prediabetes    Right ureteral stone    Seasonal allergic rhinitis    SOB (shortness of breath)    Tubular adenoma of colon 05/22/2017   per path report.    Tubular adenoma of colon 06/29/2020   Past Surgical History:  Procedure Laterality Date   ABDOMINAL HYSTERECTOMY  1992   CESAREAN SECTION  1981   CHOLECYSTECTOMY N/A 09/02/2013   Procedure: LAPAROSCOPIC  CHOLECYSTECTOMY;  Surgeon: Shelly Rubenstein, MD;  Location: [facility];  Service: General;  Laterality: N/A;   CRYOTHERAPY     for abnormal pap smear in 20's   CYSTOSCOPY W/ URETERAL STENT PLACEMENT  03/05/2012   Procedure: CYSTOSCOPY WITH RETROGRADE PYELOGRAM/URETERAL STENT PLACEMENT;  Surgeon: Lindaann Slough, MD;  Location: [facility];  Service: Urology;  Laterality: Right;   CYSTOSCOPY W/ URETERAL STENT PLACEMENT Right 01/19/2015   Procedure: CYSTOSCOPY WITH RETROGRADE PYELOGRAM/URETERAL  RIGHT STENT PLACEMENT;  Surgeon: Sebastian Ache, MD;  Location: [facility];  Service: Urology;  Laterality: Right;   CYSTOSCOPY WITH RETROGRADE PYELOGRAM, URETEROSCOPY AND STENT PLACEMENT Right 09/08/2014   Procedure: CYSTOSCOPY WITH RIGHT  RETROGRADE PYELOGRAM, URETEROSCOPY AND RIGHT STENT PLACEMENT;  Surgeon: Heloise Purpura, MD;  Location: [facility];  Service: Urology;  Laterality: Right;   CYSTOSCOPY WITH RETROGRADE PYELOGRAM, URETEROSCOPY AND STENT PLACEMENT Right 10/19/2014   Procedure: CYSTOSCOPY WITH Bilateral RETROGRADE PYELOGRAM/RIGHT URETEROSCOPY,RIGHT JJ STENT;  Surgeon: Jethro Bolus, MD;  Location: [facility];  Service: Urology;  Laterality: Right;   CYSTOSCOPY/RETROGRADE/URETEROSCOPY  11/18/2011   Procedure: CYSTOSCOPY/RETROGRADE/URETEROSCOPY;  Surgeon: Kathi Ludwig, MD;  Location: [facility];  Service:  Urology;  Laterality: Right;   CYSTOSCOPY/RETROGRADE/URETEROSCOPY/STONE EXTRACTION WITH BASKET  10/21/2011   Procedure: CYSTOSCOPY/RETROGRADE/URETEROSCOPY/STONE EXTRACTION WITH BASKET;  Surgeon: Kathi Ludwig, MD;  Location: [facility];  Service: Urology;  Laterality: Right;   CYSTOSCOPY/RETROGRADE/URETEROSCOPY/STONE EXTRACTION WITH BASKET Right 09/10/2012   Procedure: RIGHT URETEROSCOPY/STONE EXTRACTION/LASER LITHOTRIPSY/POSSIBLE DOUBLE J STENT PLACEMENT;  Surgeon: Kathi Ludwig, MD;  Location: [facility];  Service: Urology;  Laterality: Right;    CYSTOSCOPY/URETEROSCOPY/HOLMIUM LASER/STENT PLACEMENT Left 05/02/2021   Procedure: CYSTOSCOPY/URETEROSCOPY/HOLMIUM LASER/STENT PLACEMENT;  Surgeon: Jerilee Field, MD;  Location: [facility];  Service: Urology;  Laterality: Left;   MOHS SURGERY  2008; 2014   PLANTAR FASCIA SURGERY  05/2016   POSTERIOR PINNACLE MESH SACROSPINOUS REPAIR  11-22-2010   RECURRENT ENTEROCELE   RIGHT URETEROSCOPIC STONE EXTRACTION W/ STENT PLACEMENT  08-12-2011   x2 stents   ROBOTICALLY ASSISTED LAPAROSCOPIC URETERAL RE-IMPLANTATION Right 01/19/2015   Procedure: ROBOTICALLY ASSISTED LAPAROSCOPIC URETERAL RE-IMPLANTATION RIGHT;  Surgeon: Sebastian Ache, MD;  Location: [facility];  Service: Urology;  Laterality: Right;   TUBAL LIGATION  1991   VAULT SUSPENSION (DERMAL GRAFT)/ ENTEROCELE AND RECTOCELE REPAIR  12-05-2005   VAULT PROLAPSE/ ENTERCELE/ RECTOCELE   Patient Active Problem List   Diagnosis Date Noted   Brittle nails 10/22/2022   Cardiovascular risk factor 09/30/2022   Encounter for preventive care 09/30/2022   Prediabetes 09/16/2022   Depression screening 09/16/2022   SOB (shortness of breath) on exertion 09/16/2022   Other fatigue 09/16/2022   Recurrent kidney stones 09/01/2022   Tremor 05/07/2021   Class 1 obesity with serious comorbidity and body mass index (BMI) of 31.0 to 31.9 in adult 05/07/2021   Thrombocytopenia (HCC) 05/04/2021   History of renal stone 05/24/2020   History of DVT of lower extremity 10/23/2017   Nocturnal muscle cramps 10/23/2017   History of colonic polyps 05/21/2016   S/P laparoscopic cholecystectomy March 2015 09/03/2013   Hyperlipidemia with target LDL less than 100 06/27/2011   Herpes 12/16/2010   GERD (gastroesophageal reflux disease) 12/16/2010   HH (hiatus hernia) 12/16/2010   Hot flashes, menopausal 12/16/2010   Diverticulosis 12/16/2010   Asthma in adult, mild intermittent, uncomplicated 12/16/2010   BCE (basal cell epithelioma), 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  Pt states no pain only when pressing on it and it is much better.  Fluid intake: Yes: water    PAIN:  Are you having pain? No   PRECAUTIONS: None  WEIGHT BEARING RESTRICTIONS: No  FALLS:  Has patient fallen in last 6 months? No  LIVING ENVIRONMENT: Lives with: lives with their spouse Lives in: House/apartment   OCCUPATION: retired  PLOF: Independent  PATIENT GOALS: pain and frequency, urge, leakage  PERTINENT HISTORY:  Hysterectomy, bladder tacked 2x Sexual abuse: No  BOWEL MOVEMENT: Pain with bowel movement: No Type of bowel movement:Type (Bristol Stool Scale) soft, Frequency daily, and Strain No Fully empty rectum: Yes:   Leakage: No Pads: No Fiber supplement:   URINATION: Pain with urination: No Fully empty bladder: No Stream: Weak Urgency: Yes:   Frequency: going every 2/hour, nocturia 2-3x Leakage: Urge to void, Walking to the bathroom, Coughing, Sneezing, and Exercise Pads: Yes: 1 am and 1 pm  INTERCOURSE: Pain with intercourse:  No   PREGNANCY: Vaginal deliveries 2 Tearing No C-section deliveries 1   PROLAPSE: None   OBJECTIVE:   DIAGNOSTIC FINDINGS:    PATIENT SURVEYS:    PFIQ-7   COGNITION: Overall cognitive status: Within functional limits for tasks assessed     SENSATION: Light touch: Appears intact Proprioception:   MUSCLE  LENGTH: Hamstrings: Right 80 deg; Left 80 deg Thomas test:   LUMBAR SPECIAL TESTS:  Straight leg raise test: Negative  FUNCTIONAL TESTS:    GAIT:  Comments: WFL   POSTURE: rounded shoulders, anterior pelvic tilt, and flexed trunk   PELVIC ALIGNMENT: normal  LUMBARAROM/PROM: WFL   A/PROM A/PROM  eval  Flexion 75%  Extension   Right lateral flexion 75%  Left lateral flexion 75%  Right rotation   Left rotation    (Blank rows = not tested)  LOWER EXTREMITY ROM: WFL   Passive ROM Right eval Left eval  Hip flexion    Hip extension    Hip abduction    Hip adduction    Hip internal rotation    Hip external rotation    Knee flexion    Knee extension    Ankle dorsiflexion    Ankle plantarflexion    Ankle inversion    Ankle eversion     (Blank rows = not tested)  LOWER EXTREMITY MMT:  MMT Right eval Left eval Left (pain free) 12/03/22   Hip flexion  4/5 5/5  Hip extension   5  Hip abduction 4/5 4/5 4/5  Hip adduction 4/5 4/5 4/5  Hip internal rotation   5  Hip external rotation 4/5  5  Knee flexion   5  Knee extension   5  Ankle dorsiflexion     Ankle plantarflexion     Ankle inversion     Ankle eversion      PALPATION:   General  left lumbar paraspinals more tight than right, severe TTP ASIS and inguinal region                External Perineal Exam normal                             Internal Pelvic Floor tight levators with mild sensitivity slightly more on Left, contracted with inhale on deep inspiration  Patient confirms identification and approves PT to assess internal pelvic floor and treatment Yes  PELVIC  MMT:   MMT eval  Vaginal 2/5 x 5 with extra time, 1 sec hold  Internal Anal Sphincter   External Anal Sphincter   Puborectalis   Diastasis Recti   (Blank rows = not tested)        TONE: High Lt>Rt  PROLAPSE: no  TODAY'S TREATMENT:                                                                                                                               DATE:   01/01/23 Exercise:  Bridge with ball -  20x Kegel with hip IR - 20x Side lying hip abduction 15x bil Step up 6 inch and slow lower - 20x bil Step forward retroactive transversus abdominus activation - staying in that position with transversus abdominus activated then 20x quick flicks with exhales Dead lift 10 lb - 20x Goblet Squat with 10 lb weight Hip machine 25lb - ext, abdction, flexion - 15 reps bil  Palpation to glute med and TFL and hip flexors - all tender with light touch  12/10/22 Exercise:  Bridge with blue band - 10x Lying on peanut on mat - quadruped - UE reach, LE reach kickout and donkey kick - 10x each side  Step forward retroactive transversus abdominus activation Squat at bar UE use for stability and training correct movement pattern Squat with 10 lb kettle bell Hip machine 25lb - ext, abdction, flexion - 15 reps Manual: Palpation and gentle soft tissue and fascial release to glute med and lumbar paraspinals    12/03/22 Exercise: SLR 10 x bil Bridge with blue band - 10x March with blue band in hooklying 20x Heel slide in supine - 15x bil Lying on peanut on mat - quadruped - UE reach, LE reach, UE/LE  Hip machine 25lb - ext, abdction, flexion - 15 reps Palpation and gentle soft tissue to glute med and lumbar paraspinals - discussed dry needling but would rather not, very averse to needles       PATIENT EDUCATION:  Education details: Access Code: [email or web address] Person educated: Patient Education method: Explanation, Demonstration, Tactile cues, Verbal cues, and Handouts Education comprehension: verbalized understanding and returned demonstration  HOME EXERCISE PROGRAM: Access Code: [email or web address] URL: [email or web address] Date: 10/13/2022 Prepared by: Dwana Curd  Exercises - Supine Pelvic Floor Stretch  - 1 x daily - 7 x weekly - 1 sets - 3 reps - 30 sec hold - Supine Hip Internal and External  Rotation  - 1 x daily - 7 x weekly - 1 sets - 10 reps - 5 sec hold - Supine Piriformis Stretch  - 1 x daily - 7 x weekly - 1 sets - 3 reps - 30 sec hold - Supine Transversus Abdominis Bracing with Double Leg Fallout  - 1 x daily - 7 x weekly - 2 sets - 10 reps - Dead Bug  - 1 x daily - 7 x weekly - 3 sets - 10 reps - Quadruped  Circle Weight Shifts  - 1 x daily - 7 x weekly - 3 sets - 10 reps - Quadruped Thoracic Spine Extension  - 1 x daily - 7 x weekly - 3 sets - 10 reps  ASSESSMENT:  CLINICAL IMPRESSION: Today's session focused on core and pelvic floor strength.  Re-assessed soft tissue including palpation with tenderness to TFL, hip flexor and glute med all moderate to high level of tenderness. Pt will benefit from skilled PT to continue to progress strength.  She is making some progress with less nocturia, still having some leakage.  Exercise progressions are working on this. Pt will benefit from skilled PT to progress strength and added ionto to plan for pain management to reduce inflammation around the left muscle attachments.   OBJECTIVE IMPAIRMENTS: decreased coordination, decreased endurance, decreased ROM, decreased strength, increased fascial restrictions, increased muscle spasms, impaired flexibility, impaired tone, postural dysfunction, and pain.   ACTIVITY LIMITATIONS: lifting, sitting, continence, and toileting  PARTICIPATION LIMITATIONS: community activity  PERSONAL FACTORS: Time since onset of injury/illness/exacerbation and 3+ comorbidities: fell <1 year ago with broken ankle Rt; vaginal deliveries, c-section, bladder surgery, hysterectomy  are also affecting patient's functional outcome.   REHAB POTENTIAL: Excellent  CLINICAL DECISION MAKING: Evolving/moderate complexity  EVALUATION COMPLEXITY: Moderate   GOALS: Goals reviewed with patient? Yes  SHORT TERM GOALS: Target date: 10/29/22 Updated 11/26/22  Ind with correct muscle movement of core with  breathing Baseline: Goal status: MET  2.  Urge techniques known Baseline:  Goal status: MET  3.  Ind with initial core strengthening Baseline:  Goal status: MET  4.  Pain reduced by 25% Baseline: 40% better pain wasn't bothering as much, mowed last Saturday and dumping yard clippings and that irritated it again Goal status: MET   LONG TERM GOALS: Target date: 01/29/23 Update 01/01/23  Pt will be independent with advanced HEP to maintain improvements made throughout therapy  Baseline:  Goal status: IN PROGRESS  2.  Pt will report 75% reduction of pain due to improvements in posture, strength, and muscle length  Baseline: still a little pain but much better Goal status: IN PROGRESS  3.  Pt will have to use 0-1 pads per day  Baseline: same Goal status: IN PROGRESS  4.  Pt will be able to functional actions such as walking to the bathroom without leakage  Baseline: sometimes Goal status: IN PROGRESS  5.  Pt will have 50% less urgency due to bladder retraining and strengthening  Baseline: still has sometimes Goal status: IN PROGRESS  6.  Pt will have nocturia decreased to 1/night at most  Baseline: currently 1-2 (down from 3-5) Goal status: IN PROGRESS  PLAN:  PT FREQUENCY: 1x/week  PT DURATION: 12 weeks  PLANNED INTERVENTIONS: Therapeutic exercises, Therapeutic activity, Neuromuscular re-education, Balance training, Gait training, Patient/Family education, Self Care, Joint mobilization, Dry Needling, Electrical stimulation, Cryotherapy, Moist heat, Taping, Biofeedback, Ionotophoresis 4mg /ml Dexamethasone, Manual therapy, and Re-evaluation  PLAN FOR NEXT SESSION: check order for okay to use ionto and progress kegel with functional movements, hip and core strength progression and retest TTP glute med and hip flexor, lumbar paraspinals fascial release, f/u on goals   Russella Dar, PT, DPT 01/01/23 8:49 AM

## 2023-01-01 ENCOUNTER — Ambulatory Visit: Payer: PPO | Attending: Obstetrics & Gynecology | Admitting: Physical Therapy

## 2023-01-01 DIAGNOSIS — M6281 Muscle weakness (generalized): Secondary | ICD-10-CM | POA: Insufficient documentation

## 2023-01-01 DIAGNOSIS — M62838 Other muscle spasm: Secondary | ICD-10-CM | POA: Insufficient documentation

## 2023-01-01 DIAGNOSIS — R279 Unspecified lack of coordination: Secondary | ICD-10-CM | POA: Insufficient documentation

## 2023-01-08 ENCOUNTER — Other Ambulatory Visit: Payer: Self-pay | Admitting: Family Medicine

## 2023-01-08 ENCOUNTER — Ambulatory Visit
Admission: RE | Admit: 2023-01-08 | Discharge: 2023-01-08 | Disposition: A | Discharge status: Home / Self Care | Payer: PPO | Source: Ambulatory Visit | Attending: Family Medicine | Admitting: Family Medicine

## 2023-01-08 DIAGNOSIS — N6310 Unspecified lump in the right breast, unspecified quadrant: Secondary | ICD-10-CM

## 2023-01-08 DIAGNOSIS — R928 Other abnormal and inconclusive findings on diagnostic imaging of breast: Secondary | ICD-10-CM | POA: Diagnosis not present

## 2023-01-12 ENCOUNTER — Ambulatory Visit (INDEPENDENT_AMBULATORY_CARE_PROVIDER_SITE_OTHER): Payer: PPO | Admitting: Internal Medicine

## 2023-01-12 ENCOUNTER — Encounter (INDEPENDENT_AMBULATORY_CARE_PROVIDER_SITE_OTHER): Payer: Self-pay | Admitting: Internal Medicine

## 2023-01-12 VITALS — BP 114/78 | HR 78 | Temp 98.1°F | Ht 60.0 in | Wt 151.0 lb

## 2023-01-12 DIAGNOSIS — E669 Obesity, unspecified: Secondary | ICD-10-CM

## 2023-01-12 DIAGNOSIS — Z6831 Body mass index (BMI) 31.0-31.9, adult: Secondary | ICD-10-CM

## 2023-01-12 DIAGNOSIS — E785 Hyperlipidemia, unspecified: Secondary | ICD-10-CM

## 2023-01-12 DIAGNOSIS — R7303 Prediabetes: Secondary | ICD-10-CM | POA: Diagnosis not present

## 2023-01-12 NOTE — Assessment & Plan Note (Addendum)
On statin and fibrate.  Were checking fasting lipid panel today and his cardiovascular risk.  She may need treatment intensification if cardiovascular risk is elevated.  Continue with nutritional and behavioral strategies

## 2023-01-12 NOTE — Assessment & Plan Note (Signed)
Most recent A1c is  Lab Results  Component Value Date   HGBA1C 5.8 (H) 08/18/2022   HGBA1C 5.6 02/22/2013  . Patient informed of disease state and risk of progression. This may contribute to abnormal cravings, fatigue and diabetes complications without having diabetes.   She has lost 10% of total body weight.  We will check hemoglobin A1c, fasting blood sugar today.  We reviewed the benefits and side effects of pharmacoprophylaxis with metformin she is not interested in metformin or incretin therapy at present time.

## 2023-01-12 NOTE — Progress Notes (Signed)
Office: [phone]  /  Fax: [phone]  WEIGHT SUMMARY AND BIOMETRICS  Vitals Temp: 98.1 F (36.7 C) BP: 114/78 Pulse Rate: 78 SpO2: 98 %   Anthropometric Measurements Height: 5' (1.524 m) Weight: 151 lb (68.5 kg) BMI (Calculated): 29.49 Weight at Last Visit: 150 lb Weight Gained Since Last Visit: 1 lb Starting Weight: 159 lb Total Weight Loss (lbs): 8 lb (3.629 kg) Peak Weight: 172 lb   Body Composition  Body Fat %: 40.6 % Fat Mass (lbs): 61.4 lbs Muscle Mass (lbs): 85.4 lbs Total Body Water (lbs): 59 lbs Visceral Fat Rating : 11    No data recorded Today's Visit #: 6  Starting Date: 01/12/23   HPI  Chief Complaint: OBESITY  Carol [name] is here to discuss her progress with her obesity treatment plan. She is on the the Category 1 Plan and states she is following her eating plan approximately 50 % of the time. She states she is exercising 90 minutes 3 times per week.  Interval History:  Since last office visit she has gained 1 lbs. Brother-in-law passed away kidney transplant patient. Traveling back and forth from the hospital. Caused some deviation at more unhealthy foods. Getting back on track. She reports  improving. She has been working on getting back on track following recent lapse. Walking a lot more.  Orixegenic Control: Denies problems with appetite and hunger signals.  Denies problems with satiety and satiation.  Denies problems with eating patterns and portion control.  Denies abnormal cravings. Denies feeling deprived or restricted.   Barriers identified: multiple competing priorities.   Pharmacotherapy for weight loss: She is currently taking no anti-obesity medication.    ASSESSMENT AND PLAN  TREATMENT PLAN FOR OBESITY:  Recommended Dietary Goals  Carol [name] is currently in the action stage of change. As such, her goal is to continue weight management plan. She has agreed to: continue current plan  Behavioral Intervention  We discussed  the following Behavioral Modification Strategies today: increasing lean protein intake, decreasing simple carbohydrates , increasing vegetables, increasing lower glycemic fruits, increasing fiber rich foods, increasing water intake, continue to practice mindfulness when eating, planning for success, and staying on track while traveling and vacationing.  Additional resources provided today: None  Recommended Physical Activity Goals  Carol [name] has been advised to work up to 150 minutes of moderate intensity aerobic activity a week and strengthening exercises 2-3 times per week for cardiovascular health, weight loss maintenance and preservation of muscle mass.   She has agreed to :  Continue current level of physical activity   Pharmacotherapy We discussed various medication options to help Carol [name] with her weight loss efforts and we both agreed to : has declined pharmacotherapy with metformin she is also concerned about weight regain associated with discontinuation of incretin therapy.  ASSOCIATED CONDITIONS ADDRESSED TODAY  Prediabetes Assessment & Plan: Most recent A1c is  Lab Results  Component Value Date   HGBA1C 5.8 (H) 08/18/2022   HGBA1C 5.6 02/22/2013  . Patient informed of disease state and risk of progression. This may contribute to abnormal cravings, fatigue and diabetes complications without having diabetes.   She has lost 10% of total body weight.  We will check hemoglobin A1c, fasting blood sugar today.  We reviewed the benefits and side effects of pharmacoprophylaxis with metformin she is not interested in metformin or incretin therapy at present time.  Orders: -     Lipid Panel With LDL/HDL Ratio -     Hemoglobin A1c -  Glucose, fasting  Class 1 obesity with serious comorbidity and body mass index (BMI) of 31.0 to 31.9 in adult, unspecified obesity type  Hyperlipidemia with target LDL less than 100 Assessment & Plan: On statin and fibrate.  Were checking fasting lipid  panel today and his cardiovascular risk.  She may need treatment intensification if cardiovascular risk is elevated.  Continue with nutritional and behavioral strategies     PHYSICAL EXAM:  Blood pressure 114/78, pulse 78, temperature 98.1 F (36.7 C), height 5' (1.524 m), weight 151 lb (68.5 kg), last menstrual period 06/23/1990, SpO2 98%. Body mass index is 29.49 kg/m.  General: She is overweight, cooperative, alert, well developed, and in no acute distress. PSYCH: Has normal mood, affect and thought process.   HEENT: EOMI, sclerae are anicteric. Lungs: Normal breathing effort, no conversational dyspnea. Extremities: No edema.  Neurologic: No gross sensory or motor deficits. No tremors or fasciculations noted.    DIAGNOSTIC DATA REVIEWED:  BMET    Component Value Date/Time   NA 142 09/16/2022 0933   K 4.5 09/16/2022 0933   CL 105 09/16/2022 0933   CO2 22 09/16/2022 0933   GLUCOSE 107 (H) 09/16/2022 0933   GLUCOSE 143 (H) 05/07/2021 0442   BUN 22 09/16/2022 0933   CREATININE 0.95 09/16/2022 0933   CREATININE 0.76 03/16/2017 1354   CALCIUM 9.5 09/16/2022 0933   GFRNONAA >60 05/07/2021 0442   GFRAA 79 10/21/2019 1326   Lab Results  Component Value Date   HGBA1C 5.8 (H) 08/18/2022   HGBA1C 5.6 02/22/2013   Lab Results  Component Value Date   INSULIN 11.1 09/16/2022   Lab Results  Component Value Date   TSH 2.380 08/18/2022   CBC    Component Value Date/Time   WBC 7.4 04/23/2022 1331   WBC 14.2 (H) 05/07/2021 0446   RBC 4.76 04/23/2022 1331   RBC 3.78 (L) 05/07/2021 0446   HGB 14.2 04/23/2022 1331   HGB 14.3 05/21/2016 1128   HCT 42.1 04/23/2022 1331   PLT 307 04/23/2022 1331   MCV 88 04/23/2022 1331   MCH 29.8 04/23/2022 1331   MCH 29.1 05/07/2021 0446   MCHC 33.7 04/23/2022 1331   MCHC 33.3 05/07/2021 0446   RDW 12.8 04/23/2022 1331   Iron Studies    Component Value Date/Time   IRON 47 10/22/2022 1430   TIBC 323 10/22/2022 1430   FERRITIN 166  (H) 10/22/2022 1430   IRONPCTSAT 15 10/22/2022 1430   Lipid Panel     Component Value Date/Time   CHOL 203 (H) 09/16/2022 0933   TRIG 181 (H) 09/16/2022 0933   HDL 59 09/16/2022 0933   CHOLHDL 3.6 04/23/2022 1331   CHOLHDL 3.2 05/21/2016 1407   VLDL 44 (H) 05/21/2016 1407   LDLCALC 113 (H) 09/16/2022 0933   Hepatic Function Panel     Component Value Date/Time   PROT 7.4 09/16/2022 0933   ALBUMIN 4.8 09/16/2022 0933   AST 24 09/16/2022 0933   ALT 25 09/16/2022 0933   ALKPHOS 103 09/16/2022 0933   BILITOT 0.2 09/16/2022 0933      Component Value Date/Time   TSH 2.380 08/18/2022 1310   Nutritional Lab Results  Component Value Date   VD25OH 51.2 04/23/2022   VD25OH 39.4 05/27/2017   VD25OH 31 05/21/2016     No follow-ups on file.Marland Kitchen She was informed of the importance of frequent follow up visits to maximize her success with intensive lifestyle modifications for her multiple health conditions.   ATTESTASTION  STATEMENTS:  Reviewed by clinician on day of visit: allergies, medications, problem list, medical history, surgical history, family history, social history, and previous encounter notes.     Worthy Rancher, MD

## 2023-01-13 LAB — LIPID PANEL WITH LDL/HDL RATIO
Cholesterol, Total: 162 mg/dL (ref 100–199)
HDL: 49 mg/dL (ref >39)
LDL Chol Calc (NIH): 89 mg/dL (ref 0–99)
LDL/HDL Ratio: 1.8 ratio (ref 0.0–3.2)
Triglycerides: 135 mg/dL (ref 0–149)
VLDL Cholesterol Cal: 24 mg/dL (ref 5–40)

## 2023-01-13 LAB — HEMOGLOBIN A1C
Est. average glucose Bld gHb Est-mCnc: 128 mg/dL
Hgb A1c MFr Bld: 6.1 % — ABNORMAL HIGH (ref 4.8–5.6)

## 2023-01-13 LAB — GLUCOSE, FASTING: Glucose, Plasma: 108 mg/dL — ABNORMAL HIGH (ref 70–99)

## 2023-01-13 NOTE — Therapy (Unsigned)
OUTPATIENT PHYSICAL THERAPY FEMALE PELVIC TREATMENT   Patient Name: Carol [name] MRN: [id number] DOB:June 10, 1951, 72 y.o., female Today's Date: 01/13/2023  END OF SESSION:          Past Medical History:  Diagnosis Date   Abnormal Pap smear of cervix    in 20's   Anxiety    claustrophobic   Arthritis    "neck, hands" (09/02/2013)   Asthma    Basal cell carcinoma of forehead 2008; 2014   Dizziness last occurance 10-15-2014   on occasion, not related to activity   DVT (deep venous thrombosis) (HCC) 06/28/2014   Dr Susann Givens, right leg, behind knee   Environmental allergies    Exercise-induced asthma 30's   Gallbladder problem    GERD (gastroesophageal reflux disease)    H/O hiatal hernia    Herpes simplex 1976   History of colonic polyps 05/21/2016   History of kidney stones    Hx of blood clots    Hyperlipidemia    IBS (irritable bowel syndrome)    Joint pain    Kidney problem    Migraine teens, returned in 40"s   "depends on the change in the weather; last one was 1 wk ago" (09/02/2013)   Obesity    Osteoarthritis    Plantar fasciitis    PONV (postoperative nausea and vomiting) 08/2013   ponv after gallblaffer   Prediabetes    Right ureteral stone    Seasonal allergic rhinitis    SOB (shortness of breath)    Tubular adenoma of colon 05/22/2017   per path report.    Tubular adenoma of colon 06/29/2020   Past Surgical History:  Procedure Laterality Date   ABDOMINAL HYSTERECTOMY  1992   CESAREAN SECTION  1981   CHOLECYSTECTOMY N/A 09/02/2013   Procedure: LAPAROSCOPIC CHOLECYSTECTOMY;  Surgeon: Shelly Rubenstein, MD;  Location: [facility];  Service: General;  Laterality: N/A;   CRYOTHERAPY     for abnormal pap smear in 20's   CYSTOSCOPY W/ URETERAL STENT PLACEMENT  03/05/2012   Procedure: CYSTOSCOPY WITH RETROGRADE PYELOGRAM/URETERAL STENT PLACEMENT;  Surgeon: Lindaann Slough, MD;  Location: [facility];  Service: Urology;  Laterality:  Right;   CYSTOSCOPY W/ URETERAL STENT PLACEMENT Right 01/19/2015   Procedure: CYSTOSCOPY WITH RETROGRADE PYELOGRAM/URETERAL  RIGHT STENT PLACEMENT;  Surgeon: Sebastian Ache, MD;  Location: [facility];  Service: Urology;  Laterality: Right;   CYSTOSCOPY WITH RETROGRADE PYELOGRAM, URETEROSCOPY AND STENT PLACEMENT Right 09/08/2014   Procedure: CYSTOSCOPY WITH RIGHT  RETROGRADE PYELOGRAM, URETEROSCOPY AND RIGHT STENT PLACEMENT;  Surgeon: Heloise Purpura, MD;  Location: [facility];  Service: Urology;  Laterality: Right;   CYSTOSCOPY WITH RETROGRADE PYELOGRAM, URETEROSCOPY AND STENT PLACEMENT Right 10/19/2014   Procedure: CYSTOSCOPY WITH Bilateral RETROGRADE PYELOGRAM/RIGHT URETEROSCOPY,RIGHT JJ STENT;  Surgeon: Jethro Bolus, MD;  Location: [facility];  Service: Urology;  Laterality: Right;   CYSTOSCOPY/RETROGRADE/URETEROSCOPY  11/18/2011   Procedure: CYSTOSCOPY/RETROGRADE/URETEROSCOPY;  Surgeon: Kathi Ludwig, MD;  Location: [facility];  Service: Urology;  Laterality: Right;   CYSTOSCOPY/RETROGRADE/URETEROSCOPY/STONE EXTRACTION WITH BASKET  10/21/2011   Procedure: CYSTOSCOPY/RETROGRADE/URETEROSCOPY/STONE EXTRACTION WITH BASKET;  Surgeon: Kathi Ludwig, MD;  Location: [facility];  Service: Urology;  Laterality: Right;   CYSTOSCOPY/RETROGRADE/URETEROSCOPY/STONE EXTRACTION WITH BASKET Right 09/10/2012   Procedure: RIGHT URETEROSCOPY/STONE EXTRACTION/LASER LITHOTRIPSY/POSSIBLE DOUBLE J STENT PLACEMENT;  Surgeon: Kathi Ludwig, MD;  Location: [facility];  Service: Urology;  Laterality: Right;   CYSTOSCOPY/URETEROSCOPY/HOLMIUM LASER/STENT PLACEMENT Left 05/02/2021   Procedure: CYSTOSCOPY/URETEROSCOPY/HOLMIUM LASER/STENT PLACEMENT;  Surgeon: Jerilee Field, MD;  Location: [facility];  Service: Urology;  Laterality: Left;   MOHS SURGERY  2008; 2014   PLANTAR FASCIA SURGERY  05/2016   POSTERIOR PINNACLE MESH SACROSPINOUS REPAIR  11-22-2010   RECURRENT ENTEROCELE   RIGHT URETEROSCOPIC STONE  EXTRACTION W/ STENT PLACEMENT  08-12-2011   x2 stents   ROBOTICALLY ASSISTED LAPAROSCOPIC URETERAL RE-IMPLANTATION Right 01/19/2015   Procedure: ROBOTICALLY ASSISTED LAPAROSCOPIC URETERAL RE-IMPLANTATION RIGHT;  Surgeon: Sebastian Ache, MD;  Location: [facility];  Service: Urology;  Laterality: Right;   TUBAL LIGATION  1991   VAULT SUSPENSION (DERMAL GRAFT)/ ENTEROCELE AND RECTOCELE REPAIR  12-05-2005   VAULT PROLAPSE/ ENTERCELE/ RECTOCELE   Patient Active Problem List   Diagnosis Date Noted   Brittle nails 10/22/2022   Cardiovascular risk factor 09/30/2022   Encounter for preventive care 09/30/2022   Prediabetes 09/16/2022   Depression screening 09/16/2022   SOB (shortness of breath) on exertion 09/16/2022   Other fatigue 09/16/2022   Recurrent kidney stones 09/01/2022   Tremor 05/07/2021   Class 1 obesity with serious comorbidity and body mass index (BMI) of 31.0 to 31.9 in adult 05/07/2021   Thrombocytopenia (HCC) 05/04/2021   History of renal stone 05/24/2020   History of DVT of lower extremity 10/23/2017   Nocturnal muscle cramps 10/23/2017   History of colonic polyps 05/21/2016   S/P laparoscopic cholecystectomy March 2015 09/03/2013   Hyperlipidemia with target LDL less than 100 06/27/2011   Herpes 12/16/2010   GERD (gastroesophageal reflux disease) 12/16/2010   HH (hiatus hernia) 12/16/2010   Hot flashes, menopausal 12/16/2010   Diverticulosis 12/16/2010   Asthma in adult, mild intermittent, uncomplicated 12/16/2010   BCE (basal cell epithelioma), 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  Rationale for Evaluation and Treatment: Rehabilitation  ONSET DATE: October 2023  SUBJECTIVE:                                                                                                                                                                                            SUBJECTIVE STATEMENT: Pt states she was sore after last time, just muscle soreness.  Pt states no pain only when pressing on it and it is much better.  Fluid intake: Yes: water    PAIN:  Are you having pain? No   PRECAUTIONS: None  WEIGHT BEARING RESTRICTIONS: No  FALLS:  Has patient fallen in last 6 months? No  LIVING ENVIRONMENT: Lives with: lives with their spouse Lives in: House/apartment   OCCUPATION: retired  PLOF: Independent  PATIENT GOALS: pain and frequency, urge, leakage  PERTINENT HISTORY:  Hysterectomy, bladder tacked 2x Sexual abuse: No  BOWEL MOVEMENT: Pain with bowel movement: No Type of bowel movement:Type (Bristol Stool Scale) soft, Frequency daily, and Strain No Fully empty rectum: Yes:   Leakage: No Pads: No Fiber supplement:   URINATION: Pain with urination: No Fully empty bladder: No Stream: Weak Urgency: Yes:   Frequency: going every 2/hour, nocturia 2-3x Leakage: Urge to void, Walking to the bathroom, Coughing, Sneezing, and Exercise Pads: Yes: 1 am and 1 pm  INTERCOURSE: Pain with intercourse:  No   PREGNANCY: Vaginal deliveries 2 Tearing No C-section deliveries 1   PROLAPSE: None   OBJECTIVE:   DIAGNOSTIC FINDINGS:    PATIENT SURVEYS:    PFIQ-7   COGNITION: Overall cognitive status: Within functional limits for tasks assessed     SENSATION: Light touch: Appears intact Proprioception:   MUSCLE LENGTH: Hamstrings: Right 80 deg; Left 80 deg Thomas test:   LUMBAR SPECIAL TESTS:  Straight leg raise test: Negative  FUNCTIONAL TESTS:    GAIT:  Comments: WFL   POSTURE: rounded shoulders, anterior pelvic tilt, and flexed trunk   PELVIC ALIGNMENT: normal  LUMBARAROM/PROM: WFL   A/PROM A/PROM  eval  Flexion 75%  Extension   Right lateral flexion 75%  Left lateral flexion 75%  Right rotation   Left rotation    (Blank rows = not tested)  LOWER EXTREMITY  ROM: WFL   Passive ROM Right eval Left eval  Hip flexion    Hip extension    Hip abduction    Hip adduction    Hip internal rotation    Hip external rotation    Knee flexion    Knee extension    Ankle dorsiflexion    Ankle plantarflexion    Ankle inversion    Ankle eversion     (Blank rows = not tested)  LOWER EXTREMITY MMT:  MMT Right eval Left eval Left (pain free) 12/03/22   Hip flexion  4/5 5/5  Hip extension   5  Hip abduction 4/5 4/5 4/5  Hip adduction 4/5 4/5 4/5  Hip internal rotation   5  Hip external rotation 4/5  5  Knee flexion   5  Knee extension   5  Ankle dorsiflexion     Ankle plantarflexion     Ankle inversion     Ankle eversion      PALPATION:   General  left lumbar paraspinals more tight than right, severe TTP ASIS and inguinal region                External Perineal Exam normal                             Internal Pelvic Floor tight levators with mild sensitivity slightly more on Left, contracted with inhale on deep inspiration  Patient confirms identification and approves PT to assess internal pelvic floor and treatment Yes  PELVIC MMT:   MMT eval  Vaginal 2/5 x 5 with extra time, 1 sec hold  Internal Anal Sphincter   External Anal Sphincter   Puborectalis   Diastasis Recti   (Blank rows = not tested)        TONE: High Lt>Rt  PROLAPSE: no  TODAY'S TREATMENT:  DATE:   01/14/23 Exercise:  Punch in standing green band - 15 x each Hip abduction and march blue loop - march One leg 90-90 in supine and opposite ball presses Step up 6 inch and slow lower - 20x bil  Step forward retroactive transversus abdominus activation - staying in that position with transversus abdominus activated then 20x quick flicks with exhales  Dead lift 15 lb - 20x Goblet Squat with 10 lb weight Hip machine 25lb - ext, abdction,  flexion - 15 reps bil  Palpation to glute med and TFL and hip flexors - all tender with light touch - application of ionto patch 1mL 4-6 hour release  01/01/23 Exercise:  Bridge with ball -  20x Kegel with hip IR - 20x Side lying hip abduction 15x bil Step up 6 inch and slow lower - 20x bil Step forward retroactive transversus abdominus activation - staying in that position with transversus abdominus activated then 20x quick flicks with exhales Dead lift 10 lb - 20x Goblet Squat with 10 lb weight Hip machine 25lb - ext, abdction, flexion - 15 reps bil  Palpation to glute med and TFL and hip flexors - all tender with light touch  12/10/22 Exercise:  Bridge with blue band - 10x Lying on peanut on mat - quadruped - UE reach, LE reach kickout and donkey kick - 10x each side  Step forward retroactive transversus abdominus activation Squat at bar UE use for stability and training correct movement pattern Squat with 10 lb kettle bell Hip machine 25lb - ext, abdction, flexion - 15 reps Manual: Palpation and gentle soft tissue and fascial release to glute med and lumbar paraspinals     PATIENT EDUCATION:  Education details: Access Code: [email or web address] Person educated: Patient Education method: Explanation, Demonstration, Actor cues, Verbal cues, and Handouts Education comprehension: verbalized understanding and returned demonstration  HOME EXERCISE PROGRAM: Access Code: [email or web address] URL: [email or web address] Date: 10/13/2022 Prepared by: Dwana Curd  Exercises - Supine Pelvic Floor Stretch  - 1 x daily - 7 x weekly - 1 sets - 3 reps - 30 sec hold - Supine Hip Internal and External Rotation  - 1 x daily - 7 x weekly - 1 sets - 10 reps - 5 sec hold - Supine Piriformis Stretch  - 1 x daily - 7 x weekly - 1 sets - 3 reps - 30 sec hold - Supine Transversus Abdominis Bracing with Double Leg Fallout  - 1 x daily - 7 x weekly - 2 sets - 10 reps - Dead Bug  - 1 x daily - 7  x weekly - 3 sets - 10 reps - Quadruped Circle Weight Shifts  - 1 x daily - 7 x weekly - 3 sets - 10 reps - Quadruped Thoracic Spine Extension  - 1 x daily - 7 x weekly - 3 sets - 10 reps  ASSESSMENT:  CLINICAL IMPRESSION: Today's session focused on core and pelvic floor strength.  Re-assessed soft tissue including palpation with tenderness to TFL, hip flexor and glute med all moderate to high level of tenderness. Continued to progress core and pelvic floor strength and added ionto for pain management. Pt will benefit from skilled PT to progress strength and added ionto to plan for pain management to reduce inflammation around the left muscle attachments.   OBJECTIVE IMPAIRMENTS: decreased coordination, decreased endurance, decreased ROM, decreased strength, increased fascial restrictions, increased muscle spasms, impaired flexibility, impaired tone, postural dysfunction, and pain.   ACTIVITY LIMITATIONS: lifting, sitting, continence, and  toileting  PARTICIPATION LIMITATIONS: community activity  PERSONAL FACTORS: Time since onset of injury/illness/exacerbation and 3+ comorbidities: fell <1 year ago with broken ankle Rt; vaginal deliveries, c-section, bladder surgery, hysterectomy  are also affecting patient's functional outcome.   REHAB POTENTIAL: Excellent  CLINICAL DECISION MAKING: Evolving/moderate complexity  EVALUATION COMPLEXITY: Moderate   GOALS: Goals reviewed with patient? Yes  SHORT TERM GOALS: Target date: 10/29/22 Updated 11/26/22  Ind with correct muscle movement of core with breathing Baseline: Goal status: MET  2.  Urge techniques known Baseline:  Goal status: MET  3.  Ind with initial core strengthening Baseline:  Goal status: MET  4.  Pain reduced by 25% Baseline: 40% better pain wasn't bothering as much, mowed last Saturday and dumping yard clippings and that irritated it again Goal status: MET   LONG TERM GOALS: Target date: 01/29/23 Update 01/01/23  Pt  will be independent with advanced HEP to maintain improvements made throughout therapy  Baseline:  Goal status: IN PROGRESS  2.  Pt will report 75% reduction of pain due to improvements in posture, strength, and muscle length  Baseline: 85% better  Goal status: IN PROGRESS  3.  Pt will have to use 0-1 pads per day  Baseline: still using pads and leak when getting Goal status: IN PROGRESS  4.  Pt will be able to functional actions such as walking to the bathroom without leakage  Baseline: sometimes Goal status: IN PROGRESS  5.  Pt will have 50% less urgency due to bladder retraining and strengthening  Baseline: still has sometimes Goal status: IN PROGRESS  6.  Pt will have nocturia decreased to 1/night at most  Baseline: currently 1-2 Goal status: IN PROGRESS  PLAN:  PT FREQUENCY: 1x/week  PT DURATION: 12 weeks  PLANNED INTERVENTIONS: Therapeutic exercises, Therapeutic activity, Neuromuscular re-education, Balance training, Gait training, Patient/Family education, Self Care, Joint mobilization, Dry Needling, Electrical stimulation, Cryotherapy, Moist heat, Taping, Biofeedback, Ionotophoresis 4mg /ml Dexamethasone, Manual therapy, and Re-evaluation  PLAN FOR NEXT SESSION: continue pelvic floor strength and ionto #2 if did okay that that, final HEP   Russella Dar, PT, DPT 01/13/23 5:51 PM

## 2023-01-14 ENCOUNTER — Ambulatory Visit: Payer: PPO | Admitting: Physical Therapy

## 2023-01-14 DIAGNOSIS — M6281 Muscle weakness (generalized): Secondary | ICD-10-CM | POA: Diagnosis not present

## 2023-01-14 DIAGNOSIS — R279 Unspecified lack of coordination: Secondary | ICD-10-CM

## 2023-01-14 DIAGNOSIS — M62838 Other muscle spasm: Secondary | ICD-10-CM

## 2023-01-19 NOTE — Therapy (Addendum)
OUTPATIENT PHYSICAL THERAPY FEMALE PELVIC TREATMENT Progress Note Reporting Period 10/01/22 to 01/20/23   See note below for Objective Data and Assessment of Progress/Goals.      Patient Name: Carol [name] MRN: [id number] DOB:1950/10/19, 72 y.o., female Today's Date: 01/20/2023  END OF SESSION:  PT End of Session - 01/20/23 0935     Visit Number 10    Date for PT Re-Evaluation 01/29/23    Authorization Type [employer]    PT Start Time 0932    PT Stop Time 1010    PT Time Calculation (min) 38 min    Activity Tolerance Patient tolerated treatment well    Behavior During Therapy WFL for tasks assessed/performed                    Past Medical History:  Diagnosis Date   Abnormal Pap smear of cervix    in 20's   Anxiety    claustrophobic   Arthritis    "neck, hands" (09/02/2013)   Asthma    Basal cell carcinoma of forehead 2008; 2014   Dizziness last occurance 10-15-2014   on occasion, not related to activity   DVT (deep venous thrombosis) (HCC) 06/28/2014   Dr Susann Givens, right leg, behind knee   Environmental allergies    Exercise-induced asthma 30's   Gallbladder problem    GERD (gastroesophageal reflux disease)    H/O hiatal hernia    Herpes simplex 1976   History of colonic polyps 05/21/2016   History of kidney stones    Hx of blood clots    Hyperlipidemia    IBS (irritable bowel syndrome)    Joint pain    Kidney problem    Migraine teens, returned in 40"s   "depends on the change in the weather; last one was 1 wk ago" (09/02/2013)   Obesity    Osteoarthritis    Plantar fasciitis    PONV (postoperative nausea and vomiting) 08/2013   ponv after gallblaffer   Prediabetes    Right ureteral stone    Seasonal allergic rhinitis    SOB (shortness of breath)    Tubular adenoma of colon 05/22/2017   per path report.    Tubular adenoma of colon 06/29/2020   Past Surgical History:  Procedure Laterality Date   ABDOMINAL HYSTERECTOMY  1992    CESAREAN SECTION  1981   CHOLECYSTECTOMY N/A 09/02/2013   Procedure: LAPAROSCOPIC CHOLECYSTECTOMY;  Surgeon: Shelly Rubenstein, MD;  Location: [facility];  Service: General;  Laterality: N/A;   CRYOTHERAPY     for abnormal pap smear in 20's   CYSTOSCOPY W/ URETERAL STENT PLACEMENT  03/05/2012   Procedure: CYSTOSCOPY WITH RETROGRADE PYELOGRAM/URETERAL STENT PLACEMENT;  Surgeon: Lindaann Slough, MD;  Location: [facility];  Service: Urology;  Laterality: Right;   CYSTOSCOPY W/ URETERAL STENT PLACEMENT Right 01/19/2015   Procedure: CYSTOSCOPY WITH RETROGRADE PYELOGRAM/URETERAL  RIGHT STENT PLACEMENT;  Surgeon: Sebastian Ache, MD;  Location: [facility];  Service: Urology;  Laterality: Right;   CYSTOSCOPY WITH RETROGRADE PYELOGRAM, URETEROSCOPY AND STENT PLACEMENT Right 09/08/2014   Procedure: CYSTOSCOPY WITH RIGHT  RETROGRADE PYELOGRAM, URETEROSCOPY AND RIGHT STENT PLACEMENT;  Surgeon: Heloise Purpura, MD;  Location: [facility];  Service: Urology;  Laterality: Right;   CYSTOSCOPY WITH RETROGRADE PYELOGRAM, URETEROSCOPY AND STENT PLACEMENT Right 10/19/2014   Procedure: CYSTOSCOPY WITH Bilateral RETROGRADE PYELOGRAM/RIGHT URETEROSCOPY,RIGHT JJ STENT;  Surgeon: Jethro Bolus, MD;  Location: [facility];  Service: Urology;  Laterality: Right;   CYSTOSCOPY/RETROGRADE/URETEROSCOPY  11/18/2011  Procedure: CYSTOSCOPY/RETROGRADE/URETEROSCOPY;  Surgeon: Kathi Ludwig, MD;  Location: [facility];  Service: Urology;  Laterality: Right;   CYSTOSCOPY/RETROGRADE/URETEROSCOPY/STONE EXTRACTION WITH BASKET  10/21/2011   Procedure: CYSTOSCOPY/RETROGRADE/URETEROSCOPY/STONE EXTRACTION WITH BASKET;  Surgeon: Kathi Ludwig, MD;  Location: [facility];  Service: Urology;  Laterality: Right;   CYSTOSCOPY/RETROGRADE/URETEROSCOPY/STONE EXTRACTION WITH BASKET Right 09/10/2012   Procedure: RIGHT URETEROSCOPY/STONE EXTRACTION/LASER LITHOTRIPSY/POSSIBLE DOUBLE J STENT PLACEMENT;  Surgeon: Kathi Ludwig,  MD;  Location: [facility];  Service: Urology;  Laterality: Right;   CYSTOSCOPY/URETEROSCOPY/HOLMIUM LASER/STENT PLACEMENT Left 05/02/2021   Procedure: CYSTOSCOPY/URETEROSCOPY/HOLMIUM LASER/STENT PLACEMENT;  Surgeon: Jerilee Field, MD;  Location: [facility];  Service: Urology;  Laterality: Left;   MOHS SURGERY  2008; 2014   PLANTAR FASCIA SURGERY  05/2016   POSTERIOR PINNACLE MESH SACROSPINOUS REPAIR  11-22-2010   RECURRENT ENTEROCELE   RIGHT URETEROSCOPIC STONE EXTRACTION W/ STENT PLACEMENT  08-12-2011   x2 stents   ROBOTICALLY ASSISTED LAPAROSCOPIC URETERAL RE-IMPLANTATION Right 01/19/2015   Procedure: ROBOTICALLY ASSISTED LAPAROSCOPIC URETERAL RE-IMPLANTATION RIGHT;  Surgeon: Sebastian Ache, MD;  Location: [facility];  Service: Urology;  Laterality: Right;   TUBAL LIGATION  1991   VAULT SUSPENSION (DERMAL GRAFT)/ ENTEROCELE AND RECTOCELE REPAIR  12-05-2005   VAULT PROLAPSE/ ENTERCELE/ RECTOCELE   Patient Active Problem List   Diagnosis Date Noted   Brittle nails 10/22/2022   Cardiovascular risk factor 09/30/2022   Encounter for preventive care 09/30/2022   Prediabetes 09/16/2022   Depression screening 09/16/2022   SOB (shortness of breath) on exertion 09/16/2022   Other fatigue 09/16/2022   Recurrent kidney stones 09/01/2022   Tremor 05/07/2021   Class 1 obesity with serious comorbidity and body mass index (BMI) of 31.0 to 31.9 in adult 05/07/2021   Thrombocytopenia (HCC) 05/04/2021   History of renal stone 05/24/2020   History of DVT of lower extremity 10/23/2017   Nocturnal muscle cramps 10/23/2017   History of colonic polyps 05/21/2016   S/P laparoscopic cholecystectomy March 2015 09/03/2013   Hyperlipidemia with target LDL less than 100 06/27/2011   Herpes 12/16/2010   GERD (gastroesophageal reflux disease) 12/16/2010   HH (hiatus hernia) 12/16/2010   Hot flashes, menopausal 12/16/2010   Diverticulosis 12/16/2010   Asthma in adult, mild intermittent, uncomplicated 12/16/2010    BCE (basal cell epithelioma), 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  Pt states no pain only when pressing on it and it is much better.  Fluid intake: Yes: water    PAIN:  Are you having pain? No   PRECAUTIONS: None  WEIGHT BEARING RESTRICTIONS: No  FALLS:  Has patient fallen in last 6 months? No  LIVING ENVIRONMENT: Lives with: lives with their spouse Lives in: House/apartment   OCCUPATION: retired  PLOF: Independent  PATIENT GOALS: pain and frequency, urge, leakage  PERTINENT HISTORY:  Hysterectomy, bladder tacked 2x Sexual abuse: No  BOWEL MOVEMENT: Pain with bowel movement: No Type of bowel movement:Type (Bristol Stool Scale) soft, Frequency daily, and Strain No Fully empty rectum: Yes:   Leakage: No Pads: No Fiber supplement:   URINATION: Pain with urination: No Fully empty bladder: No Stream: Weak Urgency: Yes:   Frequency: going every 2/hour, nocturia 2-3x Leakage: Urge to void, Walking to the bathroom, Coughing, Sneezing, and Exercise Pads: Yes: 1 am and 1 pm  INTERCOURSE: Pain with intercourse:  No   PREGNANCY: Vaginal deliveries 2 Tearing No C-section deliveries 1   PROLAPSE: None   OBJECTIVE:   DIAGNOSTIC FINDINGS:    PATIENT SURVEYS:    PFIQ-7   COGNITION: Overall cognitive status: Within functional limits for tasks  assessed     SENSATION: Light touch: Appears intact Proprioception:   MUSCLE LENGTH: Hamstrings: Right 80 deg; Left 80 deg Thomas test:   LUMBAR SPECIAL TESTS:  Straight leg raise test: Negative  FUNCTIONAL TESTS:    GAIT:  Comments: WFL   POSTURE: rounded shoulders, anterior pelvic tilt, and flexed trunk   PELVIC ALIGNMENT: normal  LUMBARAROM/PROM: WFL   A/PROM A/PROM  eval  Flexion 75%  Extension   Right lateral flexion 75%  Left lateral flexion 75%  Right rotation   Left rotation    (Blank rows = not tested)  LOWER EXTREMITY ROM: WFL   Passive ROM Right eval Left eval  Hip flexion    Hip extension    Hip abduction    Hip adduction    Hip internal rotation    Hip external rotation    Knee flexion    Knee extension    Ankle dorsiflexion    Ankle plantarflexion    Ankle inversion    Ankle eversion     (Blank rows = not tested)  LOWER EXTREMITY MMT:  MMT Right eval Left eval Left (pain free) 12/03/22   Hip flexion  4/5 5/5  Hip extension   5  Hip abduction 4/5 4/5 4/5  Hip adduction 4/5 4/5 4/5  Hip internal rotation   5  Hip external rotation 4/5  5  Knee flexion   5  Knee extension   5  Ankle dorsiflexion     Ankle plantarflexion     Ankle inversion     Ankle eversion      PALPATION:   General  left lumbar paraspinals more tight than right, severe TTP ASIS and inguinal region                External Perineal Exam normal                             Internal Pelvic Floor tight levators with mild sensitivity slightly more on Left, contracted with inhale on deep inspiration  Patient confirms identification and approves PT to assess internal pelvic floor and treatment Yes  PELVIC  MMT:   MMT eval  Vaginal 2/5 x 5 with extra time, 1 sec hold  Internal Anal Sphincter   External Anal Sphincter   Puborectalis   Diastasis Recti   (Blank rows = not tested)        TONE: High Lt>Rt  PROLAPSE: no  TODAY'S TREATMENT:                                                                                                                               DATE: 01/20/23 Exercise:  Punch in and extension standing 5 lb ith pulley machine - 15 x each Single arm tricep ext - green band - 10x bil Step up 6 inch and slow lower - 15x bil Shoulder flexion with ha-ha-ha for quick flicks and core activation - 20  Dead lift 15 lb - 20x Hip abduction and march blue loop - 20x One leg 90-90 in supine and opposite ball presses Hip machine 30lb - ext, abdction, flexion - 15 reps bil Internal hip rotation - 2 lb ankle weight - 20x  Palpation to glute med and TFL and hip flexors - all tender with light touch - application of ionto patch 1mL 4-6 hour release  01/14/23 Exercise:  Punch in standing green band - 15 x each Hip abduction and march blue loop - march One leg 90-90 in supine and opposite ball presses Step up 6 inch and slow lower - 20x bil  Step forward retroactive transversus abdominus activation - staying in that position with transversus abdominus activated then 20x quick flicks with exhales  Dead lift 15 lb - 20x Goblet Squat with 10 lb weight Hip machine 25lb - ext, abdction, flexion - 15 reps bil  Palpation to glute med and TFL and hip flexors - all tender with light touch - application of ionto patch 1mL 4-6 hour release  01/01/23 Exercise:  Bridge with ball -  20x Kegel with hip IR - 20x Side lying hip abduction 15x bil Step up 6 inch and slow lower - 20x bil Step forward retroactive transversus abdominus activation - staying in that position with transversus abdominus activated then 20x quick flicks with exhales Dead lift 10 lb - 20x Goblet Squat with 10 lb weight Hip machine 25lb - ext, abdction, flexion - 15 reps bil  Palpation to glute med and TFL and hip flexors - all tender with light touch  12/10/22 Exercise:  Bridge with blue band - 10x Lying on peanut on mat - quadruped - UE reach, LE reach kickout and  donkey kick - 10x each side  Step forward retroactive transversus abdominus activation Squat at bar UE use for stability and training correct movement pattern Squat with 10 lb kettle bell Hip machine 25lb - ext, abdction, flexion - 15 reps Manual: Palpation and gentle soft tissue and fascial release to glute med and lumbar paraspinals     PATIENT EDUCATION:  Education details: Access Code: [email or web address] Person educated: Patient  Education method: Explanation, Demonstration, Tactile cues, Verbal cues, and Handouts Education comprehension: verbalized understanding and returned demonstration  HOME EXERCISE PROGRAM: Access Code: [email or web address] URL: [email or web address] Date: 10/13/2022 Prepared by: Dwana Curd  Exercises - Supine Pelvic Floor Stretch  - 1 x daily - 7 x weekly - 1 sets - 3 reps - 30 sec hold - Supine Hip Internal and External Rotation  - 1 x daily - 7 x weekly - 1 sets - 10 reps - 5 sec hold - Supine Piriformis Stretch  - 1 x daily - 7 x weekly - 1 sets - 3 reps - 30 sec hold - Supine Transversus Abdominis Bracing with Double Leg Fallout  - 1 x daily - 7 x weekly - 2 sets - 10 reps - Dead Bug  - 1 x daily - 7 x weekly - 3 sets - 10 reps - Quadruped Circle Weight Shifts  - 1 x daily - 7 x weekly - 3 sets - 10 reps - Quadruped Thoracic Spine Extension  - 1 x daily - 7 x weekly - 3 sets - 10 reps  ASSESSMENT:  CLINICAL IMPRESSION: Today's session continued to work on strength and ensured pt is ind with final HEP.  Pt responded well to ionto last time and did this again for continued reduction of inflammation.  Pt still having small amount of leakage but has met all goals for pain.  Pt is ind with final HEP and will d/c with HEP today.  OBJECTIVE IMPAIRMENTS: decreased coordination, decreased endurance, decreased ROM, decreased strength, increased fascial restrictions, increased muscle spasms, impaired flexibility, impaired tone, postural dysfunction, and pain.    ACTIVITY LIMITATIONS: lifting, sitting, continence, and toileting  PARTICIPATION LIMITATIONS: community activity  PERSONAL FACTORS: Time since onset of injury/illness/exacerbation and 3+ comorbidities: fell <1 year ago with broken ankle Rt; vaginal deliveries, c-section, bladder surgery, hysterectomy  are also affecting patient's functional outcome.   REHAB POTENTIAL: Excellent  CLINICAL DECISION MAKING: Evolving/moderate complexity  EVALUATION COMPLEXITY: Moderate   GOALS: Goals reviewed with patient? Yes  SHORT TERM GOALS: Target date: 10/29/22 Updated 11/26/22  Ind with correct muscle movement of core with breathing Baseline: Goal status: MET  2.  Urge techniques known Baseline:  Goal status: MET  3.  Ind with initial core strengthening Baseline:  Goal status: MET  4.  Pain reduced by 25% Baseline: 40% better pain wasn't bothering as much, mowed last Saturday and dumping yard clippings and that irritated it again Goal status: MET   LONG TERM GOALS: Target date: 01/29/23 Update 01/01/23  Pt will be independent with advanced HEP to maintain improvements made throughout therapy  Baseline:  Goal status: IN PROGRESS  2.  Pt will report 75% reduction of pain due to improvements in posture, strength, and muscle length  Baseline: 85% better  Goal status: MET  3.  Pt will have to use 0-1 pads per day  Baseline: using the same amount Goal status: NOT MET  4.  Pt will be able to functional actions such as walking to the bathroom without leakage  Baseline: still when coughing hard or sneeze hard,  Goal status: NOT MET  5.  Pt will have 50% less urgency due to bladder retraining and strengthening  Baseline: 80% Goal status: MET  6.  Pt will have nocturia decreased to 1/night at most  Baseline: currently 1-2 Goal status: MET  PLAN:  PT FREQUENCY: 1x/week  PT DURATION: 12 weeks  PLANNED INTERVENTIONS: Therapeutic exercises, Therapeutic activity, Neuromuscular  re-education, Balance  training, Gait training, Patient/Family education, Self Care, Joint mobilization, Dry Needling, Electrical stimulation, Cryotherapy, Moist heat, Taping, Biofeedback, Ionotophoresis 4mg /ml Dexamethasone, Manual therapy, and Re-evaluation  PLAN FOR NEXT SESSION: d/c today   Russella Dar, PT, DPT 01/20/23 11:03 AM  PHYSICAL THERAPY DISCHARGE SUMMARY  Visits from Start of Care: 10  Current functional level related to goals / functional outcomes: See above goals met   Remaining deficits: See above    Education / Equipment: HEP   Patient agrees to discharge. Patient goals were met. Patient is being discharged due to being pleased with the current functional level. Russella Dar, PT, DPT 01/20/23 11:12 AM

## 2023-01-20 ENCOUNTER — Ambulatory Visit: Payer: PPO | Admitting: Physical Therapy

## 2023-01-20 DIAGNOSIS — M6281 Muscle weakness (generalized): Secondary | ICD-10-CM | POA: Diagnosis not present

## 2023-01-20 DIAGNOSIS — R279 Unspecified lack of coordination: Secondary | ICD-10-CM

## 2023-01-20 DIAGNOSIS — M62838 Other muscle spasm: Secondary | ICD-10-CM

## 2023-02-05 ENCOUNTER — Ambulatory Visit (INDEPENDENT_AMBULATORY_CARE_PROVIDER_SITE_OTHER): Payer: PPO | Admitting: Internal Medicine

## 2023-02-05 ENCOUNTER — Encounter (INDEPENDENT_AMBULATORY_CARE_PROVIDER_SITE_OTHER): Payer: Self-pay | Admitting: Internal Medicine

## 2023-02-05 VITALS — BP 112/75 | HR 81 | Temp 98.3°F | Ht 60.0 in | Wt 151.0 lb

## 2023-02-05 DIAGNOSIS — E785 Hyperlipidemia, unspecified: Secondary | ICD-10-CM

## 2023-02-05 DIAGNOSIS — R7303 Prediabetes: Secondary | ICD-10-CM

## 2023-02-05 DIAGNOSIS — Z6829 Body mass index (BMI) 29.0-29.9, adult: Secondary | ICD-10-CM | POA: Diagnosis not present

## 2023-02-05 DIAGNOSIS — E668 Other obesity: Secondary | ICD-10-CM | POA: Diagnosis not present

## 2023-02-05 DIAGNOSIS — E669 Obesity, unspecified: Secondary | ICD-10-CM

## 2023-02-05 NOTE — Assessment & Plan Note (Signed)
Her LDL cholesterol has improved down to 89.  She is currently on atorvastatin 20 mg a day without any adverse effects.  She will continue statin therapy

## 2023-02-05 NOTE — Assessment & Plan Note (Signed)
Most recent A1c is  Lab Results  Component Value Date   HGBA1C 6.1 (H) 01/12/2023   HGBA1C 5.6 02/22/2013   Which has increased.  She benefits from pharmacoprophylaxis with metformin or incretin therapy but has declined.  She will continue to work on nutrition and behavioral strategies.  She has also increased amount of physical activity.  We will check hemoglobin A1c in 6 months and consider pharmacoprophylaxis if still above 5.7.

## 2023-02-05 NOTE — Assessment & Plan Note (Signed)
She has lost 10% of total body weight via nutritional and behavioral strategies.  Her BIA shows a reduction in body fat percentage with relative preservation of muscle mass.  Her resting energy expenditure is approximately 1250.  Her weight loss will be gradual and certainly doable we just have to target a calorie range of about 1000 kcal/day and 90 g of protein.  She also continues to increase the volume of physical activity.  She has agreed on tracking and journaling using my net diary.  I provided her with a demonstration on how to use application.

## 2023-02-05 NOTE — Progress Notes (Signed)
Office: [phone]  /  Fax: [phone]  WEIGHT SUMMARY AND BIOMETRICS  Vitals Temp: 98.3 F (36.8 C) BP: 112/75 Pulse Rate: 81 SpO2: 96 %   Anthropometric Measurements Height: 5' (1.524 m) Weight: 151 lb (68.5 kg) BMI (Calculated): 29.49 Weight at Last Visit: 151 lb Weight Lost Since Last Visit: 1 lb Weight Gained Since Last Visit: 0 lb Starting Weight: 159 lb Total Weight Loss (lbs): 9 lb (4.082 kg) Peak Weight: 172 lb   Body Composition  Body Fat %: 39.3 % Fat Mass (lbs): 59.2 lbs Muscle Mass (lbs): 86.8 lbs Total Body Water (lbs): 59.4 lbs Visceral Fat Rating : 11    No data recorded Today's Visit #: 7  Starting Date: 08/27/22   HPI  Chief Complaint: OBESITY  Carol [name] is here to discuss her progress with her obesity treatment plan. She is on the the Category 1 Plan and states she is following her eating plan approximately 85 % of the time. She states she is exercising 90-120 minutes 2 times per week.  Interval History:  Since last office visit she has lost 1 pound.  She has been increasing aerobic activity with her friend and has been challenging herself. She reports good adherence to reduced calorie nutritional plan. She has been working on reading food labels, not skipping meals, eating more fruits, eating more vegetables, drinking more water, avoiding and / or reducing liquid calories, making healthier choices, and continues to exercise  Orixegenic Control: Denies problems with appetite and hunger signals.  Denies problems with satiety and satiation.  Denies problems with eating patterns and portion control.  Denies abnormal cravings. Denies feeling deprived or restricted.   Barriers identified: slow metabolism for age.   Pharmacotherapy for weight loss: She is currently taking no anti-obesity medication.    ASSESSMENT AND PLAN  TREATMENT PLAN FOR OBESITY:  Recommended Dietary Goals  Ahsley is currently in the action stage of change. As  such, her goal is to continue weight management plan. She has agreed to: keep a food journal with a target of  1000 cal calories and 30-40 grams of protein per meal and continue current plan    Behavioral Intervention  We discussed the following Behavioral Modification Strategies today: increasing lean protein intake, continue to practice mindfulness when eating, and planning for success.  Additional resources provided today: Handout and personalized instruction on tracking and journaling using Apps  Recommended Physical Activity Goals  Meko has been advised to work up to 150 minutes of moderate intensity aerobic activity a week and strengthening exercises 2-3 times per week for cardiovascular health, weight loss maintenance and preservation of muscle mass.   She has agreed to :  Continue current level of physical activity   Pharmacotherapy We discussed various medication options to help Sundari with her weight loss efforts and we both agreed to : continue with nutritional and behavioral strategies  ASSOCIATED CONDITIONS ADDRESSED TODAY  Prediabetes Assessment & Plan: Most recent A1c is  Lab Results  Component Value Date   HGBA1C 6.1 (H) 01/12/2023   HGBA1C 5.6 02/22/2013   Which has increased.  She benefits from pharmacoprophylaxis with metformin or incretin therapy but has declined.  She will continue to work on nutrition and behavioral strategies.  She has also increased amount of physical activity.  We will check hemoglobin A1c in 6 months and consider pharmacoprophylaxis if still above 5.7.   Class 1 obesity with serious comorbidity and body mass index (BMI) of 31.0 to 31.9 in adult, unspecified  obesity type Assessment & Plan: She has lost 10% of total body weight via nutritional and behavioral strategies.  Her BIA shows a reduction in body fat percentage with relative preservation of muscle mass.  Her resting energy expenditure is approximately 1250.  Her weight loss will be  gradual and certainly doable we just have to target a calorie range of about 1000 kcal/day and 90 g of protein.  She also continues to increase the volume of physical activity.  She has agreed on tracking and journaling using my net diary.  I provided her with a demonstration on how to use application.   Hyperlipidemia with target LDL less than 100 Assessment & Plan: Her LDL cholesterol has improved down to 89.  She is currently on atorvastatin 20 mg a day without any adverse effects.  She will continue statin therapy     PHYSICAL EXAM:  Blood pressure 112/75, pulse 81, temperature 98.3 F (36.8 C), height 5' (1.524 m), weight 151 lb (68.5 kg), last menstrual period 06/23/1990, SpO2 96%. Body mass index is 29.49 kg/m.  General: She is overweight, cooperative, alert, well developed, and in no acute distress. PSYCH: Has normal mood, affect and thought process.   HEENT: EOMI, sclerae are anicteric. Lungs: Normal breathing effort, no conversational dyspnea. Extremities: No edema.  Neurologic: No gross sensory or motor deficits. No tremors or fasciculations noted.    DIAGNOSTIC DATA REVIEWED:  BMET    Component Value Date/Time   NA 142 09/16/2022 0933   K 4.5 09/16/2022 0933   CL 105 09/16/2022 0933   CO2 22 09/16/2022 0933   GLUCOSE 107 (H) 09/16/2022 0933   GLUCOSE 143 (H) 05/07/2021 0442   BUN 22 09/16/2022 0933   CREATININE 0.95 09/16/2022 0933   CREATININE 0.76 03/16/2017 1354   CALCIUM 9.5 09/16/2022 0933   GFRNONAA >60 05/07/2021 0442   GFRAA 79 10/21/2019 1326   Lab Results  Component Value Date   HGBA1C 6.1 (H) 01/12/2023   HGBA1C 5.6 02/22/2013   Lab Results  Component Value Date   INSULIN 11.1 09/16/2022   Lab Results  Component Value Date   TSH 2.380 08/18/2022   CBC    Component Value Date/Time   WBC 7.4 04/23/2022 1331   WBC 14.2 (H) 05/07/2021 0446   RBC 4.76 04/23/2022 1331   RBC 3.78 (L) 05/07/2021 0446   HGB 14.2 04/23/2022 1331   HGB 14.3  05/21/2016 1128   HCT 42.1 04/23/2022 1331   PLT 307 04/23/2022 1331   MCV 88 04/23/2022 1331   MCH 29.8 04/23/2022 1331   MCH 29.1 05/07/2021 0446   MCHC 33.7 04/23/2022 1331   MCHC 33.3 05/07/2021 0446   RDW 12.8 04/23/2022 1331   Iron Studies    Component Value Date/Time   IRON 47 10/22/2022 1430   TIBC 323 10/22/2022 1430   FERRITIN 166 (H) 10/22/2022 1430   IRONPCTSAT 15 10/22/2022 1430   Lipid Panel     Component Value Date/Time   CHOL 162 01/12/2023 1213   TRIG 135 01/12/2023 1213   HDL 49 01/12/2023 1213   CHOLHDL 3.6 04/23/2022 1331   CHOLHDL 3.2 05/21/2016 1407   VLDL 44 (H) 05/21/2016 1407   LDLCALC 89 01/12/2023 1213   Hepatic Function Panel     Component Value Date/Time   PROT 7.4 09/16/2022 0933   ALBUMIN 4.8 09/16/2022 0933   AST 24 09/16/2022 0933   ALT 25 09/16/2022 0933   ALKPHOS 103 09/16/2022 0933   BILITOT 0.2 09/16/2022  1610      Component Value Date/Time   TSH 2.380 08/18/2022 1310   Nutritional Lab Results  Component Value Date   VD25OH 51.2 04/23/2022   VD25OH 39.4 05/27/2017   VD25OH 31 05/21/2016     Return in about 4 weeks (around 03/05/2023) for For Weight Mangement with Dr. Rikki Spearing.Marland Kitchen She was informed of the importance of frequent follow up visits to maximize her success with intensive lifestyle modifications for her multiple health conditions.   ATTESTASTION STATEMENTS:  Reviewed by clinician on day of visit: allergies, medications, problem list, medical history, surgical history, family history, social history, and previous encounter notes.     Worthy Rancher, MD

## 2023-02-11 ENCOUNTER — Encounter: Payer: Self-pay | Admitting: Family Medicine

## 2023-02-11 ENCOUNTER — Ambulatory Visit: Payer: PPO | Admitting: Family Medicine

## 2023-02-11 VITALS — BP 110/80 | HR 71 | Temp 98.0°F | Resp 16 | Wt 154.6 lb

## 2023-02-11 DIAGNOSIS — R058 Other specified cough: Secondary | ICD-10-CM | POA: Diagnosis not present

## 2023-02-11 DIAGNOSIS — K2100 Gastro-esophageal reflux disease with esophagitis, without bleeding: Secondary | ICD-10-CM

## 2023-02-11 MED ORDER — AZITHROMYCIN 500 MG PO TABS: 500.0000 mg | ORAL_TABLET | Freq: Every day | ORAL | 0 refills | Status: DC

## 2023-02-11 NOTE — Patient Instructions (Signed)
Double the dose of the Protonix for the next couple weeks taking it twice per day and call me in about 2 weeks

## 2023-02-11 NOTE — Progress Notes (Signed)
   Subjective:    Patient ID: Carol [name], female    DOB: Sep 27, 1950, 72 y.o.   MRN: [id number]  HPI She complains of a 36-month history that started with a slight cough with rhinorrhea.  She notes the cough is much worse in the evening and she also has noted some burning chest sensations.  He did a COVID test which was negative.  She does have a history of asthma but is rarely using her albuterol inhaler.  She also continues on Protonix for her reflux symptoms.  She has had no fever, chills but did complain of slight left earache.  Recently the cough has become productive.   Review of Systems     Objective:    Physical Exam Alert and in no distress. Tympanic membranes and canals are normal. Pharyngeal area is normal. Neck is supple without adenopathy or thyromegaly. Cardiac exam shows a regular sinus rhythm without murmurs or gallops. Lungs are clear to auscultation.        Assessment & Plan:   Problem List Items Addressed This Visit     GERD (gastroesophageal reflux disease) - Primary   Other Visit Diagnoses     Productive cough       Relevant Medications   azithromycin (ZITHROMAX) 500 MG tablet     I explained that I thought that her symptoms sound more like reflux in spite of the fact that she has been on Protonix especially with his burning chest in the p.m. cough.  She is to double her Protonix and let me know in 2 weeks.  I will also put her on azithromycin as the productive cough is of concern

## 2023-03-05 ENCOUNTER — Ambulatory Visit (INDEPENDENT_AMBULATORY_CARE_PROVIDER_SITE_OTHER): Payer: PPO | Admitting: Internal Medicine

## 2023-03-05 ENCOUNTER — Encounter (INDEPENDENT_AMBULATORY_CARE_PROVIDER_SITE_OTHER): Payer: Self-pay | Admitting: Internal Medicine

## 2023-03-05 VITALS — BP 113/79 | HR 70 | Temp 97.6°F | Ht 60.0 in | Wt 150.0 lb

## 2023-03-05 DIAGNOSIS — E669 Obesity, unspecified: Secondary | ICD-10-CM

## 2023-03-05 DIAGNOSIS — R7303 Prediabetes: Secondary | ICD-10-CM

## 2023-03-05 DIAGNOSIS — E785 Hyperlipidemia, unspecified: Secondary | ICD-10-CM | POA: Diagnosis not present

## 2023-03-05 DIAGNOSIS — Z6831 Body mass index (BMI) 31.0-31.9, adult: Secondary | ICD-10-CM

## 2023-03-05 MED ORDER — METFORMIN HCL ER 500 MG PO TB24: 500.0000 mg | ORAL_TABLET | Freq: Two times a day (BID) | ORAL | 0 refills | Status: DC

## 2023-03-05 NOTE — Assessment & Plan Note (Signed)
Her LDL cholesterol has improved down to 89.  She is currently on atorvastatin 20 mg a day without any adverse effects.  She will continue statin therapy

## 2023-03-05 NOTE — Assessment & Plan Note (Signed)
Her most recent A1c has gone up to 6.1.  Lab Results  Component Value Date   HGBA1C 6.1 (H) 01/12/2023   HGBA1C 5.6 02/22/2013    Patient aware of disease state and risk of progression. This may contribute to abnormal cravings, fatigue and diabetic complications without having diabetes.   She has lost close to 10% of her body weight but despite this her A1c continues to rise.  This may be due to age and genetic factors.  Advised to maintain a diet low on simple and processed carbohydrates.  After discussion of benefits and side effects she is agreeable to starting metformin for pharmacoprophylaxis and also incretin effect.  She will start metformin XR 500 mg in the morning for 7 days and then increase to twice daily if no side effects.

## 2023-03-05 NOTE — Assessment & Plan Note (Signed)
She has lost 10% of total body weight via nutritional and behavioral strategies.  Her BIA shows a reduction in body fat percentage with relative preservation of muscle mass.  Her resting energy expenditure is approximately 1250.  Her weight loss will be gradual and certainly doable we just have to target a calorie range of about 1000 kcal/day and 90 g of protein.  She also continues to increase the volume of physical activity.    She is noticing increasing hunger as she restricts her calories.  She will be started on metformin for incretin effect.  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  See obesity management plan

## 2023-03-05 NOTE — Progress Notes (Signed)
Office: [phone]  /  Fax: [phone]  WEIGHT SUMMARY AND BIOMETRICS  Vitals Temp: 97.6 F (36.4 C) BP: 113/79 Pulse Rate: 70 SpO2: 97 %   Anthropometric Measurements Height: 5' (1.524 m) Weight: 150 lb (68 kg) BMI (Calculated): 29.3 Weight at Last Visit: 151 lb Weight Lost Since Last Visit: 1 lb Weight Gained Since Last Visit: 0 lb Starting Weight: 159 lb Total Weight Loss (lbs): 9 lb (4.082 kg) Peak Weight: 172 lb   Body Composition  Body Fat %: 41 % Fat Mass (lbs): 61.8 lbs Muscle Mass (lbs): 84.4 lbs Total Body Water (lbs): 60.4 lbs Visceral Fat Rating : 11    No data recorded Today's Visit #: 8  Starting Date: 08/27/22   HPI  Chief Complaint: OBESITY  Carol [name] is here to discuss her progress with her obesity treatment plan. She is on the the Category 1 Plan and states she is following her eating plan approximately 80 % of the time. She states she is exercising 120 minutes 3 times per week.  Interval History:  Since last office visit she has lost 1 pounds. She reports good adherence to reduced calorie nutritional plan. She has been working on not skipping meals, increasing protein intake at every meal, eating more fruits, eating more vegetables, drinking more water, and increased physical activity levels  Orexigenic Control: Reports problems with appetite and hunger signals particularly days when she works out.  Has started to incorporate a meal replacement a few times a week. Denies problems with satiety and satiation.  Denies problems with eating patterns and portion control.  Denies abnormal cravings. Denies feeling deprived or restricted.   Barriers identified:  Slow metabolism and menopause .   Pharmacotherapy for weight loss: She is currently taking no anti-obesity medication.    ASSESSMENT AND PLAN  TREATMENT PLAN FOR OBESITY:  Recommended Dietary Goals  Carol [name] is currently in the action stage of change. As such, her goal is to  continue weight management plan. She has agreed to: continue current plan and incorporate 5:2 intermittent fast.  So 5 days out of the week she will try to stay under 1200 cal, and 2 nonconsecutive days out of the week she will aim for 600 cal.  She may use meal replacements to accomplish this.  She will do this on her non- workout days  Behavioral Intervention  We discussed the following Behavioral Modification Strategies today: increasing lean protein intake, decreasing simple carbohydrates , increasing vegetables, increasing lower glycemic fruits, increasing fiber rich foods, increasing water intake, and increase calories on her workout days especially if she is staying hungry. .  Additional resources provided today: None, Handout on practicing mindfulness around eating, and Handout on intermittent fasting  Recommended Physical Activity Goals  Carol [name] has been advised to work up to 150 minutes of moderate intensity aerobic activity a week and strengthening exercises 2-3 times per week for cardiovascular health, weight loss maintenance and preservation of muscle mass.   She has agreed to :  Continue current level of physical activity   Pharmacotherapy We discussed various medication options to help Carol [name] with her weight loss efforts and we both agreed to :  Start metformin 500 mg XR 1 tablet twice a day for incretin effect and diabetes prevention  ASSOCIATED CONDITIONS ADDRESSED TODAY  Prediabetes Assessment & Plan: Her most recent A1c has gone up to 6.1.  Lab Results  Component Value Date   HGBA1C 6.1 (H) 01/12/2023   HGBA1C 5.6 02/22/2013    Patient  aware of disease state and risk of progression. This may contribute to abnormal cravings, fatigue and diabetic complications without having diabetes.   She has lost close to 10% of her body weight but despite this her A1c continues to rise.  This may be due to age and genetic factors.  Advised to maintain a diet low on simple and  processed carbohydrates.  After discussion of benefits and side effects she is agreeable to starting metformin for pharmacoprophylaxis and also incretin effect.  She will start metformin XR 500 mg in the morning for 7 days and then increase to twice daily if no side effects.   Orders: -     metFORMIN HCl ER; Take 1 tablet (500 mg total) by mouth 2 (two) times daily with a meal.  Dispense: 60 tablet; Refill: 0  Class 1 obesity with serious comorbidity and body mass index (BMI) of 31.0 to 31.9 in adult, unspecified obesity type Assessment & Plan: She has lost 10% of total body weight via nutritional and behavioral strategies.  Her BIA shows a reduction in body fat percentage with relative preservation of muscle mass.  Her resting energy expenditure is approximately 1250.  Her weight loss will be gradual and certainly doable we just have to target a calorie range of about 1000 kcal/day and 90 g of protein.  She also continues to increase the volume of physical activity.    She is noticing increasing hunger as she restricts her calories.  She will be started on metformin for incretin effect.  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  See obesity management plan   Hyperlipidemia with target LDL less than 100 Assessment & Plan: Her LDL cholesterol has improved down to 89.  She is currently on atorvastatin 20 mg a day without any adverse effects.  She will continue statin therapy     PHYSICAL EXAM:  Blood pressure 113/79, pulse 70, temperature 97.6 F (36.4 C), height 5' (1.524 m), weight 150 lb (68 kg), last menstrual period 06/23/1990, SpO2 97%. Body mass index is 29.29 kg/m.  General: She is overweight, cooperative, alert, well developed, and in no acute distress. PSYCH: Has normal mood, affect and thought process.   HEENT: EOMI, sclerae are anicteric. Lungs: Normal breathing effort, no conversational  dyspnea. Extremities: No edema.  Neurologic: No gross sensory or motor deficits. No tremors or fasciculations noted.    DIAGNOSTIC DATA REVIEWED:  BMET    Component Value Date/Time   NA 142 09/16/2022 0933   K 4.5 09/16/2022 0933   CL 105 09/16/2022 0933   CO2 22 09/16/2022 0933   GLUCOSE 107 (H) 09/16/2022 0933   GLUCOSE 143 (H) 05/07/2021 0442   BUN 22 09/16/2022 0933   CREATININE 0.95 09/16/2022 0933   CREATININE 0.76 03/16/2017 1354   CALCIUM 9.5 09/16/2022 0933   GFRNONAA >60 05/07/2021 0442   GFRAA 79 10/21/2019 1326   Lab Results  Component Value Date   HGBA1C 6.1 (H) 01/12/2023   HGBA1C 5.6 02/22/2013   Lab Results  Component Value Date   INSULIN 11.1 09/16/2022   Lab Results  Component Value Date   TSH 2.380 08/18/2022   CBC    Component Value Date/Time   WBC 7.4 04/23/2022 1331   WBC 14.2 (H) 05/07/2021 0446   RBC 4.76 04/23/2022 1331   RBC 3.78 (L) 05/07/2021 0446   HGB 14.2 04/23/2022 1331   HGB 14.3 05/21/2016 1128   HCT  42.1 04/23/2022 1331   PLT 307 04/23/2022 1331   MCV 88 04/23/2022 1331   MCH 29.8 04/23/2022 1331   MCH 29.1 05/07/2021 0446   MCHC 33.7 04/23/2022 1331   MCHC 33.3 05/07/2021 0446   RDW 12.8 04/23/2022 1331   Iron Studies    Component Value Date/Time   IRON 47 10/22/2022 1430   TIBC 323 10/22/2022 1430   FERRITIN 166 (H) 10/22/2022 1430   IRONPCTSAT 15 10/22/2022 1430   Lipid Panel     Component Value Date/Time   CHOL 162 01/12/2023 1213   TRIG 135 01/12/2023 1213   HDL 49 01/12/2023 1213   CHOLHDL 3.6 04/23/2022 1331   CHOLHDL 3.2 05/21/2016 1407   VLDL 44 (H) 05/21/2016 1407   LDLCALC 89 01/12/2023 1213   Hepatic Function Panel     Component Value Date/Time   PROT 7.4 09/16/2022 0933   ALBUMIN 4.8 09/16/2022 0933   AST 24 09/16/2022 0933   ALT 25 09/16/2022 0933   ALKPHOS 103 09/16/2022 0933   BILITOT 0.2 09/16/2022 0933      Component Value Date/Time   TSH 2.380 08/18/2022 1310   Nutritional Lab  Results  Component Value Date   VD25OH 51.2 04/23/2022   VD25OH 39.4 05/27/2017   VD25OH 31 05/21/2016     Return in about 6 weeks (around 04/16/2023) for For Weight Mangement with Dr. Rikki Spearing.Marland Kitchen She was informed of the importance of frequent follow up visits to maximize her success with intensive lifestyle modifications for her multiple health conditions.   ATTESTASTION STATEMENTS:  Reviewed by clinician on day of visit: allergies, medications, problem list, medical history, surgical history, family history, social history, and previous encounter notes.     Worthy Rancher, MD

## 2023-03-06 ENCOUNTER — Other Ambulatory Visit (INDEPENDENT_AMBULATORY_CARE_PROVIDER_SITE_OTHER): Payer: PPO

## 2023-03-06 DIAGNOSIS — Z23 Encounter for immunization: Secondary | ICD-10-CM

## 2023-04-15 DIAGNOSIS — D3132 Benign neoplasm of left choroid: Secondary | ICD-10-CM | POA: Diagnosis not present

## 2023-04-16 ENCOUNTER — Encounter (INDEPENDENT_AMBULATORY_CARE_PROVIDER_SITE_OTHER): Payer: Self-pay | Admitting: Internal Medicine

## 2023-04-16 ENCOUNTER — Ambulatory Visit (INDEPENDENT_AMBULATORY_CARE_PROVIDER_SITE_OTHER): Payer: PPO | Admitting: Internal Medicine

## 2023-04-16 VITALS — BP 120/75 | HR 84 | Temp 97.9°F | Ht 60.0 in | Wt 151.0 lb

## 2023-04-16 DIAGNOSIS — R7303 Prediabetes: Secondary | ICD-10-CM

## 2023-04-16 DIAGNOSIS — Z6831 Body mass index (BMI) 31.0-31.9, adult: Secondary | ICD-10-CM | POA: Diagnosis not present

## 2023-04-16 DIAGNOSIS — E66811 Obesity, class 1: Secondary | ICD-10-CM

## 2023-04-16 MED ORDER — METFORMIN HCL ER 500 MG PO TB24: 500.0000 mg | ORAL_TABLET | Freq: Two times a day (BID) | ORAL | 0 refills | Status: DC

## 2023-04-16 NOTE — Assessment & Plan Note (Signed)
 See obesity treatment plan

## 2023-04-16 NOTE — Progress Notes (Signed)
Office: [phone]  /  Fax: [phone]  WEIGHT SUMMARY AND BIOMETRICS  Vitals Temp: 97.9 F (36.6 C) BP: 120/75 Pulse Rate: 84 SpO2: 99 %   Anthropometric Measurements Height: 5' (1.524 m) Weight: 151 lb (68.5 kg) BMI (Calculated): 29.49 Weight at Last Visit: 150 lb Weight Lost Since Last Visit: 0 lb Weight Gained Since Last Visit: 1 lb Starting Weight: 159 lb Total Weight Loss (lbs): 8 lb (3.629 kg) Peak Weight: 172 lb   Body Composition  Body Fat %: 39.4 % Fat Mass (lbs): 59.4 lbs Muscle Mass (lbs): 87 lbs Total Body Water (lbs): 57.4 lbs Visceral Fat Rating : 11    No data recorded Today's Visit #: 9  Starting Date: 08/27/22   HPI  Chief Complaint: OBESITY  Carol [name] is here to discuss her progress with her obesity treatment plan. She is on the the Category 1 Plan and states she is following her eating plan approximately 50 % of the time. She states she is exercising 60-90 minutes 3 times per week, and chair yoga 30 minutes per day 2 times a week.  Interval History:  Discussed the use of AI scribe software for clinical note transcription with the patient, who gave verbal consent to proceed.  History of Present Illness   The patient, with a history of prediabetes managed with metformin, presents for a weight management consultation. She reports a recent vacation during which dietary habits were not optimal, but she managed to maintain her weight with only a one-pound gain. She attributes this to making healthier food choices and staying physically active, despite not being able to adhere to her usual exercise routine of daily walks.  Upon returning from vacation, the patient expresses a desire to resume her lifestyle changes, focusing on lowering carbohydrate intake and increasing dietary fiber. She plans to incorporate more fruits and vegetables into her diet and increase protein intake. She has been using a tracking app to monitor her macronutrient intake  and finds it helpful.  The patient has been taking metformin twice daily, which she reports has reduced her hunger and increased her sense of fullness, although it occasionally causes mild nausea. She has found that taking the medication with food mitigates this side effect.  The patient's goal for the next few weeks is to focus on dietary changes, specifically increasing protein and fiber intake while reducing carbohydrates. She plans to resume her regular exercise routine of walking two miles three times a week. She expresses a commitment to her weight management plan and is scheduled for a follow-up consultation in four weeks.       Pharmacotherapy for weight loss: She is currently taking Metformin (off label use for incretin effect and / or insulin resistance and / or diabetes prevention) with adequate clinical response  and without side effects..    ASSESSMENT AND PLAN  TREATMENT PLAN FOR OBESITY:  Recommended Dietary Goals  Carol [name] is currently in the action stage of change. As such, her goal is to continue weight management plan. She has agreed to: continue current plan  Behavioral Intervention  We discussed the following Behavioral Modification Strategies today: continue to work on maintaining a reduced calorie state, getting the recommended amount of protein, incorporating whole foods, making healthy choices, staying well hydrated and practicing mindfulness when eating..  Additional resources provided today: None  Recommended Physical Activity Goals  [name] has been advised to work up to 150 minutes of moderate intensity aerobic activity a week and strengthening exercises 2-3 times per  week for cardiovascular health, weight loss maintenance and preservation of muscle mass.   She has agreed to :  Think about enjoyable ways to increase daily physical activity and overcoming barriers to exercise and Increase physical activity in their day and reduce sedentary time (increase  NEAT).  Pharmacotherapy We discussed various medication options to help Carol [name] with her weight loss efforts and we both agreed to : continue current anti-obesity medication regimen  ASSOCIATED CONDITIONS ADDRESSED TODAY  Class 1 obesity with serious comorbidity and body mass index (BMI) of 31.0 to 31.9 in adult, unspecified obesity type Assessment & Plan: See obesity treatment plan   Prediabetes Assessment & Plan: Her most recent A1c has gone up to 6.1.  Lab Results  Component Value Date   HGBA1C 6.1 (H) 01/12/2023   HGBA1C 5.6 02/22/2013    Patient aware of disease state and risk of progression. This may contribute to abnormal cravings, fatigue and diabetic complications without having diabetes.   She has lost close to 10% of her body weight but despite this her A1c continues to rise.  This may be due to age and genetic factors.  Advised to maintain a diet low on simple and processed carbohydrates.  We reviewed carb insulin model of obesity today.  She is now on metformin XR 500 mg twice a day for pharmacoprophylaxis she is tolerating medication well occasionally has upset stomach but not significant.  She would like to continue medication.  Medication refilled today.   Orders: -     metFORMIN HCl ER; Take 1 tablet (500 mg total) by mouth 2 (two) times daily with a meal.  Dispense: 180 tablet; Refill: 0    PHYSICAL EXAM:  Blood pressure 120/75, pulse 84, temperature 97.9 F (36.6 C), height 5' (1.524 m), weight 151 lb (68.5 kg), last menstrual period 06/23/1990, SpO2 99%. Body mass index is 29.49 kg/m.  General: She is overweight, cooperative, alert, well developed, and in no acute distress. PSYCH: Has normal mood, affect and thought process.   HEENT: EOMI, sclerae are anicteric. Lungs: Normal breathing effort, no conversational dyspnea. Extremities: No edema.  Neurologic: No gross sensory or motor deficits. No tremors or fasciculations noted.    DIAGNOSTIC DATA  REVIEWED:  BMET    Component Value Date/Time   NA 142 09/16/2022 0933   K 4.5 09/16/2022 0933   CL 105 09/16/2022 0933   CO2 22 09/16/2022 0933   GLUCOSE 107 (H) 09/16/2022 0933   GLUCOSE 143 (H) 05/07/2021 0442   BUN 22 09/16/2022 0933   CREATININE 0.95 09/16/2022 0933   CREATININE 0.76 03/16/2017 1354   CALCIUM 9.5 09/16/2022 0933   GFRNONAA >60 05/07/2021 0442   GFRAA 79 10/21/2019 1326   Lab Results  Component Value Date   HGBA1C 6.1 (H) 01/12/2023   HGBA1C 5.6 02/22/2013   Lab Results  Component Value Date   INSULIN 11.1 09/16/2022   Lab Results  Component Value Date   TSH 2.380 08/18/2022   CBC    Component Value Date/Time   WBC 7.4 04/23/2022 1331   WBC 14.2 (H) 05/07/2021 0446   RBC 4.76 04/23/2022 1331   RBC 3.78 (L) 05/07/2021 0446   HGB 14.2 04/23/2022 1331   HGB 14.3 05/21/2016 1128   HCT 42.1 04/23/2022 1331   PLT 307 04/23/2022 1331   MCV 88 04/23/2022 1331   MCH 29.8 04/23/2022 1331   MCH 29.1 05/07/2021 0446   MCHC 33.7 04/23/2022 1331   MCHC 33.3 05/07/2021 0446   RDW  12.8 04/23/2022 1331   Iron Studies    Component Value Date/Time   IRON 47 10/22/2022 1430   TIBC 323 10/22/2022 1430   FERRITIN 166 (H) 10/22/2022 1430   IRONPCTSAT 15 10/22/2022 1430   Lipid Panel     Component Value Date/Time   CHOL 162 01/12/2023 1213   TRIG 135 01/12/2023 1213   HDL 49 01/12/2023 1213   CHOLHDL 3.6 04/23/2022 1331   CHOLHDL 3.2 05/21/2016 1407   VLDL 44 (H) 05/21/2016 1407   LDLCALC 89 01/12/2023 1213   Hepatic Function Panel     Component Value Date/Time   PROT 7.4 09/16/2022 0933   ALBUMIN 4.8 09/16/2022 0933   AST 24 09/16/2022 0933   ALT 25 09/16/2022 0933   ALKPHOS 103 09/16/2022 0933   BILITOT 0.2 09/16/2022 0933      Component Value Date/Time   TSH 2.380 08/18/2022 1310   Nutritional Lab Results  Component Value Date   VD25OH 51.2 04/23/2022   VD25OH 39.4 05/27/2017   VD25OH 31 05/21/2016     Return in about 4 weeks  (around 05/14/2023) for For Weight Mangement with Dr. Rikki Spearing.Marland Kitchen She was informed of the importance of frequent follow up visits to maximize her success with intensive lifestyle modifications for her multiple health conditions.   ATTESTASTION STATEMENTS:  Reviewed by clinician on day of visit: allergies, medications, problem list, medical history, surgical history, family history, social history, and previous encounter notes.     Worthy Rancher, MD

## 2023-04-16 NOTE — Assessment & Plan Note (Signed)
Her most recent A1c has gone up to 6.1.  Lab Results  Component Value Date   HGBA1C 6.1 (H) 01/12/2023   HGBA1C 5.6 02/22/2013    Patient aware of disease state and risk of progression. This may contribute to abnormal cravings, fatigue and diabetic complications without having diabetes.   She has lost close to 10% of her body weight but despite this her A1c continues to rise.  This may be due to age and genetic factors.  Advised to maintain a diet low on simple and processed carbohydrates.  We reviewed carb insulin model of obesity today.  She is now on metformin XR 500 mg twice a day for pharmacoprophylaxis she is tolerating medication well occasionally has upset stomach but not significant.  She would like to continue medication.  Medication refilled today.

## 2023-04-21 ENCOUNTER — Emergency Department (HOSPITAL_COMMUNITY): Payer: PPO

## 2023-04-21 ENCOUNTER — Emergency Department (HOSPITAL_COMMUNITY)
Admission: EM | Admit: 2023-04-21 | Discharge: 2023-04-21 | Disposition: A | Discharge status: Home / Self Care | Payer: PPO | Attending: Emergency Medicine | Admitting: Emergency Medicine

## 2023-04-21 DIAGNOSIS — Y92480 Sidewalk as the place of occurrence of the external cause: Secondary | ICD-10-CM | POA: Insufficient documentation

## 2023-04-21 DIAGNOSIS — Y9301 Activity, walking, marching and hiking: Secondary | ICD-10-CM | POA: Insufficient documentation

## 2023-04-21 DIAGNOSIS — R519 Headache, unspecified: Secondary | ICD-10-CM | POA: Diagnosis present

## 2023-04-21 DIAGNOSIS — W010XXA Fall on same level from slipping, tripping and stumbling without subsequent striking against object, initial encounter: Secondary | ICD-10-CM | POA: Diagnosis not present

## 2023-04-21 DIAGNOSIS — M25522 Pain in left elbow: Secondary | ICD-10-CM | POA: Diagnosis not present

## 2023-04-21 DIAGNOSIS — W19XXXA Unspecified fall, initial encounter: Secondary | ICD-10-CM | POA: Diagnosis not present

## 2023-04-21 DIAGNOSIS — Z043 Encounter for examination and observation following other accident: Secondary | ICD-10-CM | POA: Diagnosis not present

## 2023-04-21 DIAGNOSIS — M25532 Pain in left wrist: Secondary | ICD-10-CM | POA: Diagnosis not present

## 2023-04-21 DIAGNOSIS — M25521 Pain in right elbow: Secondary | ICD-10-CM | POA: Diagnosis not present

## 2023-04-21 DIAGNOSIS — M25531 Pain in right wrist: Secondary | ICD-10-CM | POA: Diagnosis not present

## 2023-04-21 DIAGNOSIS — S0091XA Abrasion of unspecified part of head, initial encounter: Secondary | ICD-10-CM | POA: Diagnosis not present

## 2023-04-21 DIAGNOSIS — S59909A Unspecified injury of unspecified elbow, initial encounter: Secondary | ICD-10-CM | POA: Diagnosis not present

## 2023-04-21 DIAGNOSIS — S5002XA Contusion of left elbow, initial encounter: Secondary | ICD-10-CM | POA: Diagnosis not present

## 2023-04-21 DIAGNOSIS — S59202A Unspecified physeal fracture of lower end of radius, left arm, initial encounter for closed fracture: Secondary | ICD-10-CM | POA: Diagnosis not present

## 2023-04-21 DIAGNOSIS — S5001XA Contusion of right elbow, initial encounter: Secondary | ICD-10-CM | POA: Diagnosis not present

## 2023-04-21 DIAGNOSIS — S0083XA Contusion of other part of head, initial encounter: Secondary | ICD-10-CM

## 2023-04-21 MED ORDER — ACETAMINOPHEN 500 MG PO TABS: 500.0000 mg | ORAL_TABLET | Freq: Four times a day (QID) | ORAL | 0 refills | Status: DC | PRN

## 2023-04-21 MED ORDER — IBUPROFEN 200 MG PO TABS
600.0000 mg | ORAL_TABLET | Freq: Once | ORAL | Status: AC
  Administered 2023-04-21: 600 mg via ORAL
  Filled 2023-04-21: qty 3

## 2023-04-21 MED ORDER — HYDROCODONE-ACETAMINOPHEN 5-325 MG PO TABS
1.0000 | ORAL_TABLET | Freq: Once | ORAL | Status: AC
  Administered 2023-04-21: 1 via ORAL
  Filled 2023-04-21: qty 1

## 2023-04-21 MED ORDER — IBUPROFEN 600 MG PO TABS: 600.0000 mg | ORAL_TABLET | Freq: Three times a day (TID) | ORAL | 0 refills | Status: DC | PRN

## 2023-04-21 NOTE — ED Triage Notes (Signed)
[place] [facility] [place] wire across the sidewalk. Landed on elbows, face, knees. No LOC, no thinners. Abrasions to right cheek, nose, upper lip, right pinky. Slightly blurred vision in right eye. Some R hip pain, no swelling or deformity. Ambulatory.  138/98 96% HR 85

## 2023-04-21 NOTE — Discharge Instructions (Addendum)
We saw you in the ER after you had a fall. All the imaging results are normal, no fractures seen. No evidence of brain bleed. Please be very careful with walking, and do everything possible to prevent falls.  Apply ice to the affected area for the next 24 hours.  Take Motrin for pain control, with additional Tylenol as needed.

## 2023-04-21 NOTE — ED Notes (Signed)
Pt rounding. Pt has no needs at this time. Will continue to monitor. Bed locked and in lowest position. Call light within reach. Vitals WNL.

## 2023-04-21 NOTE — ED Provider Notes (Signed)
[place] EMERGENCY DEPARTMENT AT [facility] Provider Note   CSN: [id number] Arrival date & time: 04/21/23  5284     History  Chief Complaint  Patient presents with   Marletta Lor    NAO CALLICOAT is a 72 y.o. female.  HPI    72 year old female comes in with chief complaint of mechanical fall.  Patient indicates that she was walking downtown, when she tripped over a wire and fell forward.  She broke her fall with her bilateral upper extremity, but still struck her face onto the concrete.  She did not lose consciousness, but felt that she might.  She is currently having headache, nausea.  Patient has ambulated since the fall.  She is complaining of significant discomfort over both her upper extremities.  Home Medications Prior to Admission medications   Medication Sig Start Date End Date Taking? Authorizing Provider  acetaminophen (TYLENOL) 500 MG tablet Take 1 tablet (500 mg total) by mouth every 6 (six) hours as needed. 04/21/23  Yes Derwood Kaplan, MD  ibuprofen (ADVIL) 600 MG tablet Take 1 tablet (600 mg total) by mouth every 8 (eight) hours as needed. 04/21/23  Yes Isolde Skaff, MD  albuterol (PROAIR HFA) 108 (90 Base) MCG/ACT inhaler INHALE 2 PUFFS INTO THE LUNGS EVERY 6 HOURS AS NEEDED FOR WHEEZING/ SHORTNESS OF BREATH. 04/23/22   Ronnald Nian, MD  Apoaequorin (PREVAGEN) 10 MG CAPS Take by mouth in the morning.    [provider]  atorvastatin (LIPITOR) 20 MG tablet Take 1 tablet (20 mg total) by mouth daily. 04/23/22   Ronnald Nian, MD  azithromycin (ZITHROMAX) 500 MG tablet Take 1 tablet (500 mg total) by mouth daily. 02/11/23   Ronnald Nian, MD  diclofenac sodium (VOLTAREN) 1 % GEL Apply 2 g topically 4 (four) times daily.    [provider]  DOXYCYCLINE HYCLATE PO Take 100 mg by mouth as needed.    [provider]  ibandronate (BONIVA) 150 MG tablet Take 1 tablet (150 mg total) by mouth every 30 (thirty) days. Take in the  morning with a full glass of water, on an empty stomach, and do not take anything else by mouth or lie down for the next 30 min. 04/23/22   Ronnald Nian, MD  metFORMIN (GLUCOPHAGE-XR) 500 MG 24 hr tablet Take 1 tablet (500 mg total) by mouth 2 (two) times daily with a meal. 04/16/23   Worthy Rancher, MD  methocarbamol (ROBAXIN) 500 MG tablet Take 500 mg by mouth daily as needed (migraine). 05/31/18   [provider]  Multiple Vitamins-Minerals (CENTRUM SILVER 50+WOMEN PO) Take by mouth in the morning.    [provider]  NON FORMULARY Viviscal    [provider]  pantoprazole (PROTONIX) 40 MG tablet Take 1 tablet (40 mg total) by mouth daily. 04/23/22   Ronnald Nian, MD  Ubrogepant (UBRELVY) 50 MG TABS Take 50 mg by mouth as needed.    [provider]  valACYclovir (VALTREX) 500 MG tablet Take 1 tablet (500 mg total) by mouth daily. 04/23/22   Ronnald Nian, MD  zonisamide (ZONEGRAN) 100 MG capsule Take 100 mg by mouth 2 (two) times daily.    [provider]      Allergies    Iohexol, Ivp dye [iodinated contrast media], Codeine, and Onion    Review of Systems   Review of Systems  All other systems reviewed and are negative.   Physical Exam Updated Vital Signs BP  131/81   Pulse 72   Temp 98 F (36.7 C) (Oral)   Resp 17   LMP 06/23/1990 (Approximate)   SpO2 94%  Physical Exam Vitals and nursing note reviewed.  Constitutional:      Appearance: She is well-developed.  HENT:     Head: Atraumatic.  Eyes:     Extraocular Movements: Extraocular movements intact.     Pupils: Pupils are equal, round, and reactive to light.  Neck:     Comments: No midline c-spine tenderness, pt able to turn head to 45 degrees bilaterally without any pain and able to flex neck to the chest and extend without any pain or neurologic symptoms.  Cardiovascular:     Rate and Rhythm: Normal rate.  Pulmonary:     Effort: Pulmonary effort is normal.   Abdominal:     Tenderness: There is no abdominal tenderness.  Musculoskeletal:     Cervical back: Normal range of motion and neck supple.     Comments: Patient has road rash/abrasion to the right side of the face. Patient has tenderness to palpation of both elbows and left wrist.  Patient able to forward flex, pronate, supinate the upper extremities, but has some discomfort with it.  She is able to flex her elbows.  Otherwise: Head to toe evaluation shows no other areas of hematoma, bleeding of the scalp, no facial abrasions, no spine step offs, crepitus of the chest or neck, no tenderness to palpation of the bilateral upper and lower extremities, no gross deformities, no chest tenderness, no pelvic pain.   Skin:    General: Skin is warm and dry.  Neurological:     Mental Status: She is alert and oriented to person, place, and time.     ED Results / Procedures / Treatments   Labs (all labs ordered are listed, but only abnormal results are displayed) Labs Reviewed - No data to display  EKG None  Radiology CT Head Wo Contrast  Result Date: 04/21/2023 CLINICAL DATA:  Fall with abrasion EXAM: CT HEAD WITHOUT CONTRAST TECHNIQUE: Contiguous axial images were obtained from the base of the skull through the vertex without intravenous contrast. RADIATION DOSE REDUCTION: This exam was performed according to the departmental dose-optimization program which includes automated exposure control, adjustment of the mA and/or kV according to patient size and/or use of iterative reconstruction technique. COMPARISON:  01/07/2022 FINDINGS: Brain: No evidence of acute infarction, hemorrhage, hydrocephalus, extra-axial collection or mass lesion/mass effect. Vascular: No hyperdense vessel or unexpected calcification. Skull: Normal. Negative for fracture or focal lesion. Sinuses/Orbits: No acute finding. IMPRESSION: No evidence of injury. Electronically Signed   By: Tiburcio Pea M.D.   On: 04/21/2023 11:59    DG Elbow Complete Right  Result Date: 04/21/2023 CLINICAL DATA:  Mechanical fall today.  Bilateral elbow pain. EXAM: RIGHT ELBOW - COMPLETE 3+ VIEW COMPARISON:  Right forearm radiographs 08/15/2014 FINDINGS: Normal bone mineralization. Normal position of the distal anterior humeral fat pad without evidence of elbow joint effusion. Minimal chronic enthesopathic change at the common extensor tendon origin at the lateral epicondyle. Joint spaces are preserved. No acute fracture or dislocation. IMPRESSION: 1. No acute fracture or dislocation. 2. Minimal chronic enthesopathic change at the common extensor tendon origin at the lateral epicondyle. Electronically Signed   By: Neita Garnet M.D.   On: 04/21/2023 11:34   DG Elbow Complete Left  Result Date: 04/21/2023 CLINICAL DATA:  Fall on outstretched hand today. Bilateral elbow pain. EXAM: LEFT ELBOW - COMPLETE 3+ VIEW  COMPARISON:  Left elbow radiographs 01/07/2022 FINDINGS: There is diffuse decreased bone mineralization. Joint spaces are preserved. Normal position of the distal anterior humeral fat pad without evidence of elbow joint effusion. No acute fracture is seen. No dislocation. IMPRESSION: No acute fracture is seen. Electronically Signed   By: Neita Garnet M.D.   On: 04/21/2023 11:33   DG Wrist Complete Left  Result Date: 04/21/2023 CLINICAL DATA:  Fall on outstretched hand. Mechanical fall today. History of left wrist fracture. EXAM: LEFT WRIST - COMPLETE 3+ VIEW COMPARISON:  Left wrist radiographs 01/07/2022 FINDINGS: There is diffuse decreased bone mineralization. Interval healing of the previously seen distal radial metaphyseal nondisplaced fracture. Minimal distal radioulnar joint peripheral degenerative spurring is similar to prior. Mild thumb carpometacarpal joint space narrowing and peripheral osteophytosis. No acute fracture is seen. No dislocation. IMPRESSION: 1. Within the limitations of diffuse decreased bone mineralization, no acute  fracture is seen. 2. Interval healing of the previously seen distal radial metaphyseal nondisplaced fracture. Electronically Signed   By: Neita Garnet M.D.   On: 04/21/2023 11:32    Procedures Procedures    Medications Ordered in ED Medications  HYDROcodone-acetaminophen (NORCO/VICODIN) 5-325 MG per tablet 1 tablet (1 tablet Oral Given 04/21/23 1027)  ibuprofen (ADVIL) tablet 600 mg (600 mg Oral Given 04/21/23 1203)    ED Course/ Medical Decision Making/ A&P                                 Medical Decision Making Amount and/or Complexity of Data Reviewed Radiology: ordered.  Risk OTC drugs. Prescription drug management.  72 year old patient comes in after sustaining what appears to be a mechanical fall. Pertinent past medical includes -thrombocytopenia, no use of anticoagulation currently Collateral history provided by spouse who is at the bedside.  Based on my history and exam, differential diagnosis includes: - Traumatic brain injury including intracranial hemorrhage - Long bone fractures - Contusions - Soft tissue injury - Concussion -Facial fracture -Wrist contusion/fracture -Elbow contusion/fracture  Based on the initial assessment, the following workup was initiated CT scan of the brain, x-ray of the bilateral elbow and left wrist. I feel comfortable clearing C-spine clinically.  Additionally, patient has no nystagmus, diplopia -CT face not indicated.  I have independently interpreted the following imaging from the perspective of acute trauma: X-ray of the elbow bilaterally, x-ray of wrist and the results indicate no evidence of any displaced fracture.   Final Clinical Impression(s) / ED Diagnoses Final diagnoses:  Fall, initial encounter  Contusion of face, initial encounter  Pain in both wrists  Pain of both elbows    Rx / DC Orders ED Discharge Orders          Ordered    ibuprofen (ADVIL) 600 MG tablet  Every 8 hours PRN        04/21/23 1127     acetaminophen (TYLENOL) 500 MG tablet  Every 6 hours PRN        04/21/23 1127              Derwood Kaplan, MD 04/21/23 1217

## 2023-05-01 DIAGNOSIS — M25522 Pain in left elbow: Secondary | ICD-10-CM | POA: Diagnosis not present

## 2023-05-01 DIAGNOSIS — M25561 Pain in right knee: Secondary | ICD-10-CM | POA: Diagnosis not present

## 2023-05-01 DIAGNOSIS — M79601 Pain in right arm: Secondary | ICD-10-CM | POA: Diagnosis not present

## 2023-05-03 ENCOUNTER — Other Ambulatory Visit: Payer: Self-pay | Admitting: Family Medicine

## 2023-05-03 DIAGNOSIS — M810 Age-related osteoporosis without current pathological fracture: Secondary | ICD-10-CM

## 2023-05-04 ENCOUNTER — Other Ambulatory Visit: Payer: Self-pay | Admitting: Family Medicine

## 2023-05-04 DIAGNOSIS — E785 Hyperlipidemia, unspecified: Secondary | ICD-10-CM

## 2023-05-05 DIAGNOSIS — M79641 Pain in right hand: Secondary | ICD-10-CM | POA: Diagnosis not present

## 2023-05-05 DIAGNOSIS — M25522 Pain in left elbow: Secondary | ICD-10-CM | POA: Diagnosis not present

## 2023-05-14 ENCOUNTER — Encounter (INDEPENDENT_AMBULATORY_CARE_PROVIDER_SITE_OTHER): Payer: Self-pay | Admitting: Internal Medicine

## 2023-05-14 ENCOUNTER — Ambulatory Visit (INDEPENDENT_AMBULATORY_CARE_PROVIDER_SITE_OTHER): Payer: PPO | Admitting: Internal Medicine

## 2023-05-14 VITALS — BP 126/80 | HR 80 | Temp 97.9°F | Ht 60.0 in | Wt 152.0 lb

## 2023-05-14 DIAGNOSIS — E669 Obesity, unspecified: Secondary | ICD-10-CM | POA: Diagnosis not present

## 2023-05-14 DIAGNOSIS — M80029D Age-related osteoporosis with current pathological fracture, unspecified humerus, subsequent encounter for fracture with routine healing: Secondary | ICD-10-CM | POA: Diagnosis not present

## 2023-05-14 DIAGNOSIS — M810 Age-related osteoporosis without current pathological fracture: Secondary | ICD-10-CM | POA: Insufficient documentation

## 2023-05-14 DIAGNOSIS — R7303 Prediabetes: Secondary | ICD-10-CM | POA: Diagnosis not present

## 2023-05-14 DIAGNOSIS — Z6829 Body mass index (BMI) 29.0-29.9, adult: Secondary | ICD-10-CM | POA: Diagnosis not present

## 2023-05-14 DIAGNOSIS — E66811 Obesity, class 1: Secondary | ICD-10-CM

## 2023-05-14 DIAGNOSIS — M8580 Other specified disorders of bone density and structure, unspecified site: Secondary | ICD-10-CM | POA: Insufficient documentation

## 2023-05-14 DIAGNOSIS — M8000XD Age-related osteoporosis with current pathological fracture, unspecified site, subsequent encounter for fracture with routine healing: Secondary | ICD-10-CM

## 2023-05-14 DIAGNOSIS — M8000XA Age-related osteoporosis with current pathological fracture, unspecified site, initial encounter for fracture: Secondary | ICD-10-CM | POA: Insufficient documentation

## 2023-05-14 MED ORDER — METFORMIN HCL ER 500 MG PO TB24: 500.0000 mg | ORAL_TABLET | Freq: Two times a day (BID) | ORAL | 0 refills | Status: DC

## 2023-05-14 NOTE — Progress Notes (Signed)
Office: [phone]  /  Fax: [phone]  Weight Summary And Biometrics  Vitals Temp: 97.9 F (36.6 C) BP: 126/80 Pulse Rate: 80 SpO2: 97 %   Anthropometric Measurements Height: 5' (1.524 m) Weight: 152 lb (68.9 kg) BMI (Calculated): 29.69 Weight at Last Visit: 151 lb Weight Lost Since Last Visit: 0 lb Weight Gained Since Last Visit: 1 lb Starting Weight: 159 lb Total Weight Loss (lbs): 7 lb (3.175 kg) Peak Weight: 172 lb   Body Composition  Body Fat %: 40.6 % Fat Mass (lbs): 61.8 lbs Muscle Mass (lbs): 85.8 lbs Total Body Water (lbs): 58.8 lbs Visceral Fat Rating : 11    No data recorded Today's Visit #: 10  Starting Date: 08/27/22   Subjective   Chief Complaint: Obesity  Carol [name] is here to discuss her progress with her obesity treatment plan. She is on the the Category 1 Plan and states she is following her eating plan approximately 50 % of the time. She states she is not exercising.  Interval History:    Discussed the use of AI scribe software for clinical note transcription with the patient, who gave verbal consent to proceed.  History of Present Illness   The patient, diagnosed with obesity and prediabetes, presents for a follow-up visit. She recently experienced a fall during a walk, resulting in a fractured elbow and other injuries. The fall was caused by a wire on the sidewalk that caught her foot, causing her to fall forward onto her arms, face, and knees. The patient reports that the most significant discomfort is in the range of motion in her wrists and radiating pain in her forearms. She is currently taking Boniva for osteopenia, a condition diagnosed previously.  The patient's recovery is ongoing, with daily improvements noted. However, she has not yet returned to her regular routine, including her usual physical activity of walking. She has been able to increase her activity in the kitchen, allowing for more meal preparation. The patient is also  on metformin, which she reports is helping with cravings and appetite control, with no side effects noted.  The patient's metabolic rate is slow, and she has been advised to focus on increasing protein intake to help boost her metabolic rate. The patient is planning to maintain her weight over the holiday season and then resume her weight loss efforts in the new year.     Orexigenic Control:  Denies problems with appetite and hunger signals.  Denies problems with satiety and satiation.  Denies problems with eating patterns and portion control.  Denies abnormal cravings. Denies feeling deprived or restricted.   Barriers identified: none.   Pharmacotherapy for weight loss: She is currently taking Metformin (off label use for incretin effect and / or insulin resistance and / or diabetes prevention) with adequate clinical response  and without side effects..   Assessment and Plan   Treatment Plan For Obesity:  Recommended Dietary Goals  Carol [name] is currently in the action stage of change. As such, her goal is to continue weight management plan. She has agreed to: continue current plan.  May incorporate protein shakes for convenience as a meal replacement.  Behavioral Intervention  We discussed the following Behavioral Modification Strategies today: continue to work on maintaining a reduced calorie state, getting the recommended amount of protein, incorporating whole foods, making healthy choices, staying well hydrated and practicing mindfulness when eating..  Additional resources provided today: Handout on traveling and holiday eating strategies  Recommended Physical Activity Goals  Carol [name] has  been advised to work up to 150 minutes of moderate intensity aerobic activity a week and strengthening exercises 2-3 times per week for cardiovascular health, weight loss maintenance and preservation of muscle mass.   She has agreed to :  continue to gradually increase the amount and intensity of  exercise while avoiding reinjury.  Pharmacotherapy  We discussed various medication options to help Carol [name] with her weight loss efforts and we both agreed to : continue current anti-obesity medication regimen  Associated Conditions Addressed Today  Assessment and Plan    Obesity   She is following up on obesity, working on meal prepping and increasing physical activity but faced challenges due to a recent fall. We discussed the importance of not overly restricting calories due to her slow metabolism and the benefits of protein intake to boost metabolic rate. We also discussed the potential use of Wegovy or Zepbound for weight loss, including benefits and risks. She will continue metformin, increase protein intake to 30 grams per meal, consider meal replacements for convenience, gradually increase physical activity as tolerated, and discuss the potential use of Wegovy or Zepan for weight loss if interested.  Prediabetes   She is currently on metformin, which helps with cravings and appetite control without reported side effects. She will continue metformin, and focus on maintaining a balanced diet with adequate protein.  We will check disease monitoring labs at a future visit.  Fractured Elbow   She sustained a fractured elbow from a fall, reporting pain and limited range of motion in both arms, particularly when turning the wrist. X-rays showed severe bruising of the bones. She will continue current pain management, gradually increase range of motion exercises as tolerated, and monitor for any signs of complications or delayed healing.  Osteoporosis I reviewed bone density test from October 2023 she had a T-score of -2.7 in her radius so she indeed has osteoporosis and recently fractured her elbow following a same level fall.  She is under the impression that she has osteopenia.  She is currently taking Boniva and calcium supplements. We discussed the increased risk of fractures.  She will  continue Boniva, continue calcium supplementation and vitamin D.  Increase weightbearing exercises as tolerated.            Objective   Physical Exam:  Blood pressure 126/80, pulse 80, temperature 97.9 F (36.6 C), height 5' (1.524 m), weight 152 lb (68.9 kg), last menstrual period 06/23/1990, SpO2 97%. Body mass index is 29.69 kg/m.  General: She is overweight, cooperative, alert, well developed, and in no acute distress. PSYCH: Has normal mood, affect and thought process.   HEENT: EOMI, sclerae are anicteric. Lungs: Normal breathing effort, no conversational dyspnea. Extremities: No edema.  Neurologic: No gross sensory or motor deficits. No tremors or fasciculations noted.    Diagnostic Data Reviewed:  BMET    Component Value Date/Time   NA 142 09/16/2022 0933   K 4.5 09/16/2022 0933   CL 105 09/16/2022 0933   CO2 22 09/16/2022 0933   GLUCOSE 107 (H) 09/16/2022 0933   GLUCOSE 143 (H) 05/07/2021 0442   BUN 22 09/16/2022 0933   CREATININE 0.95 09/16/2022 0933   CREATININE 0.76 03/16/2017 1354   CALCIUM 9.5 09/16/2022 0933   GFRNONAA >60 05/07/2021 0442   GFRAA 79 10/21/2019 1326   Lab Results  Component Value Date   HGBA1C 6.1 (H) 01/12/2023   HGBA1C 5.6 02/22/2013   Lab Results  Component Value Date   INSULIN 11.1 09/16/2022  Lab Results  Component Value Date   TSH 2.380 08/18/2022   CBC    Component Value Date/Time   WBC 7.4 04/23/2022 1331   WBC 14.2 (H) 05/07/2021 0446   RBC 4.76 04/23/2022 1331   RBC 3.78 (L) 05/07/2021 0446   HGB 14.2 04/23/2022 1331   HGB 14.3 05/21/2016 1128   HCT 42.1 04/23/2022 1331   PLT 307 04/23/2022 1331   MCV 88 04/23/2022 1331   MCH 29.8 04/23/2022 1331   MCH 29.1 05/07/2021 0446   MCHC 33.7 04/23/2022 1331   MCHC 33.3 05/07/2021 0446   RDW 12.8 04/23/2022 1331   Iron Studies    Component Value Date/Time   IRON 47 10/22/2022 1430   TIBC 323 10/22/2022 1430   FERRITIN 166 (H) 10/22/2022 1430   IRONPCTSAT  15 10/22/2022 1430   Lipid Panel     Component Value Date/Time   CHOL 162 01/12/2023 1213   TRIG 135 01/12/2023 1213   HDL 49 01/12/2023 1213   CHOLHDL 3.6 04/23/2022 1331   CHOLHDL 3.2 05/21/2016 1407   VLDL 44 (H) 05/21/2016 1407   LDLCALC 89 01/12/2023 1213   Hepatic Function Panel     Component Value Date/Time   PROT 7.4 09/16/2022 0933   ALBUMIN 4.8 09/16/2022 0933   AST 24 09/16/2022 0933   ALT 25 09/16/2022 0933   ALKPHOS 103 09/16/2022 0933   BILITOT 0.2 09/16/2022 0933      Component Value Date/Time   TSH 2.380 08/18/2022 1310   Nutritional Lab Results  Component Value Date   VD25OH 51.2 04/23/2022   VD25OH 39.4 05/27/2017   VD25OH 31 05/21/2016    Follow-Up   Return in about 4 weeks (around 06/11/2023) for For Weight Mangement with Dr. Rikki Spearing.Marland Kitchen She was informed of the importance of frequent follow up visits to maximize her success with intensive lifestyle modifications for her multiple health conditions.  Attestation Statement   Reviewed by clinician on day of visit: allergies, medications, problem list, medical history, surgical history, family history, social history, and previous encounter notes.     Worthy Rancher, MD

## 2023-05-27 DIAGNOSIS — M79644 Pain in right finger(s): Secondary | ICD-10-CM | POA: Diagnosis not present

## 2023-05-27 DIAGNOSIS — M25522 Pain in left elbow: Secondary | ICD-10-CM | POA: Diagnosis not present

## 2023-05-27 DIAGNOSIS — M79641 Pain in right hand: Secondary | ICD-10-CM | POA: Diagnosis not present

## 2023-05-29 DIAGNOSIS — M7041 Prepatellar bursitis, right knee: Secondary | ICD-10-CM | POA: Diagnosis not present

## 2023-05-29 DIAGNOSIS — M7051 Other bursitis of knee, right knee: Secondary | ICD-10-CM | POA: Diagnosis not present

## 2023-05-29 DIAGNOSIS — M7052 Other bursitis of knee, left knee: Secondary | ICD-10-CM | POA: Diagnosis not present

## 2023-05-29 DIAGNOSIS — M170 Bilateral primary osteoarthritis of knee: Secondary | ICD-10-CM | POA: Diagnosis not present

## 2023-06-08 DIAGNOSIS — G43719 Chronic migraine without aura, intractable, without status migrainosus: Secondary | ICD-10-CM | POA: Diagnosis not present

## 2023-06-08 DIAGNOSIS — G43019 Migraine without aura, intractable, without status migrainosus: Secondary | ICD-10-CM | POA: Diagnosis not present

## 2023-06-11 ENCOUNTER — Encounter (INDEPENDENT_AMBULATORY_CARE_PROVIDER_SITE_OTHER): Payer: Self-pay | Admitting: Internal Medicine

## 2023-06-11 ENCOUNTER — Ambulatory Visit (INDEPENDENT_AMBULATORY_CARE_PROVIDER_SITE_OTHER): Payer: PPO | Admitting: Internal Medicine

## 2023-06-11 VITALS — BP 119/82 | HR 85 | Temp 97.9°F | Ht 60.0 in | Wt 154.0 lb

## 2023-06-11 DIAGNOSIS — Z6831 Body mass index (BMI) 31.0-31.9, adult: Secondary | ICD-10-CM | POA: Diagnosis not present

## 2023-06-11 DIAGNOSIS — R7303 Prediabetes: Secondary | ICD-10-CM | POA: Diagnosis not present

## 2023-06-11 DIAGNOSIS — E66811 Obesity, class 1: Secondary | ICD-10-CM

## 2023-06-11 NOTE — Progress Notes (Addendum)
Office: [phone]  /  Fax: [phone]  Weight Summary And Biometrics  Vitals Temp: 97.9 F (36.6 C) BP: 119/82 Pulse Rate: 85 SpO2: 96 %   Anthropometric Measurements Height: 5' (1.524 m) Weight: 154 lb (69.9 kg) BMI (Calculated): 30.08 Weight at Last Visit: 152 lb Weight Lost Since Last Visit: 0 Weight Gained Since Last Visit: 2 lb Starting Weight: 159 lb Total Weight Loss (lbs): 5 lb (2.268 kg) Peak Weight: 172 lb   Body Composition  Body Fat %: 41.8 % Fat Mass (lbs): 64.6 lbs Muscle Mass (lbs): 85.2 lbs Total Body Water (lbs): 61.6 lbs Visceral Fat Rating : 12    No data recorded Today's Visit #: 11  Starting Date: 08/27/22   Subjective   Chief Complaint: Obesity  Carol [name] is here to discuss her progress with her obesity treatment plan. She is on the the Category 1 Plan and states she is following her eating plan approximately 75 % of the time. She states she is not exercising, not released from physical therapy yet.   Interval History:   Discussed the use of AI scribe software for clinical note transcription with the patient, who gave verbal consent to proceed.  History of Present Illness   Carol [name], a 72 year old individual with obesity and a history of prediabetes, presents for a medical weight management consultation. Since the last visit, she has gained two pounds. She recently experienced an injury, which has impacted her physical activity levels and overall recovery. She reports a lack of appetite suppressive effects from her current medication, Zonisamide, which she has been on for several years for migraines. She also takes Metformin to manage her prediabetes.  Carol [name] acknowledges the weight gain but expresses confidence in her ability to get back on track. She is considering the possibility of adding a low dose of Phentermine to her medication regimen in the future to aid in weight loss.  We also discussed other exercise options to increase  physical activity.  Despite the challenges, Carol [name] remains optimistic about her weight management journey and is open to exploring different treatment options and lifestyle changes to achieve her health goals.     Orexigenic Control:  Denies problems with appetite and hunger signals.  Denies problems with satiety and satiation.  Denies problems with eating patterns and portion control.  Denies abnormal cravings. Denies feeling deprived or restricted.   Barriers identified: orthopedic problems, medical conditions or chronic pain affecting mobility.   Pharmacotherapy for weight loss: She is currently taking Metformin (off label use for incretin effect and / or insulin resistance and / or diabetes prevention) with adequate clinical response  and without side effects. and zonisamide for migraine headaches  .   Assessment and Plan   Treatment Plan For Obesity:  Recommended Dietary Goals  Carol [name] is currently in the action stage of change. As such, her goal is to continue weight management plan. She has agreed to: continue current plan  Behavioral Intervention  We discussed the following Behavioral Modification Strategies today: continue to work on maintaining a reduced calorie state, getting the recommended amount of protein, incorporating whole foods, making healthy choices, staying well hydrated and practicing mindfulness when eating..  Additional resources provided today: None  Recommended Physical Activity Goals  Carol [name] has been advised to work up to 150 minutes of moderate intensity aerobic activity a week and strengthening exercises 2-3 times per week for cardiovascular health, weight loss maintenance and preservation of muscle mass.   She has agreed to :  Think  about enjoyable ways to increase daily physical activity and overcoming barriers to exercise and Increase physical activity in their day and reduce sedentary time (increase NEAT).  Pharmacotherapy  We discussed various  medication options to help Carol [name] with her weight loss efforts and we both agreed to :  She will continue metformin for diabetes prevention, zonisamide for migraines we will consider adding low-dose phentermine next.  Associated Conditions Addressed and Impacted by Obesity Treatment   Class 1 obesity with serious comorbidity and body mass index (BMI) of 31.0 to 31.9 in adult, unspecified obesity type Assessment & Plan: Carol [name] has done well maintaining her initial weight loss she has had some difficulties due to recent fall which is affected her physical activity levels.  But she remains committed and is planning to work at adjusting her nutritional intake, macro distribution in January.  We also discussed possibility of adding a sympathomimetic to current medical regimen.  She will continue with metformin, zonisamide is being prescribed by her neurologist for migraine and we reviewed that medication also helps with weight loss.   Prediabetes Assessment & Plan: Her A1c have been trending upward, so we had started her on metformin and she is taking XR twice a day without any adverse effects.  We will check hemoglobin A1c in February.  She will continue current dose.  She is also knowledgeable on the carb insulin model and is aware of maintaining a diet with a low glycemic load.  She will also work on increasing physical activity over the next month.       Objective   Physical Exam:  Blood pressure 119/82, pulse 85, temperature 97.9 F (36.6 C), height 5' (1.524 m), weight 154 lb (69.9 kg), last menstrual period 06/23/1990, SpO2 96%. Body mass index is 30.08 kg/m.  General: She is overweight, cooperative, alert, well developed, and in no acute distress. PSYCH: Has normal mood, affect and thought process.   HEENT: EOMI, sclerae are anicteric. Lungs: Normal breathing effort, no conversational dyspnea. Extremities: No edema.  Neurologic: No gross sensory or motor deficits. No tremors or  fasciculations noted.    Diagnostic Data Reviewed:  BMET    Component Value Date/Time   NA 142 09/16/2022 0933   K 4.5 09/16/2022 0933   CL 105 09/16/2022 0933   CO2 22 09/16/2022 0933   GLUCOSE 107 (H) 09/16/2022 0933   GLUCOSE 143 (H) 05/07/2021 0442   BUN 22 09/16/2022 0933   CREATININE 0.95 09/16/2022 0933   CREATININE 0.76 03/16/2017 1354   CALCIUM 9.5 09/16/2022 0933   GFRNONAA >60 05/07/2021 0442   GFRAA 79 10/21/2019 1326   Lab Results  Component Value Date   HGBA1C 6.1 (H) 01/12/2023   HGBA1C 5.6 02/22/2013   Lab Results  Component Value Date   INSULIN 11.1 09/16/2022   Lab Results  Component Value Date   TSH 2.380 08/18/2022   CBC    Component Value Date/Time   WBC 7.4 04/23/2022 1331   WBC 14.2 (H) 05/07/2021 0446   RBC 4.76 04/23/2022 1331   RBC 3.78 (L) 05/07/2021 0446   HGB 14.2 04/23/2022 1331   HGB 14.3 05/21/2016 1128   HCT 42.1 04/23/2022 1331   PLT 307 04/23/2022 1331   MCV 88 04/23/2022 1331   MCH 29.8 04/23/2022 1331   MCH 29.1 05/07/2021 0446   MCHC 33.7 04/23/2022 1331   MCHC 33.3 05/07/2021 0446   RDW 12.8 04/23/2022 1331   Iron Studies    Component Value Date/Time   IRON  47 10/22/2022 1430   TIBC 323 10/22/2022 1430   FERRITIN 166 (H) 10/22/2022 1430   IRONPCTSAT 15 10/22/2022 1430   Lipid Panel     Component Value Date/Time   CHOL 162 01/12/2023 1213   TRIG 135 01/12/2023 1213   HDL 49 01/12/2023 1213   CHOLHDL 3.6 04/23/2022 1331   CHOLHDL 3.2 05/21/2016 1407   VLDL 44 (H) 05/21/2016 1407   LDLCALC 89 01/12/2023 1213   Hepatic Function Panel     Component Value Date/Time   PROT 7.4 09/16/2022 0933   ALBUMIN 4.8 09/16/2022 0933   AST 24 09/16/2022 0933   ALT 25 09/16/2022 0933   ALKPHOS 103 09/16/2022 0933   BILITOT 0.2 09/16/2022 0933      Component Value Date/Time   TSH 2.380 08/18/2022 1310   Nutritional Lab Results  Component Value Date   VD25OH 51.2 04/23/2022   VD25OH 39.4 05/27/2017   VD25OH 31  05/21/2016    Follow-Up   Return in about 4 weeks (around 07/09/2023) for For Weight Mangement with Dr. Rikki Spearing.Marland Kitchen She was informed of the importance of frequent follow up visits to maximize her success with intensive lifestyle modifications for her multiple health conditions.  Attestation Statement   Reviewed by clinician on day of visit: allergies, medications, problem list, medical history, surgical history, family history, social history, and previous encounter notes.     Worthy Rancher, MD

## 2023-06-12 NOTE — Assessment & Plan Note (Signed)
Carol [name] has done well maintaining her initial weight loss she has had some difficulties due to recent fall which is affected her physical activity levels.  But she remains committed and is planning to work at adjusting her nutritional intake, macro distribution in January.  We also discussed possibility of adding a sympathomimetic to current medical regimen.  She will continue with metformin, zonisamide is being prescribed by her neurologist for migraine and we reviewed that medication also helps with weight loss.

## 2023-06-12 NOTE — Assessment & Plan Note (Signed)
Her A1c have been trending upward, so we had started her on metformin and she is taking XR twice a day without any adverse effects.  We will check hemoglobin A1c in February.  She will continue current dose.  She is also knowledgeable on the carb insulin model and is aware of maintaining a diet with a low glycemic load.  She will also work on increasing physical activity over the next month.

## 2023-06-12 NOTE — Addendum Note (Signed)
Addended by: Lyda Kalata on: 06/12/2023 11:52 AM   Modules accepted: Level of Service

## 2023-06-19 DIAGNOSIS — M79644 Pain in right finger(s): Secondary | ICD-10-CM | POA: Diagnosis not present

## 2023-06-19 DIAGNOSIS — M25522 Pain in left elbow: Secondary | ICD-10-CM | POA: Diagnosis not present

## 2023-06-22 DIAGNOSIS — H66002 Acute suppurative otitis media without spontaneous rupture of ear drum, left ear: Secondary | ICD-10-CM | POA: Diagnosis not present

## 2023-06-22 DIAGNOSIS — Z6829 Body mass index (BMI) 29.0-29.9, adult: Secondary | ICD-10-CM | POA: Diagnosis not present

## 2023-07-01 DIAGNOSIS — M25562 Pain in left knee: Secondary | ICD-10-CM | POA: Diagnosis not present

## 2023-07-01 DIAGNOSIS — M25561 Pain in right knee: Secondary | ICD-10-CM | POA: Diagnosis not present

## 2023-07-03 ENCOUNTER — Ambulatory Visit
Admission: RE | Admit: 2023-07-03 | Discharge: 2023-07-03 | Disposition: A | Discharge status: Home / Self Care | Payer: PPO | Source: Ambulatory Visit | Attending: Family Medicine | Admitting: Family Medicine

## 2023-07-03 DIAGNOSIS — R928 Other abnormal and inconclusive findings on diagnostic imaging of breast: Secondary | ICD-10-CM | POA: Diagnosis not present

## 2023-07-03 DIAGNOSIS — N6310 Unspecified lump in the right breast, unspecified quadrant: Secondary | ICD-10-CM

## 2023-07-08 DIAGNOSIS — M25561 Pain in right knee: Secondary | ICD-10-CM | POA: Diagnosis not present

## 2023-07-08 DIAGNOSIS — M25562 Pain in left knee: Secondary | ICD-10-CM | POA: Diagnosis not present

## 2023-07-14 DIAGNOSIS — M25562 Pain in left knee: Secondary | ICD-10-CM | POA: Diagnosis not present

## 2023-07-14 DIAGNOSIS — M25561 Pain in right knee: Secondary | ICD-10-CM | POA: Diagnosis not present

## 2023-07-15 ENCOUNTER — Ambulatory Visit (INDEPENDENT_AMBULATORY_CARE_PROVIDER_SITE_OTHER): Payer: PPO | Admitting: Internal Medicine

## 2023-07-16 DIAGNOSIS — M25522 Pain in left elbow: Secondary | ICD-10-CM | POA: Diagnosis not present

## 2023-07-17 DIAGNOSIS — D2361 Other benign neoplasm of skin of right upper limb, including shoulder: Secondary | ICD-10-CM | POA: Diagnosis not present

## 2023-07-17 DIAGNOSIS — L570 Actinic keratosis: Secondary | ICD-10-CM | POA: Diagnosis not present

## 2023-07-17 DIAGNOSIS — D2271 Melanocytic nevi of right lower limb, including hip: Secondary | ICD-10-CM | POA: Diagnosis not present

## 2023-07-17 DIAGNOSIS — Z1283 Encounter for screening for malignant neoplasm of skin: Secondary | ICD-10-CM | POA: Diagnosis not present

## 2023-07-17 DIAGNOSIS — M25562 Pain in left knee: Secondary | ICD-10-CM | POA: Diagnosis not present

## 2023-07-17 DIAGNOSIS — D485 Neoplasm of uncertain behavior of skin: Secondary | ICD-10-CM | POA: Diagnosis not present

## 2023-07-17 DIAGNOSIS — D225 Melanocytic nevi of trunk: Secondary | ICD-10-CM | POA: Diagnosis not present

## 2023-07-17 DIAGNOSIS — X32XXXD Exposure to sunlight, subsequent encounter: Secondary | ICD-10-CM | POA: Diagnosis not present

## 2023-07-17 DIAGNOSIS — M25561 Pain in right knee: Secondary | ICD-10-CM | POA: Diagnosis not present

## 2023-07-20 ENCOUNTER — Other Ambulatory Visit: Payer: Self-pay | Admitting: Family Medicine

## 2023-07-20 DIAGNOSIS — K219 Gastro-esophageal reflux disease without esophagitis: Secondary | ICD-10-CM

## 2023-07-22 DIAGNOSIS — M25562 Pain in left knee: Secondary | ICD-10-CM | POA: Diagnosis not present

## 2023-07-22 DIAGNOSIS — M25561 Pain in right knee: Secondary | ICD-10-CM | POA: Diagnosis not present

## 2023-07-23 ENCOUNTER — Ambulatory Visit (INDEPENDENT_AMBULATORY_CARE_PROVIDER_SITE_OTHER): Payer: PPO | Admitting: Family Medicine

## 2023-07-23 ENCOUNTER — Encounter: Payer: Self-pay | Admitting: Family Medicine

## 2023-07-23 VITALS — BP 116/78 | HR 73 | Ht 60.25 in | Wt 159.2 lb

## 2023-07-23 DIAGNOSIS — R252 Cramp and spasm: Secondary | ICD-10-CM | POA: Diagnosis not present

## 2023-07-23 DIAGNOSIS — R7303 Prediabetes: Secondary | ICD-10-CM | POA: Diagnosis not present

## 2023-07-23 DIAGNOSIS — J4520 Mild intermittent asthma, uncomplicated: Secondary | ICD-10-CM

## 2023-07-23 DIAGNOSIS — E785 Hyperlipidemia, unspecified: Secondary | ICD-10-CM

## 2023-07-23 DIAGNOSIS — E66811 Obesity, class 1: Secondary | ICD-10-CM

## 2023-07-23 DIAGNOSIS — N951 Menopausal and female climacteric states: Secondary | ICD-10-CM | POA: Diagnosis not present

## 2023-07-23 DIAGNOSIS — M810 Age-related osteoporosis without current pathological fracture: Secondary | ICD-10-CM

## 2023-07-23 DIAGNOSIS — B009 Herpesviral infection, unspecified: Secondary | ICD-10-CM | POA: Diagnosis not present

## 2023-07-23 DIAGNOSIS — Z0000 Encounter for general adult medical examination without abnormal findings: Secondary | ICD-10-CM

## 2023-07-23 DIAGNOSIS — D696 Thrombocytopenia, unspecified: Secondary | ICD-10-CM

## 2023-07-23 DIAGNOSIS — R251 Tremor, unspecified: Secondary | ICD-10-CM

## 2023-07-23 DIAGNOSIS — Z6831 Body mass index (BMI) 31.0-31.9, adult: Secondary | ICD-10-CM

## 2023-07-23 DIAGNOSIS — Z860100 Personal history of colon polyps, unspecified: Secondary | ICD-10-CM

## 2023-07-23 DIAGNOSIS — E6609 Other obesity due to excess calories: Secondary | ICD-10-CM

## 2023-07-23 DIAGNOSIS — Z87442 Personal history of urinary calculi: Secondary | ICD-10-CM

## 2023-07-23 DIAGNOSIS — Z86718 Personal history of other venous thrombosis and embolism: Secondary | ICD-10-CM

## 2023-07-23 DIAGNOSIS — K219 Gastro-esophageal reflux disease without esophagitis: Secondary | ICD-10-CM

## 2023-07-23 DIAGNOSIS — M8580 Other specified disorders of bone density and structure, unspecified site: Secondary | ICD-10-CM

## 2023-07-23 LAB — POCT GLYCOSYLATED HEMOGLOBIN (HGB A1C): Hemoglobin A1C: 5.8 % — AB (ref 4.0–5.6)

## 2023-07-23 MED ORDER — VALACYCLOVIR HCL 500 MG PO TABS: ORAL_TABLET | ORAL | 3 refills | Status: AC

## 2023-07-23 MED ORDER — ALBUTEROL SULFATE HFA 108 (90 BASE) MCG/ACT IN AERS: INHALATION_SPRAY | RESPIRATORY_TRACT | 0 refills | Status: AC

## 2023-07-23 MED ORDER — ATORVASTATIN CALCIUM 20 MG PO TABS: 20.0000 mg | ORAL_TABLET | Freq: Every day | ORAL | 0 refills | Status: DC

## 2023-07-23 MED ORDER — PANTOPRAZOLE SODIUM 40 MG PO TBEC: 40.0000 mg | DELAYED_RELEASE_TABLET | Freq: Every day | ORAL | 3 refills | Status: DC

## 2023-07-23 NOTE — Progress Notes (Signed)
Complete physical exam  Patient: Carol [name]   DOB: June 09, 1951   73 y.o. Female  MRN: [id number]  Subjective:    Chief Complaint  Patient presents with   Annual Exam    Fasting.    Medication Management    Would like a statin that allows her to eat grapefruit     Carol [name] is a 73 y.o. female who presents today for a complete physical exam. She reports consuming a  less carbs, more vegetables, and under 1200 calories a day  diet. Home exercise routine includes at home work out videos. She generally feels well. She reports sleeping poorly. She does not have additional problems to discuss today.  She [facility].  They did place her on metformin recognizing that she was glucose intolerant.  She has been involved in rehab after some trauma injuring various body parts.  She is slowly recovering from that.  Continues on Boniva.  She does go to [facility] and has lost some weight by going there but had a problem sticking to it when she was doing her physical therapy.  She is scheduled to see Dr. Loreta Ave for follow-up on her colonic polyps.  She states that the tremor is not causing much trouble.  She does have hot flashes but is not on any medication nor does she want any.  She has albuterol but rarely uses it.  She does have a previous history of DVT as well as stones.  She has not had stones in quite some time.  The muscle cramps of also not been much of an issue recently. She would like to continue on Valtrex.  Most recent fall risk assessment:    07/23/2023   10:42 AM  Fall Risk   Falls in the past year? 1  Number falls in past yr: 1  Injury with Fall? 1  Comment currently in physical therapy     Most recent depression screenings:    07/23/2023   10:44 AM 10/08/2022    1:55 PM  PHQ 2/9 Scores  PHQ - 2 Score 0 0    Vision:Within last year and Dental: No current dental problems and Last dental visit: December  2024    Patient Care Team: Ronnald Nian, MD as PCP - General (Family Medicine)   Outpatient Medications Prior to Visit  Medication Sig   Apoaequorin (PREVAGEN) 10 MG CAPS Take by mouth in the morning.   azithromycin (ZITHROMAX) 500 MG tablet Take 1 tablet (500 mg total) by mouth daily.   diclofenac sodium (VOLTAREN) 1 % GEL Apply 2 g topically 4 (four) times daily.   DOXYCYCLINE HYCLATE PO Take 100 mg by mouth as needed.   ibandronate (BONIVA) 150 MG tablet Take in the morning with a full glass of water once a month, on an empty stomach, do not take anything else by mouth or lie down for the next 30 min.   metFORMIN (GLUCOPHAGE-XR) 500 MG 24 hr tablet Take 1 tablet (500 mg total) by mouth 2 (two) times daily with a meal.   methocarbamol (ROBAXIN) 500 MG tablet Take 500 mg by mouth daily as needed (migraine).   Multiple Vitamins-Minerals (CENTRUM SILVER 50+WOMEN PO) Take by mouth in the morning.   NON FORMULARY Viviscal   zonisamide (ZONEGRAN) 100 MG capsule Take 100 mg by mouth 2 (two) times daily.   [DISCONTINUED] albuterol (PROAIR HFA) 108 (90 Base) MCG/ACT inhaler INHALE 2 PUFFS INTO  THE LUNGS EVERY 6 HOURS AS NEEDED FOR WHEEZING/ SHORTNESS OF BREATH.   [DISCONTINUED] atorvastatin (LIPITOR) 20 MG tablet Take 1 tablet (20 mg total) by mouth daily.   [DISCONTINUED] pantoprazole (PROTONIX) 40 MG tablet Take 1 tablet (40 mg total) by mouth daily.   [DISCONTINUED] valACYclovir (VALTREX) 500 MG tablet Take 1 tablet (500 mg total) by mouth daily.   acetaminophen (TYLENOL) 500 MG tablet Take 1 tablet (500 mg total) by mouth every 6 (six) hours as needed.   Ubrogepant (UBRELVY) 50 MG TABS Take 50 mg by mouth as needed.   No facility-administered medications prior to visit.    Review of Systems  All other systems reviewed and are negative. Otherwise family and social history as well as health maintenance and immunizations was reviewed        Objective:     BP 116/78   Pulse 73    Ht 5' 0.25" (1.53 m)   Wt 159 lb 3.2 oz (72.2 kg)   LMP 06/23/1990 (Approximate)   SpO2 96%   BMI 30.83 kg/m    Physical Exam   Alert and in no distress. Tympanic membranes and canals are normal. Pharyngeal area is normal. Neck is supple without adenopathy or thyromegaly. Cardiac exam shows a regular sinus rhythm without murmurs or gallops. Lungs are clear to auscultation. Hemoglobin A1c is 5.8     Assessment & Plan:     Routine general medical examination at a health care facility  Asthma in adult, mild intermittent, uncomplicated - Plan: albuterol (PROAIR HFA) 108 (90 Base) MCG/ACT inhaler  Class 1 obesity due to excess calories with serious comorbidity and body mass index (BMI) of 31.0 to 31.9 in adult  Gastroesophageal reflux disease without esophagitis - Plan: CBC with Differential/Platelet, Comprehensive metabolic panel, pantoprazole (PROTONIX) 40 MG tablet  History of colonic polyps  History of DVT of lower extremity  History of renal stone - Plan: Comprehensive metabolic panel  Hyperlipidemia with target LDL less than 100 - Plan: Lipid panel, atorvastatin (LIPITOR) 20 MG tablet  Nocturnal muscle cramps  Thrombocytopenia (HCC) - Plan: CBC with Differential/Platelet  Tremor  Hot flashes, menopausal  Prediabetes - Plan: POCT glycosylated hemoglobin (Hb A1C)  HSV-2 infection - Plan: valACYclovir (VALTREX) 500 MG tablet  Age-related osteoporosis without current pathological fracture - Plan: DG Bone Density  Immunization History  Administered Date(s) Administered   Fluad Quad(high Dose 65+) 03/13/2019, 02/28/2020, 03/07/2021, 03/04/2022   Fluad Trivalent(High Dose 65+) 03/06/2023   Influenza Whole 04/19/2007, 09/04/2008   Influenza-Unspecified 03/23/2013, 03/14/2016, 04/12/2017, 03/13/2019   PFIZER Comirnaty(Gray Top)Covid-19 Tri-Sucrose Vaccine 10/18/2020   PFIZER(Purple Top)SARS-COV-2 Vaccination 07/31/2019, 08/24/2019, 04/04/2020   Pfizer Covid-19 Vaccine  Bivalent Booster 75yrs & up 04/09/2021   Pfizer(Comirnaty)Fall Seasonal Vaccine 12 years and older 04/11/2022, 03/06/2023   Pneumococcal Conjugate-13 12/14/2018   Pneumococcal Polysaccharide-23 12/22/2019   Td 12/13/2001   Tdap 06/23/2008, 12/14/2018   Zoster Recombinant(Shingrix) 06/17/2017, 08/24/2017   Zoster, Live 06/01/2015    Health Maintenance  Topic Date Due   COVID-19 Vaccine (7 - 2024-25 season) 05/01/2023   Medicare Annual Wellness (AWV)  08/06/2023   MAMMOGRAM  07/02/2024   DTaP/Tdap/Td (4 - Td or Tdap) 12/13/2028   Colonoscopy  06/27/2030   Pneumonia Vaccine 56+ Years old  Completed   INFLUENZA VACCINE  Completed   DEXA SCAN  Completed   Hepatitis C Screening  Completed   Zoster Vaccines- Shingrix  Completed   HPV VACCINES  Aged Out    Discussed continue with physical activity as  tolerated need to her previous history of trauma.  Continue on her present medication regimen.  Discussed calling me if she has kidney stone issues to get on medication and then appropriate x-ray follow-up.  Continue with medical weight loss and wellness. Problem List Items Addressed This Visit     Asthma in adult, mild intermittent, uncomplicated   Relevant Medications   albuterol (PROAIR HFA) 108 (90 Base) MCG/ACT inhaler   Class 1 obesity with serious comorbidity and body mass index (BMI) of 31.0 to 31.9 in adult   GERD (gastroesophageal reflux disease)   Relevant Medications   pantoprazole (PROTONIX) 40 MG tablet   Other Relevant Orders   CBC with Differential/Platelet   Comprehensive metabolic panel   History of colonic polyps   History of DVT of lower extremity   History of renal stone   Relevant Orders   Comprehensive metabolic panel   Hot flashes, menopausal   Relevant Medications   atorvastatin (LIPITOR) 20 MG tablet   Hyperlipidemia with target LDL less than 100   Relevant Medications   atorvastatin (LIPITOR) 20 MG tablet   Other Relevant Orders   Lipid panel    Nocturnal muscle cramps   Prediabetes   Relevant Orders   POCT glycosylated hemoglobin (Hb A1C)   Thrombocytopenia (HCC)   Relevant Orders   CBC with Differential/Platelet   Tremor   Other Visit Diagnoses       Routine general medical examination at a health care facility    -  Primary     HSV-2 infection       Relevant Medications   valACYclovir (VALTREX) 500 MG tablet     Age-related osteoporosis without current pathological fracture       Relevant Orders   DG Bone Density      Follow-up here in 1 year.    Sharlot Gowda, MD

## 2023-07-24 ENCOUNTER — Encounter: Payer: Self-pay | Admitting: Family Medicine

## 2023-07-24 DIAGNOSIS — M25562 Pain in left knee: Secondary | ICD-10-CM | POA: Diagnosis not present

## 2023-07-24 DIAGNOSIS — M25561 Pain in right knee: Secondary | ICD-10-CM | POA: Diagnosis not present

## 2023-07-24 LAB — LIPID PANEL
Chol/HDL Ratio: 3.4 {ratio} (ref 0.0–4.4)
Cholesterol, Total: 190 mg/dL (ref 100–199)
HDL: 56 mg/dL (ref >39)
LDL Chol Calc (NIH): 106 mg/dL — ABNORMAL HIGH (ref 0–99)
Triglycerides: 163 mg/dL — ABNORMAL HIGH (ref 0–149)
VLDL Cholesterol Cal: 28 mg/dL (ref 5–40)

## 2023-07-24 LAB — CBC WITH DIFFERENTIAL/PLATELET
Basophils Absolute: 0.1 10*3/uL (ref 0.0–0.2)
Basos: 1 %
EOS (ABSOLUTE): 0.3 10*3/uL (ref 0.0–0.4)
Eos: 4 %
Hematocrit: 44.1 % (ref 34.0–46.6)
Hemoglobin: 14.3 g/dL (ref 11.1–15.9)
Immature Grans (Abs): 0.1 10*3/uL (ref 0.0–0.1)
Immature Granulocytes: 1 %
Lymphocytes Absolute: 2 10*3/uL (ref 0.7–3.1)
Lymphs: 23 %
MCH: 28 pg (ref 26.6–33.0)
MCHC: 32.4 g/dL (ref 31.5–35.7)
MCV: 86 fL (ref 79–97)
Monocytes Absolute: 0.5 10*3/uL (ref 0.1–0.9)
Monocytes: 6 %
Neutrophils Absolute: 5.7 10*3/uL (ref 1.4–7.0)
Neutrophils: 65 %
Platelets: 313 10*3/uL (ref 150–450)
RBC: 5.11 x10E6/uL (ref 3.77–5.28)
RDW: 13.6 % (ref 11.7–15.4)
WBC: 8.7 10*3/uL (ref 3.4–10.8)

## 2023-07-24 LAB — COMPREHENSIVE METABOLIC PANEL
ALT: 19 [IU]/L (ref 0–32)
AST: 23 [IU]/L (ref 0–40)
Albumin: 4.5 g/dL (ref 3.9–4.9)
Alkaline Phosphatase: 85 [IU]/L (ref 44–121)
BUN/Creatinine Ratio: 16 (ref 12–28)
BUN: 16 mg/dL (ref 8–27)
Bilirubin Total: 0.2 mg/dL (ref 0.0–1.2)
CO2: 20 mmol/L (ref 20–29)
Calcium: 8.7 mg/dL (ref 8.7–10.3)
Chloride: 108 mmol/L — ABNORMAL HIGH (ref 96–106)
Creatinine, Ser: 0.97 mg/dL (ref 0.57–1.00)
Globulin, Total: 2.3 g/dL (ref 1.5–4.5)
Glucose: 100 mg/dL — ABNORMAL HIGH (ref 70–99)
Potassium: 4.7 mmol/L (ref 3.5–5.2)
Sodium: 141 mmol/L (ref 134–144)
Total Protein: 6.8 g/dL (ref 6.0–8.5)
eGFR: 63 mL/min/{1.73_m2} (ref >59)

## 2023-07-24 MED ORDER — ATORVASTATIN CALCIUM 40 MG PO TABS: 40.0000 mg | ORAL_TABLET | Freq: Every day | ORAL | 3 refills | Status: AC

## 2023-07-24 NOTE — Addendum Note (Signed)
Addended by: Ronnald Nian on: 07/24/2023 05:01 AM   Modules accepted: Orders

## 2023-07-29 DIAGNOSIS — M25561 Pain in right knee: Secondary | ICD-10-CM | POA: Diagnosis not present

## 2023-07-29 DIAGNOSIS — M25562 Pain in left knee: Secondary | ICD-10-CM | POA: Diagnosis not present

## 2023-08-06 ENCOUNTER — Encounter (INDEPENDENT_AMBULATORY_CARE_PROVIDER_SITE_OTHER): Payer: Self-pay | Admitting: Internal Medicine

## 2023-08-06 ENCOUNTER — Ambulatory Visit (INDEPENDENT_AMBULATORY_CARE_PROVIDER_SITE_OTHER): Payer: PPO | Admitting: Internal Medicine

## 2023-08-06 VITALS — BP 116/81 | HR 76 | Temp 97.5°F | Ht 60.0 in | Wt 156.0 lb

## 2023-08-06 DIAGNOSIS — E66811 Obesity, class 1: Secondary | ICD-10-CM | POA: Diagnosis not present

## 2023-08-06 DIAGNOSIS — Z6831 Body mass index (BMI) 31.0-31.9, adult: Secondary | ICD-10-CM | POA: Diagnosis not present

## 2023-08-06 DIAGNOSIS — R7303 Prediabetes: Secondary | ICD-10-CM

## 2023-08-06 MED ORDER — PHENTERMINE HCL 37.5 MG PO TABS: 18.7500 mg | ORAL_TABLET | Freq: Every day | ORAL | 0 refills | Status: DC

## 2023-08-06 MED ORDER — METFORMIN HCL ER 500 MG PO TB24: 500.0000 mg | ORAL_TABLET | Freq: Two times a day (BID) | ORAL | 0 refills | Status: DC

## 2023-08-06 NOTE — Assessment & Plan Note (Signed)
See obesity treatment plan - Continue with current weight management strategy. - Start Adipex 18.75 mg in the morning on an empty stomach. - Discussed adjustments to overcome identified barriers. - Reinforce dietary and exercise goals. - Follow-up in 4 weeks.

## 2023-08-06 NOTE — Assessment & Plan Note (Signed)
  Most recent A1c was 5.8 and stable.  She is currently on metformin XR 500 mg twice a day and will continue medication for pharmacoprophylaxis.  She would also continue with current weight management strategy.

## 2023-08-06 NOTE — Progress Notes (Signed)
Office: [phone]  /  Fax: [phone]  Weight Summary And Biometrics  Vitals Temp: (!) 97.5 F (36.4 C) BP: 116/81 Pulse Rate: 76 SpO2: 95 %   Anthropometric Measurements Height: 5' (1.524 m) Weight: 156 lb (70.8 kg) BMI (Calculated): 30.47 Weight at Last Visit: 154 lb Weight Lost Since Last Visit: 0 lb Weight Gained Since Last Visit: 2 lb Starting Weight: 159 lb Total Weight Loss (lbs): 3 lb (1.361 kg) Peak Weight: 175 lb   Body Composition  Body Fat %: 41.6 % Fat Mass (lbs): 65 lbs Muscle Mass (lbs): 86.6 lbs Total Body Water (lbs): 58 lbs Visceral Fat Rating : 12    No data recorded Today's Visit #: 12  Starting Date: 08/27/22   Subjective   Chief Complaint: Obesity  Carol [name] is here to discuss her progress with her obesity treatment plan. She is on the the Category 1 Plan and states she is following her eating plan approximately 75 % of the time. She states she is going to PT 60 minutes 5 times per week.  Weight Progress Since Last Visit:  Discussed the use of AI scribe software for clinical note transcription with the patient, who gave verbal consent to proceed.  History of Present Illness   Carol [name] "Carol [name]" is a 73 year old female with obesity and prediabetes who presents for medical weight management.  She follows a reduced calorie nutrition plan 75% of the time and attends physical therapy sessions for 60 minutes, five times per week. Despite these efforts, she has gained two pounds since her last office visit.  She has experienced difficulty losing weight following a fall at the end of last year. Initially, she was able to reduce her weight from 166 pounds to 151 pounds after starting the program, but experienced weight regain after the holidays. Her muscle mass has been increasing, likely due to her physical therapy regimen, but her body fat percentage remains unchanged.  She is undergoing physical therapy, focusing on  strengthening her knees and hip flexors, which includes exercises like leg presses. She attends these sessions twice a week for an hour each. She feels stronger and steadier when walking, although she experiences anxiety about walking due to fear of falling again, describing it as 'PTSD about walking'.      Challenges affecting patient progress: orthopedic problems, medical conditions or chronic pain affecting mobility and weight loss plateau .   Orexigenic Control: Reports problems with appetite and hunger signals.  Denies problems with satiety and satiation.     Pharmacotherapy for weight management: She is currently taking Metformin (off label use for incretin effect and / or insulin resistance and / or diabetes prevention) with adequate clinical response  and without side effects..   Assessment and Plan   Treatment Plan For Obesity:  Recommended Dietary Goals  Carol [name] is currently in the action stage of change. As such, her goal is to continue weight management plan. She has agreed to: continue current plan  Behavioral Health and Counseling  We discussed the following behavioral modification strategies today: continue to work on maintaining a reduced calorie state, getting the recommended amount of protein, incorporating whole foods, making healthy choices, staying well hydrated and practicing mindfulness when eating..  Additional education and resources provided today: None  Recommended Physical Activity Goals  Carol [name] has been advised to work up to 150 minutes of moderate intensity aerobic activity a week and strengthening exercises 2-3 times per week for cardiovascular health, weight loss maintenance and  preservation of muscle mass.   She has agreed to :  continue to gradually increase the amount and intensity of exercise routine  Pharmacotherapy  We discussed various medication options to help Carol [name] with her weight loss efforts and we both agreed to : start anti-obesity  medication.  In addition to reduced calorie nutrition plan (RCNP), behavioral strategies and physical activity, Carol [name] would benefit from pharmacotherapy to assist with hunger signals, satiety and cravings. This will reduce obesity-related health risks by inducing weight loss, and help reduce food consumption and adherence to [facility]) . It may also improve QOL by improving self-confidence and reduce the  setbacks associated with metabolic adaptations.  After discussion of treatment options, mechanisms of action, benefits, side effects, contraindications and shared decision making she is agreeable to starting phentermine 18.75 mg in the morning. Patient also made aware that medication is indicated for long-term management of obesity and the risk of weight regain following discontinuation of treatment and hence the importance of adhering to medical weight loss plan.    We reviewed state registry no controlled substances found.  Consent was reviewed and signed.  Reviewed medical history no contraindications.  Associated Conditions Impacted by Obesity Treatment  Class 1 obesity with serious comorbidity and body mass index (BMI) of 31.0 to 31.9 in adult, unspecified obesity type Assessment & Plan: See obesity treatment plan - Continue with current weight management strategy. - Start Adipex 18.75 mg in the morning on an empty stomach. - Discussed adjustments to overcome identified barriers. - Reinforce dietary and exercise goals. - Follow-up in 4 weeks.    Prediabetes Assessment & Plan:  Most recent A1c was 5.8 and stable.  She is currently on metformin XR 500 mg twice a day and will continue medication for pharmacoprophylaxis.  She would also continue with current weight management strategy.  Orders: -     metFORMIN HCl ER; Take 1 tablet (500 mg total) by mouth 2 (two) times daily with a meal.  Dispense: 180 tablet; Refill: 0  Other orders -     Phentermine HCl; Take 0.5 tablets (18.75 mg total)  by mouth daily before breakfast.  Dispense: 15 tablet; Refill: 0     Objective   Physical Exam:  Blood pressure 116/81, pulse 76, temperature (!) 97.5 F (36.4 C), height 5' (1.524 m), weight 156 lb (70.8 kg), last menstrual period 06/23/1990, SpO2 95%. Body mass index is 30.47 kg/m.  General: She is overweight, cooperative, alert, well developed, and in no acute distress. PSYCH: Has normal mood, affect and thought process.   HEENT: EOMI, sclerae are anicteric. Lungs: Normal breathing effort, no conversational dyspnea. Extremities: No edema.  Neurologic: No gross sensory or motor deficits. No tremors or fasciculations noted.    Diagnostic Data Reviewed:  BMET    Component Value Date/Time   NA 141 07/23/2023 1133   K 4.7 07/23/2023 1133   CL 108 (H) 07/23/2023 1133   CO2 20 07/23/2023 1133   GLUCOSE 100 (H) 07/23/2023 1133   GLUCOSE 143 (H) 05/07/2021 0442   BUN 16 07/23/2023 1133   CREATININE 0.97 07/23/2023 1133   CREATININE 0.76 03/16/2017 1354   CALCIUM 8.7 07/23/2023 1133   GFRNONAA >60 05/07/2021 0442   GFRAA 79 10/21/2019 1326   Lab Results  Component Value Date   HGBA1C 5.8 (A) 07/23/2023   HGBA1C 5.6 02/22/2013   Lab Results  Component Value Date   INSULIN 11.1 09/16/2022   Lab Results  Component Value Date   TSH 2.380 08/18/2022  CBC    Component Value Date/Time   WBC 8.7 07/23/2023 1133   WBC 14.2 (H) 05/07/2021 0446   RBC 5.11 07/23/2023 1133   RBC 3.78 (L) 05/07/2021 0446   HGB 14.3 07/23/2023 1133   HGB 14.3 05/21/2016 1128   HCT 44.1 07/23/2023 1133   PLT 313 07/23/2023 1133   MCV 86 07/23/2023 1133   MCH 28.0 07/23/2023 1133   MCH 29.1 05/07/2021 0446   MCHC 32.4 07/23/2023 1133   MCHC 33.3 05/07/2021 0446   RDW 13.6 07/23/2023 1133   Iron Studies    Component Value Date/Time   IRON 47 10/22/2022 1430   TIBC 323 10/22/2022 1430   FERRITIN 166 (H) 10/22/2022 1430   IRONPCTSAT 15 10/22/2022 1430   Lipid Panel     Component  Value Date/Time   CHOL 190 07/23/2023 1133   TRIG 163 (H) 07/23/2023 1133   HDL 56 07/23/2023 1133   CHOLHDL 3.4 07/23/2023 1133   CHOLHDL 3.2 05/21/2016 1407   VLDL 44 (H) 05/21/2016 1407   LDLCALC 106 (H) 07/23/2023 1133   Hepatic Function Panel     Component Value Date/Time   PROT 6.8 07/23/2023 1133   ALBUMIN 4.5 07/23/2023 1133   AST 23 07/23/2023 1133   ALT 19 07/23/2023 1133   ALKPHOS 85 07/23/2023 1133   BILITOT 0.2 07/23/2023 1133      Component Value Date/Time   TSH 2.380 08/18/2022 1310   Nutritional Lab Results  Component Value Date   VD25OH 51.2 04/23/2022   VD25OH 39.4 05/27/2017   VD25OH 31 05/21/2016    Follow-Up   Return in about 4 weeks (around 09/03/2023) for For Weight Mangement with Dr. Rikki Spearing.Marland Kitchen She was informed of the importance of frequent follow up visits to maximize her success with intensive lifestyle modifications for her multiple health conditions.  Attestation Statement   Reviewed by clinician on day of visit: allergies, medications, problem list, medical history, surgical history, family history, social history, and previous encounter notes.     Worthy Rancher, MD

## 2023-08-11 ENCOUNTER — Ambulatory Visit (INDEPENDENT_AMBULATORY_CARE_PROVIDER_SITE_OTHER): Payer: PPO

## 2023-08-11 VITALS — BP 122/60 | HR 91 | Temp 97.5°F | Ht 60.0 in | Wt 160.0 lb

## 2023-08-11 DIAGNOSIS — Z0000 Encounter for general adult medical examination without abnormal findings: Secondary | ICD-10-CM

## 2023-08-11 NOTE — Patient Instructions (Signed)
[name] , Thank you for taking time to come for your Medicare Wellness Visit. I appreciate your ongoing commitment to your health goals. Please review the following plan we discussed and let me know if I can assist you in the future.   Referrals/Orders/Follow-Ups/Clinician Recommendations: none  This is a list of the screening recommended for you and due dates:  Health Maintenance  Topic Date Due   COVID-19 Vaccine (7 - 2024-25 season) 05/01/2023   Mammogram  07/02/2024   Medicare Annual Wellness Visit  08/10/2024   DTaP/Tdap/Td vaccine (4 - Td or Tdap) 12/13/2028   Colon Cancer Screening  06/27/2030   Pneumonia Vaccine  Completed   Flu Shot  Completed   DEXA scan (bone density measurement)  Completed   Hepatitis C Screening  Completed   Zoster (Shingles) Vaccine  Completed   HPV Vaccine  Aged Out    Advanced directives: (Copy Requested) Please bring a copy of your health care power of attorney and living will to the office to be added to your chart at your convenience.  Next Medicare Annual Wellness Visit scheduled for next year: Yes  insert Preventive Care attachment Insert FALL PREVENTION attachment if needed

## 2023-08-11 NOTE — Progress Notes (Signed)
Subjective:   Carol [name] is a 73 y.o. female who presents for Medicare Annual (Subsequent) preventive examination.  Visit Complete: In person    Cardiac Risk Factors include: advanced age (>45men, >90 women);dyslipidemia     Objective:    Today's Vitals   08/11/23 0955 08/11/23 0956  BP: 122/60   Pulse: 91   Temp: (!) 97.5 F (36.4 C)   TempSrc: Oral   SpO2: 93%   Weight: 160 lb (72.6 kg)   Height: 5' (1.524 m)   PainSc:  2    Body mass index is 31.25 kg/m.     08/11/2023   10:06 AM 10/01/2022   11:59 AM 08/05/2022   10:34 AM 01/07/2022   10:21 AM 08/02/2021   10:46 AM 05/02/2021    6:39 PM 05/02/2021    2:37 PM  Advanced Directives  Does Patient Have a Medical Advance Directive? Yes Yes Yes Yes Yes Yes Yes  Type of Estate agent of [place];Living will Healthcare Power of [employer] of [place];Living will Living will;Healthcare Power of [employer] Power of [place];Living will Healthcare Power of [place];Living will Healthcare Power of [place];Living will  Does patient want to make changes to medical advance directive?    No - Patient declined  No - Patient declined No - Patient declined  Copy of Healthcare Power of Attorney in Chart? No - copy requested  No - copy requested  No - copy requested No - copy requested No - copy requested    Current Medications (verified) Outpatient Encounter Medications as of 08/11/2023  Medication Sig   albuterol (PROAIR HFA) 108 (90 Base) MCG/ACT inhaler INHALE 2 PUFFS INTO THE LUNGS EVERY 6 HOURS AS NEEDED FOR WHEEZING/ SHORTNESS OF BREATH.   Apoaequorin (PREVAGEN) 10 MG CAPS Take by mouth in the morning.   atorvastatin (LIPITOR) 40 MG tablet Take 1 tablet (40 mg total) by mouth daily.   diclofenac sodium (VOLTAREN) 1 % GEL Apply 2 g topically 4 (four) times daily.   DOXYCYCLINE HYCLATE PO Take 100 mg by mouth as needed.   ibandronate (BONIVA) 150 MG tablet Take in the  morning with a full glass of water once a month, on an empty stomach, do not take anything else by mouth or lie down for the next 30 min.   metFORMIN (GLUCOPHAGE-XR) 500 MG 24 hr tablet Take 1 tablet (500 mg total) by mouth 2 (two) times daily with a meal.   methocarbamol (ROBAXIN) 500 MG tablet Take 500 mg by mouth daily as needed (migraine).   Multiple Vitamins-Minerals (CENTRUM SILVER 50+WOMEN PO) Take by mouth in the morning.   NON FORMULARY Viviscal   pantoprazole (PROTONIX) 40 MG tablet Take 1 tablet (40 mg total) by mouth daily.   phentermine (ADIPEX-P) 37.5 MG tablet Take 0.5 tablets (18.75 mg total) by mouth daily before breakfast.   valACYclovir (VALTREX) 500 MG tablet Take 1 tablet (500 mg total) by mouth daily.   zonisamide (ZONEGRAN) 100 MG capsule Take 100 mg by mouth 2 (two) times daily.   azithromycin (ZITHROMAX) 500 MG tablet Take 1 tablet (500 mg total) by mouth daily. (Patient not taking: Reported on 08/11/2023)   No facility-administered encounter medications on file as of 08/11/2023.    Allergies (verified) Iohexol, Ivp dye [iodinated contrast media], Codeine, and Onion   History: Past Medical History:  Diagnosis Date   Abnormal Pap smear of cervix    in 20's   Anxiety    claustrophobic   Arthritis    "  neck, hands" (09/02/2013)   Asthma    Basal cell carcinoma of forehead 2008; 2014   Dizziness last occurance 10-15-2014   on occasion, not related to activity   DVT (deep venous thrombosis) (HCC) 06/28/2014   Dr Susann Givens, right leg, behind knee   Environmental allergies    Exercise-induced asthma 30's   Gallbladder problem    GERD (gastroesophageal reflux disease)    H/O hiatal hernia    Herpes simplex 1976   History of colonic polyps 05/21/2016   History of kidney stones    Hx of blood clots    Hyperlipidemia    IBS (irritable bowel syndrome)    Joint pain    Kidney problem    Migraine teens, returned in 40"s   "depends on the change in the weather; last  one was 1 wk ago" (09/02/2013)   Obesity    Osteoarthritis    Plantar fasciitis    PONV (postoperative nausea and vomiting) 08/2013   ponv after gallblaffer   Prediabetes    Right ureteral stone    Seasonal allergic rhinitis    SOB (shortness of breath)    Tubular adenoma of colon 05/22/2017   per path report.    Tubular adenoma of colon 06/29/2020   Past Surgical History:  Procedure Laterality Date   ABDOMINAL HYSTERECTOMY  1992   CESAREAN SECTION  1981   CHOLECYSTECTOMY N/A 09/02/2013   Procedure: LAPAROSCOPIC CHOLECYSTECTOMY;  Surgeon: Shelly Rubenstein, MD;  Location: [facility];  Service: General;  Laterality: N/A;   CRYOTHERAPY     for abnormal pap smear in 20's   CYSTOSCOPY W/ URETERAL STENT PLACEMENT  03/05/2012   Procedure: CYSTOSCOPY WITH RETROGRADE PYELOGRAM/URETERAL STENT PLACEMENT;  Surgeon: Lindaann Slough, MD;  Location: [facility];  Service: Urology;  Laterality: Right;   CYSTOSCOPY W/ URETERAL STENT PLACEMENT Right 01/19/2015   Procedure: CYSTOSCOPY WITH RETROGRADE PYELOGRAM/URETERAL  RIGHT STENT PLACEMENT;  Surgeon: Sebastian Ache, MD;  Location: [facility];  Service: Urology;  Laterality: Right;   CYSTOSCOPY WITH RETROGRADE PYELOGRAM, URETEROSCOPY AND STENT PLACEMENT Right 09/08/2014   Procedure: CYSTOSCOPY WITH RIGHT  RETROGRADE PYELOGRAM, URETEROSCOPY AND RIGHT STENT PLACEMENT;  Surgeon: Heloise Purpura, MD;  Location: [facility];  Service: Urology;  Laterality: Right;   CYSTOSCOPY WITH RETROGRADE PYELOGRAM, URETEROSCOPY AND STENT PLACEMENT Right 10/19/2014   Procedure: CYSTOSCOPY WITH Bilateral RETROGRADE PYELOGRAM/RIGHT URETEROSCOPY,RIGHT JJ STENT;  Surgeon: Jethro Bolus, MD;  Location: [facility];  Service: Urology;  Laterality: Right;   CYSTOSCOPY/RETROGRADE/URETEROSCOPY  11/18/2011   Procedure: CYSTOSCOPY/RETROGRADE/URETEROSCOPY;  Surgeon: Kathi Ludwig, MD;  Location: [facility];  Service: Urology;  Laterality: Right;    CYSTOSCOPY/RETROGRADE/URETEROSCOPY/STONE EXTRACTION WITH BASKET  10/21/2011   Procedure: CYSTOSCOPY/RETROGRADE/URETEROSCOPY/STONE EXTRACTION WITH BASKET;  Surgeon: Kathi Ludwig, MD;  Location[facility];  Service: Urology;  Laterality: Right;   CYSTOSCOPY/RETROGRADE/URETEROSCOPY/STONE EXTRACTION WITH BASKET Right 09/10/2012   Procedure: RIGHT URETEROSCOPY/STONE EXTRACTION/LASER LITHOTRIPSY/POSSIBLE DOUBLE J STENT PLACEMENT;  Surgeon: Kathi Ludwig, MD;  Location: [facility];  Service: Urology;  Laterality: Right;   CYSTOSCOPY/URETEROSCOPY/HOLMIUM LASER/STENT PLACEMENT Left 05/02/2021   Procedure: CYSTOSCOPY/URETEROSCOPY/HOLMIUM LASER/STENT PLACEMENT;  Surgeon: Jerilee Field, MD;  Location: [facility];  Service: Urology;  Laterality: Left;   MOHS SURGERY  2008; 2014   PLANTAR FASCIA SURGERY  05/2016   POSTERIOR PINNACLE MESH SACROSPINOUS REPAIR  11-22-2010   RECURRENT ENTEROCELE   RIGHT URETEROSCOPIC STONE EXTRACTION W/ STENT PLACEMENT  08-12-2011   x2 stents   ROBOTICALLY ASSISTED LAPAROSCOPIC URETERAL RE-IMPLANTATION Right 01/19/2015   Procedure: ROBOTICALLY  ASSISTED LAPAROSCOPIC URETERAL RE-IMPLANTATION RIGHT;  Surgeon: Sebastian Ache, MD;  Location: [facility];  Service: Urology;  Laterality: Right;   TUBAL LIGATION  1991   VAULT SUSPENSION (DERMAL GRAFT)/ ENTEROCELE AND RECTOCELE REPAIR  12-05-2005   VAULT PROLAPSE/ ENTERCELE/ RECTOCELE   Family History  Problem Relation Age of Onset   High blood pressure Mother    High Cholesterol Mother    Cancer Father        bladder cancer   Social History   Socioeconomic History   Marital status: Married    Spouse name: Lilla Shook   Number of children: Not on file   Years of education: Not on file   Highest education level: Some college, no degree  Occupational History   Occupation: Retired  Tobacco Use   Smoking status: Never   Smokeless tobacco: Never  Vaping Use   Vaping status: Never Used  Substance and  Sexual Activity   Alcohol use: Not Currently   Drug use: No   Sexual activity: Yes    Partners: Female    Birth control/protection: Surgical    Comment: hysterectomy  Other Topics Concern   Not on file  Social History Narrative   Not on file   Social Drivers of Health   Financial Resource Strain: Low Risk  (08/11/2023)   Overall Financial Resource Strain (CARDIA)    Difficulty of Paying Living Expenses: Not hard at all  Food Insecurity: No Food Insecurity (08/11/2023)   Hunger Vital Sign    Worried About Running Out of Food in the Last Year: Never true    Ran Out of Food in the Last Year: Never true  Transportation Needs: No Transportation Needs (08/11/2023)   PRAPARE - Administrator, Civil Service (Medical): No    Lack of Transportation (Non-Medical): No  Physical Activity: Insufficiently Active (08/11/2023)   Exercise Vital Sign    Days of Exercise per Week: 7 days    Minutes of Exercise per Session: 20 min  Stress: No Stress Concern Present (08/11/2023)   [employer] of Occupational Health - Occupational Stress Questionnaire    Feeling of Stress : Only a little  Social Connections: Socially Integrated (08/11/2023)   Social Connection and Isolation Panel [NHANES]    Frequency of Communication with Friends and Family: More than three times a week    Frequency of Social Gatherings with Friends and Family: Once a week    Attends Religious Services: More than 4 times per year    Active Member of [employer] or Organizations: Yes    Attends Engineer, structural: More than 4 times per year    Marital Status: Married    Tobacco Counseling Counseling given: Not Answered   Clinical Intake:  Pre-visit preparation completed: Yes  Pain : 0-10 Pain Score: 2  Pain Type: Acute pain Pain Location: Back Pain Orientation: Lower Pain Descriptors / Indicators: Sore Pain Onset: Today Pain Frequency: Constant     Nutritional Risks: None Diabetes: No  How  often do you need to have someone help you when you read instructions, pamphlets, or other written materials from your doctor or pharmacy?: 1 - Never  Interpreter Needed?: No  Information entered by :: NAllen LPN   Activities of Daily Living    08/11/2023    9:57 AM  In your present state of health, do you have any difficulty performing the following activities:  Hearing? 0  Vision? 0  Difficulty concentrating or making decisions? 0  Walking or  climbing stairs? 1  Dressing or bathing? 0  Doing errands, shopping? 0  Preparing Food and eating ? N  Using the Toilet? N  In the past six months, have you accidently leaked urine? Y  Do you have problems with loss of bowel control? Y  Managing your Medications? N  Managing your Finances? N  Housekeeping or managing your Housekeeping? N    Patient Care Team: Ronnald Nian, MD as PCP - General (Family Medicine)  Indicate any recent Medical Services you may have received from other than [facility] providers in the past year (date may be approximate).     Assessment:   This is a routine wellness examination for Hiilei.  Hearing/Vision screen Hearing Screening - Comments:: Denies hearing issues Vision Screening - Comments:: Regular eye exams, [facility]   Goals Addressed             This Visit's Progress    Patient Stated       08/11/2023, lose weight and change eating habits       Depression Screen    08/11/2023   10:08 AM 07/23/2023   10:44 AM 10/08/2022    1:55 PM 08/18/2022   10:45 AM 08/05/2022   10:35 AM 08/14/2021    2:16 PM 08/02/2021   10:48 AM  PHQ 2/9 Scores  PHQ - 2 Score 0 0 0 0 0 0 0  PHQ- 9 Score 6    3      Fall Risk    08/11/2023   10:07 AM 07/23/2023   10:42 AM 08/05/2022   10:35 AM 08/02/2021   10:46 AM 07/31/2021    9:54 AM  Fall Risk   Falls in the past year? 1 1 0 1 1  Comment tripped x 2, slipped on yoga mat   passed out   Number falls in past yr: 1 1 0 0 0  Injury with Fall? 1 1 0 1 1  Comment  broke wrist and scraped up face currently in physical therapy     Risk for fall due to : History of fall(s);Medication side effect  Medication side effect Medication side effect   Follow up Falls prevention discussed;Falls evaluation completed  Falls prevention discussed;Education provided;Falls evaluation completed Falls evaluation completed;Education provided;Falls prevention discussed     MEDICARE RISK AT HOME: Medicare Risk at Home Any stairs in or around the home?: Yes If so, are there any without handrails?: No Home free of loose throw rugs in walkways, pet beds, electrical cords, etc?: Yes Adequate lighting in your home to reduce risk of falls?: Yes Life alert?: No Use of a cane, walker or w/c?: No Grab bars in the bathroom?: Yes Shower chair or bench in shower?: No Elevated toilet seat or a handicapped toilet?: Yes  TIMED UP AND GO:  Was the test performed?  Yes  Length of time to ambulate 10 feet: 5 sec Gait steady and fast without use of assistive device    Cognitive Function:        08/11/2023   10:10 AM 08/05/2022   10:40 AM 08/02/2021   10:48 AM  6CIT Screen  What Year? 0 points 0 points 0 points  What month? 0 points 0 points 0 points  What time? 0 points 0 points 0 points  Count back from 20 0 points 0 points 0 points  Months in reverse 0 points 0 points 0 points  Repeat phrase 2 points 0 points 0 points  Total Score 2 points 0 points  0 points    Immunizations Immunization History  Administered Date(s) Administered   Fluad Quad(high Dose 65+) 03/13/2019, 02/28/2020, 03/07/2021, 03/04/2022   Fluad Trivalent(High Dose 65+) 03/06/2023   Influenza Whole 04/19/2007, 09/04/2008   Influenza-Unspecified 03/23/2013, 03/14/2016, 04/12/2017, 03/13/2019   PFIZER Comirnaty(Gray Top)Covid-19 Tri-Sucrose Vaccine 10/18/2020   PFIZER(Purple Top)SARS-COV-2 Vaccination 07/31/2019, 08/24/2019, 04/04/2020   Pfizer Covid-19 Vaccine Bivalent Booster 91yrs & up 04/09/2021    Pfizer(Comirnaty)Fall Seasonal Vaccine 12 years and older 04/11/2022, 03/06/2023   Pneumococcal Conjugate-13 12/14/2018   Pneumococcal Polysaccharide-23 12/22/2019   RSV,unspecified 06/27/2022   Td 12/13/2001   Tdap 06/23/2008, 12/14/2018   Zoster Recombinant(Shingrix) 06/17/2017, 08/24/2017   Zoster, Live 06/01/2015    TDAP status: Up to date  Flu Vaccine status: Up to date  Pneumococcal vaccine status: Up to date  Covid-19 vaccine status: Completed vaccines  Qualifies for Shingles Vaccine? Yes   Zostavax completed Yes   Shingrix Completed?: Yes  Screening Tests Health Maintenance  Topic Date Due   COVID-19 Vaccine (7 - 2024-25 season) 05/01/2023   MAMMOGRAM  07/02/2024   Medicare Annual Wellness (AWV)  08/10/2024   DTaP/Tdap/Td (4 - Td or Tdap) 12/13/2028   Colonoscopy  06/27/2030   Pneumonia Vaccine 62+ Years old  Completed   INFLUENZA VACCINE  Completed   DEXA SCAN  Completed   Hepatitis C Screening  Completed   Zoster Vaccines- Shingrix  Completed   HPV VACCINES  Aged Out    Health Maintenance  Health Maintenance Due  Topic Date Due   COVID-19 Vaccine (7 - 2024-25 season) 05/01/2023    Colorectal cancer screening: Type of screening: Colonoscopy. Completed 06/27/2020. Repeat every 10 years  Mammogram status: Completed 07/03/2023. Repeat every year  Bone Density status: scheduled for 03/25/2024  Lung Cancer Screening: (Low Dose CT Chest recommended if Age 35-80 years, 20 pack-year currently smoking OR have quit w/in 15years.) does not qualify.   Lung Cancer Screening Referral: no  Additional Screening:  Hepatitis C Screening: does qualify; Completed 05/15/2015  Vision Screening: Recommended annual ophthalmology exams for early detection of glaucoma and other disorders of the eye. Is the patient up to date with their annual eye exam?  Yes  Who is the provider or what is the name of the office in which the patient attends annual eye exams? [facility] If  pt is not established with a provider, would they like to be referred to a provider to establish care? No .   Dental Screening: Recommended annual dental exams for proper oral hygiene  Diabetic Foot Exam: n/a  Community Resource Referral / Chronic Care Management: CRR required this visit?  No   CCM required this visit?  No     Plan:     I have personally reviewed and noted the following in the patient's chart:   Medical and social history Use of alcohol, tobacco or illicit drugs  Current medications and supplements including opioid prescriptions. Patient is not currently taking opioid prescriptions. Functional ability and status Nutritional status Physical activity Advanced directives List of other physicians Hospitalizations, surgeries, and ER visits in previous 12 months Vitals Screenings to include cognitive, depression, and falls Referrals and appointments  In addition, I have reviewed and discussed with patient certain preventive protocols, quality metrics, and best practice recommendations. A written personalized care plan for preventive services as well as general preventive health recommendations were provided to patient.     Barb Merino, LPN   1/61/0960   After Visit Summary: (In Person-Printed) AVS printed and  given to the patient  Nurse Notes: none

## 2023-08-18 ENCOUNTER — Encounter: Payer: Self-pay | Admitting: Internal Medicine

## 2023-08-19 DIAGNOSIS — D2272 Melanocytic nevi of left lower limb, including hip: Secondary | ICD-10-CM | POA: Diagnosis not present

## 2023-08-19 DIAGNOSIS — L988 Other specified disorders of the skin and subcutaneous tissue: Secondary | ICD-10-CM | POA: Diagnosis not present

## 2023-08-19 DIAGNOSIS — D485 Neoplasm of uncertain behavior of skin: Secondary | ICD-10-CM | POA: Diagnosis not present

## 2023-09-07 ENCOUNTER — Ambulatory Visit (INDEPENDENT_AMBULATORY_CARE_PROVIDER_SITE_OTHER): Payer: PPO | Admitting: Internal Medicine

## 2023-09-07 ENCOUNTER — Encounter (INDEPENDENT_AMBULATORY_CARE_PROVIDER_SITE_OTHER): Payer: Self-pay | Admitting: Internal Medicine

## 2023-09-07 VITALS — BP 101/71 | HR 86 | Temp 97.7°F | Ht 60.0 in | Wt 153.0 lb

## 2023-09-07 DIAGNOSIS — Z6831 Body mass index (BMI) 31.0-31.9, adult: Secondary | ICD-10-CM | POA: Diagnosis not present

## 2023-09-07 DIAGNOSIS — R7303 Prediabetes: Secondary | ICD-10-CM

## 2023-09-07 DIAGNOSIS — G479 Sleep disorder, unspecified: Secondary | ICD-10-CM | POA: Diagnosis not present

## 2023-09-07 DIAGNOSIS — E66811 Obesity, class 1: Secondary | ICD-10-CM

## 2023-09-07 MED ORDER — METFORMIN HCL ER 500 MG PO TB24: 500.0000 mg | ORAL_TABLET | Freq: Two times a day (BID) | ORAL | 0 refills | Status: DC

## 2023-09-07 MED ORDER — LOMAIRA 8 MG PO TABS: 8.0000 mg | ORAL_TABLET | Freq: Every day | ORAL | 0 refills | Status: DC

## 2023-09-07 NOTE — Assessment & Plan Note (Signed)
 She is undergoing medical weight management and has lost three pounds since the last visit. She follows a 1200 calorie diet plan 80% of the time and consumes more whole foods. Phentermine was initially prescribed, but she experienced significant side effects including nausea, depressed mood, and visual disturbances. Despite these, she reports a significant reduction in appetite and portion sizes, leading to weight loss. A lower dose formulation, Lomira, is suggested to mitigate side effects while maintaining appetite suppression.  - Prescribe Lomira 8 mg with instructions to start at half a tablet (4 mg) daily for a week. - Monitor for side effects and adjust dosage if necessary. - Encourage continued adherence to a reduced-calorie diet. - Schedule follow-up appointment in three weeks.

## 2023-09-07 NOTE — Assessment & Plan Note (Signed)
 Chronic, preceding use of phentermine, has problems with maintaining sleep.She reports difficulty maintaining sleep, waking up around 2 AM and dozing until 5 AM. She falls asleep easily but struggles with sleep maintenance. Potential factors include blue light exposure and poor sleep hygiene. Improving sleep is important as it can affect weight, energy levels, and mood. - Refer to the [employer] website for information on sleep hygiene. - Recommend magnesium glycinate or extended-release melatonin (no more than 3 mg) as supplements. - Suggest sleepy time tea with valerian root, chamomile, and lavender. - Advise consistent use of supplements and tea an hour before bedtime.

## 2023-09-07 NOTE — Progress Notes (Signed)
 Office: [phone]  /  Fax: [phone]  Weight Summary And Biometrics  Vitals Temp: 97.7 F (36.5 C) BP: 101/71 Pulse Rate: 86 SpO2: 98 %   Anthropometric Measurements Height: 5' (1.524 m) Weight: 153 lb (69.4 kg) BMI (Calculated): 29.88 Weight at Last Visit: 156 lb Weight Lost Since Last Visit: 3 lb Weight Gained Since Last Visit: 0 Starting Weight: 159 lb Total Weight Loss (lbs): 6 lb (2.722 kg) Peak Weight: 175 lb   Body Composition  Body Fat %: 40.9 % Fat Mass (lbs): 62.6 lbs Muscle Mass (lbs): 86 lbs Total Body Water (lbs): 59 lbs Visceral Fat Rating : 11    No data recorded Today's Visit #: 13  Starting Date: 09/16/22   Subjective   Chief Complaint: Obesity  Interval History Discussed the use of AI scribe software for clinical note transcription with the patient, who gave verbal consent to proceed.  History of Present Illness   Carol [name] "Carol [name]" is a 74 year old female who presents for medical weight management.  She has lost three pounds since her last visit and adheres to a 1200 calorie diet plan approximately 80% of the time, consuming more whole foods.  She experiences significant side effects from Carol [name], including nausea and a depressed mood during the initial weeks of use. She describes feeling 'really sick to my stomach' and 'really depressed,' to the extent that friends noticed a change in her demeanor. These symptoms have somewhat subsided over time.  She experiences visual disturbances, describing a 'haze around my eyes' that begins about an hour after waking and persists throughout the day. No eye pain is reported, and these symptoms began a few days after starting the medication.  The medication has effectively suppressed her appetite, leading to reduced food intake and a quicker sense of fullness. She notes, 'I'm eating a lot less. I feel a whole lot quicker.'  Contrary to the expected stimulating effects of  Carol [name], she experiences fatigue and a lack of energy, stating, 'I felt so tired, like just, uh, I had to make myself get up and do stuff.'  She reports poor sleep quality, getting only four to five hours of sleep per night. She falls asleep easily but wakes up around 2 AM and dozes until 5 AM, when she is fully awake. She typically falls asleep with the TV on, which is located far from her bed.        Challenges affecting patient progress: Side effects to antiobesity medication   Pharmacotherapy for weight management: She is currently taking Carol [name] (longterm use, single agent)  with adequate clinical response  and experiencing the following side effects: Visual changes in the form of a glare..   Assessment and Plan   Treatment Plan For Obesity:  Recommended Dietary Goals  Carol [name] is currently in the action stage of change. As such, her goal is to continue weight management plan. She has agreed to: continue current plan  Behavioral Health and Counseling  We discussed the following behavioral modification strategies today: continue to work on maintaining a reduced calorie state, getting the recommended amount of protein, incorporating whole foods, making healthy choices, staying well hydrated and practicing mindfulness when eating..  Additional education and resources provided today: None  Recommended Physical Activity Goals  Carol [name] has been advised to work up to 150 minutes of moderate intensity aerobic activity a week and strengthening exercises 2-3 times per week for cardiovascular health, weight loss maintenance and preservation of muscle mass.   She  has agreed to :  Think about enjoyable ways to increase daily physical activity and overcoming barriers to exercise and Increase physical activity in their day and reduce sedentary time (increase NEAT).  Pharmacotherapy  We discussed various medication options to help Carol [name] with her weight loss efforts and we both agreed to :   Discontinue Carol [name] 18.75 mg I would like to try Lomaira half a tablet or 4 mg in the morning.  Associated Conditions Impacted by Obesity Treatment  Class 1 obesity with serious comorbidity and body mass index (BMI) of 31.0 to 31.9 in adult, unspecified obesity type Assessment & Plan: She is undergoing medical weight management and has lost three pounds since the last visit. She follows a 1200 calorie diet plan 80% of the time and consumes more whole foods. Carol [name] was initially prescribed, but she experienced significant side effects including nausea, depressed mood, and visual disturbances. Despite these, she reports a significant reduction in appetite and portion sizes, leading to weight loss. A lower dose formulation, Carol [name], is suggested to mitigate side effects while maintaining appetite suppression.  - Prescribe Carol [name] 8 mg with instructions to start at half a tablet (4 mg) daily for a week. - Monitor for side effects and adjust dosage if necessary. - Encourage continued adherence to a reduced-calorie diet. - Schedule follow-up appointment in three weeks.  Orders: Carol [name]; Take 1 tablet (8 mg total) by mouth daily.  Dispense: 28 tablet; Refill: 0  Prediabetes Assessment & Plan:  Most recent A1c was 5.8 and stable.  She is currently on metformin XR 500 mg twice a day and will continue medication for pharmacoprophylaxis.  Continue current weight management strategy  Orders: -     metFORMIN HCl ER; Take 1 tablet (500 mg total) by mouth 2 (two) times daily with a meal.  Dispense: 180 tablet; Refill: 0  Sleep difficulties Assessment & Plan: Chronic, preceding use of Carol [name], has problems with maintaining sleep.She reports difficulty maintaining sleep, waking up around 2 AM and dozing until 5 AM. She falls asleep easily but struggles with sleep maintenance. Potential factors include blue light exposure and poor sleep hygiene. Improving sleep is important as it can affect  weight, energy levels, and mood. - Refer to the [employer] website for information on sleep hygiene. - Recommend magnesium glycinate or extended-release melatonin (no more than 3 mg) as supplements. - Suggest sleepy time tea with valerian root, chamomile, and lavender. - Advise consistent use of supplements and tea an hour before bedtime.                Objective   Physical Exam:  Blood pressure 101/71, pulse 86, temperature 97.7 F (36.5 C), height 5' (1.524 m), weight 153 lb (69.4 kg), last menstrual period 06/23/1990, SpO2 98%. Body mass index is 29.88 kg/m.  General: She is overweight, cooperative, alert, well developed, and in no acute distress. PSYCH: Has normal mood, affect and thought process.   HEENT: EOMI, sclerae are anicteric. Lungs: Normal breathing effort, no conversational dyspnea. Extremities: No edema.  Neurologic: No gross sensory or motor deficits. No tremors or fasciculations noted.    Diagnostic Data Reviewed:  BMET    Component Value Date/Time   NA 141 07/23/2023 1133   K 4.7 07/23/2023 1133   CL 108 (H) 07/23/2023 1133   CO2 20 07/23/2023 1133   GLUCOSE 100 (H) 07/23/2023 1133   GLUCOSE 143 (H) 05/07/2021 0442   BUN 16 07/23/2023 1133   CREATININE  0.97 07/23/2023 1133   CREATININE 0.76 03/16/2017 1354   CALCIUM 8.7 07/23/2023 1133   GFRNONAA >60 05/07/2021 0442   GFRAA 79 10/21/2019 1326   Lab Results  Component Value Date   HGBA1C 5.8 (A) 07/23/2023   HGBA1C 5.6 02/22/2013   Lab Results  Component Value Date   INSULIN 11.1 09/16/2022   Lab Results  Component Value Date   TSH 2.380 08/18/2022   CBC    Component Value Date/Time   WBC 8.7 07/23/2023 1133   WBC 14.2 (H) 05/07/2021 0446   RBC 5.11 07/23/2023 1133   RBC 3.78 (L) 05/07/2021 0446   HGB 14.3 07/23/2023 1133   HGB 14.3 05/21/2016 1128   HCT 44.1 07/23/2023 1133   PLT 313 07/23/2023 1133   MCV 86 07/23/2023 1133   MCH 28.0 07/23/2023 1133   MCH  29.1 05/07/2021 0446   MCHC 32.4 07/23/2023 1133   MCHC 33.3 05/07/2021 0446   RDW 13.6 07/23/2023 1133   Iron Studies    Component Value Date/Time   IRON 47 10/22/2022 1430   TIBC 323 10/22/2022 1430   FERRITIN 166 (H) 10/22/2022 1430   IRONPCTSAT 15 10/22/2022 1430   Lipid Panel     Component Value Date/Time   CHOL 190 07/23/2023 1133   TRIG 163 (H) 07/23/2023 1133   HDL 56 07/23/2023 1133   CHOLHDL 3.4 07/23/2023 1133   CHOLHDL 3.2 05/21/2016 1407   VLDL 44 (H) 05/21/2016 1407   LDLCALC 106 (H) 07/23/2023 1133   Hepatic Function Panel     Component Value Date/Time   PROT 6.8 07/23/2023 1133   ALBUMIN 4.5 07/23/2023 1133   AST 23 07/23/2023 1133   ALT 19 07/23/2023 1133   ALKPHOS 85 07/23/2023 1133   BILITOT 0.2 07/23/2023 1133      Component Value Date/Time   TSH 2.380 08/18/2022 1310   Nutritional Lab Results  Component Value Date   VD25OH 51.2 04/23/2022   VD25OH 39.4 05/27/2017   VD25OH 31 05/21/2016    Medications: Outpatient Encounter Medications as of 09/07/2023  Medication Sig Note   albuterol (PROAIR HFA) 108 (90 Base) MCG/ACT inhaler INHALE 2 PUFFS INTO THE LUNGS EVERY 6 HOURS AS NEEDED FOR WHEEZING/ SHORTNESS OF BREATH.    Apoaequorin (PREVAGEN) 10 MG CAPS Take by mouth in the morning.    atorvastatin (LIPITOR) 40 MG tablet Take 1 tablet (40 mg total) by mouth daily.    azithromycin (ZITHROMAX) 500 MG tablet Take 1 tablet (500 mg total) by mouth daily.    diclofenac sodium (VOLTAREN) 1 % GEL Apply 2 g topically 4 (four) times daily.    DOXYCYCLINE HYCLATE PO Take 100 mg by mouth as needed.    ibandronate (BONIVA) 150 MG tablet Take in the morning with a full glass of water once a month, on an empty stomach, do not take anything else by mouth or lie down for the next 30 min.    methocarbamol (ROBAXIN) 500 MG tablet Take 500 mg by mouth daily as needed (migraine).    Multiple Vitamins-Minerals (CENTRUM SILVER 50+WOMEN PO) Take by mouth in the  morning.    NON FORMULARY Viviscal    pantoprazole (PROTONIX) 40 MG tablet Take 1 tablet (40 mg total) by mouth daily.    Carol [name] HCl (LOMAIRA) 8 MG TABS Take 1 tablet (8 mg total) by mouth daily.    valACYclovir (VALTREX) 500 MG tablet Take 1 tablet (500 mg total) by mouth daily.    zonisamide (ZONEGRAN) 100  MG capsule Take 100 mg by mouth 2 (two) times daily.    [DISCONTINUED] metFORMIN (GLUCOPHAGE-XR) 500 MG 24 hr tablet Take 1 tablet (500 mg total) by mouth 2 (two) times daily with a meal.    [DISCONTINUED] Carol [name] (Carol [name]-P) 37.5 MG tablet Take 0.5 tablets (18.75 mg total) by mouth daily before breakfast. 09/07/2023: visual changes at the 18.75   metFORMIN (GLUCOPHAGE-XR) 500 MG 24 hr tablet Take 1 tablet (500 mg total) by mouth 2 (two) times daily with a meal.    No facility-administered encounter medications on file as of 09/07/2023.     Follow-Up   Return in about 3 weeks (around 09/28/2023) for For Weight Mangement with Dr. Rikki Spearing.Marland Kitchen She was informed of the importance of frequent follow up visits to maximize her success with intensive lifestyle modifications for her multiple health conditions.  Attestation Statement   Reviewed by clinician on day of visit: allergies, medications, problem list, medical history, surgical history, family history, social history, and previous encounter notes.     Worthy Rancher, MD

## 2023-09-07 NOTE — Assessment & Plan Note (Signed)
  Most recent A1c was 5.8 and stable.  She is currently on metformin XR 500 mg twice a day and will continue medication for pharmacoprophylaxis.  Continue current weight management strategy

## 2023-09-09 ENCOUNTER — Telehealth (INDEPENDENT_AMBULATORY_CARE_PROVIDER_SITE_OTHER): Payer: Self-pay | Admitting: Internal Medicine

## 2023-09-09 NOTE — Telephone Encounter (Signed)
 Patient called in stating that her pharmacy needs a PA for [facility]. Please follow up with the patient.

## 2023-09-10 ENCOUNTER — Encounter (INDEPENDENT_AMBULATORY_CARE_PROVIDER_SITE_OTHER): Payer: Self-pay

## 2023-09-21 ENCOUNTER — Other Ambulatory Visit: Payer: Self-pay

## 2023-09-21 ENCOUNTER — Ambulatory Visit
Admission: EM | Admit: 2023-09-21 | Discharge: 2023-09-21 | Disposition: A | Discharge status: Home / Self Care | Attending: Family Medicine | Admitting: Family Medicine

## 2023-09-21 ENCOUNTER — Encounter: Payer: Self-pay | Admitting: Emergency Medicine

## 2023-09-21 ENCOUNTER — Emergency Department (HOSPITAL_COMMUNITY)

## 2023-09-21 ENCOUNTER — Encounter (HOSPITAL_COMMUNITY): Payer: Self-pay

## 2023-09-21 ENCOUNTER — Emergency Department (HOSPITAL_COMMUNITY)
Admission: EM | Admit: 2023-09-21 | Discharge: 2023-09-21 | Disposition: A | Discharge status: Home / Self Care | Attending: Emergency Medicine | Admitting: Emergency Medicine

## 2023-09-21 ENCOUNTER — Encounter (HOSPITAL_COMMUNITY)

## 2023-09-21 DIAGNOSIS — R0789 Other chest pain: Secondary | ICD-10-CM | POA: Diagnosis not present

## 2023-09-21 DIAGNOSIS — Z79899 Other long term (current) drug therapy: Secondary | ICD-10-CM | POA: Insufficient documentation

## 2023-09-21 DIAGNOSIS — R079 Chest pain, unspecified: Secondary | ICD-10-CM

## 2023-09-21 DIAGNOSIS — R600 Localized edema: Secondary | ICD-10-CM | POA: Diagnosis not present

## 2023-09-21 DIAGNOSIS — I10 Essential (primary) hypertension: Secondary | ICD-10-CM | POA: Diagnosis not present

## 2023-09-21 LAB — CBC
HCT: 45.3 % (ref 36.0–46.0)
Hemoglobin: 14.4 g/dL (ref 12.0–15.0)
MCH: 27.9 pg (ref 26.0–34.0)
MCHC: 31.8 g/dL (ref 30.0–36.0)
MCV: 87.8 fL (ref 80.0–100.0)
Platelets: 302 10*3/uL (ref 150–400)
RBC: 5.16 MIL/uL — ABNORMAL HIGH (ref 3.87–5.11)
RDW: 13.2 % (ref 11.5–15.5)
WBC: 8.6 10*3/uL (ref 4.0–10.5)
nRBC: 0 % (ref 0.0–0.2)

## 2023-09-21 LAB — BASIC METABOLIC PANEL WITH GFR
Anion gap: 9 (ref 5–15)
BUN: 21 mg/dL (ref 8–23)
CO2: 26 mmol/L (ref 22–32)
Calcium: 9.7 mg/dL (ref 8.9–10.3)
Chloride: 105 mmol/L (ref 98–111)
Creatinine, Ser: 0.89 mg/dL (ref 0.44–1.00)
GFR, Estimated: >60 mL/min (ref >60)
Glucose, Bld: 111 mg/dL — ABNORMAL HIGH (ref 70–99)
Potassium: 4.3 mmol/L (ref 3.5–5.1)
Sodium: 140 mmol/L (ref 135–145)

## 2023-09-21 LAB — D-DIMER, QUANTITATIVE: D-Dimer, Quant: <0.27 ug{FEU}/mL (ref 0.00–0.50)

## 2023-09-21 LAB — TROPONIN I (HIGH SENSITIVITY)
Troponin I (High Sensitivity): <2 ng/L (ref <18)
Troponin I (High Sensitivity): <2 ng/L (ref <18)

## 2023-09-21 NOTE — ED Notes (Signed)
 Patient is being discharged from the Urgent Care and sent to the Emergency Department via POV  . Per Cheri Rous, NP, patient is in need of higher level of care due to Chest pain. Patient is aware and verbalizes understanding of plan of care.  Vitals:   09/21/23 1445  BP: 132/84  Pulse: 90  Resp: 15  Temp: (!) 97.5 F (36.4 C)  SpO2: 95%

## 2023-09-21 NOTE — ED Provider Notes (Signed)
 [place] [facility]ENC[facility] DEPARTMENT AT [facility] Provider Note   CSN: [id number] Arrival date & time: 09/21/23  1522     History  Chief Complaint  Patient presents with   Chest Pain    Carol [name] is a 73 y.o. female with a history of high blood pressure, high cholesterol, presented to ED with concern for chest pain.  Patient reports that she woke up this morning and was taking a shower, no significant exertion, and began to have a sudden sharp pain in her left shoulder blade to the front of her chest.  She says it hurt with deep inspiration.  The pain lasted a few minutes and gradually subsided, then recurred and subsided.  She is currently asymptomatic.  She went to urgent care originally was referred to the ED for further evaluation.  She does have a prior history of a DVT several years ago, which is felt to be provoked in the setting of estrogen hormonal use for menopausal symptoms.  She was briefly on anticoagulation and subsequently discontinued, and is also no longer taking hormonal therapy.  She denies to me any unilateral leg pain or swelling over the past few days.  She currently denies chest pain or shortness of breath.  She denies prior history of MI or coronary disease.  She does not typically have exertional chest pain or pressure.  She had a normal stress test several years ago in 2011.  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  These are anaphylaxis with throat swelling, hives.  She says that they attempted premedication in the past and "it did not work, I still had a bad reaction".  HPI     Home Medications Prior to Admission medications   Medication Sig Start Date End Date Taking? Authorizing Provider  albuterol (PROAIR HFA) 108 (90 Base) MCG/ACT inhaler INHALE 2 PUFFS INTO THE LUNGS EVERY 6 HOURS AS NEEDED FOR WHEEZING/ SHORTNESS OF BREATH. 07/23/23   Ronnald Nian, MD  Apoaequorin (PREVAGEN) 10 MG CAPS Take by  mouth in the morning.    [provider]  atorvastatin (LIPITOR) 40 MG tablet Take 1 tablet (40 mg total) by mouth daily. 07/24/23   Ronnald Nian, MD  azithromycin (ZITHROMAX) 500 MG tablet Take 1 tablet (500 mg total) by mouth daily. 02/11/23   Ronnald Nian, MD  diclofenac sodium (VOLTAREN) 1 % GEL Apply 2 g topically 4 (four) times daily.    [provider]  DOXYCYCLINE HYCLATE PO Take 100 mg by mouth as needed.    [provider]  ibandronate (BONIVA) 150 MG tablet Take in the morning with a full glass of water once a month, on an empty stomach, do not take anything else by mouth or lie down for the next 30 min. 05/04/23   Ronnald Nian, MD  metFORMIN (GLUCOPHAGE-XR) 500 MG 24 hr tablet Take 1 tablet (500 mg total) by mouth 2 (two) times daily with a meal. 09/07/23   Worthy Rancher, MD  methocarbamol (ROBAXIN) 500 MG tablet Take 500 mg by mouth daily as needed (migraine). 05/31/18   [provider]  Multiple Vitamins-Minerals (CENTRUM SILVER 50+WOMEN PO) Take by mouth in the morning.    [provider]  NON FORMULARY Viviscal    [provider]  pantoprazole (PROTONIX) 40 MG tablet Take 1 tablet (40 mg total) by mouth daily. 07/23/23   Ronnald Nian, MD  Phentermine HCl (LOMAIRA) 8 MG TABS Take 1  tablet (8 mg total) by mouth daily. 09/07/23   Worthy Rancher, MD  valACYclovir (VALTREX) 500 MG tablet Take 1 tablet (500 mg total) by mouth daily. 07/23/23   Ronnald Nian, MD  zonisamide (ZONEGRAN) 100 MG capsule Take 100 mg by mouth 2 (two) times daily.    [provider]      Allergies    Iohexol, Ivp dye [iodinated contrast media], Codeine, and Onion    Review of Systems   Review of Systems  Physical Exam Updated Vital Signs BP (!) 134/92   Pulse 84   Temp 98.3 F (36.8 C) (Oral)   Resp 18   Ht 5' (1.524 m)   Wt 69.4 kg   LMP 06/23/1990 (Approximate)   SpO2 93%   BMI 29.88 kg/m  Physical  Exam Constitutional:      General: She is not in acute distress. HENT:     Head: Normocephalic and atraumatic.  Eyes:     Conjunctiva/sclera: Conjunctivae normal.     Pupils: Pupils are equal, round, and reactive to light.  Cardiovascular:     Rate and Rhythm: Normal rate and regular rhythm.  Pulmonary:     Effort: Pulmonary effort is normal. No respiratory distress.  Abdominal:     General: There is no distension.     Tenderness: There is no abdominal tenderness.  Musculoskeletal:     Right lower leg: Edema present.     Left lower leg: Edema present.  Skin:    General: Skin is warm and dry.  Neurological:     General: No focal deficit present.     Mental Status: She is alert. Mental status is at baseline.  Psychiatric:        Mood and Affect: Mood normal.        Behavior: Behavior normal.     ED Results / Procedures / Treatments   Labs (all labs ordered are listed, but only abnormal results are displayed) Labs Reviewed  BASIC METABOLIC PANEL WITH GFR - Abnormal; Notable for the following components:      Result Value   Glucose, Bld 111 (*)    All other components within normal limits  CBC - Abnormal; Notable for the following components:   RBC 5.16 (*)    All other components within normal limits  D-DIMER, QUANTITATIVE  TROPONIN I (HIGH SENSITIVITY)  TROPONIN I (HIGH SENSITIVITY)    EKG EKG Interpretation Date/Time:  Monday September 21 2023 15:34:16 EDT Ventricular Rate:  87 PR Interval:  167 QRS Duration:  96 QT Interval:  367 QTC Calculation: 442 R Axis:   36  Text Interpretation: Sinus rhythm Low voltage, precordial leads Confirmed by Alvester Chou [phone]) on 09/21/2023 4:52:20 PM  Radiology DG Chest 2 View Result Date: 09/21/2023 CLINICAL DATA:  Two episodes of left chest pain and shortness of breath starting this morning. EXAM: CHEST - 2 VIEW COMPARISON:  05/05/2021 FINDINGS: Normal sized heart. Clear lungs with normal vascularity. Unremarkable bones.  Cholecystectomy clips. IMPRESSION: No acute abnormality. Electronically Signed   By: Beckie Salts M.D.   On: 09/21/2023 17:02    Procedures Procedures    Medications Ordered in ED Medications - No data to display  ED Course/ Medical Decision Making/ A&P Clinical Course as of 09/21/23 1903  Mon Sep 21, 2023  1818 D-Dimer, Quant: <0.27 [MT]    Clinical Course User Index [MT] Terald Sleeper, MD  Medical Decision Making Amount and/or Complexity of Data Reviewed Labs: ordered. Decision-making details documented in ED Course. Radiology: ordered.   This patient presents to the ED with concern for chest pain and shoulder plain, now resolved. This involves an extensive number of treatment options, and is a complaint that carries with it a high risk of complications and morbidity.  The differential diagnosis includes musculoskeletal spasm versus ACS versus pneumothorax versus PE versus other  I do believe the patient is requiring a pulmonary embolism workup based on her history and the description of the symptoms.  I have a lower suspicion for large PE, with normal troponin, no hypotension, or evidence of heart strain on EKG.  Because the patient has anaphylaxis allergies to iodine, will need an alternative workup. I will perform a D-dimer screening, as well as DVT scanning of the bilateral legs.  Co-morbidities that complicate the patient evaluation: Prior history of DVT; cardiovascular risk factors including high blood pressure and high cholesterol  Additional history obtained from the patient's wife at bedside  External records from outside source obtained and reviewed including urgent care evaluation from today  I ordered and personally interpreted labs.  The pertinent results include: Basic labs, BMP and CBC unremarkable.  Troponin <2 and <2 on repeat Ddimer negative.  I ordered imaging studies including x-ray of the chest I independently  visualized and interpreted imaging which showed no emergent findings I agree with the radiologist interpretation  The patient was maintained on a cardiac monitor.  I personally viewed and interpreted the cardiac monitored which showed an underlying rhythm of: NSR  Per my interpretation the patient's ECG shows sinus rhythm no acute ischemic findings  I have reviewed the patients home medicines and have made adjustments as needed  Test Considered: With a negative D-dimer of a much lower suspicion for PE as well as DVT.  I no longer believe the patient requires a vascular ultrasound of her legs, she otherwise does not have symptoms of a DVT.  After the interventions noted above, I reevaluated the patient and found that they have: improved -patient has remained asymptomatic throughout her stay in the ED  Dispostion:  After consideration of the diagnostic results and the patients response to treatment, I feel that the patent would benefit from close outpatient follow-up.  I advised the patient again has transient episodes of chest pain I would suggest she make an appointment with a cardiologist further workup.  At this time, this isolated episode does not sound consistent with ACS         Final Clinical Impression(s) / ED Diagnoses Final diagnoses:  Chest pain, unspecified type    Rx / DC Orders ED Discharge Orders     None         Trai Ells, Kermit Balo, MD 09/21/23 [phone]

## 2023-09-21 NOTE — ED Triage Notes (Signed)
 Left sided shoulder blade pain that radiates into left side of chest that started this AM. Pt has had 2 episodes lasting 30sec-35minute. Pt states she was sob during the episode. Hx of DVT, not on blood thinners.

## 2023-09-21 NOTE — Discharge Instructions (Addendum)
 Please go to the ER for further workup of your symptoms

## 2023-09-21 NOTE — ED Triage Notes (Signed)
 Pt states that she got out of the shower this morning at 0730 and had an episode of feeling like she was being stabbed in the chest and it was hard to catch her breath. She states it happened again about 1420 this afternoon.

## 2023-09-21 NOTE — ED Provider Notes (Signed)
 [facility]    CSN: [id number] Arrival date & time: 09/21/23  1436      History   Chief Complaint No chief complaint on file.   HPI Carol [name] is a 73 y.o. female with a past medical history of hyperlipidemia, DVT, arthritis, anxiety, GERD, migraine who presents for chest pain.  Patient reports this morning she got out of the shower and had sudden onset of a sharp stabbing chest pain that originated at her mid back near her bra line and radiated through towards her chest.  Lasted about 30 seconds and then resolved on its own.  She did have shortness of breath with that at that time.  She experiences same symptoms at about 230 this afternoon while watching television.  Denies any dizziness, nausea/vomiting, syncope, palpitations.  Denies any history of CAD.  No smoking history.  Does have a history of a DVT in 2016 but no longer takes any blood thinning medications.  Denies any strenuous activity or cough cold symptoms.  No other concerns at this time.  HPI  Past Medical History:  Diagnosis Date   Abnormal Pap smear of cervix    in 20's   Anxiety    claustrophobic   Arthritis    "neck, hands" (09/02/2013)   Asthma    Basal cell carcinoma of forehead 2008; 2014   Dizziness last occurance 10-15-2014   on occasion, not related to activity   DVT (deep venous thrombosis) (HCC) 06/28/2014   Dr Susann Givens, right leg, behind knee   Environmental allergies    Exercise-induced asthma 30's   Gallbladder problem    GERD (gastroesophageal reflux disease)    H/O hiatal hernia    Herpes simplex 1976   History of colonic polyps 05/21/2016   History of kidney stones    Hx of blood clots    Hyperlipidemia    IBS (irritable bowel syndrome)    Joint pain    Kidney problem    Migraine teens, returned in 40"s   "depends on the change in the weather; last one was 1 wk ago" (09/02/2013)   Obesity    Osteoarthritis    Plantar fasciitis    PONV (postoperative nausea and  vomiting) 08/2013   ponv after gallblaffer   Prediabetes    Right ureteral stone    Seasonal allergic rhinitis    SOB (shortness of breath)    Tubular adenoma of colon 05/22/2017   per path report.    Tubular adenoma of colon 06/29/2020    Patient Active Problem List   Diagnosis Date Noted   Sleep difficulties 09/07/2023   Osteopenia 05/14/2023   Age-related osteoporosis with current pathological fracture 05/14/2023   Brittle nails 10/22/2022   Prediabetes 09/16/2022   Tremor 05/07/2021   Class 1 obesity with serious comorbidity and body mass index (BMI) of 31.0 to 31.9 in adult 05/07/2021   Thrombocytopenia (HCC) 05/04/2021   History of renal stone 05/24/2020   History of DVT of lower extremity 10/23/2017   Nocturnal muscle cramps 10/23/2017   History of colonic polyps 05/21/2016   S/P laparoscopic cholecystectomy March 2015 09/03/2013   Hyperlipidemia with target LDL less than 100 06/27/2011   Herpes 12/16/2010   GERD (gastroesophageal reflux disease) 12/16/2010   HH (hiatus hernia) 12/16/2010   Hot flashes, menopausal 12/16/2010   Diverticulosis 12/16/2010   Asthma in adult, mild intermittent, uncomplicated 12/16/2010   BCE (basal cell epithelioma), 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    Past Surgical History:  Procedure Laterality Date  ABDOMINAL HYSTERECTOMY  1992   CESAREAN SECTION  1981   CHOLECYSTECTOMY N/A 09/02/2013   Procedure: LAPAROSCOPIC CHOLECYSTECTOMY;  Surgeon: Shelly Rubenstein, MD;  Location: [facility];  Service: General;  Laterality: N/A;   CRYOTHERAPY     for abnormal pap smear in 20's   CYSTOSCOPY W/ URETERAL STENT PLACEMENT  03/05/2012   Procedure: CYSTOSCOPY WITH RETROGRADE PYELOGRAM/URETERAL STENT PLACEMENT;  Surgeon: Lindaann Slough, MD;  Location: [facility];  Service: Urology;  Laterality: Right;   CYSTOSCOPY W/ URETERAL STENT PLACEMENT Right 01/19/2015   Procedure: CYSTOSCOPY WITH RETROGRADE PYELOGRAM/URETERAL  RIGHT STENT PLACEMENT;  Surgeon:  Sebastian Ache, MD;  Location: [facility];  Service: Urology;  Laterality: Right;   CYSTOSCOPY WITH RETROGRADE PYELOGRAM, URETEROSCOPY AND STENT PLACEMENT Right 09/08/2014   Procedure: CYSTOSCOPY WITH RIGHT  RETROGRADE PYELOGRAM, URETEROSCOPY AND RIGHT STENT PLACEMENT;  Surgeon: Heloise Purpura, MD;  Location: [facility];  Service: Urology;  Laterality: Right;   CYSTOSCOPY WITH RETROGRADE PYELOGRAM, URETEROSCOPY AND STENT PLACEMENT Right 10/19/2014   Procedure: CYSTOSCOPY WITH Bilateral RETROGRADE PYELOGRAM/RIGHT URETEROSCOPY,RIGHT JJ STENT;  Surgeon: Jethro Bolus, MD;  Location: [facility];  Service: Urology;  Laterality: Right;   CYSTOSCOPY/RETROGRADE/URETEROSCOPY  11/18/2011   Procedure: CYSTOSCOPY/RETROGRADE/URETEROSCOPY;  Surgeon: Kathi Ludwig, MD;  Location: [facility];  Service: Urology;  Laterality: Right;   CYSTOSCOPY/RETROGRADE/URETEROSCOPY/STONE EXTRACTION WITH BASKET  10/21/2011   Procedure: CYSTOSCOPY/RETROGRADE/URETEROSCOPY/STONE EXTRACTION WITH BASKET;  Surgeon: Kathi Ludwig, MD;  Location: [facility];  Service: Urology;  Laterality: Right;   CYSTOSCOPY/RETROGRADE/URETEROSCOPY/STONE EXTRACTION WITH BASKET Right 09/10/2012   Procedure: RIGHT URETEROSCOPY/STONE EXTRACTION/LASER LITHOTRIPSY/POSSIBLE DOUBLE J STENT PLACEMENT;  Surgeon: Kathi Ludwig, MD;  Location: [facility];  Service: Urology;  Laterality: Right;   CYSTOSCOPY/URETEROSCOPY/HOLMIUM LASER/STENT PLACEMENT Left 05/02/2021   Procedure: CYSTOSCOPY/URETEROSCOPY/HOLMIUM LASER/STENT PLACEMENT;  Surgeon: Jerilee Field, MD;  Location: [facility];  Service: Urology;  Laterality: Left;   MOHS SURGERY  2008; 2014   PLANTAR FASCIA SURGERY  05/2016   POSTERIOR PINNACLE MESH SACROSPINOUS REPAIR  11-22-2010   RECURRENT ENTEROCELE   RIGHT URETEROSCOPIC STONE EXTRACTION W/ STENT PLACEMENT  08-12-2011   x2 stents   ROBOTICALLY ASSISTED LAPAROSCOPIC URETERAL RE-IMPLANTATION Right 01/19/2015   Procedure: ROBOTICALLY  ASSISTED LAPAROSCOPIC URETERAL RE-IMPLANTATION RIGHT;  Surgeon: Sebastian Ache, MD;  Location: [facility];  Service: Urology;  Laterality: Right;   TUBAL LIGATION  1991   VAULT SUSPENSION (DERMAL GRAFT)/ ENTEROCELE AND RECTOCELE REPAIR  12-05-2005   VAULT PROLAPSE/ ENTERCELE/ RECTOCELE    OB History     Gravida  3   Para  3   Term  3   Preterm  0   AB  0   Living  3      SAB  0   IAB  0   Ectopic  0   Multiple  0   Live Births  3            Home Medications    Prior to Admission medications   Medication Sig Start Date End Date Taking? Authorizing Provider  albuterol (PROAIR HFA) 108 (90 Base) MCG/ACT inhaler INHALE 2 PUFFS INTO THE LUNGS EVERY 6 HOURS AS NEEDED FOR WHEEZING/ SHORTNESS OF BREATH. 07/23/23   Ronnald Nian, MD  Apoaequorin (PREVAGEN) 10 MG CAPS Take by mouth in the morning.    [provider]  atorvastatin (LIPITOR) 40 MG tablet Take 1 tablet (40 mg total) by mouth daily. 07/24/23   Ronnald Nian, MD  azithromycin (ZITHROMAX) 500 MG tablet Take 1 tablet (500 mg  total) by mouth daily. 02/11/23   Ronnald Nian, MD  diclofenac sodium (VOLTAREN) 1 % GEL Apply 2 g topically 4 (four) times daily.    [provider]  DOXYCYCLINE HYCLATE PO Take 100 mg by mouth as needed.    [provider]  ibandronate (BONIVA) 150 MG tablet Take in the morning with a full glass of water once a month, on an empty stomach, do not take anything else by mouth or lie down for the next 30 min. 05/04/23   Ronnald Nian, MD  metFORMIN (GLUCOPHAGE-XR) 500 MG 24 hr tablet Take 1 tablet (500 mg total) by mouth 2 (two) times daily with a meal. 09/07/23   Worthy Rancher, MD  methocarbamol (ROBAXIN) 500 MG tablet Take 500 mg by mouth daily as needed (migraine). 05/31/18   [provider]  Multiple Vitamins-Minerals (CENTRUM SILVER 50+WOMEN PO) Take by mouth in the morning.    [provider]  NON FORMULARY Viviscal    [provider]  pantoprazole (PROTONIX) 40 MG tablet Take 1 tablet (40 mg total) by mouth daily. 07/23/23   Ronnald Nian, MD  Phentermine HCl (LOMAIRA) 8 MG TABS Take 1 tablet (8 mg total) by mouth daily. 09/07/23   Worthy Rancher, MD  valACYclovir (VALTREX) 500 MG tablet Take 1 tablet (500 mg total) by mouth daily. 07/23/23   Ronnald Nian, MD  zonisamide (ZONEGRAN) 100 MG capsule Take 100 mg by mouth 2 (two) times daily.    [provider]    Family History Family History  Problem Relation Age of Onset   High blood pressure Mother    High Cholesterol Mother    Cancer Father        bladder cancer    Social History Social History   Tobacco Use   Smoking status: Never   Smokeless tobacco: Never  Vaping Use   Vaping status: Never Used  Substance Use Topics   Alcohol use: Not Currently   Drug use: No     Allergies   Iohexol, Ivp dye [iodinated contrast media], Codeine, and Onion   Review of Systems Review of Systems  Respiratory:  Positive for shortness of breath.   Cardiovascular:  Positive for chest pain.     Physical Exam Triage Vital Signs ED Triage Vitals [09/21/23 1445]  Encounter Vitals Group     BP 132/84     Systolic BP Percentile      Diastolic BP Percentile      Pulse Rate 90     Resp 15     Temp (!) 97.5 F (36.4 C)     Temp Source Oral     SpO2 95 %     Weight      Height      Head Circumference      Peak Flow      Pain Score 0     Pain Loc      Pain Education      Exclude from Growth Chart    No data found.  Updated Vital Signs BP 132/84 (BP Location: Right Arm)   Pulse 90   Temp (!) 97.5 F (36.4 C) (Oral)   Resp 15   LMP 06/23/1990 (Approximate)   SpO2 95%   Visual Acuity Right Eye Distance:   Left Eye Distance:   Bilateral Distance:    Right Eye Near:   Left Eye Near:    Bilateral Near:     Physical Exam Vitals and nursing  note reviewed.  Constitutional:      General: She is not in acute distress.    Appearance:  Normal appearance. She is not ill-appearing.  HENT:     Head: Normocephalic and atraumatic.  Eyes:     Pupils: Pupils are equal, round, and reactive to light.  Cardiovascular:     Rate and Rhythm: Normal rate and regular rhythm.     Heart sounds: Normal heart sounds.  Pulmonary:     Effort: Pulmonary effort is normal.     Breath sounds: Normal breath sounds.  Chest:     Chest wall: No tenderness.  Musculoskeletal:     Thoracic back: Normal.  Skin:    General: Skin is warm and dry.  Neurological:     General: No focal deficit present.     Mental Status: She is alert and oriented to person, place, and time.  Psychiatric:        Mood and Affect: Mood normal.        Behavior: Behavior normal.      UC Treatments / Results  Labs (all labs ordered are listed, but only abnormal results are displayed) Labs Reviewed - No data to display  EKG   Radiology No results found.  Procedures ED EKG  Date/Time: 09/21/2023 3:04 PM  Performed by: Radford Pax, NP Authorized by: Radford Pax, NP   ECG interpreted by ED Physician in the absence of a cardiologist: yes   Previous ECG:    Previous ECG:  Compared to current   Similarity:  No change Interpretation:    Interpretation: normal   Rate:    ECG rate:  87   ECG rate assessment: normal   Rhythm:    Rhythm: sinus rhythm   Ectopy:    Ectopy: none   QRS:    QRS axis:  Normal ST segments:    ST segments:  Normal T waves:    T waves: normal    (including critical care time)  Medications Ordered in UC Medications - No data to display  Initial Impression / Assessment and Plan / UC Course  I have reviewed the triage vital signs and the nursing notes.  Pertinent labs & imaging results that were available during my care of the patient were reviewed by me and considered in my medical decision making (see chart for details).     Reviewed exam and symptoms with patient.  No red flags.  Given patient medical history, age,  and symptoms I did advise she go to the ER to have further workup including ruling out PE/MI.  Patient is in agreement with plan will go POV to the emergency room.  She denies any chest pain at time of evaluation. Final Clinical Impressions(s) / UC Diagnoses   Final diagnoses:  Chest pain, unspecified type     Discharge Instructions      Please go to the ER for further workup of your symptoms    ED Prescriptions   None    PDMP not reviewed this encounter.   Radford Pax, NP 09/21/23 [phone]

## 2023-09-22 IMAGING — MG MM DIGITAL SCREENING BILAT W/ TOMO AND CAD
8 series · 8 of 24 positions shown · non-contrast
Comparison: Previous exam(s).

CLINICAL DATA: Screening.

EXAM:
DIGITAL SCREENING BILATERAL MAMMOGRAM WITH TOMOSYNTHESIS AND CAD
TECHNIQUE: Bilateral screening digital craniocaudal and mediolateral oblique
mammograms were obtained. Bilateral screening digital breast
tomosynthesis was performed. The images were evaluated with
computer-aided detection.

[R CC synth-2D]
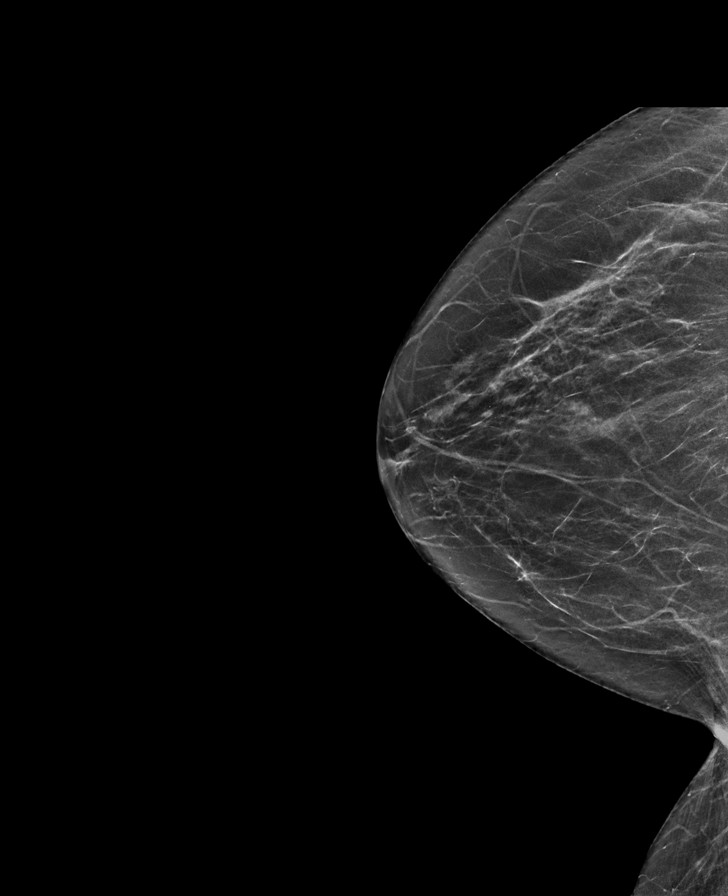

[R MLO synth-2D]
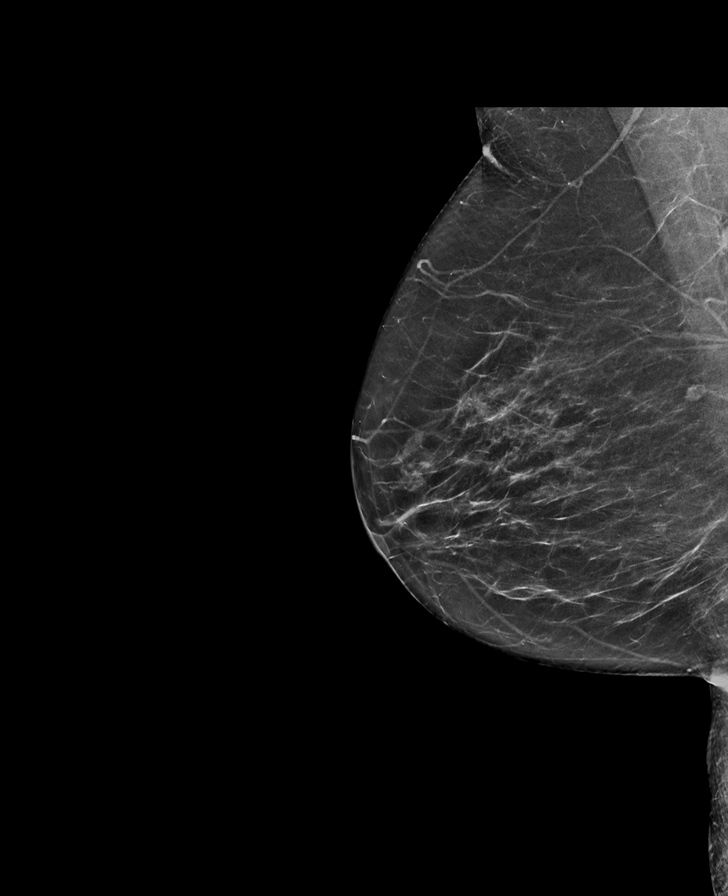

[L CC synth-2D]
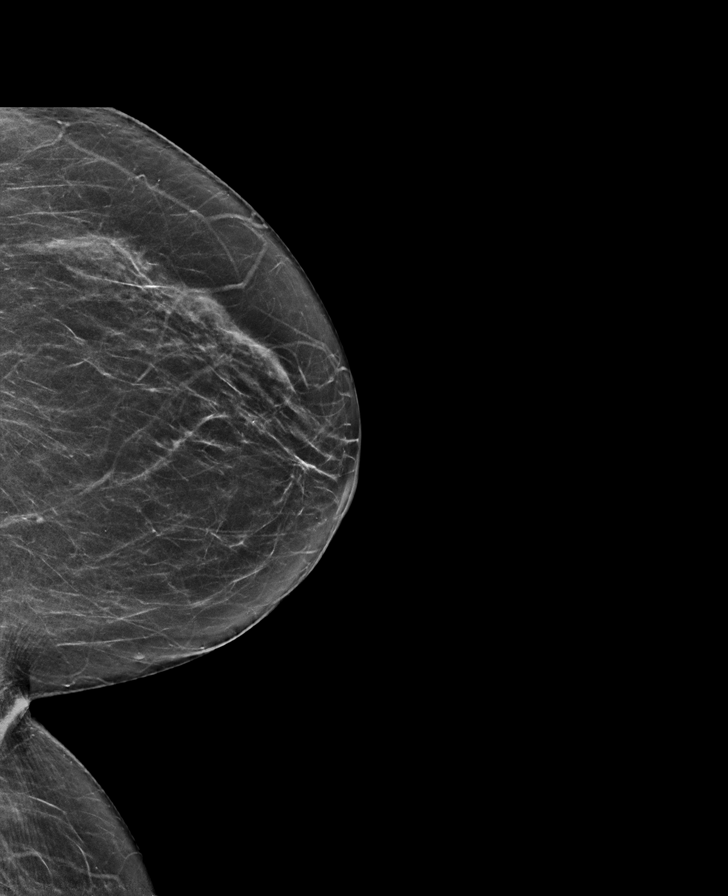

[L MLO synth-2D]
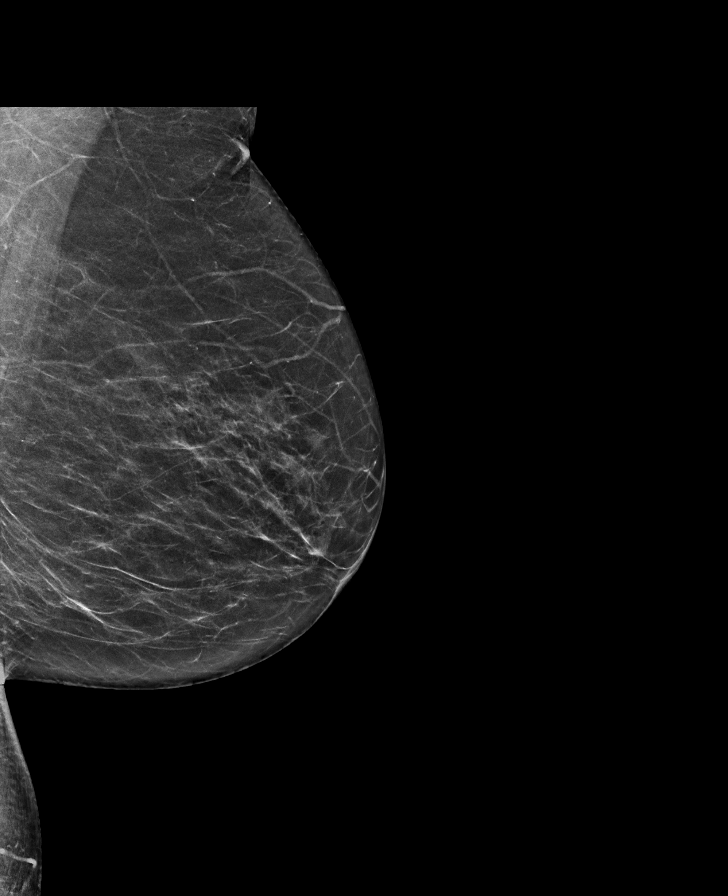

[R MLO tomo · tomo slice 32/63.0]
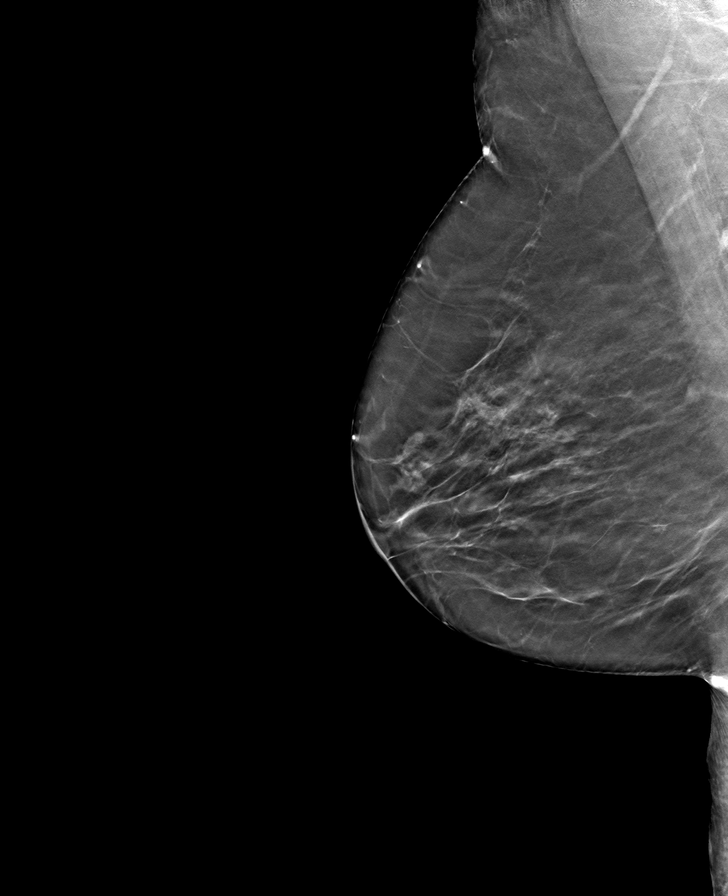

[R CC tomo · tomo slice 29/58.0]
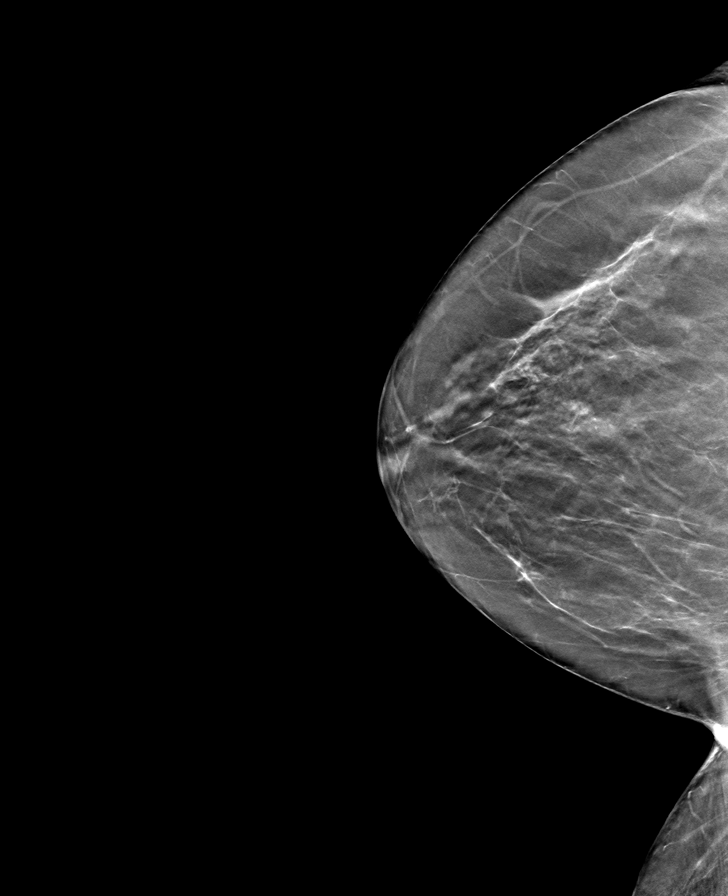

[L CC tomo · tomo slice 31/60.0]
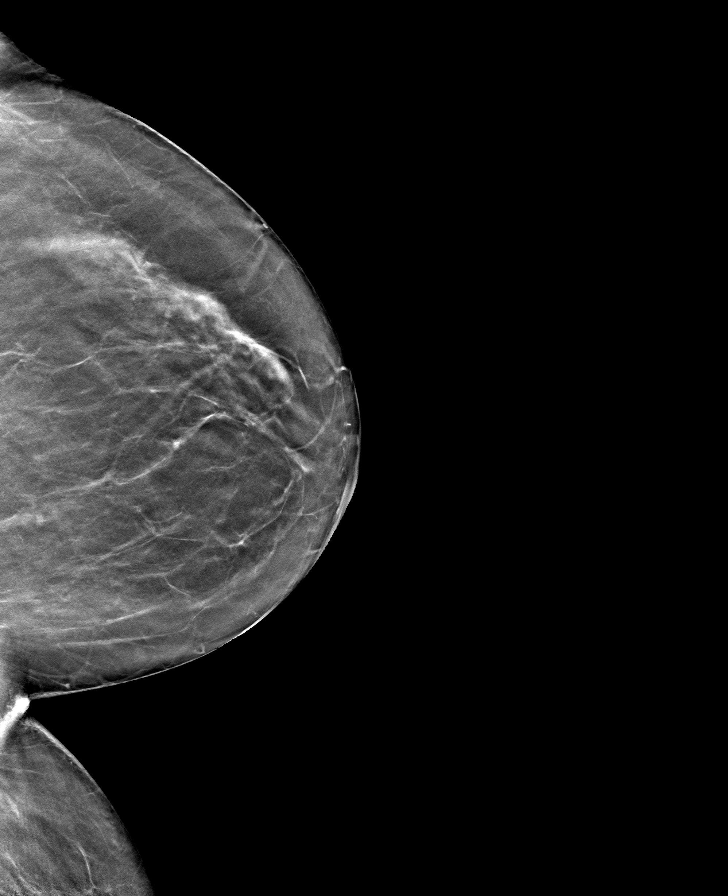

[L MLO tomo · tomo slice 31/62.0]
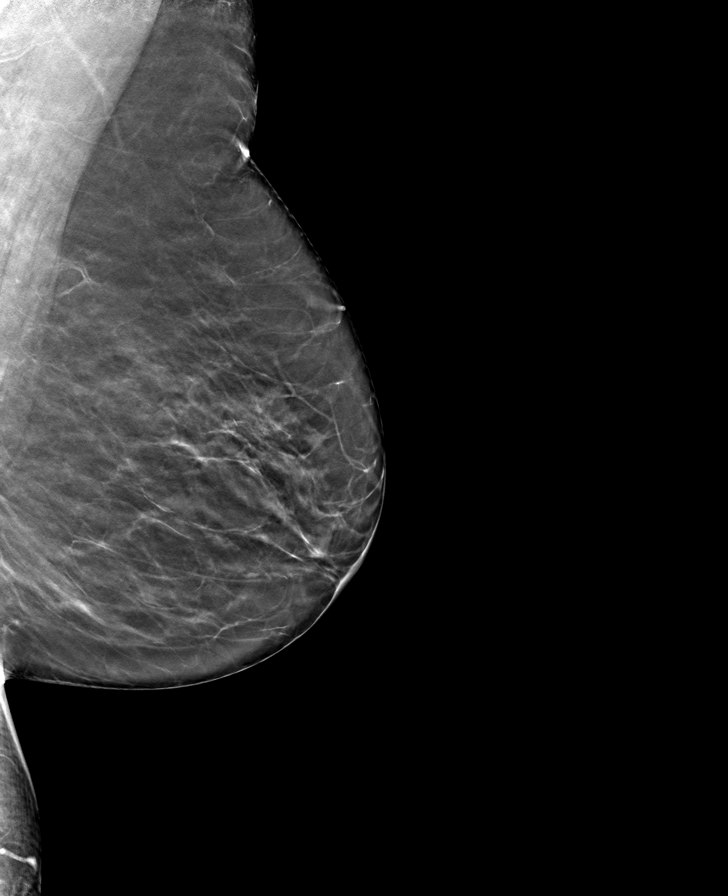

[8 of 24 positions shown; findings below may reference images not displayed]

ACR Breast Density Category b: There are scattered areas of
fibroglandular density.
FINDINGS: There are no findings suspicious for malignancy.
IMPRESSION: No mammographic evidence of malignancy. A result letter of this
screening mammogram will be mailed directly to the patient.

RECOMMENDATION:
Screening mammogram in one year. (Code:51-O-LD2)

BI-RADS CATEGORY  1: Negative.

## 2023-09-23 DIAGNOSIS — E119 Type 2 diabetes mellitus without complications: Secondary | ICD-10-CM | POA: Diagnosis not present

## 2023-09-23 DIAGNOSIS — H35033 Hypertensive retinopathy, bilateral: Secondary | ICD-10-CM | POA: Diagnosis not present

## 2023-09-23 DIAGNOSIS — H43393 Other vitreous opacities, bilateral: Secondary | ICD-10-CM | POA: Diagnosis not present

## 2023-09-23 DIAGNOSIS — D3132 Benign neoplasm of left choroid: Secondary | ICD-10-CM | POA: Diagnosis not present

## 2023-09-23 LAB — HM DIABETES EYE EXAM

## 2023-09-30 ENCOUNTER — Ambulatory Visit (INDEPENDENT_AMBULATORY_CARE_PROVIDER_SITE_OTHER): Admitting: Internal Medicine

## 2023-09-30 ENCOUNTER — Encounter (INDEPENDENT_AMBULATORY_CARE_PROVIDER_SITE_OTHER): Payer: Self-pay | Admitting: Internal Medicine

## 2023-09-30 VITALS — BP 106/93 | HR 88 | Temp 97.7°F | Ht 60.0 in | Wt 153.0 lb

## 2023-09-30 DIAGNOSIS — E66811 Obesity, class 1: Secondary | ICD-10-CM | POA: Diagnosis not present

## 2023-09-30 DIAGNOSIS — R7303 Prediabetes: Secondary | ICD-10-CM | POA: Diagnosis not present

## 2023-09-30 DIAGNOSIS — Z6831 Body mass index (BMI) 31.0-31.9, adult: Secondary | ICD-10-CM

## 2023-09-30 MED ORDER — LOMAIRA 8 MG PO TABS
8.0000 mg | ORAL_TABLET | Freq: Every day | ORAL | 0 refills | Status: DC
Start: 2023-09-30 — End: 2023-10-27

## 2023-09-30 NOTE — Progress Notes (Signed)
 Office: [phone]  /  Fax: [phone]  Weight Summary And Biometrics  Vitals Temp: 97.7 F (36.5 C) BP: (!) 106/93 Pulse Rate: 88 SpO2: 97 %   Anthropometric Measurements Height: 5' (1.524 m) Weight: 153 lb (69.4 kg) BMI (Calculated): 29.88 Weight at Last Visit: 153 lb Weight Lost Since Last Visit: 0 lb Weight Gained Since Last Visit: 0 lb Starting Weight: 159 lb Total Weight Loss (lbs): 6 lb (2.722 kg) Peak Weight: 175 lb   Body Composition  Body Fat %: 41.7 % Fat Mass (lbs): 63.8 lbs Muscle Mass (lbs): 84.8 lbs Total Body Water (lbs): 59 lbs Visceral Fat Rating : 12    No data recorded Today's Visit #: 14  Starting Date: 09/16/22   Subjective   Chief Complaint: Obesity  Interval History Discussed the use of AI scribe software for clinical note transcription with the patient, who gave verbal consent to proceed.  History of Present Illness Carol [name] "Carol [name]" is a 73 year old female who presents for medical weight management.  She has maintained her weight since the last visit and follows a 1200 calorie meal plan approximately 70% of the time, incorporating more whole foods into her diet. She feels she is not getting the recommended amount of protein but maintains adequate hydration and does not skip meals.  She has not been taking Lomaira due to insurance denial, as Medicare does not cover anti-obesity medications. She has not received further communication from the pharmacy or her healthcare provider regarding this issue and has not been provided with a price for the medication without insurance coverage.  Her current diet includes Kodi[place] oatmeal with chia seeds for breakfast, providing approximately 23 grams of protein. Lunch typically consists of a [place] sandwich or a salad with [place] slices from the deli, accompanied by an apple. She occasionally considers adding [place] yogurt or cottage cheese to increase her protein intake.  She  exercises two days a week for about 30 minutes, engaging in strengthening, cardio, and yoga. She has recently started Pilates, attending sessions at [employer], and enjoys the activity. The cost is $330 for ten one-on-one sessions.  She reports inadequate sleep but low levels of stress.    Challenges affecting patient progress: none.    Pharmacotherapy for weight management: She is currently taking Metformin (off label use for incretin effect and / or insulin resistance and / or diabetes prevention) with adequate clinical response  and without side effects..   Assessment and Plan   Treatment Plan For Obesity:  Recommended Dietary Goals  Carol [name] is currently in the action stage of change. As such, her goal is to continue weight management plan. She has agreed to: follow the Category 1 plan - 1000 kcal per day  Behavioral Health and Counseling  We discussed the following behavioral modification strategies today: increasing lean protein intake to established goals.  Additional education and resources provided today: None  Recommended Physical Activity Goals  Carol [name] has been advised to work up to 150 minutes of moderate intensity aerobic activity a week and strengthening exercises 2-3 times per week for cardiovascular health, weight loss maintenance and preservation of muscle mass.   She has agreed to :  continue to gradually increase the amount and intensity of exercise routine  Pharmacotherapy  We discussed various medication options to help Carol [name] with her weight loss efforts and we both agreed to :  Start Lomaira 4 mg in the morning for 1 week and then 8 mg daily  Associated  Conditions Impacted by Obesity Treatment  Prediabetes Assessment & Plan:  Most recent A1c was 5.8 and stable.  She is currently on metformin XR 500 mg twice a day and will continue medication for pharmacoprophylaxis.  Continue current weight management strategy   Class 1 obesity with serious comorbidity  and body mass index (BMI) of 31.0 to 31.9 in adult, unspecified obesity type Assessment & Plan: She is seeking medical weight management, adhering to a 1200 calorie meal plan 70% of the time, and incorporating more whole foods. Reports inadequate protein intake and exercises twice weekly for 30 minutes. Lomaira not taken due to insurance denial; discussed using RxSaver for cost reduction. Potential benefits of Lomaira at a low dose include appetite control without affecting heart rate and blood pressure. Advised to start with half a tablet and adjust as needed. Discussed intermittent fasting and meal replacement to enhance weight loss. Provided a 1000 calorie meal plan to create a calorie deficit. - Prescribe Lomaira, start with half a tablet in the morning on an empty stomach before breakfast. - Use RxSaver app to find the best price for Lomaira at [employer]. - Consider starting a 16:8 intermittent fasting regimen. - Consider using a meal replacement protein shake for breakfast on some days. - Follow a 1000 calorie meal plan to create a calorie deficit. - Reassess in 4 weeks.  Orders: Carol [name]; Take 1 tablet (8 mg total) by mouth daily.  Dispense: 28 tablet; Refill: 0          Objective   Physical Exam:  Blood pressure (!) 106/93, pulse 88, temperature 97.7 F (36.5 C), height 5' (1.524 m), weight 153 lb (69.4 kg), last menstrual period 06/23/1990, SpO2 97%. Body mass index is 29.88 kg/m.  General: She is overweight, cooperative, alert, well developed, and in no acute distress. PSYCH: Has normal mood, affect and thought process.   HEENT: EOMI, sclerae are anicteric. Lungs: Normal breathing effort, no conversational dyspnea. Extremities: No edema.  Neurologic: No gross sensory or motor deficits. No tremors or fasciculations noted.    Diagnostic Data Reviewed:  BMET    Component Value Date/Time   NA 140 09/21/2023 1552   NA 141 07/23/2023 1133   K 4.3 09/21/2023  1552   CL 105 09/21/2023 1552   CO2 26 09/21/2023 1552   GLUCOSE 111 (H) 09/21/2023 1552   BUN 21 09/21/2023 1552   BUN 16 07/23/2023 1133   CREATININE 0.89 09/21/2023 1552   CREATININE 0.76 03/16/2017 1354   CALCIUM 9.7 09/21/2023 1552   GFRNONAA >60 09/21/2023 1552   GFRAA 79 10/21/2019 1326   Lab Results  Component Value Date   HGBA1C 5.8 (A) 07/23/2023   HGBA1C 5.6 02/22/2013   Lab Results  Component Value Date   INSULIN 11.1 09/16/2022   Lab Results  Component Value Date   TSH 2.380 08/18/2022   CBC    Component Value Date/Time   WBC 8.6 09/21/2023 1552   RBC 5.16 (H) 09/21/2023 1552   HGB 14.4 09/21/2023 1552   HGB 14.3 07/23/2023 1133   HGB 14.3 05/21/2016 1128   HCT 45.3 09/21/2023 1552   HCT 44.1 07/23/2023 1133   PLT 302 09/21/2023 1552   PLT 313 07/23/2023 1133   MCV 87.8 09/21/2023 1552   MCV 86 07/23/2023 1133   MCH 27.9 09/21/2023 1552   MCHC 31.8 09/21/2023 1552   RDW 13.2 09/21/2023 1552   RDW 13.6 07/23/2023 1133   Iron Studies  Component Value Date/Time   IRON 47 10/22/2022 1430   TIBC 323 10/22/2022 1430   FERRITIN 166 (H) 10/22/2022 1430   IRONPCTSAT 15 10/22/2022 1430   Lipid Panel     Component Value Date/Time   CHOL 190 07/23/2023 1133   TRIG 163 (H) 07/23/2023 1133   HDL 56 07/23/2023 1133   CHOLHDL 3.4 07/23/2023 1133   CHOLHDL 3.2 05/21/2016 1407   VLDL 44 (H) 05/21/2016 1407   LDLCALC 106 (H) 07/23/2023 1133   Hepatic Function Panel     Component Value Date/Time   PROT 6.8 07/23/2023 1133   ALBUMIN 4.5 07/23/2023 1133   AST 23 07/23/2023 1133   ALT 19 07/23/2023 1133   ALKPHOS 85 07/23/2023 1133   BILITOT 0.2 07/23/2023 1133      Component Value Date/Time   TSH 2.380 08/18/2022 1310   Nutritional Lab Results  Component Value Date   VD25OH 51.2 04/23/2022   VD25OH 39.4 05/27/2017   VD25OH 31 05/21/2016    Medications: Outpatient Encounter Medications as of 09/30/2023  Medication Sig   albuterol (PROAIR  HFA) 108 (90 Base) MCG/ACT inhaler INHALE 2 PUFFS INTO THE LUNGS EVERY 6 HOURS AS NEEDED FOR WHEEZING/ SHORTNESS OF BREATH.   Apoaequorin (PREVAGEN) 10 MG CAPS Take by mouth in the morning.   atorvastatin (LIPITOR) 40 MG tablet Take 1 tablet (40 mg total) by mouth daily.   azithromycin (ZITHROMAX) 500 MG tablet Take 1 tablet (500 mg total) by mouth daily.   diclofenac sodium (VOLTAREN) 1 % GEL Apply 2 g topically 4 (four) times daily.   DOXYCYCLINE HYCLATE PO Take 100 mg by mouth as needed.   ibandronate (BONIVA) 150 MG tablet Take in the morning with a full glass of water once a month, on an empty stomach, do not take anything else by mouth or lie down for the next 30 min.   metFORMIN (GLUCOPHAGE-XR) 500 MG 24 hr tablet Take 1 tablet (500 mg total) by mouth 2 (two) times daily with a meal.   methocarbamol (ROBAXIN) 500 MG tablet Take 500 mg by mouth daily as needed (migraine).   Multiple Vitamins-Minerals (CENTRUM SILVER 50+WOMEN PO) Take by mouth in the morning.   NON FORMULARY Viviscal   pantoprazole (PROTONIX) 40 MG tablet Take 1 tablet (40 mg total) by mouth daily.   valACYclovir (VALTREX) 500 MG tablet Take 1 tablet (500 mg total) by mouth daily.   zonisamide (ZONEGRAN) 100 MG capsule Take 100 mg by mouth 2 (two) times daily.   Phentermine HCl (LOMAIRA) 8 MG TABS Take 1 tablet (8 mg total) by mouth daily.   [DISCONTINUED] Phentermine HCl (LOMAIRA) 8 MG TABS Take 1 tablet (8 mg total) by mouth daily.   No facility-administered encounter medications on file as of 09/30/2023.     Follow-Up   Return in about 4 weeks (around 10/28/2023) for For Weight Mangement with Dr. Rikki Spearing.Marland Kitchen She was informed of the importance of frequent follow up visits to maximize her success with intensive lifestyle modifications for her multiple health conditions.  Attestation Statement   Reviewed by clinician on day of visit: allergies, medications, problem list, medical history, surgical history, family history,  social history, and previous encounter notes.     Worthy Rancher, MD

## 2023-09-30 NOTE — Assessment & Plan Note (Signed)
  Most recent A1c was 5.8 and stable.  She is currently on metformin XR 500 mg twice a day and will continue medication for pharmacoprophylaxis.  Continue current weight management strategy

## 2023-09-30 NOTE — Assessment & Plan Note (Signed)
 She is seeking medical weight management, adhering to a 1200 calorie meal plan 70% of the time, and incorporating more whole foods. Reports inadequate protein intake and exercises twice weekly for 30 minutes. Lomaira not taken due to insurance denial; discussed using RxSaver for cost reduction. Potential benefits of Lomaira at a low dose include appetite control without affecting heart rate and blood pressure. Advised to start with half a tablet and adjust as needed. Discussed intermittent fasting and meal replacement to enhance weight loss. Provided a 1000 calorie meal plan to create a calorie deficit. - Prescribe Lomaira, start with half a tablet in the morning on an empty stomach before breakfast. - Use RxSaver app to find the best price for Lomaira at [employer]. - Consider starting a 16:8 intermittent fasting regimen. - Consider using a meal replacement protein shake for breakfast on some days. - Follow a 1000 calorie meal plan to create a calorie deficit. - Reassess in 4 weeks.

## 2023-10-06 DIAGNOSIS — R34 Anuria and oliguria: Secondary | ICD-10-CM | POA: Diagnosis not present

## 2023-10-06 DIAGNOSIS — N202 Calculus of kidney with calculus of ureter: Secondary | ICD-10-CM | POA: Diagnosis not present

## 2023-10-06 DIAGNOSIS — N3020 Other chronic cystitis without hematuria: Secondary | ICD-10-CM | POA: Diagnosis not present

## 2023-10-27 ENCOUNTER — Encounter (INDEPENDENT_AMBULATORY_CARE_PROVIDER_SITE_OTHER): Payer: Self-pay | Admitting: Internal Medicine

## 2023-10-27 ENCOUNTER — Ambulatory Visit (INDEPENDENT_AMBULATORY_CARE_PROVIDER_SITE_OTHER): Admitting: Internal Medicine

## 2023-10-27 VITALS — BP 114/75 | HR 90 | Temp 97.7°F | Ht 60.0 in | Wt 151.0 lb

## 2023-10-27 DIAGNOSIS — Z6831 Body mass index (BMI) 31.0-31.9, adult: Secondary | ICD-10-CM

## 2023-10-27 DIAGNOSIS — E785 Hyperlipidemia, unspecified: Secondary | ICD-10-CM

## 2023-10-27 DIAGNOSIS — R7303 Prediabetes: Secondary | ICD-10-CM

## 2023-10-27 DIAGNOSIS — E66811 Obesity, class 1: Secondary | ICD-10-CM

## 2023-10-27 MED ORDER — LOMAIRA 8 MG PO TABS: 8.0000 mg | ORAL_TABLET | Freq: Every day | ORAL | 1 refills | Status: DC

## 2023-10-27 NOTE — Assessment & Plan Note (Signed)
 LDL is not at goal. Elevated LDL may be secondary to nutrition, genetics and spillover effect from excess adiposity. Recommended LDL goal is <70 to reduce the risk of fatty streaks and the progression to obstructive ASCVD in the future.   Her 10 year risk is: The 10-year ASCVD risk score (Arnett DK, et al., 2019) is: 7.5%  Lab Results  Component Value Date   CHOL 190 07/23/2023   HDL 56 07/23/2023   LDLCALC 106 (H) 07/23/2023   TRIG 163 (H) 07/23/2023   CHOLHDL 3.4 07/23/2023   She is currently on atorvastatin  40 mg daily.  To reduce cardiovascular risk further I suggest possibly switching to rosuvastatin for an LDL cholesterol goal closer to 70.  I will review this with Dr. Robina Chol

## 2023-10-27 NOTE — Assessment & Plan Note (Signed)
 Carol [name] has overcome recent plateau since starting Lomaira  8 mg once a day.  She is tolerating medication well without any adverse effect.  She is also increasing volume of physical activity and has good adherence to reduced calorie nutrition plan.  She is also on metformin  for diabetes prevention and weight management.  She will continue with current weight management strategy

## 2023-10-27 NOTE — Progress Notes (Signed)
 Office: [phone]  /  Fax: [phone]  Weight Summary And Biometrics  Vitals Temp: 97.7 F (36.5 C) BP: 114/75 Pulse Rate: 90 SpO2: 97 %   Anthropometric Measurements Height: 5' (1.524 m) Weight: 151 lb (68.5 kg) BMI (Calculated): 29.49 Weight at Last Visit: 153 lb Weight Lost Since Last Visit: 2 lb Weight Gained Since Last Visit: 0 lb Starting Weight: 159 lb Total Weight Loss (lbs): 8 lb (3.629 kg) Peak Weight: 175 lb   Body Composition  Body Fat %: 40.4 % Fat Mass (lbs): 61.2 lbs Muscle Mass (lbs): 95.8 lbs Total Body Water  (lbs): 57.4 lbs Visceral Fat Rating : 11  The 10-year ASCVD risk score (Arnett DK, et al., 2019) is: 7.5%   Values used to calculate the score:     Age: 50 years     Sex: Female     Is Non-Hispanic African American: No     Diabetic: No     Tobacco smoker: No     Systolic Blood Pressure: 114 mmHg     Is BP treated: No     HDL Cholesterol: 56 mg/dL     Total Cholesterol: 190 mg/dL   RMR: 9629  Today's Visit #: 15  Starting Date: 09/16/22   Subjective   Chief Complaint: Obesity  Interval History Discussed the use of AI scribe software for clinical note transcription with the patient, who gave verbal consent to proceed.  History of Present Illness   Carol [name] "Carol [name]" is a 73 year old female who presents for medical weight management.  She has lost two pounds since her last office visit. She adheres to a 1000 calorie meal plan approximately 85% of the time, focusing on whole foods, adequate protein intake, and hydration without skipping meals. She exercises three days a week, incorporating 30 minutes of strength training, cardio, and yoga. She attempted a 16:8 intermittent fasting schedule but found it difficult due to her morning routine.  She is currently taking phentermine , starting with half a tablet initially but now taking a whole tablet daily. No palpitations or jitteriness are noted. She experiences a slight  decrease in hunger and feels fuller quicker, with an increase in energy levels in the morning.  Her asthma is well-controlled, using inhalers only as needed, with two uses during high pollen exposure while working in the yard. She does not use inhalers regularly.  Her blood work from January showed an A1c of 5.8, down from 6.1 in November. Her cholesterol was 106, with triglycerides and LDL being borderline. She is currently taking atorvastatin  40 mg. There is no family history of heart disease or stroke at a young age, though her mother had high cholesterol.       Challenges affecting patient progress: none.    Pharmacotherapy for weight management: She is currently taking Metformin  (off label use for incretin effect and / or insulin  resistance and / or diabetes prevention) with adequate clinical response  and without side effects. and Phentermine  (longterm use, single agent)  with adequate clinical response  and without side effects..   Assessment and Plan   Treatment Plan For Obesity:  Recommended Dietary Goals  Carol [name] is currently in the action stage of change. As such, her goal is to continue weight management plan. She has agreed to: continue current plan  Behavioral Health and Counseling  We discussed the following behavioral modification strategies today: continue to work on maintaining a reduced calorie state, getting the recommended amount of protein, incorporating whole foods, making  healthy choices, staying well hydrated and practicing mindfulness when eating..  Additional education and resources provided today: None  Recommended Physical Activity Goals  Carol [name] has been advised to work up to 150 minutes of moderate intensity aerobic activity a week and strengthening exercises 2-3 times per week for cardiovascular health, weight loss maintenance and preservation of muscle mass.   She has agreed to :  continue to gradually increase the amount and intensity of exercise  routine  Pharmacotherapy  We discussed various medication options to help Carol [name] with her weight loss efforts and we both agreed to : adequate clinical response to anti-obesity medication, continue current regimen  Associated Conditions Impacted by Obesity Treatment  Prediabetes Assessment & Plan:  Most recent A1c was 5.8 and stable.  She is currently on metformin  XR 500 mg twice a day and will continue medication for pharmacoprophylaxis.  Check hemoglobin A1c at 6 months with B12   Class 1 obesity with serious comorbidity and body mass index (BMI) of 31.0 to 31.9 in adult, unspecified obesity type Assessment & Plan: Carol [name] has overcome recent plateau since starting Lomaira  8 mg once a day.  She is tolerating medication well without any adverse effect.  She is also increasing volume of physical activity and has good adherence to reduced calorie nutrition plan.  She is also on metformin  for diabetes prevention and weight management.  She will continue with current weight management strategy  Orders: -     Lomaira ; Take 1 tablet (8 mg total) by mouth daily.  Dispense: 28 tablet; Refill: 1  Hyperlipidemia with target LDL less than 100 Assessment & Plan: LDL is not at goal. Elevated LDL may be secondary to nutrition, genetics and spillover effect from excess adiposity. Recommended LDL goal is <70 to reduce the risk of fatty streaks and the progression to obstructive ASCVD in the future.   Her 10 year risk is: The 10-year ASCVD risk score (Arnett DK, et al., 2019) is: 7.5%  Lab Results  Component Value Date   CHOL 190 07/23/2023   HDL 56 07/23/2023   LDLCALC 106 (H) 07/23/2023   TRIG 163 (H) 07/23/2023   CHOLHDL 3.4 07/23/2023   She is currently on atorvastatin  40 mg daily.  To reduce cardiovascular risk further I suggest possibly switching to rosuvastatin for an LDL cholesterol goal closer to 70.  I will review this with Dr. Robina Chol          Objective   Physical  Exam:  Blood pressure 114/75, pulse 90, temperature 97.7 F (36.5 C), height 5' (1.524 m), weight 151 lb (68.5 kg), last menstrual period 06/23/1990, SpO2 97%. Body mass index is 29.49 kg/m.  General: She is overweight, cooperative, alert, well developed, and in no acute distress. PSYCH: Has normal mood, affect and thought process.   HEENT: EOMI, sclerae are anicteric. Lungs: Normal breathing effort, no conversational dyspnea. Extremities: No edema.  Neurologic: No gross sensory or motor deficits. No tremors or fasciculations noted.    Diagnostic Data Reviewed:  BMET    Component Value Date/Time   NA 140 09/21/2023 1552   NA 141 07/23/2023 1133   K 4.3 09/21/2023 1552   CL 105 09/21/2023 1552   CO2 26 09/21/2023 1552   GLUCOSE 111 (H) 09/21/2023 1552   BUN 21 09/21/2023 1552   BUN 16 07/23/2023 1133   CREATININE 0.89 09/21/2023 1552   CREATININE 0.76 03/16/2017 1354   CALCIUM  9.7 09/21/2023 1552   GFRNONAA >60 09/21/2023 1552   GFRAA 79 10/21/2019  1326   Lab Results  Component Value Date   HGBA1C 5.8 (A) 07/23/2023   HGBA1C 5.6 02/22/2013   Lab Results  Component Value Date   INSULIN  11.1 09/16/2022   Lab Results  Component Value Date   TSH 2.380 08/18/2022   CBC    Component Value Date/Time   WBC 8.6 09/21/2023 1552   RBC 5.16 (H) 09/21/2023 1552   HGB 14.4 09/21/2023 1552   HGB 14.3 07/23/2023 1133   HGB 14.3 05/21/2016 1128   HCT 45.3 09/21/2023 1552   HCT 44.1 07/23/2023 1133   PLT 302 09/21/2023 1552   PLT 313 07/23/2023 1133   MCV 87.8 09/21/2023 1552   MCV 86 07/23/2023 1133   MCH 27.9 09/21/2023 1552   MCHC 31.8 09/21/2023 1552   RDW 13.2 09/21/2023 1552   RDW 13.6 07/23/2023 1133   Iron Studies    Component Value Date/Time   IRON 47 10/22/2022 1430   TIBC 323 10/22/2022 1430   FERRITIN 166 (H) 10/22/2022 1430   IRONPCTSAT 15 10/22/2022 1430   Lipid Panel     Component Value Date/Time   CHOL 190 07/23/2023 1133   TRIG 163 (H)  07/23/2023 1133   HDL 56 07/23/2023 1133   CHOLHDL 3.4 07/23/2023 1133   CHOLHDL 3.2 05/21/2016 1407   VLDL 44 (H) 05/21/2016 1407   LDLCALC 106 (H) 07/23/2023 1133   Hepatic Function Panel     Component Value Date/Time   PROT 6.8 07/23/2023 1133   ALBUMIN  4.5 07/23/2023 1133   AST 23 07/23/2023 1133   ALT 19 07/23/2023 1133   ALKPHOS 85 07/23/2023 1133   BILITOT 0.2 07/23/2023 1133      Component Value Date/Time   TSH 2.380 08/18/2022 1310   Nutritional Lab Results  Component Value Date   VD25OH 51.2 04/23/2022   VD25OH 39.4 05/27/2017   VD25OH 31 05/21/2016    Medications: Outpatient Encounter Medications as of 10/27/2023  Medication Sig   albuterol  (PROAIR  HFA) 108 (90 Base) MCG/ACT inhaler INHALE 2 PUFFS INTO THE LUNGS EVERY 6 HOURS AS NEEDED FOR WHEEZING/ SHORTNESS OF BREATH.   Apoaequorin (PREVAGEN) 10 MG CAPS Take by mouth in the morning.   atorvastatin  (LIPITOR) 40 MG tablet Take 1 tablet (40 mg total) by mouth daily.   diclofenac sodium (VOLTAREN) 1 % GEL Apply 2 g topically 4 (four) times daily.   DOXYCYCLINE  HYCLATE PO Take 100 mg by mouth as needed.   ibandronate  (BONIVA ) 150 MG tablet Take in the morning with a full glass of water  once a month, on an empty stomach, do not take anything else by mouth or lie down for the next 30 min.   metFORMIN  (GLUCOPHAGE -XR) 500 MG 24 hr tablet Take 1 tablet (500 mg total) by mouth 2 (two) times daily with a meal.   methocarbamol (ROBAXIN) 500 MG tablet Take 500 mg by mouth daily as needed (migraine).   Multiple Vitamins-Minerals (CENTRUM SILVER 50+WOMEN PO) Take by mouth in the morning.   NON FORMULARY Viviscal   pantoprazole  (PROTONIX ) 40 MG tablet Take 1 tablet (40 mg total) by mouth daily.   valACYclovir  (VALTREX ) 500 MG tablet Take 1 tablet (500 mg total) by mouth daily.   zonisamide  (ZONEGRAN ) 100 MG capsule Take 100 mg by mouth 2 (two) times daily.   [DISCONTINUED] Phentermine  HCl (LOMAIRA ) 8 MG TABS Take 1 tablet (8 mg  total) by mouth daily.   Phentermine  HCl (LOMAIRA ) 8 MG TABS Take 1 tablet (8 mg total) by  mouth daily.   [DISCONTINUED] azithromycin  (ZITHROMAX ) 500 MG tablet Take 1 tablet (500 mg total) by mouth daily.   No facility-administered encounter medications on file as of 10/27/2023.     Follow-Up   Return in about 4 weeks (around 11/24/2023) for For Weight Mangement with Dr. Allie Area.Aaron Aas She was informed of the importance of frequent follow up visits to maximize her success with intensive lifestyle modifications for her multiple health conditions.  Attestation Statement   Reviewed by clinician on day of visit: allergies, medications, problem list, medical history, surgical history, family history, social history, and previous encounter notes.     Ladd Picker, MD

## 2023-10-27 NOTE — Assessment & Plan Note (Signed)
  Most recent A1c was 5.8 and stable.  She is currently on metformin  XR 500 mg twice a day and will continue medication for pharmacoprophylaxis.  Check hemoglobin A1c at 6 months with B12

## 2023-11-24 ENCOUNTER — Ambulatory Visit (INDEPENDENT_AMBULATORY_CARE_PROVIDER_SITE_OTHER): Admitting: Internal Medicine

## 2023-11-24 ENCOUNTER — Encounter (INDEPENDENT_AMBULATORY_CARE_PROVIDER_SITE_OTHER): Payer: Self-pay | Admitting: Internal Medicine

## 2023-11-24 VITALS — BP 113/75 | HR 63 | Temp 97.7°F | Ht 60.0 in | Wt 153.0 lb

## 2023-11-24 DIAGNOSIS — Z6831 Body mass index (BMI) 31.0-31.9, adult: Secondary | ICD-10-CM | POA: Diagnosis not present

## 2023-11-24 DIAGNOSIS — E66811 Obesity, class 1: Secondary | ICD-10-CM

## 2023-11-24 DIAGNOSIS — R7303 Prediabetes: Secondary | ICD-10-CM | POA: Diagnosis not present

## 2023-11-24 MED ORDER — LOMAIRA 8 MG PO TABS
8.0000 mg | ORAL_TABLET | Freq: Every day | ORAL | 0 refills | Status: DC
Start: 2023-11-24 — End: 2023-12-22

## 2023-11-24 NOTE — Progress Notes (Signed)
 Office: [phone]  /  Fax: [phone]  Weight Summary And Biometrics  Vitals Temp: 97.7 F (36.5 C) BP: 113/75 Pulse Rate: 63 SpO2: 99 %   Anthropometric Measurements Height: 5' (1.524 m) Weight: 153 lb (69.4 kg) BMI (Calculated): 29.88 Weight at Last Visit: 151 lb Weight Lost Since Last Visit: 0 lb Weight Gained Since Last Visit: 2 lb Starting Weight: 159 lb Total Weight Loss (lbs): 6 lb (2.722 kg) Peak Weight: 175 lb   Body Composition  Body Fat %: 41.2 % Fat Mass (lbs): 63.2 lbs Muscle Mass (lbs): 85.6 lbs Total Body Water  (lbs): 58.2 lbs Visceral Fat Rating : 12    RMR: 1253  Today's Visit #: 16  Starting Date: 09/16/22   Subjective   Chief Complaint: Obesity  Interval History Discussed the use of AI scribe software for clinical note transcription with the patient, who gave verbal consent to proceed.  History of Present Illness   Carol [name] "Carol [name]" is a 73 year old female who presents for medical weight management.  She has gained two pounds since her last office visit. She follows a 1000 calorie nutrition plan approximately 85% of the time, focusing on whole foods and adequate protein intake. She exercises twice a week for about 60 minutes, engaging in strengthening cardio and walking. She reports inadequate sleep and higher levels of stress.  For anti-obesity treatment, she is taking Lomira, 8 mg, one tablet daily, which was started in March. She feels that the medication is providing appetite suppression despite the recent weight gain, which she attributes to a vacation.  She has a history of prediabetes and is currently taking metformin , one tablet twice a day. Her last A1c was 5.8 in January; a previous value was 6.1. No side effects from metformin .  She has been attending Pilates classes and doing strength training, which has resulted in significant muscle soreness. She describes difficulty raising her arms due to soreness after  a session.  No significant issues with insomnia, although she has never slept well. She is also taking senesemide, one tablet twice a day, as prescribed by her neurologist.       Challenges affecting patient progress: slow metabolism for age and menopause.    Pharmacotherapy for weight management: She is currently taking Phentermine  (longterm use, single agent)  with adequate clinical response  and without side effects..   Assessment and Plan   Treatment Plan For Obesity:  Recommended Dietary Goals  [name] is currently in the action stage of change. As such, her goal is to continue weight management plan. She has agreed to: continue current plan  Behavioral Health and Counseling  We discussed the following behavioral modification strategies today: continue to work on maintaining a reduced calorie state, getting the recommended amount of protein, incorporating whole foods, making healthy choices, staying well hydrated and practicing mindfulness when eating..  Additional education and resources provided today: Personalized instruction on the use of artificial intelligence for recipes, tailored meal plans, calorie tracking, and finding healthier options when eating out.   Recommended Physical Activity Goals  [name] has been advised to work up to 150 minutes of moderate intensity aerobic activity a week and strengthening exercises 2-3 times per week for cardiovascular health, weight loss maintenance and preservation of muscle mass.   She has agreed to :  continue to gradually increase the amount and intensity of exercise routine  Pharmacotherapy  We discussed various medication options to help Marua with her weight loss efforts and we both  agreed to : Adequate clinical response to anti-obesity medication, continue current regimen  Associated Conditions Impacted by Obesity Treatment  Prediabetes  Class 1 obesity with serious comorbidity and body mass index (BMI) of 31.0 to 31.9 in  adult, unspecified obesity type -     Lomaira ; Take 1 tablet (8 mg total) by mouth daily.  Dispense: 28 tablet; Refill: 0     Assessment and Plan    Obesity She is undergoing medical weight management and has gained two pounds since the last visit. She adheres to a 1000-calorie nutrition plan 85% of the time and exercises twice a week. Reports inadequate sleep and higher stress levels. On Lomira 8 mg daily for anti-obesity treatment, started in March. Feels the medication is effective in appetite suppression despite recent weight gain, attributed to vacation. Emphasized importance of maintaining a calorie deficit and suggests incorporating meal replacements to aid in weight loss. Discussed using AI tools like ChatGPT for personalized meal planning. Encouraged to focus on maintaining a 1000-calorie daily intake and to continue physical activity, including Pilates and strength training. - Continue Lomira 8 mg daily.  Heart rate and blood pressure well-controlled - Incorporate meal replacement shakes with 150-160 calories per serving. - Use AI tools like ChatGPT to create a personalized meal plan. - Focus on maintaining a 1000-calorie daily intake. - Encourage physical activity, including Pilates and strength training.  Prediabetes She has prediabetes and is currently on metformin , one tablet twice a day. Her A1c was 5.8 in January, improved from 6.1. Emphasized importance of continuing metformin  and maintaining a calorie deficit to manage prediabetes effectively. Discussed the small margin with calorie intake due to her metabolic rate of 4098, aligning with her breathing test results, and the need to stay closer to 1000 calories for weight loss. - Continue metformin , one tablet twice a day. - Monitor A1c levels regularly. - Maintain a 1000-calorie daily intake to manage weight and blood sugar levels.  General Health Maintenance Advised on lifestyle modifications to support weight management and  overall health. Discussed use of AI tools for meal planning and importance of choosing healthier food options while traveling. Recommended limiting intake of sugary cocktails and opting for lower-calorie alcoholic beverages. - Use AI tools for meal planning and dietary guidance. - Choose healthier food options while traveling. - Limit intake of sugary cocktails and opt for lower-calorie alcoholic beverages.  Recording duration: 25 minutes        Objective   Physical Exam:  Blood pressure 113/75, pulse 63, temperature 97.7 F (36.5 C), height 5' (1.524 m), weight 153 lb (69.4 kg), last menstrual period 06/23/1990, SpO2 99%. Body mass index is 29.88 kg/m.  General: She is overweight, cooperative, alert, well developed, and in no acute distress. PSYCH: Has normal mood, affect and thought process.   HEENT: EOMI, sclerae are anicteric. Lungs: Normal breathing effort, no conversational dyspnea. Extremities: No edema.  Neurologic: No gross sensory or motor deficits. No tremors or fasciculations noted.    Diagnostic Data Reviewed:  BMET    Component Value Date/Time   NA 140 09/21/2023 1552   NA 141 07/23/2023 1133   K 4.3 09/21/2023 1552   CL 105 09/21/2023 1552   CO2 26 09/21/2023 1552   GLUCOSE 111 (H) 09/21/2023 1552   BUN 21 09/21/2023 1552   BUN 16 07/23/2023 1133   CREATININE 0.89 09/21/2023 1552   CREATININE 0.76 03/16/2017 1354   CALCIUM  9.7 09/21/2023 1552   GFRNONAA >60 09/21/2023 1552   GFRAA 79 10/21/2019  1326   Lab Results  Component Value Date   HGBA1C 5.8 (A) 07/23/2023   HGBA1C 5.6 02/22/2013   Lab Results  Component Value Date   INSULIN  11.1 09/16/2022   Lab Results  Component Value Date   TSH 2.380 08/18/2022   CBC    Component Value Date/Time   WBC 8.6 09/21/2023 1552   RBC 5.16 (H) 09/21/2023 1552   HGB 14.4 09/21/2023 1552   HGB 14.3 07/23/2023 1133   HGB 14.3 05/21/2016 1128   HCT 45.3 09/21/2023 1552   HCT 44.1 07/23/2023 1133   PLT  302 09/21/2023 1552   PLT 313 07/23/2023 1133   MCV 87.8 09/21/2023 1552   MCV 86 07/23/2023 1133   MCH 27.9 09/21/2023 1552   MCHC 31.8 09/21/2023 1552   RDW 13.2 09/21/2023 1552   RDW 13.6 07/23/2023 1133   Iron Studies    Component Value Date/Time   IRON 47 10/22/2022 1430   TIBC 323 10/22/2022 1430   FERRITIN 166 (H) 10/22/2022 1430   IRONPCTSAT 15 10/22/2022 1430   Lipid Panel     Component Value Date/Time   CHOL 190 07/23/2023 1133   TRIG 163 (H) 07/23/2023 1133   HDL 56 07/23/2023 1133   CHOLHDL 3.4 07/23/2023 1133   CHOLHDL 3.2 05/21/2016 1407   VLDL 44 (H) 05/21/2016 1407   LDLCALC 106 (H) 07/23/2023 1133   Hepatic Function Panel     Component Value Date/Time   PROT 6.8 07/23/2023 1133   ALBUMIN  4.5 07/23/2023 1133   AST 23 07/23/2023 1133   ALT 19 07/23/2023 1133   ALKPHOS 85 07/23/2023 1133   BILITOT 0.2 07/23/2023 1133      Component Value Date/Time   TSH 2.380 08/18/2022 1310   Nutritional Lab Results  Component Value Date   VD25OH 51.2 04/23/2022   VD25OH 39.4 05/27/2017   VD25OH 31 05/21/2016    Medications: Outpatient Encounter Medications as of 11/24/2023  Medication Sig   albuterol  (PROAIR  HFA) 108 (90 Base) MCG/ACT inhaler INHALE 2 PUFFS INTO THE LUNGS EVERY 6 HOURS AS NEEDED FOR WHEEZING/ SHORTNESS OF BREATH.   Apoaequorin (PREVAGEN) 10 MG CAPS Take by mouth in the morning.   atorvastatin  (LIPITOR) 40 MG tablet Take 1 tablet (40 mg total) by mouth daily.   diclofenac sodium (VOLTAREN) 1 % GEL Apply 2 g topically 4 (four) times daily.   DOXYCYCLINE  HYCLATE PO Take 100 mg by mouth as needed.   ibandronate  (BONIVA ) 150 MG tablet Take in the morning with a full glass of water  once a month, on an empty stomach, do not take anything else by mouth or lie down for the next 30 min.   metFORMIN  (GLUCOPHAGE -XR) 500 MG 24 hr tablet Take 1 tablet (500 mg total) by mouth 2 (two) times daily with a meal.   methocarbamol (ROBAXIN) 500 MG tablet Take 500  mg by mouth daily as needed (migraine).   Multiple Vitamins-Minerals (CENTRUM SILVER 50+WOMEN PO) Take by mouth in the morning.   NON FORMULARY Viviscal   pantoprazole  (PROTONIX ) 40 MG tablet Take 1 tablet (40 mg total) by mouth daily.   valACYclovir  (VALTREX ) 500 MG tablet Take 1 tablet (500 mg total) by mouth daily.   zonisamide  (ZONEGRAN ) 100 MG capsule Take 100 mg by mouth 2 (two) times daily.   [DISCONTINUED] Phentermine  HCl (LOMAIRA ) 8 MG TABS Take 1 tablet (8 mg total) by mouth daily.   Phentermine  HCl (LOMAIRA ) 8 MG TABS Take 1 tablet (8 mg total) by  mouth daily.   No facility-administered encounter medications on file as of 11/24/2023.     Follow-Up   Return in about 4 weeks (around 12/22/2023) for For Weight Mangement with Dr. Allie Area.Aaron Aas She was informed of the importance of frequent follow up visits to maximize her success with intensive lifestyle modifications for her multiple health conditions.  Attestation Statement   Reviewed by clinician on day of visit: allergies, medications, problem list, medical history, surgical history, family history, social history, and previous encounter notes.     Ladd Picker, MD

## 2023-11-30 DIAGNOSIS — G43719 Chronic migraine without aura, intractable, without status migrainosus: Secondary | ICD-10-CM | POA: Diagnosis not present

## 2023-12-03 ENCOUNTER — Telehealth: Payer: Self-pay | Admitting: Family Medicine

## 2023-12-03 NOTE — Telephone Encounter (Signed)
Yes will accept.

## 2023-12-03 NOTE — Telephone Encounter (Signed)
 Copied from CRM [id number]. Topic: Clinical - Medical Advice >> Dec 03, 2023 11:39 AM Keitha Pata L wrote: Reason for CRM: patient is calling in and asking for her and spouse to be considered as future patients for Dr. Donnette Gal. She said her current Dr. Is retiring and [place] Cb# [phone]

## 2023-12-15 NOTE — Telephone Encounter (Signed)
 Pt called back. Both she and her partner are sch

## 2023-12-17 ENCOUNTER — Encounter: Payer: Self-pay | Admitting: Internal Medicine

## 2023-12-17 NOTE — Progress Notes (Signed)
 Subjective:    Patient ID: Carol [name], female    DOB: 1951/04/13, 73 y.o.   MRN: [id number]     HPI Carol [name] is here for follow up of her chronic medical problems.  She is here to establish with a new pcp.    She does exercise regularly-Pilates, walking, chair   Has no specific concerns  Medications and allergies reviewed with patient and updated if appropriate.  Current Outpatient Medications on File Prior to Visit  Medication Sig Dispense Refill   albuterol  (PROAIR  HFA) 108 (90 Base) MCG/ACT inhaler INHALE 2 PUFFS INTO THE LUNGS EVERY 6 HOURS AS NEEDED FOR WHEEZING/ SHORTNESS OF BREATH. 8.5 g 0   Apoaequorin (PREVAGEN) 10 MG CAPS Take by mouth in the morning.     atorvastatin  (LIPITOR) 40 MG tablet Take 1 tablet (40 mg total) by mouth daily. 90 tablet 3   diclofenac sodium (VOLTAREN) 1 % GEL Apply 2 g topically 4 (four) times daily.     DOXYCYCLINE  HYCLATE PO Take 100 mg by mouth as needed.     ibandronate  (BONIVA ) 150 MG tablet Take in the morning with a full glass of water  once a month, on an empty stomach, do not take anything else by mouth or lie down for the next 30 min. 3 tablet 3   metFORMIN  (GLUCOPHAGE -XR) 500 MG 24 hr tablet Take 1 tablet (500 mg total) by mouth 2 (two) times daily with a meal. 180 tablet 0   methocarbamol (ROBAXIN) 500 MG tablet Take 500 mg by mouth daily as needed (migraine).     Multiple Vitamins-Minerals (CENTRUM SILVER 50+WOMEN PO) Take by mouth in the morning.     NON FORMULARY Viviscal     pantoprazole  (PROTONIX ) 40 MG tablet Take 1 tablet (40 mg total) by mouth daily. 90 tablet 3   Phentermine  HCl (LOMAIRA ) 8 MG TABS Take 1 tablet (8 mg total) by mouth daily. 28 tablet 0   valACYclovir  (VALTREX ) 500 MG tablet Take 1 tablet (500 mg total) by mouth daily. 90 tablet 3   zonisamide  (ZONEGRAN ) 100 MG capsule Take 100 mg by mouth 2 (two) times daily.     No current facility-administered medications on file prior to visit.     Review  of Systems  Constitutional:  Negative for fever.  Respiratory:  Negative for cough, shortness of breath and wheezing.   Cardiovascular:  Positive for leg swelling (right ankle - a little tender). Negative for chest pain and palpitations.  Neurological:  Positive for light-headedness. Negative for dizziness and headaches.  Psychiatric/Behavioral:  Negative for dysphoric mood. The patient is not nervous/anxious.        Objective:   Vitals:   12/18/23 1117  BP: 110/72  Pulse: 78  Temp: 97.9 F (36.6 C)  SpO2: 98%   BP Readings from Last 3 Encounters:  12/18/23 110/72  11/24/23 113/75  10/27/23 114/75   Wt Readings from Last 3 Encounters:  12/18/23 157 lb (71.2 kg)  11/24/23 153 lb (69.4 kg)  10/27/23 151 lb (68.5 kg)   Body mass index is 30.66 kg/m.    Physical Exam Constitutional:      General: She is not in acute distress.    Appearance: Normal appearance.  HENT:     Head: Normocephalic and atraumatic.   Eyes:     Conjunctiva/sclera: Conjunctivae normal.    Cardiovascular:     Rate and Rhythm: Normal rate and regular rhythm.     Heart sounds: Normal heart  sounds.  Pulmonary:     Effort: Pulmonary effort is normal. No respiratory distress.     Breath sounds: Normal breath sounds. No wheezing.  Abdominal:     General: There is no distension.     Palpations: Abdomen is soft.     Tenderness: There is no abdominal tenderness.   Musculoskeletal:     Cervical back: Neck supple.     Right lower leg: No edema.     Left lower leg: No edema.  Lymphadenopathy:     Cervical: No cervical adenopathy.   Skin:    General: Skin is warm and dry.     Findings: No rash.   Neurological:     Mental Status: She is alert. Mental status is at baseline.   Psychiatric:        Mood and Affect: Mood normal.        Behavior: Behavior normal.        Lab Results  Component Value Date   WBC 8.6 09/21/2023   HGB 14.4 09/21/2023   HCT 45.3 09/21/2023   PLT 302 09/21/2023    GLUCOSE 111 (H) 09/21/2023   CHOL 190 07/23/2023   TRIG 163 (H) 07/23/2023   HDL 56 07/23/2023   LDLCALC 106 (H) 07/23/2023   ALT 19 07/23/2023   AST 23 07/23/2023   NA 140 09/21/2023   K 4.3 09/21/2023   CL 105 09/21/2023   CREATININE 0.89 09/21/2023   BUN 21 09/21/2023   CO2 26 09/21/2023   TSH 2.380 08/18/2022   INR 1.4 (H) 05/02/2021   HGBA1C 5.8 (A) 07/23/2023     Assessment & Plan:    See Problem List for Assessment and Plan of chronic medical problems.

## 2023-12-18 ENCOUNTER — Ambulatory Visit (INDEPENDENT_AMBULATORY_CARE_PROVIDER_SITE_OTHER): Admitting: Internal Medicine

## 2023-12-18 VITALS — BP 110/72 | HR 78 | Temp 97.9°F | Ht 60.0 in | Wt 157.0 lb

## 2023-12-18 DIAGNOSIS — G43E09 Chronic migraine with aura, not intractable, without status migrainosus: Secondary | ICD-10-CM

## 2023-12-18 DIAGNOSIS — K219 Gastro-esophageal reflux disease without esophagitis: Secondary | ICD-10-CM | POA: Diagnosis not present

## 2023-12-18 DIAGNOSIS — R7303 Prediabetes: Secondary | ICD-10-CM | POA: Diagnosis not present

## 2023-12-18 DIAGNOSIS — M810 Age-related osteoporosis without current pathological fracture: Secondary | ICD-10-CM

## 2023-12-18 DIAGNOSIS — E785 Hyperlipidemia, unspecified: Secondary | ICD-10-CM

## 2023-12-18 DIAGNOSIS — J4520 Mild intermittent asthma, uncomplicated: Secondary | ICD-10-CM

## 2023-12-18 DIAGNOSIS — E66811 Obesity, class 1: Secondary | ICD-10-CM | POA: Insufficient documentation

## 2023-12-18 DIAGNOSIS — Z87442 Personal history of urinary calculi: Secondary | ICD-10-CM | POA: Diagnosis not present

## 2023-12-18 DIAGNOSIS — Z86718 Personal history of other venous thrombosis and embolism: Secondary | ICD-10-CM

## 2023-12-18 NOTE — Assessment & Plan Note (Signed)
 Chronic Following with Dr. Oneita at the headache clinic Currently on zonisamide  for prevention Takes methocarbamol as needed for headaches

## 2023-12-18 NOTE — Assessment & Plan Note (Signed)
 Chronic On Boniva  150 mg once a month-started sometime in 2024 She is exercising regularly

## 2023-12-18 NOTE — Assessment & Plan Note (Signed)
 Chronic Exercise-induced Uses albuterol  inhaler as needed

## 2023-12-18 NOTE — Assessment & Plan Note (Signed)
 Chronic Lab Results  Component Value Date   HGBA1C 5.8 (A) 07/23/2023    On metformin  XR 500 mg twice daily Eating a healthy diet Working with [facility]

## 2023-12-18 NOTE — Assessment & Plan Note (Signed)
 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

## 2023-12-18 NOTE — Patient Instructions (Addendum)
       Medications changes include :   None      Return for Physical Exam when due.

## 2023-12-18 NOTE — Assessment & Plan Note (Signed)
 Chronic Regular exercise and healthy diet encouraged Working on weight loss Continue atorvastatin  40 mg daily

## 2023-12-18 NOTE — Assessment & Plan Note (Signed)
 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 Alvaro No recurrence of stones for several years Good with hydration

## 2023-12-18 NOTE — Assessment & Plan Note (Signed)
 Chronic GERD controlled Continue pantoprazole 40 mg daily

## 2023-12-18 NOTE — Assessment & Plan Note (Signed)
 Chronic Following with [facility] Currently on metformin , phentermine  She is exercising Continues to work on diet

## 2023-12-22 ENCOUNTER — Ambulatory Visit (INDEPENDENT_AMBULATORY_CARE_PROVIDER_SITE_OTHER): Admitting: Internal Medicine

## 2023-12-22 ENCOUNTER — Encounter (INDEPENDENT_AMBULATORY_CARE_PROVIDER_SITE_OTHER): Payer: Self-pay | Admitting: Internal Medicine

## 2023-12-22 VITALS — BP 116/76 | HR 79 | Temp 97.7°F | Ht 60.0 in | Wt 154.0 lb

## 2023-12-22 DIAGNOSIS — E66811 Obesity, class 1: Secondary | ICD-10-CM | POA: Diagnosis not present

## 2023-12-22 DIAGNOSIS — Z6831 Body mass index (BMI) 31.0-31.9, adult: Secondary | ICD-10-CM | POA: Diagnosis not present

## 2023-12-22 DIAGNOSIS — R7303 Prediabetes: Secondary | ICD-10-CM | POA: Diagnosis not present

## 2023-12-22 MED ORDER — LOMAIRA 8 MG PO TABS
8.0000 mg | ORAL_TABLET | Freq: Every day | ORAL | 0 refills | Status: DC
Start: 2023-12-22 — End: 2024-01-19

## 2023-12-22 NOTE — Assessment & Plan Note (Signed)
  Most recent A1c was 5.8 and stable.  She is currently on metformin  XR 500 mg twice a day and will continue medication for pharmacoprophylaxis.  Check hemoglobin A1c at 6 months with B12

## 2023-12-22 NOTE — Progress Notes (Signed)
 Office: [phone]  /  Fax: [phone]  Weight Summary and Body Composition Analysis (BIA)  Vitals Temp: 97.7 F (36.5 C) BP: 116/76 Pulse Rate: 79 SpO2: 98 %   Anthropometric Measurements Height: 5' (1.524 m) Weight: 154 lb (69.9 kg) BMI (Calculated): 30.08 Weight at Last Visit: 153 lb Weight Lost Since Last Visit: 0 lb Weight Gained Since Last Visit: 1 lb Starting Weight: 159 lb Total Weight Loss (lbs): 5 lb (2.268 kg) Peak Weight: 175 lb   Body Composition  Body Fat %: 41.2 % Fat Mass (lbs): 63.8 lbs Muscle Mass (lbs): 86.4 lbs Total Body Water  (lbs): 59 lbs Visceral Fat Rating : 12    RMR: 1253  Today's Visit #: 17  Starting Date: 09/16/22   Subjective   Chief Complaint: Obesity  Interval History Discussed the use of AI scribe software for clinical note transcription with the patient, who gave verbal consent to proceed.  History of Present Illness   Carol [name] is a 73 year old female who presents for medical weight management.  She has been on phentermine  8 mg daily since March 2024 for weight management. Since her last office visit, she has gained one pound. Her weight trajectory has shown fluctuations, described as a 'W' or 'waves' pattern, with a noted increase during holidays and travel periods. She recalls a fall in October, which may have impacted her weight management efforts.  Lomaira  has helped with her appetite, noting a decreased interest in red meat and an increased preference for salads. She feels fuller more quickly, often leaving food on her plate, which is a significant change from her previous habit of always finishing her meals.  Her current dietary regimen includes using Fairlife protein shakes for lunch, which provide 26 grams of protein and 200 calories. She typically consumes one Sprite Zero daily and drinks water  throughout the day. She acknowledges that her protein intake could be improved, particularly at  lunch if she does not have a shake. She finds it challenging to maintain a calorie intake of 1000 to 900 calories without incorporating meal replacements.  No palpitations, and her blood pressure and heart rate are stable.       Challenges affecting patient progress: slow metabolism for age.    Pharmacotherapy for weight management: She is currently taking Phentermine  (longterm use, single agent)  with adequate clinical response  and without side effects..   Assessment and Plan   Treatment Plan For Obesity:  Recommended Dietary Goals  [name] is currently in the action stage of change. As such, her goal is to continue weight management plan. She has agreed to: incorporate 1-2 meal replacements a day for convenience  and continue current plan  Behavioral Health and Counseling  We discussed the following behavioral modification strategies today: continue to work on maintaining a reduced calorie state, getting the recommended amount of protein, incorporating whole foods, making healthy choices, staying well hydrated and practicing mindfulness when eating..  Additional education and resources provided today: None  Recommended Physical Activity Goals  [name] has been advised to work up to 150 minutes of moderate intensity aerobic activity a week and strengthening exercises 2-3 times per week for cardiovascular health, weight loss maintenance and preservation of muscle mass.   She has agreed to :  Think about enjoyable ways to increase daily physical activity and overcoming barriers to exercise and Increase physical activity in their day and reduce sedentary time (increase NEAT).  Medical Interventions and Pharmacotherapy  We discussed various  medication options to help [name] with her weight loss efforts and we both agreed to : Adequate clinical response to anti-obesity medication, continue current regimen and we discussed if weight loss does not exceed 5% then Lomaira  will be  discontinued  Associated Conditions Impacted by Obesity Treatment  Assessment & Plan Class 1 obesity with serious comorbidity and body mass index (BMI) of 31.0 to 31.9 in adult, unspecified obesity type She has been on phentermine  (Lomaira ) 8 mg daily since March 2024 for weight management. Her weight has fluctuated with seasonal variation, gaining one pound since the last visit. Lomaira  has reduced her appetite, decreasing interest in red meat and increasing preference for salads. She feels fuller sooner during meals. Achieving a calorie deficit is challenging due to her basal metabolic rate of 8736 calories, requiring a 500-calorie deficit for weight loss. Emphasis is placed on protein intake to boost metabolic rate and provide satiety. Meal replacements are recommended to achieve a calorie deficit without excessive hunger. The current goal is gradual weight loss of half to three-quarters of a pound per week, aiming for a 5% weight reduction in six months with Lomaira . - Continue phentermine  (Lomaira ) 8 mg daily.  Blood pressure and heart rate are normal. - Track caloric intake and protein consumption. - Use meal replacements (e.g., Fairlife shakes) for one meal daily and consider using two meal replacements on two days per week. - Prioritize protein intake to enhance metabolic rate and satiety. - Consider intermittent fasting strategies if appropriate. - Reassess weight management plan in three weeks. - [facility]. Prediabetes  Most recent A1c was 5.8 and stable.  She is currently on metformin  XR 500 mg twice a day and will continue medication for pharmacoprophylaxis.  Check hemoglobin A1c at 6 months with B12          Objective   Physical Exam:  Blood pressure 116/76, pulse 79, temperature 97.7 F (36.5 C), height 5' (1.524 m), weight 154 lb (69.9 kg), last menstrual period 06/23/1990, SpO2 98%. Body mass index is 30.08 kg/m.  General: She is overweight,  cooperative, alert, well developed, and in no acute distress. PSYCH: Has normal mood, affect and thought process.   HEENT: EOMI, sclerae are anicteric. Lungs: Normal breathing effort, no conversational dyspnea. Extremities: No edema.  Neurologic: No gross sensory or motor deficits. No tremors or fasciculations noted.    Diagnostic Data Reviewed:  BMET    Component Value Date/Time   NA 140 09/21/2023 1552   NA 141 07/23/2023 1133   K 4.3 09/21/2023 1552   CL 105 09/21/2023 1552   CO2 26 09/21/2023 1552   GLUCOSE 111 (H) 09/21/2023 1552   BUN 21 09/21/2023 1552   BUN 16 07/23/2023 1133   CREATININE 0.89 09/21/2023 1552   CREATININE 0.76 03/16/2017 1354   CALCIUM  9.7 09/21/2023 1552   GFRNONAA >60 09/21/2023 1552   GFRAA 79 10/21/2019 1326   Lab Results  Component Value Date   HGBA1C 5.8 (A) 07/23/2023   HGBA1C 5.6 02/22/2013   Lab Results  Component Value Date   INSULIN  11.1 09/16/2022   Lab Results  Component Value Date   TSH 2.380 08/18/2022   CBC    Component Value Date/Time   WBC 8.6 09/21/2023 1552   RBC 5.16 (H) 09/21/2023 1552   HGB 14.4 09/21/2023 1552   HGB 14.3 07/23/2023 1133   HGB 14.3 05/21/2016 1128   HCT 45.3 09/21/2023 1552   HCT 44.1 07/23/2023 1133   PLT 302  09/21/2023 1552   PLT 313 07/23/2023 1133   MCV 87.8 09/21/2023 1552   MCV 86 07/23/2023 1133   MCH 27.9 09/21/2023 1552   MCHC 31.8 09/21/2023 1552   RDW 13.2 09/21/2023 1552   RDW 13.6 07/23/2023 1133   Iron Studies    Component Value Date/Time   IRON 47 10/22/2022 1430   TIBC 323 10/22/2022 1430   FERRITIN 166 (H) 10/22/2022 1430   IRONPCTSAT 15 10/22/2022 1430   Lipid Panel     Component Value Date/Time   CHOL 190 07/23/2023 1133   TRIG 163 (H) 07/23/2023 1133   HDL 56 07/23/2023 1133   CHOLHDL 3.4 07/23/2023 1133   CHOLHDL 3.2 05/21/2016 1407   VLDL 44 (H) 05/21/2016 1407   LDLCALC 106 (H) 07/23/2023 1133   Hepatic Function Panel     Component Value Date/Time    PROT 6.8 07/23/2023 1133   ALBUMIN  4.5 07/23/2023 1133   AST 23 07/23/2023 1133   ALT 19 07/23/2023 1133   ALKPHOS 85 07/23/2023 1133   BILITOT 0.2 07/23/2023 1133      Component Value Date/Time   TSH 2.380 08/18/2022 1310   Nutritional Lab Results  Component Value Date   VD25OH 51.2 04/23/2022   VD25OH 39.4 05/27/2017   VD25OH 31 05/21/2016    Medications: Outpatient Encounter Medications as of 12/22/2023  Medication Sig   albuterol  (PROAIR  HFA) 108 (90 Base) MCG/ACT inhaler INHALE 2 PUFFS INTO THE LUNGS EVERY 6 HOURS AS NEEDED FOR WHEEZING/ SHORTNESS OF BREATH.   Apoaequorin (PREVAGEN) 10 MG CAPS Take by mouth in the morning.   atorvastatin  (LIPITOR) 40 MG tablet Take 1 tablet (40 mg total) by mouth daily.   diclofenac sodium (VOLTAREN) 1 % GEL Apply 2 g topically 4 (four) times daily.   DOXYCYCLINE  HYCLATE PO Take 100 mg by mouth as needed.   ibandronate  (BONIVA ) 150 MG tablet Take in the morning with a full glass of water  once a month, on an empty stomach, do not take anything else by mouth or lie down for the next 30 min.   metFORMIN  (GLUCOPHAGE -XR) 500 MG 24 hr tablet Take 1 tablet (500 mg total) by mouth 2 (two) times daily with a meal.   methocarbamol (ROBAXIN) 500 MG tablet Take 500 mg by mouth daily as needed (migraine).   Multiple Vitamins-Minerals (CENTRUM SILVER 50+WOMEN PO) Take by mouth in the morning.   NON FORMULARY Viviscal   pantoprazole  (PROTONIX ) 40 MG tablet Take 1 tablet (40 mg total) by mouth daily.   valACYclovir  (VALTREX ) 500 MG tablet Take 1 tablet (500 mg total) by mouth daily.   zonisamide  (ZONEGRAN ) 100 MG capsule Take 100 mg by mouth 2 (two) times daily.   [DISCONTINUED] Phentermine  HCl (LOMAIRA ) 8 MG TABS Take 1 tablet (8 mg total) by mouth daily.   Phentermine  HCl (LOMAIRA ) 8 MG TABS Take 1 tablet (8 mg total) by mouth daily.   No facility-administered encounter medications on file as of 12/22/2023.     Follow-Up   No follow-ups on file.SABRA She  was informed of the importance of frequent follow up visits to maximize her success with intensive lifestyle modifications for her multiple health conditions.  Attestation Statement   Reviewed by clinician on day of visit: allergies, medications, problem list, medical history, surgical history, family history, social history, and previous encounter notes.     Lucas Parker, MD

## 2023-12-22 NOTE — Assessment & Plan Note (Signed)
 She has been on phentermine  (Lomaira ) 8 mg daily since March 2024 for weight management. Her weight has fluctuated with seasonal variation, gaining one pound since the last visit. Lomaira  has reduced her appetite, decreasing interest in red meat and increasing preference for salads. She feels fuller sooner during meals. Achieving a calorie deficit is challenging due to her basal metabolic rate of 8736 calories, requiring a 500-calorie deficit for weight loss. Emphasis is placed on protein intake to boost metabolic rate and provide satiety. Meal replacements are recommended to achieve a calorie deficit without excessive hunger. The current goal is gradual weight loss of half to three-quarters of a pound per week, aiming for a 5% weight reduction in six months with Lomaira . - Continue phentermine  (Lomaira ) 8 mg daily.  Blood pressure and heart rate are normal. - Track caloric intake and protein consumption. - Use meal replacements (e.g., Fairlife shakes) for one meal daily and consider using two meal replacements on two days per week. - Prioritize protein intake to enhance metabolic rate and satiety. - Consider intermittent fasting strategies if appropriate. - Reassess weight management plan in three weeks. - [facility].

## 2024-01-19 ENCOUNTER — Ambulatory Visit (INDEPENDENT_AMBULATORY_CARE_PROVIDER_SITE_OTHER): Admitting: Internal Medicine

## 2024-01-19 ENCOUNTER — Encounter (INDEPENDENT_AMBULATORY_CARE_PROVIDER_SITE_OTHER): Payer: Self-pay | Admitting: Internal Medicine

## 2024-01-19 DIAGNOSIS — E66811 Obesity, class 1: Secondary | ICD-10-CM

## 2024-01-19 DIAGNOSIS — Z6831 Body mass index (BMI) 31.0-31.9, adult: Secondary | ICD-10-CM

## 2024-01-19 DIAGNOSIS — R7303 Prediabetes: Secondary | ICD-10-CM

## 2024-01-19 MED ORDER — METFORMIN HCL ER 500 MG PO TB24: 500.0000 mg | ORAL_TABLET | Freq: Two times a day (BID) | ORAL | 0 refills | Status: DC

## 2024-01-19 NOTE — Assessment & Plan Note (Signed)
 Prediabetes is being managed with metformin . A1c monitoring is due, and previous triglyceride levels were elevated. LDL cholesterol was borderline elevated. - Continue metformin  for prediabetes management. - [facility]  prescription at [facility] on [address]. - Order A1c test, can be done today or at next fasting lab visit. - Schedule fasting labs for cholesterol and triglycerides at next appointment.

## 2024-01-19 NOTE — Addendum Note (Signed)
 Addended by: FRANCYNE LUCAS MATSU on: 01/19/2024 01:52 PM   Modules accepted: Level of Service

## 2024-01-19 NOTE — Assessment & Plan Note (Signed)
 Obesity management with phentermine  has not resulted in significant weight loss over the past four months. Body fat percentage has decreased, but weight has remained stable. Phentermine  dose is low to avoid side effects, and increasing it may lead to adverse effects. She is not interested in injectable medications for weight loss due to aversion to needles. Discussed the potential benefits of injectable medications, including appetite control and cardiovascular benefits, but she prefers to avoid them. - Discontinue phentermine  due to lack of clinical response - Implement meal replacements for two meals a day, increasing frequency to three days a week. - Encourage use of protein shakes and bars with fruits and vegetables to maintain low-calorie intake. - Discuss intermittent fasting as an alternative strategy for weight management.

## 2024-01-19 NOTE — Progress Notes (Signed)
 Office: [phone]  /  Fax: [phone]  Weight Summary and Body Composition Analysis (BIA)  Vitals Temp: 97.8 F (36.6 C) BP: 123/84 Pulse Rate: 89 SpO2: 95 %   Anthropometric Measurements Height: 5' (1.524 m) Weight: 155 lb (70.3 kg) BMI (Calculated): 30.27 Weight at Last Visit: 154 lb Weight Lost Since Last Visit: 0 lb Weight Gained Since Last Visit: 1 lb Starting Weight: 159 lb Total Weight Loss (lbs): 4 lb (1.814 kg) Peak Weight: 175 lb   Body Composition  Body Fat %: 40.9 % Fat Mass (lbs): 63.4 lbs Muscle Mass (lbs): 87.2 lbs Total Body Water  (lbs): 58.6 lbs Visceral Fat Rating : 12    RMR: 1253  Today's Visit #: 18  Starting Date: 09/16/22   Subjective   Chief Complaint: Obesity  Interval History Discussed the use of AI scribe software for clinical note transcription with the patient, who gave verbal consent to proceed.  History of Present Illness   Carol [name] is a 73 year old female with prediabetes and obesity who presents for weight management follow-up.  She has been on phentermine  since March 2025, starting with a half tablet and then increasing to a full tablet. Initially, she experienced side effects with Adipex, the regular phentermine , in February 2025, which led to the switch to her current regimen.  She uses meal replacements, specifically protein shakes, twice a week for two meals a day. She noticed a weight loss of two pounds after initially gaining three pounds when she did not use the shakes. She feels satisfied with the shakes until late afternoon, as she usually consumes them for breakfast and lunch. Her diet includes potatoes, which she enjoys, and occasionally keto bagels. She consumes pasta infrequently, about once every two weeks, and opts for salads when eating out, often including chicken for protein. She has noticed a decreased preference for steak since starting the medication.  No stress eating or  significant cravings for sweets, managing any cravings with cherry tomatoes. She needs to improve her protein intake, particularly with dinner meals. Her fat mass has changed from approximately 66-68 pounds to 57-59 pounds since starting the medication.  She is currently taking metformin  for prediabetes and requires a refill. Her last A1c was six months ago, and she is due for another test. Her cholesterol levels, particularly triglycerides and LDL, were previously elevated.       Challenges affecting patient progress: slow metabolism for age.    Pharmacotherapy for weight management: She is currently taking Metformin  (off label use for incretin effect and / or insulin  resistance and / or diabetes prevention) with adequate clinical response  and without side effects. and Phentermine  (longterm use, single agent)  without clinical response, without side effects., and medication will be discontinued due to lack of clinical response.   Assessment and Plan   Treatment Plan For Obesity:  Recommended Dietary Goals  Carol [name] is currently in the action stage of change. As such, her goal is to continue weight management plan. She has agreed to: incorporate 1-2 meal replacements a day for convenience , continue current plan, and incorporate time restricted eating 16 hours fasting with an 8 hour eating window while maintaining reduced calorie nutrition plan  Behavioral Health and Counseling  We discussed the following behavioral modification strategies today: continue to work on maintaining a reduced calorie state, getting the recommended amount of protein, incorporating whole foods, making healthy choices, staying well hydrated and practicing mindfulness when eating..  Additional education and resources  provided today: None  Recommended Physical Activity Goals  Carol [name] has been advised to work up to 150 minutes of moderate intensity aerobic activity a week and strengthening exercises 2-3 times per week  for cardiovascular health, weight loss maintenance and preservation of muscle mass.   She has agreed to :  Think about enjoyable ways to increase daily physical activity and overcoming barriers to exercise and Increase physical activity in their day and reduce sedentary time (increase NEAT).  Medical Interventions and Pharmacotherapy  We discussed various medication options to help Carol [name] with her weight loss efforts and we both agreed to : Continue metformin  for diabetes prevention discontinue phentermine  due to lack of clinical response after 4 months of treatment  Associated Conditions Impacted by Obesity Treatment  Assessment & Plan Class 1 obesity with serious comorbidity and body mass index (BMI) of 31.0 to 31.9 in adult, unspecified obesity type Obesity management with phentermine  has not resulted in significant weight loss over the past four months. Body fat percentage has decreased, but weight has remained stable. Phentermine  dose is low to avoid side effects, and increasing it may lead to adverse effects. She is not interested in injectable medications for weight loss due to aversion to needles. Discussed the potential benefits of injectable medications, including appetite control and cardiovascular benefits, but she prefers to avoid them. - Discontinue phentermine  due to lack of clinical response - Implement meal replacements for two meals a day, increasing frequency to three days a week. - Encourage use of protein shakes and bars with fruits and vegetables to maintain low-calorie intake. - Discuss intermittent fasting as an alternative strategy for weight management. Prediabetes Prediabetes is being managed with metformin . A1c monitoring is due, and previous triglyceride levels were elevated. LDL cholesterol was borderline elevated. - Continue metformin  for prediabetes management. - Refill metformin  prescription at [facility] on [address]. - Order A1c test, can be done today or at next  fasting lab visit. - Schedule fasting labs for cholesterol and triglycerides at next appointment.            Objective   Physical Exam:  Blood pressure 123/84, pulse 89, temperature 97.8 F (36.6 C), height 5' (1.524 m), weight 155 lb (70.3 kg), last menstrual period 06/23/1990, SpO2 95%. Body mass index is 30.27 kg/m.  General: She is overweight, cooperative, alert, well developed, and in no acute distress. PSYCH: Has normal mood, affect and thought process.   HEENT: EOMI, sclerae are anicteric. Lungs: Normal breathing effort, no conversational dyspnea. Extremities: No edema.  Neurologic: No gross sensory or motor deficits. No tremors or fasciculations noted.    Diagnostic Data Reviewed:  BMET    Component Value Date/Time   NA 140 09/21/2023 1552   NA 141 07/23/2023 1133   K 4.3 09/21/2023 1552   CL 105 09/21/2023 1552   CO2 26 09/21/2023 1552   GLUCOSE 111 (H) 09/21/2023 1552   BUN 21 09/21/2023 1552   BUN 16 07/23/2023 1133   CREATININE 0.89 09/21/2023 1552   CREATININE 0.76 03/16/2017 1354   CALCIUM  9.7 09/21/2023 1552   GFRNONAA >60 09/21/2023 1552   GFRAA 79 10/21/2019 1326   Lab Results  Component Value Date   HGBA1C 5.8 (A) 07/23/2023   HGBA1C 5.6 02/22/2013   Lab Results  Component Value Date   INSULIN  11.1 09/16/2022   Lab Results  Component Value Date   TSH 2.380 08/18/2022   CBC    Component Value Date/Time   WBC 8.6 09/21/2023 1552  RBC 5.16 (H) 09/21/2023 1552   HGB 14.4 09/21/2023 1552   HGB 14.3 07/23/2023 1133   HGB 14.3 05/21/2016 1128   HCT 45.3 09/21/2023 1552   HCT 44.1 07/23/2023 1133   PLT 302 09/21/2023 1552   PLT 313 07/23/2023 1133   MCV 87.8 09/21/2023 1552   MCV 86 07/23/2023 1133   MCH 27.9 09/21/2023 1552   MCHC 31.8 09/21/2023 1552   RDW 13.2 09/21/2023 1552   RDW 13.6 07/23/2023 1133   Iron Studies    Component Value Date/Time   IRON 47 10/22/2022 1430   TIBC 323 10/22/2022 1430   FERRITIN 166 (H)  10/22/2022 1430   IRONPCTSAT 15 10/22/2022 1430   Lipid Panel     Component Value Date/Time   CHOL 190 07/23/2023 1133   TRIG 163 (H) 07/23/2023 1133   HDL 56 07/23/2023 1133   CHOLHDL 3.4 07/23/2023 1133   CHOLHDL 3.2 05/21/2016 1407   VLDL 44 (H) 05/21/2016 1407   LDLCALC 106 (H) 07/23/2023 1133   Hepatic Function Panel     Component Value Date/Time   PROT 6.8 07/23/2023 1133   ALBUMIN  4.5 07/23/2023 1133   AST 23 07/23/2023 1133   ALT 19 07/23/2023 1133   ALKPHOS 85 07/23/2023 1133   BILITOT 0.2 07/23/2023 1133      Component Value Date/Time   TSH 2.380 08/18/2022 1310   Nutritional Lab Results  Component Value Date   VD25OH 51.2 04/23/2022   VD25OH 39.4 05/27/2017   VD25OH 31 05/21/2016    Medications: Outpatient Encounter Medications as of 01/19/2024  Medication Sig   albuterol  (PROAIR  HFA) 108 (90 Base) MCG/ACT inhaler INHALE 2 PUFFS INTO THE LUNGS EVERY 6 HOURS AS NEEDED FOR WHEEZING/ SHORTNESS OF BREATH.   Apoaequorin (PREVAGEN) 10 MG CAPS Take by mouth in the morning.   atorvastatin  (LIPITOR) 40 MG tablet Take 1 tablet (40 mg total) by mouth daily.   diclofenac sodium (VOLTAREN) 1 % GEL Apply 2 g topically 4 (four) times daily.   DOXYCYCLINE  HYCLATE PO Take 100 mg by mouth as needed.   ibandronate  (BONIVA ) 150 MG tablet Take in the morning with a full glass of water  once a month, on an empty stomach, do not take anything else by mouth or lie down for the next 30 min.   metFORMIN  (GLUCOPHAGE -XR) 500 MG 24 hr tablet Take 1 tablet (500 mg total) by mouth 2 (two) times daily with a meal.   methocarbamol (ROBAXIN) 500 MG tablet Take 500 mg by mouth daily as needed (migraine).   Multiple Vitamins-Minerals (CENTRUM SILVER 50+WOMEN PO) Take by mouth in the morning.   NON FORMULARY Viviscal   pantoprazole  (PROTONIX ) 40 MG tablet Take 1 tablet (40 mg total) by mouth daily.   Phentermine  HCl (LOMAIRA ) 8 MG TABS Take 1 tablet (8 mg total) by mouth daily.   valACYclovir   (VALTREX ) 500 MG tablet Take 1 tablet (500 mg total) by mouth daily.   zonisamide  (ZONEGRAN ) 100 MG capsule Take 100 mg by mouth 2 (two) times daily.   No facility-administered encounter medications on file as of 01/19/2024.     Follow-Up   No follow-ups on file.SABRA She was informed of the importance of frequent follow up visits to maximize her success with intensive lifestyle modifications for her multiple health conditions.  Attestation Statement   Reviewed by clinician on day of visit: allergies, medications, problem list, medical history, surgical history, family history, social history, and previous encounter notes.     Lucas  Francyne, MD

## 2024-01-31 ENCOUNTER — Encounter: Payer: Self-pay | Admitting: Internal Medicine

## 2024-01-31 NOTE — Progress Notes (Unsigned)
    Subjective:    Patient ID: Carol [name], female    DOB: 1950-08-13, 73 y.o.   MRN: [id number]      HPI Rhyse is here for No chief complaint on file.        Medications and allergies reviewed with patient and updated if appropriate.  Current Outpatient Medications on File Prior to Visit  Medication Sig Dispense Refill   albuterol  (PROAIR  HFA) 108 (90 Base) MCG/ACT inhaler INHALE 2 PUFFS INTO THE LUNGS EVERY 6 HOURS AS NEEDED FOR WHEEZING/ SHORTNESS OF BREATH. 8.5 g 0   Apoaequorin (PREVAGEN) 10 MG CAPS Take by mouth in the morning.     atorvastatin  (LIPITOR) 40 MG tablet Take 1 tablet (40 mg total) by mouth daily. 90 tablet 3   diclofenac sodium (VOLTAREN) 1 % GEL Apply 2 g topically 4 (four) times daily.     DOXYCYCLINE  HYCLATE PO Take 100 mg by mouth as needed.     ibandronate  (BONIVA ) 150 MG tablet Take in the morning with a full glass of water  once a month, on an empty stomach, do not take anything else by mouth or lie down for the next 30 min. 3 tablet 3   metFORMIN  (GLUCOPHAGE -XR) 500 MG 24 hr tablet Take 1 tablet (500 mg total) by mouth 2 (two) times daily with a meal. 180 tablet 0   methocarbamol (ROBAXIN) 500 MG tablet Take 500 mg by mouth daily as needed (migraine).     Multiple Vitamins-Minerals (CENTRUM SILVER 50+WOMEN PO) Take by mouth in the morning.     NON FORMULARY Viviscal     pantoprazole  (PROTONIX ) 40 MG tablet Take 1 tablet (40 mg total) by mouth daily. 90 tablet 3   valACYclovir  (VALTREX ) 500 MG tablet Take 1 tablet (500 mg total) by mouth daily. 90 tablet 3   zonisamide  (ZONEGRAN ) 100 MG capsule Take 100 mg by mouth 2 (two) times daily.     No current facility-administered medications on file prior to visit.    Review of Systems     Objective:  There were no vitals filed for this visit. BP Readings from Last 3 Encounters:  01/19/24 123/84  12/22/23 116/76  12/18/23 110/72   Wt Readings from Last 3 Encounters:  01/19/24 155 lb (70.3  kg)  12/22/23 154 lb (69.9 kg)  12/18/23 157 lb (71.2 kg)   There is no height or weight on file to calculate BMI.    Physical Exam         Assessment & Plan:    See Problem List for Assessment and Plan of chronic medical problems.

## 2024-02-01 ENCOUNTER — Ambulatory Visit (INDEPENDENT_AMBULATORY_CARE_PROVIDER_SITE_OTHER): Admitting: Internal Medicine

## 2024-02-01 VITALS — BP 118/88 | HR 88 | Temp 97.9°F | Ht 60.0 in | Wt 157.0 lb

## 2024-02-01 DIAGNOSIS — R197 Diarrhea, unspecified: Secondary | ICD-10-CM | POA: Diagnosis not present

## 2024-02-01 DIAGNOSIS — R350 Frequency of micturition: Secondary | ICD-10-CM | POA: Insufficient documentation

## 2024-02-01 DIAGNOSIS — R1032 Left lower quadrant pain: Secondary | ICD-10-CM | POA: Diagnosis not present

## 2024-02-01 LAB — AMYLASE: Amylase: 52 U/L (ref 27–131)

## 2024-02-01 LAB — COMPREHENSIVE METABOLIC PANEL WITH GFR
ALT: 23 U/L (ref 0–35)
AST: 20 U/L (ref 0–37)
Albumin: 4.4 g/dL (ref 3.5–5.2)
Alkaline Phosphatase: 88 U/L (ref 39–117)
BUN: 22 mg/dL (ref 6–23)
CO2: 24 meq/L (ref 19–32)
Calcium: 9.6 mg/dL (ref 8.4–10.5)
Chloride: 106 meq/L (ref 96–112)
Creatinine, Ser: 0.91 mg/dL (ref 0.40–1.20)
GFR: 63.85 mL/min (ref >60.00)
Glucose, Bld: 102 mg/dL — ABNORMAL HIGH (ref 70–99)
Potassium: 4.4 meq/L (ref 3.5–5.1)
Sodium: 139 meq/L (ref 135–145)
Total Bilirubin: 0.3 mg/dL (ref 0.2–1.2)
Total Protein: 7.4 g/dL (ref 6.0–8.3)

## 2024-02-01 LAB — CBC WITH DIFFERENTIAL/PLATELET
Basophils Absolute: 0 K/uL (ref 0.0–0.1)
Basophils Relative: 0.4 % (ref 0.0–3.0)
Eosinophils Absolute: 0.2 K/uL (ref 0.0–0.7)
Eosinophils Relative: 2.3 % (ref 0.0–5.0)
HCT: 41.3 % (ref 36.0–46.0)
Hemoglobin: 13.8 g/dL (ref 12.0–15.0)
Lymphocytes Relative: 21.8 % (ref 12.0–46.0)
Lymphs Abs: 2 K/uL (ref 0.7–4.0)
MCHC: 33.4 g/dL (ref 30.0–36.0)
MCV: 84.7 fl (ref 78.0–100.0)
Monocytes Absolute: 0.7 K/uL (ref 0.1–1.0)
Monocytes Relative: 7.2 % (ref 3.0–12.0)
Neutro Abs: 6.3 K/uL (ref 1.4–7.7)
Neutrophils Relative %: 68.3 % (ref 43.0–77.0)
Platelets: 348 K/uL (ref 150.0–400.0)
RBC: 4.87 Mil/uL (ref 3.87–5.11)
RDW: 13.5 % (ref 11.5–15.5)
WBC: 9.3 K/uL (ref 4.0–10.5)

## 2024-02-01 LAB — LIPASE: Lipase: 23 U/L (ref 11.0–59.0)

## 2024-02-01 MED ORDER — AMOXICILLIN-POT CLAVULANATE 875-125 MG PO TABS: 1.0000 | ORAL_TABLET | Freq: Two times a day (BID) | ORAL | 0 refills | Status: AC

## 2024-02-01 NOTE — Patient Instructions (Addendum)
      Blood work was ordered.       Medications changes include :   Augmentin  twice daily x 10 days.     A abdominal ct scan was ordered and someone will call you to schedule an appointment.     Return if symptoms worsen or fail to improve.

## 2024-02-01 NOTE — Assessment & Plan Note (Addendum)
 Acute Tender on exam Having some lower abdominal pain intermittently for a month 4 days of diarrhea, having fevers, chills, fatigue, nausea, decreased appetite Concern for diverticulitis given history of diverticulitis UTI, renal stone, bowel obstruction less likely CT abdomen pelvis without contrast given allergy to contrast CBC, CMP, amylase, lipase, UA, urine culture  Since we will not be able to get CT scan today will empirically treat for diverticulitis with Augmentin  875-125 mg twice daily x 10 days.  CT scan should be able to be done tomorrow.  Advised that if her pain worsens or she has any other concerning symptoms she may need to be seen at the emergency room tonight.

## 2024-02-01 NOTE — Assessment & Plan Note (Signed)
 Acute Has had some increase in urinary frequency, urinary urgency with difficulty urinating at times in addition to lower abdominal pain, fevers, chills, fatigue, nausea and decreased appetite Will rule out UTI UA, urine culture

## 2024-02-01 NOTE — Assessment & Plan Note (Addendum)
 Acute X 4 days No blood in stool, no melena Having lower abdominal cramping, nausea.  Has had some fever, chills, fatigue and decreased appetite History of diverticulitis, which her symptoms are concerning for Infectious diarrhea less likely, UTI less likely CT abdomen without contrast given her allergy CBC, CMP, amylase, lipase

## 2024-02-02 ENCOUNTER — Ambulatory Visit (HOSPITAL_BASED_OUTPATIENT_CLINIC_OR_DEPARTMENT_OTHER)
Admission: RE | Admit: 2024-02-02 | Discharge: 2024-02-02 | Disposition: A | Discharge status: Home / Self Care | Source: Ambulatory Visit | Attending: Internal Medicine | Admitting: Internal Medicine

## 2024-02-02 ENCOUNTER — Ambulatory Visit: Payer: Self-pay | Admitting: Internal Medicine

## 2024-02-02 ENCOUNTER — Telehealth: Payer: Self-pay

## 2024-02-02 ENCOUNTER — Encounter: Payer: Self-pay | Admitting: Internal Medicine

## 2024-02-02 DIAGNOSIS — K5730 Diverticulosis of large intestine without perforation or abscess without bleeding: Secondary | ICD-10-CM | POA: Diagnosis not present

## 2024-02-02 DIAGNOSIS — R1032 Left lower quadrant pain: Secondary | ICD-10-CM | POA: Diagnosis not present

## 2024-02-02 DIAGNOSIS — Z9049 Acquired absence of other specified parts of digestive tract: Secondary | ICD-10-CM | POA: Diagnosis not present

## 2024-02-02 DIAGNOSIS — N200 Calculus of kidney: Secondary | ICD-10-CM | POA: Diagnosis not present

## 2024-02-02 LAB — URINALYSIS, ROUTINE W REFLEX MICROSCOPIC
Bilirubin Urine: NEGATIVE
Hgb urine dipstick: NEGATIVE
Ketones, ur: NEGATIVE
Nitrite: POSITIVE — AB
RBC / HPF: NONE SEEN (ref 0–?)
Specific Gravity, Urine: 1.015 (ref 1.000–1.030)
Total Protein, Urine: NEGATIVE
Urine Glucose: NEGATIVE
Urobilinogen, UA: 0.2 (ref 0.0–1.0)
pH: 6 (ref 5.0–8.0)

## 2024-02-02 NOTE — Addendum Note (Signed)
 Addended by: GEOFM GLADE PARAS on: 02/02/2024 10:07 AM   Modules accepted: Orders

## 2024-02-02 NOTE — Telephone Encounter (Signed)
 Updated - not getting contrast

## 2024-02-02 NOTE — Telephone Encounter (Signed)
 Copied from CRM [id number]. Topic: Clinical - Request for Lab/Test Order >> Feb 02, 2024  8:51 AM Berneda FALCON wrote: Reason for CRM: Pasley from [facility] calling to  let PCP know the Cat Scan needs to be done at the hospital. States that the patient indicated that the oral contrast makes her throat swell which means they are not able to do it there and that it needs to be completed at the hospital instead.

## 2024-02-03 ENCOUNTER — Other Ambulatory Visit

## 2024-02-04 LAB — URINE CULTURE

## 2024-02-16 ENCOUNTER — Ambulatory Visit (INDEPENDENT_AMBULATORY_CARE_PROVIDER_SITE_OTHER): Admitting: Internal Medicine

## 2024-02-16 ENCOUNTER — Encounter (INDEPENDENT_AMBULATORY_CARE_PROVIDER_SITE_OTHER): Payer: Self-pay | Admitting: Internal Medicine

## 2024-02-16 VITALS — BP 125/83 | HR 80 | Temp 97.9°F | Ht 60.0 in | Wt 153.0 lb

## 2024-02-16 DIAGNOSIS — E66811 Obesity, class 1: Secondary | ICD-10-CM | POA: Diagnosis not present

## 2024-02-16 DIAGNOSIS — Z6831 Body mass index (BMI) 31.0-31.9, adult: Secondary | ICD-10-CM | POA: Diagnosis not present

## 2024-02-16 DIAGNOSIS — R7303 Prediabetes: Secondary | ICD-10-CM | POA: Diagnosis not present

## 2024-02-16 DIAGNOSIS — E785 Hyperlipidemia, unspecified: Secondary | ICD-10-CM | POA: Diagnosis not present

## 2024-02-16 NOTE — Progress Notes (Signed)
 Office: [phone]  /  Fax: [phone]  Weight Summary and Body Composition Analysis (BIA)  Vitals Temp: 97.9 F (36.6 C) BP: 125/83 Pulse Rate: 80 SpO2: 98 %   Anthropometric Measurements Height: 5' (1.524 m) Weight: 153 lb (69.4 kg) BMI (Calculated): 29.88 Weight at Last Visit: 155lb Weight Lost Since Last Visit: 2lb Weight Gained Since Last Visit: 0lb Starting Weight: 159lb Total Weight Loss (lbs): 6 lb (2.722 kg) Peak Weight: 175lb   Body Composition  Body Fat %: 40.5 % Fat Mass (lbs): 62.2 lbs Muscle Mass (lbs): 87 lbs Total Body Water  (lbs): 59.2 lbs Visceral Fat Rating : 11    RMR: 1253  Today's Visit #: 19  Starting Date: 09/16/22   Subjective   Chief Complaint: Obesity  Interval History Discussed the use of AI scribe software for clinical note transcription with the patient, who gave verbal consent to proceed.  History of Present Illness Carol [name] is a 73 year old female with hyperlipidemia and prediabetes who presents for medical weight management.  Carol [name] is currently on a low dose of phentermine  without clinical response and takes metformin  for prediabetes. Carol [name] follows a 1200 calorie nutrition plan approximately 75% of the time, tracking her calories and incorporating more whole foods and the recommended amount of protein into her diet. Carol [name] has lost two pounds over the past month.  Carol [name] engages in physical activity by exercising two days a week for 30 to 45 minutes, primarily doing Pilates and walking on off days. Carol [name] reports improved strength, noting that Carol [name] can now get up from the floor without assistance, which was previously difficult for her.  Carol [name] is experimenting with meal replacements and intermittent fasting, specifically a 16:8 fasting schedule. Carol [name] uses meal replacements at least once a week and is trying to increase this to twice a week. Carol [name] also consumes high-protein bars, specifically IQ Bars, which Carol [name] finds  satisfying and not overly expensive.  No significant cravings and Carol [name] feels satisfied with her current dietary regimen. Carol [name] is considering tracking her steps and sleep with a ring device, as Carol [name] does not wear her watch at night.  Carol [name] experiences occasional numbness in her feet, which Carol [name] associates with potential B12 deficiency due to long-term metformin  use.     Challenges affecting patient progress: orthopedic problems, medical conditions or chronic pain affecting mobility, slow metabolism for age, menopause, inadequate response to nutritional and behavioral strategies, and inadequate response to pharmacotherapy.    Pharmacotherapy for weight management: Carol [name] is currently taking Metformin  (off label use for weight management and / or insulin  resistance and / or diabetes prevention) with adequate clinical response  and without side effects..   Assessment and Plan   Treatment Plan For Obesity:  Recommended Dietary Goals  Carol [name] is currently in the action stage of change. As such, her goal is to continue weight management plan. Carol [name] has agreed to: incorporate 1-2 meal replacements a day for convenience , continue current plan, and incorporate time restricted eating 16 hours fasting with an 8 hour eating window while maintaining reduced calorie nutrition plan  Behavioral Health and Counseling  We discussed the following behavioral modification strategies today: continue to work on maintaining a reduced calorie state, getting the recommended amount of protein, incorporating whole foods, making healthy choices, staying well hydrated and practicing mindfulness when eating. and increase protein intake, fibrous foods (25 grams per day for women, 30 grams for men) and water  to improve satiety and decrease hunger signals. .  Additional  education and resources provided today: None  Recommended Physical Activity Goals  Carol [name] has been advised to work up to 150 minutes of moderate intensity aerobic  activity a week and strengthening exercises 2-3 times per week for cardiovascular health, weight loss maintenance and preservation of muscle mass.  Carol [name] has agreed to :  Think about enjoyable ways to increase daily physical activity and overcoming barriers to exercise, Increase physical activity in their day and reduce sedentary time (increase NEAT)., Increase volume of physical activity to a goal of 240 minutes a week, and Combine aerobic and strengthening exercises for efficiency and improved cardiometabolic health.  Medical Interventions and Pharmacotherapy  We discussed various medication options to help Carol [name] with her weight loss efforts and we both agreed to : Continue with current nutritional and behavioral strategies and does not have coverage for GLP-1 and did not respond to low-dose phentermine .  Carol [name] is currently on zonisamide   Associated Conditions Impacted by Obesity Treatment  Assessment & Plan Prediabetes Currently on metformin  for pharmacoprophylaxis.  Rechecking hemoglobin A1c today.  Check B12 levels due to chronic treatment with metformin   Hyperlipidemia with target LDL less than 100 LDL is not at goal. Elevated LDL may be secondary to nutrition, genetics and spillover effect from excess adiposity. Recommended LDL goal is <70 to reduce the risk of fatty streaks and the progression to obstructive ASCVD in the future.   Her 10 year risk is: The 10-year ASCVD risk score (Arnett DK, et al., 2019) is: 8.9%  Lab Results  Component Value Date   CHOL 190 07/23/2023   HDL 56 07/23/2023   LDLCALC 106 (H) 07/23/2023   TRIG 163 (H) 07/23/2023   CHOLHDL 3.4 07/23/2023   Carol [name] is currently on atorvastatin  40 mg daily.  We will check fasting lipid profile today    Class 1 obesity with serious comorbidity and body mass index (BMI) of 31.0 to 31.9 in adult, unspecified obesity type Weight: decrease of 11 lb (6.7%) over 1 year, 6 months  Start: 08/05/2022 164 lb (74.4 kg)  End:  02/16/2024 153 lb (69.4 kg)   Overweight with associated hyperlipidemia and prediabetes Carol [name] adheres to a 1200 calorie nutrition plan 75% of the time, focusing on whole foods and recommended protein intake. Engages in exercise twice weekly for 30-45 minutes, resulting in a two-pound weight loss over the past month. Currently on metformin  for prediabetes and atorvastatin  for hyperlipidemia. BMI is 29, indicating Carol [name] is no longer in the obesity range. Satisfied with her dietary regimen, including meal replacements and intermittent fasting, with no significant cravings. Considering using a ring to track steps and sleep to enhance physical activity levels. - Encourage continuation of 1200 calorie nutrition plan with meal replacements and intermittent fasting. - Increase physical activity to 240 minutes per week, including Pilates and walking. - Encourage tracking of steps and sleep using a ring. - Perform fasting labs today to check A1c, cholesterol, and B12 levels.          Objective   Physical Exam:  Blood pressure 125/83, pulse 80, temperature 97.9 F (36.6 C), height 5' (1.524 m), weight 153 lb (69.4 kg), last menstrual period 06/23/1990, SpO2 98%. Body mass index is 29.88 kg/m.  General: Carol [name] is overweight, cooperative, alert, well developed, and in no acute distress. PSYCH: Has normal mood, affect and thought process.   HEENT: EOMI, sclerae are anicteric. Lungs: Normal breathing effort, no conversational dyspnea. Extremities: No edema.  Neurologic: No gross sensory or motor deficits. No tremors or fasciculations  noted.    Diagnostic Data Reviewed:  BMET    Component Value Date/Time   NA 139 02/01/2024 1600   NA 141 07/23/2023 1133   K 4.4 02/01/2024 1600   CL 106 02/01/2024 1600   CO2 24 02/01/2024 1600   GLUCOSE 102 (H) 02/01/2024 1600   BUN 22 02/01/2024 1600   BUN 16 07/23/2023 1133   CREATININE 0.91 02/01/2024 1600   CREATININE 0.76 03/16/2017 1354   CALCIUM  9.6  02/01/2024 1600   GFRNONAA >60 09/21/2023 1552   GFRAA 79 10/21/2019 1326   Lab Results  Component Value Date   HGBA1C 5.8 (A) 07/23/2023   HGBA1C 5.6 02/22/2013   Lab Results  Component Value Date   INSULIN  11.1 09/16/2022   Lab Results  Component Value Date   TSH 2.380 08/18/2022   CBC    Component Value Date/Time   WBC 9.3 02/01/2024 1600   RBC 4.87 02/01/2024 1600   HGB 13.8 02/01/2024 1600   HGB 14.3 07/23/2023 1133   HGB 14.3 05/21/2016 1128   HCT 41.3 02/01/2024 1600   HCT 44.1 07/23/2023 1133   PLT 348.0 02/01/2024 1600   PLT 313 07/23/2023 1133   MCV 84.7 02/01/2024 1600   MCV 86 07/23/2023 1133   MCH 27.9 09/21/2023 1552   MCHC 33.4 02/01/2024 1600   RDW 13.5 02/01/2024 1600   RDW 13.6 07/23/2023 1133   Iron Studies    Component Value Date/Time   IRON 47 10/22/2022 1430   TIBC 323 10/22/2022 1430   FERRITIN 166 (H) 10/22/2022 1430   IRONPCTSAT 15 10/22/2022 1430   Lipid Panel     Component Value Date/Time   CHOL 190 07/23/2023 1133   TRIG 163 (H) 07/23/2023 1133   HDL 56 07/23/2023 1133   CHOLHDL 3.4 07/23/2023 1133   CHOLHDL 3.2 05/21/2016 1407   VLDL 44 (H) 05/21/2016 1407   LDLCALC 106 (H) 07/23/2023 1133   Hepatic Function Panel     Component Value Date/Time   PROT 7.4 02/01/2024 1600   PROT 6.8 07/23/2023 1133   ALBUMIN  4.4 02/01/2024 1600   ALBUMIN  4.5 07/23/2023 1133   AST 20 02/01/2024 1600   ALT 23 02/01/2024 1600   ALKPHOS 88 02/01/2024 1600   BILITOT 0.3 02/01/2024 1600   BILITOT 0.2 07/23/2023 1133      Component Value Date/Time   TSH 2.380 08/18/2022 1310   Nutritional Lab Results  Component Value Date   VD25OH 51.2 04/23/2022   VD25OH 39.4 05/27/2017   VD25OH 31 05/21/2016    Medications: Outpatient Encounter Medications as of 02/16/2024  Medication Sig   albuterol  (PROAIR  HFA) 108 (90 Base) MCG/ACT inhaler INHALE 2 PUFFS INTO THE LUNGS EVERY 6 HOURS AS NEEDED FOR WHEEZING/ SHORTNESS OF BREATH.   Apoaequorin  (PREVAGEN) 10 MG CAPS Take by mouth in the morning.   atorvastatin  (LIPITOR) 40 MG tablet Take 1 tablet (40 mg total) by mouth daily.   diclofenac sodium (VOLTAREN) 1 % GEL Apply 2 g topically 4 (four) times daily.   DOXYCYCLINE  HYCLATE PO Take 100 mg by mouth as needed.   ibandronate  (BONIVA ) 150 MG tablet Take in the morning with a full glass of water  once a month, on an empty stomach, do not take anything else by mouth or lie down for the next 30 min.   metFORMIN  (GLUCOPHAGE -XR) 500 MG 24 hr tablet Take 1 tablet (500 mg total) by mouth 2 (two) times daily with a meal.   methocarbamol (ROBAXIN) 500 MG  tablet Take 500 mg by mouth daily as needed (migraine).   Multiple Vitamins-Minerals (CENTRUM SILVER 50+WOMEN PO) Take by mouth in the morning.   NON FORMULARY Viviscal   pantoprazole  (PROTONIX ) 40 MG tablet Take 1 tablet (40 mg total) by mouth daily.   valACYclovir  (VALTREX ) 500 MG tablet Take 1 tablet (500 mg total) by mouth daily.   zonisamide  (ZONEGRAN ) 100 MG capsule Take 100 mg by mouth 2 (two) times daily.   No facility-administered encounter medications on file as of 02/16/2024.     Follow-Up   Return in about 6 weeks (around 03/29/2024).SABRA Carol [name] was informed of the importance of frequent follow up visits to maximize her success with intensive lifestyle modifications for her multiple health conditions.  Attestation Statement   Reviewed by clinician on day of visit: allergies, medications, problem list, medical history, surgical history, family history, social history, and previous encounter notes.     Lucas Parker, MD

## 2024-02-16 NOTE — Assessment & Plan Note (Signed)
 Weight: decrease of 11 lb (6.7%) over 1 year, 6 months  Start: 08/05/2022 164 lb (74.4 kg)  End: 02/16/2024 153 lb (69.4 kg)   Overweight with associated hyperlipidemia and prediabetes She adheres to a 1200 calorie nutrition plan 75% of the time, focusing on whole foods and recommended protein intake. Engages in exercise twice weekly for 30-45 minutes, resulting in a two-pound weight loss over the past month. Currently on metformin  for prediabetes and atorvastatin  for hyperlipidemia. BMI is 29, indicating she is no longer in the obesity range. Satisfied with her dietary regimen, including meal replacements and intermittent fasting, with no significant cravings. Considering using a ring to track steps and sleep to enhance physical activity levels. - Encourage continuation of 1200 calorie nutrition plan with meal replacements and intermittent fasting. - Increase physical activity to 240 minutes per week, including Pilates and walking. - Encourage tracking of steps and sleep using a ring. - Perform fasting labs today to check A1c, cholesterol, and B12 levels.

## 2024-02-16 NOTE — Assessment & Plan Note (Signed)
 LDL is not at goal. Elevated LDL may be secondary to nutrition, genetics and spillover effect from excess adiposity. Recommended LDL goal is <70 to reduce the risk of fatty streaks and the progression to obstructive ASCVD in the future.   Her 10 year risk is: The 10-year ASCVD risk score (Arnett DK, et al., 2019) is: 8.9%  Lab Results  Component Value Date   CHOL 190 07/23/2023   HDL 56 07/23/2023   LDLCALC 106 (H) 07/23/2023   TRIG 163 (H) 07/23/2023   CHOLHDL 3.4 07/23/2023   She is currently on atorvastatin  40 mg daily.  We will check fasting lipid profile today

## 2024-02-16 NOTE — Assessment & Plan Note (Signed)
 Currently on metformin  for pharmacoprophylaxis.  Rechecking hemoglobin A1c today.  Check B12 levels due to chronic treatment with metformin 

## 2024-02-17 ENCOUNTER — Ambulatory Visit (INDEPENDENT_AMBULATORY_CARE_PROVIDER_SITE_OTHER): Payer: Self-pay | Admitting: Internal Medicine

## 2024-02-17 DIAGNOSIS — C44319 Basal cell carcinoma of skin of other parts of face: Secondary | ICD-10-CM | POA: Diagnosis not present

## 2024-02-17 DIAGNOSIS — Z1283 Encounter for screening for malignant neoplasm of skin: Secondary | ICD-10-CM | POA: Diagnosis not present

## 2024-02-17 DIAGNOSIS — D225 Melanocytic nevi of trunk: Secondary | ICD-10-CM | POA: Diagnosis not present

## 2024-02-17 DIAGNOSIS — C4441 Basal cell carcinoma of skin of scalp and neck: Secondary | ICD-10-CM | POA: Diagnosis not present

## 2024-02-17 LAB — LIPID PANEL WITH LDL/HDL RATIO
Cholesterol, Total: 186 mg/dL (ref 100–199)
HDL: 47 mg/dL (ref >39)
LDL Chol Calc (NIH): 105 mg/dL — ABNORMAL HIGH (ref 0–99)
LDL/HDL Ratio: 2.2 ratio (ref 0.0–3.2)
Triglycerides: 196 mg/dL — ABNORMAL HIGH (ref 0–149)
VLDL Cholesterol Cal: 34 mg/dL (ref 5–40)

## 2024-02-17 LAB — HEMOGLOBIN A1C
Est. average glucose Bld gHb Est-mCnc: 120 mg/dL
Hgb A1c MFr Bld: 5.8 % — ABNORMAL HIGH (ref 4.8–5.6)

## 2024-02-17 LAB — VITAMIN B12: Vitamin B-12: 505 pg/mL (ref 232–1245)

## 2024-02-19 ENCOUNTER — Encounter: Payer: Self-pay | Admitting: Internal Medicine

## 2024-02-20 ENCOUNTER — Telehealth

## 2024-02-20 ENCOUNTER — Telehealth: Admitting: Physician Assistant

## 2024-02-20 DIAGNOSIS — R3989 Other symptoms and signs involving the genitourinary system: Secondary | ICD-10-CM | POA: Diagnosis not present

## 2024-02-20 MED ORDER — CEPHALEXIN 500 MG PO CAPS: 500.0000 mg | ORAL_CAPSULE | Freq: Two times a day (BID) | ORAL | 0 refills | Status: AC

## 2024-02-20 NOTE — Patient Instructions (Signed)
 Carol [name], thank you for joining Carol Velma Lunger, Carol [name] for today's virtual visit.  While this provider is not your primary care provider (PCP), if your PCP is located in our provider database this encounter information will be shared with them immediately following your visit.   A [facility] MyChart account gives you access to today's visit and all your visits, tests, and labs performed at [facility]  click here if you don't have a [facility] MyChart account or go to [email or web address]  Consent: (Patient) Carol [name] provided verbal consent for this virtual visit at the beginning of the encounter.  Current Medications:  Current Outpatient Medications:    albuterol  (PROAIR  HFA) 108 (90 Base) MCG/ACT inhaler, INHALE 2 PUFFS INTO THE LUNGS EVERY 6 HOURS AS NEEDED FOR WHEEZING/ SHORTNESS OF BREATH., Disp: 8.5 g, Rfl: 0   Apoaequorin (PREVAGEN) 10 MG CAPS, Take by mouth in the morning., Disp: , Rfl:    atorvastatin  (LIPITOR) 40 MG tablet, Take 1 tablet (40 mg total) by mouth daily., Disp: 90 tablet, Rfl: 3   diclofenac sodium (VOLTAREN) 1 % GEL, Apply 2 g topically 4 (four) times daily., Disp: , Rfl:    DOXYCYCLINE  HYCLATE PO, Take 100 mg by mouth as needed., Disp: , Rfl:    ibandronate  (BONIVA ) 150 MG tablet, Take in the morning with a full glass of water  once a month, on an empty stomach, do not take anything else by mouth or lie down for the next 30 min., Disp: 3 tablet, Rfl: 3   metFORMIN  (GLUCOPHAGE -XR) 500 MG 24 hr tablet, Take 1 tablet (500 mg total) by mouth 2 (two) times daily with a meal., Disp: 180 tablet, Rfl: 0   methocarbamol (ROBAXIN) 500 MG tablet, Take 500 mg by mouth daily as needed (migraine)., Disp: , Rfl:    Multiple Vitamins-Minerals (CENTRUM SILVER 50+WOMEN PO), Take by mouth in the morning., Disp: , Rfl:    NON FORMULARY, Viviscal, Disp: , Rfl:    pantoprazole  (PROTONIX ) 40 MG tablet, Take 1 tablet (40 mg total) by mouth  daily., Disp: 90 tablet, Rfl: 3   valACYclovir  (VALTREX ) 500 MG tablet, Take 1 tablet (500 mg total) by mouth daily., Disp: 90 tablet, Rfl: 3   zonisamide  (ZONEGRAN ) 100 MG capsule, Take 100 mg by mouth 2 (two) times daily., Disp: , Rfl:    Medications ordered in this encounter:  No orders of the defined types were placed in this encounter.    *If you need refills on other medications prior to your next appointment, please contact your pharmacy*  Follow-Up: Call back or seek an in-person evaluation if the symptoms worsen or if the condition fails to improve as anticipated.  [facility] [phone]  Other Instructions Your symptoms are consistent with a bladder infection, also called acute cystitis. Please take your antibiotic (Keflex ) as directed until all pills are gone.  Stay very well hydrated.  Consider a daily probiotic (Align, Culturelle, or Activia) to help prevent stomach upset caused by the antibiotic.  Taking a probiotic daily may also help prevent recurrent UTIs.  Also consider taking AZO (Phenazopyridine ) tablets to help decrease pain with urination.   If you note any non-resolving, new, or worsening symptoms despite treatment, please seek an in-person evaluation ASAP.   Urinary Tract Infection A urinary tract infection (UTI) can occur any place along the urinary tract. The tract includes the kidneys, ureters, bladder, and urethra. A type of germ called bacteria often causes a UTI. UTIs  are often helped with antibiotic medicine.  HOME CARE  If given, take antibiotics as told by your doctor. Finish them even if you start to feel better. Drink enough fluids to keep your pee (urine) clear or pale yellow. Avoid tea, drinks with caffeine, and bubbly (carbonated) drinks. Pee often. Avoid holding your pee in for a long time. Pee before and after having sex (intercourse). Wipe from front to back after you poop (bowel movement) if you are a woman. Use each tissue only  once. GET HELP RIGHT AWAY IF:  You have back pain. You have lower belly (abdominal) pain. You have chills. You feel sick to your stomach (nauseous). You throw up (vomit). Your burning or discomfort with peeing does not go away. You have a fever. Your symptoms are not better in 3 days. MAKE SURE YOU:  Understand these instructions. Will watch your condition. Will get help right away if you are not doing well or get worse. Document Released: 11/26/2007 Document Revised: 03/03/2012 Document Reviewed: 01/08/2012 [facility] Patient Information 2015 [employer], [place]. This information is not intended to replace advice given to you by your health care provider. Make sure you discuss any questions you have with your health care provider.    If you have been instructed to have an in-person evaluation today at a local Urgent Care facility, please use the link below. It will take you to a list of all of our available [facility], including address, phone number and hours of operation. Please do not delay care.  [facility]  If you or a family member do not have a primary care provider, use the link below to schedule a visit and establish care. When you choose a [facility] primary care physician or advanced practice provider, you gain a long-term partner in health. Find a Primary Care Provider  Learn more about [facility]'s in-office and virtual care options:  - Get Care Now

## 2024-02-20 NOTE — Addendum Note (Signed)
 Addended by: Annette Liotta M on: 02/20/2024 05:07 PM   Modules accepted: Orders

## 2024-02-20 NOTE — Progress Notes (Signed)
 Pt created 2nd video appt for the day because antibiotics were not sent in as promised. I have prescribed keflex  for a uti per [facility]. Gladis, PA's notes.   Jon Belt, PhD, FNP-BC [facility]

## 2024-02-20 NOTE — Progress Notes (Signed)
 Virtual Visit Consent   Carol [name], you are scheduled for a virtual visit with a [facility] provider today. Just as with appointments in the office, your consent must be obtained to participate. Your consent will be active for this visit and any virtual visit you may have with one of our providers in the next 365 days. If you have a MyChart account, a copy of this consent can be sent to you electronically.  As this is a virtual visit, video technology does not allow for your provider to perform a traditional examination. This may limit your provider's ability to fully assess your condition. If your provider identifies any concerns that need to be evaluated in person or the need to arrange testing (such as labs, EKG, etc.), we will make arrangements to do so. Although advances in technology are sophisticated, we cannot ensure that it will always work on either your end or our end. If the connection with a video visit is poor, the visit may have to be switched to a telephone visit. With either a video or telephone visit, we are not always able to ensure that we have a secure connection.  By engaging in this virtual visit, you consent to the provision of healthcare and authorize for your insurance to be billed (if applicable) for the services provided during this visit. Depending on your insurance coverage, you may receive a charge related to this service.  I need to obtain your verbal consent now. Are you willing to proceed with your visit today? Carol [name] has provided verbal consent on 02/20/2024 for a virtual visit (video or telephone). Carol [name], [place]  Date: 02/20/2024 12:41 PM   Virtual Visit via Video Note   I, Carol [name], connected with  Carol [name]  ([id number], 28-Nov-1950) on 02/20/24 at 12:30 PM EDT by a video-enabled telemedicine application and verified that I am speaking with the correct person using two  identifiers.  Location: Patient: Virtual Visit Location Patient: Home Provider: Virtual Visit Location Provider: Home Office   I discussed the limitations of evaluation and management by telemedicine and the availability of in person appointments. The patient expressed understanding and agreed to proceed.    History of Present Illness: Carol [name] is a 73 y.o. who identifies as a female who was assigned female at birth, and is being seen today for possible UTI. Endorses symptoms starting yesterday with burning, urgency and frequency which have continued into today with mild nausea. Was evaluated by PCP office 2.5 weeks ago and diagnosed with uncomplicated Klebsiella UTI, treated with Augmentin . Notes complete resolution of symptoms at that time. She reached out to PCP office yesterday but they could not get back to her until end of day. With it being the weekend and office closed for holiday Monday as well, she wanted to get this taken care of ASAP if possible.    HPI: HPI  Problems:  Patient Active Problem List   Diagnosis Date Noted   LLQ pain 02/01/2024   Diarrhea 02/01/2024   Urinary frequency 02/01/2024   Obesity (BMI 30.0-34.9) 12/18/2023   Sleep difficulties 09/07/2023   Osteoporosis 05/14/2023   Brittle nails 10/22/2022   Prediabetes 09/16/2022   Tremor 05/07/2021   Class 1 obesity with serious comorbidity and body mass index (BMI) of 31.0 to 31.9 in adult 05/07/2021   Thrombocytopenia (HCC) 05/04/2021   History of renal stone 05/24/2020   Migraine headache 12/14/2018   History of DVT of lower extremity 10/23/2017  Nocturnal muscle cramps 10/23/2017   History of colonic polyps 05/21/2016   S/P laparoscopic cholecystectomy March 2015 09/03/2013   Hyperlipidemia with target LDL less than 100 06/27/2011   Herpes 12/16/2010   GERD (gastroesophageal reflux disease) 12/16/2010   HH (hiatus hernia) 12/16/2010   Hot flashes, menopausal 12/16/2010   Diverticulosis  12/16/2010   Asthma in adult, mild intermittent, uncomplicated 12/16/2010   BCE (basal cell epithelioma), 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    Allergies:  Allergies  Allergen Reactions   Iohexol  Anaphylaxis     Code: HIVES, Desc: hives during ivp '96, ok w/ 13 hr prep @ [facility] Onset Date: [id number]   Desc: hives    Ivp Dye [Iodinated Contrast Media] Anaphylaxis   Codeine Nausea And Vomiting   Onion Other (See Comments)    Red Onion Only - Nausea, Vomiting and Passes Out   Medications:  Current Outpatient Medications:    albuterol  (PROAIR  HFA) 108 (90 Base) MCG/ACT inhaler, INHALE 2 PUFFS INTO THE LUNGS EVERY 6 HOURS AS NEEDED FOR WHEEZING/ SHORTNESS OF BREATH., Disp: 8.5 g, Rfl: 0   Apoaequorin (PREVAGEN) 10 MG CAPS, Take by mouth in the morning., Disp: , Rfl:    atorvastatin  (LIPITOR) 40 MG tablet, Take 1 tablet (40 mg total) by mouth daily., Disp: 90 tablet, Rfl: 3   diclofenac sodium (VOLTAREN) 1 % GEL, Apply 2 g topically 4 (four) times daily., Disp: , Rfl:    ibandronate  (BONIVA ) 150 MG tablet, Take in the morning with a full glass of water  once a month, on an empty stomach, do not take anything else by mouth or lie down for the next 30 min., Disp: 3 tablet, Rfl: 3   metFORMIN  (GLUCOPHAGE -XR) 500 MG 24 hr tablet, Take 1 tablet (500 mg total) by mouth 2 (two) times daily with a meal., Disp: 180 tablet, Rfl: 0   methocarbamol (ROBAXIN) 500 MG tablet, Take 500 mg by mouth daily as needed (migraine)., Disp: , Rfl:    Multiple Vitamins-Minerals (CENTRUM SILVER 50+WOMEN PO), Take by mouth in the morning., Disp: , Rfl:    pantoprazole  (PROTONIX ) 40 MG tablet, Take 1 tablet (40 mg total) by mouth daily., Disp: 90 tablet, Rfl: 3   valACYclovir  (VALTREX ) 500 MG tablet, Take 1 tablet (500 mg total) by mouth daily., Disp: 90 tablet, Rfl: 3   zonisamide  (ZONEGRAN ) 100 MG capsule, Take 100 mg by mouth 2 (two) times daily., Disp: , Rfl:   Observations/Objective: Patient is well-developed, well-nourished in no  acute distress.  Resting comfortably  at home.  Head is normocephalic, atraumatic.  No labored breathing.  Speech is clear and coherent with logical content.  Patient is alert and oriented at baseline.   Assessment and Plan: 1. Suspected UTI (Primary)  Recent treatment for Klebsiella UTI with resolution of symptoms. Culture reviewed to help guide treatment options for current symptoms since she cannot be evaluated at PCP office and leery of UC/ER. Keflex  per orders. Strict ER precautions reviewed for any non-resolving, new or worsening symptoms despite treatment.   Follow Up Instructions: I discussed the assessment and treatment plan with the patient. The patient was provided an opportunity to ask questions and all were answered. The patient agreed with the plan and demonstrated an understanding of the instructions.  A copy of instructions were sent to the patient via MyChart unless otherwise noted below.    The patient was advised to call back or seek an in-person evaluation if the symptoms worsen or if the condition fails to improve as anticipated.  Carol Velma Lunger, PA-C

## 2024-02-23 ENCOUNTER — Inpatient Hospital Stay (HOSPITAL_BASED_OUTPATIENT_CLINIC_OR_DEPARTMENT_OTHER): Admission: RE | Admit: 2024-02-23 | Source: Ambulatory Visit

## 2024-03-15 ENCOUNTER — Ambulatory Visit (INDEPENDENT_AMBULATORY_CARE_PROVIDER_SITE_OTHER): Admitting: Internal Medicine

## 2024-03-21 ENCOUNTER — Ambulatory Visit: Attending: Cardiology | Admitting: Cardiology

## 2024-03-21 ENCOUNTER — Encounter: Payer: Self-pay | Admitting: Cardiology

## 2024-03-21 VITALS — BP 128/85 | HR 100 | Resp 16 | Ht 60.0 in | Wt 158.8 lb

## 2024-03-21 DIAGNOSIS — R0789 Other chest pain: Secondary | ICD-10-CM | POA: Diagnosis not present

## 2024-03-21 DIAGNOSIS — E782 Mixed hyperlipidemia: Secondary | ICD-10-CM

## 2024-03-21 DIAGNOSIS — I700 Atherosclerosis of aorta: Secondary | ICD-10-CM

## 2024-03-21 DIAGNOSIS — R739 Hyperglycemia, unspecified: Secondary | ICD-10-CM

## 2024-03-21 NOTE — Progress Notes (Signed)
 Cardiology Office Note:  .   Date:  03/21/2024  ID:  Carol [name], DOB 07-Jan-1951, MRN [id number] PCP: Geofm Glade PARAS, MD  [facility] Providers Cardiologist:  None   History of Present Illness: .   Carol [name] is a 73 y.o. Caucasian female patient with pre-diabetes mellitus, hypertension, hypercholesterolemia, remote DVT provoked in the setting of hormone replacement therapy, kidney stones, colonic diverticulosis, aortic atherosclerosis by CT scan of the abdomen on 02/02/2024 referred to me from the emergency department due to chest pain and seen on 09/21/2023.  Chest pain was sharp, while she was in the shower, could not take a deep breath.  Clearly musculoskeletal chest discomfort and was evaluated in the emergency room.  She has not had any recurrence.  Continues to exercise regularly.  Cardiac Studies relevent.    Chest x-ray 2 views 09/11/2023: Normal sized heart. Clear lungs with normal vascularity. Unremarkable bones. Cholecystectomy clips.    Discussed the use of AI scribe software for clinical note transcription with the patient, who gave verbal consent to proceed.  History of Present Illness Carol [name] is a 73 year old female with aortic atherosclerosis who presents for follow-up after experiencing acute chest pain. She was referred by her primary care doctor for follow-up with a cardiologist after an emergency room visit.  A month ago, she experienced two sudden, severe stabbing pains in her chest while getting out of the shower, causing difficulty breathing and feelings of shakiness and dizziness. She sought emergency care, but no definitive cause was identified.  Her medical history includes previous blood clots in her legs associated with hormone replacement therapy, which she has since discontinued. She is not on anticoagulants. She takes atorvastatin , recently increased to 40 mg daily due to elevated lipid levels. A prior CT  scan revealed aortic plaque buildup.  She engages in regular physical activity, including Pilates, walking, and weight exercises. She does not smoke and has never smoked, though her parents were heavy smokers. She experiences no current chest pain or shortness of breath and maintains a regular exercise routine, with occasional consumption of fried foods.  Labs   Lab Results  Component Value Date   CHOL 186 02/16/2024   HDL 47 02/16/2024   LDLCALC 105 (H) 02/16/2024   TRIG 196 (H) 02/16/2024   CHOLHDL 3.4 07/23/2023   No results found for: LIPOA  Recent Labs    07/23/23 1133 09/21/23 1552 02/01/24 1600  NA 141 140 139  K 4.7 4.3 4.4  CL 108* 105 106  CO2 20 26 24   GLUCOSE 100* 111* 102*  BUN 16 21 22   CREATININE 0.97 0.89 0.91  CALCIUM  8.7 9.7 9.6  GFRNONAA  --  >60  --     Lab Results  Component Value Date   ALT 23 02/01/2024   AST 20 02/01/2024   ALKPHOS 88 02/01/2024   BILITOT 0.3 02/01/2024      Latest Ref Rng & Units 02/01/2024    4:00 PM 09/21/2023    3:52 PM 07/23/2023   11:33 AM  CBC  WBC 4.0 - 10.5 K/uL 9.3  8.6  8.7   Hemoglobin 12.0 - 15.0 g/dL 86.1  85.5  85.6   Hematocrit 36.0 - 46.0 % 41.3  45.3  44.1   Platelets 150.0 - 400.0 K/uL 348.0  302  313    Lab Results  Component Value Date   HGBA1C 5.8 (H) 02/16/2024    Lab Results  Component Value Date  TSH 2.380 08/18/2022    ROS  Review of Systems  Cardiovascular:  Negative for chest pain, dyspnea on exertion and leg swelling.   Physical Exam:   VS:  BP 128/85 (BP Location: Left Arm, Patient Position: Sitting, Cuff Size: Normal)   Pulse 100   Resp 16   Ht 5' (1.524 m)   Wt 158 lb 12.8 oz (72 kg)   LMP 06/23/1990 (Approximate)   SpO2 98%   BMI 31.01 kg/m    Wt Readings from Last 3 Encounters:  03/21/24 158 lb 12.8 oz (72 kg)  02/16/24 153 lb (69.4 kg)  02/01/24 157 lb (71.2 kg)    BP Readings from Last 3 Encounters:  03/21/24 128/85  02/16/24 125/83  02/01/24 118/88   Physical  Exam Constitutional:      Appearance: She is obese.  Neck:     Vascular: No carotid bruit or JVD.  Cardiovascular:     Rate and Rhythm: Normal rate and regular rhythm.     Pulses: Intact distal pulses.     Heart sounds: Normal heart sounds. No murmur heard.    No gallop.  Pulmonary:     Effort: Pulmonary effort is normal.     Breath sounds: Normal breath sounds.  Abdominal:     General: Bowel sounds are normal.     Palpations: Abdomen is soft.  Musculoskeletal:     Right lower leg: No edema.     Left lower leg: No edema.   EKG:    EKG Interpretation Date/Time:  Monday March 21 2024 09:25:28 EDT Ventricular Rate:  92 PR Interval:  162 QRS Duration:  76 QT Interval:  364 QTC Calculation: 450 R Axis:   16  Text Interpretation: EKG 03/21/2024: Normal sinus rhythm at rate of 92 bpm, low voltage precordial leads otherwise normal EKG.  Compared to 09/11/2023, no change. Confirmed by Kyelle Urbas, Jagadeesh [phone]) on 03/21/2024 9:32:51 AM    ASSESSMENT AND PLAN: .      ICD-10-CM   1. Mixed hyperlipidemia  E78.2 EKG 12-Lead     Assessment & Plan Aortic atherosclerosis Aortic atherosclerosis with plaque buildup on previous CT scan, currently asymptomatic with no acute intra-abdominal or pelvic abnormalities. Managed with atorvastatin  to control cholesterol levels and reduce cardiovascular risk. - Continue atorvastatin  40 mg once daily which was recently increased by Dr. Geofm from 20 mg which is very appropriate. - Send note to Dr. Glade Geofm regarding the visit and management plan.  Mixed hyperlipidemia Mixed hyperlipidemia with recent increase in atorvastatin  dosage from 20 mg to 40 mg due to LDL levels slightly above target. Current LDL goal is less than 100 mg/dL, with preference for levels closer to 70 mg/dL due to aortic atherosclerosis. Triglycerides likely elevated, influenced by dietary intake. - Continue atorvastatin  40 mg once daily. - Reduce starch intake and saturated  fats, including cheese and fried foods, to lower triglycerides. - Encourage regular physical activity.  Prediabetes Prediabetes with an A1c of 5.8%. Lifestyle modifications, including dietary changes and weight loss, are recommended to prevent progression to diabetes. - Reduce starch intake and avoid simple carbohydrates like potatoes and [place] fries. - Aim for a weight loss of 10-15 pounds to potentially lower A1c to normal range.  Musculoskeletal chest wall pain (resolved) Acute musculoskeletal chest wall pain experienced over a month ago, characterized by stabbing pain and difficulty breathing. Emergency department evaluation ruled out cardiac causes, and the pain was determined to be due to severe acute spasm in the chest wall or  back muscles. The pain has since resolved and is not related to cardiac issues. - Continues to exercise regularly without chest pain.  Being asymptomatic no cardiac testing indicated.   Follow up: As needed  Signed,  Gordy Bergamo, MD, [facility] 03/21/2024, 9:32 AM [facility] [address] [place], [place] [address] Phone: [phone]. Fax:  [phone]

## 2024-03-21 NOTE — Patient Instructions (Signed)
 Medication Instructions:  No medication changes were made at this visit. Continue current regimen.   *If you need a refill on your cardiac medications before your next appointment, please call your pharmacy*  Lab Work: NONE If you have labs (blood work) drawn today and your tests are completely normal, you will receive your results only by: MyChart Message (if you have MyChart) OR A paper copy in the mail If you have any lab test that is abnormal or we need to change your treatment, we will call you to review the results.  Testing/Procedures: NONE  Follow-Up: At [facility], you and your health needs are our priority.  As part of our continuing mission to provide you with exceptional heart care, our providers are all part of one team.  This team includes your primary Cardiologist (physician) and Advanced Practice Providers or APPs (Physician Assistants and Nurse Practitioners) who all work together to provide you with the care you need, when you need it.  Your next appointment:   AS NEEDED   Provider:   Gordy Bergamo, MD    We recommend signing up for the patient portal called MyChart.  Sign up information is provided on this After Visit Summary.  MyChart is used to connect with patients for Virtual Visits (Telemedicine).  Patients are able to view lab/test results, encounter notes, upcoming appointments, etc.  Non-urgent messages can be sent to your provider as well.   To learn more about what you can do with MyChart, go to [email or web address].

## 2024-03-23 ENCOUNTER — Encounter: Payer: Self-pay | Admitting: Pharmacist

## 2024-03-23 NOTE — Progress Notes (Signed)
 Pharmacy Quality Measure Review  This patient is appearing on a report for being at risk of failing the adherence measure for diabetes medications this calendar year.   Medication: Metformin  XR 500 mg Last fill date: 01/19/24 for 90 day supply  Insurance report was not up to date. No action needed at this time.   Darrelyn Drum, PharmD, BCPS, CPP Clinical Pharmacist Practitioner [facility] [phone]

## 2024-03-25 ENCOUNTER — Other Ambulatory Visit: Payer: PPO

## 2024-03-28 ENCOUNTER — Encounter (INDEPENDENT_AMBULATORY_CARE_PROVIDER_SITE_OTHER): Payer: Self-pay | Admitting: Internal Medicine

## 2024-03-28 ENCOUNTER — Ambulatory Visit (INDEPENDENT_AMBULATORY_CARE_PROVIDER_SITE_OTHER): Admitting: Internal Medicine

## 2024-03-28 VITALS — BP 129/82 | HR 69 | Temp 98.0°F | Ht 60.0 in | Wt 156.0 lb

## 2024-03-28 DIAGNOSIS — E66811 Obesity, class 1: Secondary | ICD-10-CM

## 2024-03-28 DIAGNOSIS — R7303 Prediabetes: Secondary | ICD-10-CM | POA: Diagnosis not present

## 2024-03-28 DIAGNOSIS — Z6830 Body mass index (BMI) 30.0-30.9, adult: Secondary | ICD-10-CM | POA: Diagnosis not present

## 2024-03-28 NOTE — Progress Notes (Signed)
 Office: [phone]  /  Fax: [phone]  Weight Summary and Body Composition Analysis (BIA)  Vitals Temp: 98 F (36.7 C) BP: 129/82 Pulse Rate: 69 SpO2: 98 %   Anthropometric Measurements Height: 5' (1.524 m) Weight: 156 lb (70.8 kg) BMI (Calculated): 30.47 Weight at Last Visit: 153 lb Weight Lost Since Last Visit: 0 lb Weight Gained Since Last Visit: 3 lb Starting Weight: 159 lb Total Weight Loss (lbs): 3 lb (1.361 kg) Peak Weight: 175 lb   Body Composition  Body Fat %: 42.3 % Fat Mass (lbs): 66.2 lbs Muscle Mass (lbs): 85.6 lbs Total Body Water  (lbs): 59.2 lbs Visceral Fat Rating : 12    RMR: 1253  Today's Visit #: 20  Starting Date: 09/16/22   Subjective   Chief Complaint: Obesity  Interval History Discussed the use of AI scribe software for clinical note transcription with the patient, who gave verbal consent to proceed.  History of Present Illness Carol [name] is a 73 year old female who presents for medical weight management.  Since last office visit she has gained 3 pounds.  She is having problems with weight regain since May of this year after initial weight loss of about 16 pounds.  Due to biological factors she has a metabolic rate of about 1200 cal which makes generating a deficit very difficult.  She has tried Lomaira  8 mg without clinical response.  She is currently on metformin .  She is also on zonisamide  prescribed by neurologist for migraine headaches.  She has experienced a weight gain of three pounds over the past week. Despite an increase in physical activity and improved sleep, she has noticed bloating and constipation recently. She reports taking Tylenol  PM to aid her sleep. Her sleep duration has improved from five hours to six and a half to seven hours per night, and she feels well-rested upon waking.  She mentions a fall in the past, which coincided with a period of weight regain. She has been exercising more  recently, attending 30-minute      Challenges affecting patient progress: slow metabolism for age, menopause, and metabolic adaptations associated with weight loss.    Pharmacotherapy for weight management: She is currently taking Metformin  (off label use for weight management and / or insulin  resistance and / or diabetes prevention) with adequate clinical response  and without side effects. and does not have coverage for GLP-1.   Assessment and Plan   Treatment Plan For Obesity:  Recommended Dietary Goals  Carol [name] is currently in the action stage of change. As such, her goal is to continue weight management plan. She has agreed to: follow the Category 1 plan - 1000 kcal per day and incorporate 1-2 meal replacements a day for convenience   Behavioral Health and Counseling  We discussed the following behavioral modification strategies today: increasing lean protein intake to established goals, increasing fiber rich foods, and increasing water  intake .  Additional education and resources provided today: None  Recommended Physical Activity Goals  Carol [name] has been advised to work up to 150 minutes of moderate intensity aerobic activity a week and strengthening exercises 2-3 times per week for cardiovascular health, weight loss maintenance and preservation of muscle mass.  She has agreed to :  Increase volume of physical activity to a goal of 240 minutes a week, Combine aerobic and strengthening exercises for efficiency and improved cardiometabolic health., and increase steps daily to 7000  Medical Interventions and Pharmacotherapy  We discussed various medication options to  help Carol [name] with her weight loss efforts and we both agreed to : Continue with current nutritional and behavioral strategies and we discussed possibly starting Wellbutrin next but she would like to hold off pharmacotherapy at present time.  She does not have coverage for GLP-1  Associated Conditions Impacted by Obesity  Treatment  Assessment & Plan Class 1 obesity with serious comorbidity and body mass index (BMI) of 31.0 to 31.9 in adult, unspecified obesity type Obesity with recent weight gain despite previous weight loss efforts. Current metabolic rate is low at 1253 kcal/day. Previous use of phentermine  was ineffective and caused visual changes. Insurance does not cover GLP-1 agonists. Wellbutrin discussed as an option for appetite suppression. - Increase exercise to 240 minutes per week, combining cardio and strength training. - Reduce caloric intake to 1000 kcal/day, with potential intermittent fasting (18:6) or meal distribution (200 kcal breakfast, 400 kcal lunch, 400 kcal dinner). - Track and journal dietary intake. - Consider Wellbutrin if dietary and exercise modifications are insufficient. - Increase daily steps between 5000-7000 Prediabetes Her hemoglobin A1c is at 5.8 down from previous value of 6.1 but has been staying steady at 5.8 on metformin  twice daily.  She acknowledges eating more carbs over the last few weeks.  She is aware about the carb insulin  model for obesity and will work on reducing carbs and increasing protein as part of her nutritional strategy.  Continue metformin  at the current dose for diabetes prevention.  Patient also counseled today on the use of sleep aids and anticholinergic side effects.          Objective   Physical Exam:  Blood pressure 129/82, pulse 69, temperature 98 F (36.7 C), height 5' (1.524 m), weight 156 lb (70.8 kg), last menstrual period 06/23/1990, SpO2 98%. Body mass index is 30.47 kg/m.  General: She is overweight, cooperative, alert, well developed, and in no acute distress. PSYCH: Has normal mood, affect and thought process.   HEENT: EOMI, sclerae are anicteric. Lungs: Normal breathing effort, no conversational dyspnea. Extremities: No edema.  Neurologic: No gross sensory or motor deficits. No tremors or fasciculations noted.     Diagnostic Data Reviewed:  BMET    Component Value Date/Time   NA 139 02/01/2024 1600   NA 141 07/23/2023 1133   K 4.4 02/01/2024 1600   CL 106 02/01/2024 1600   CO2 24 02/01/2024 1600   GLUCOSE 102 (H) 02/01/2024 1600   BUN 22 02/01/2024 1600   BUN 16 07/23/2023 1133   CREATININE 0.91 02/01/2024 1600   CREATININE 0.76 03/16/2017 1354   CALCIUM  9.6 02/01/2024 1600   GFRNONAA >60 09/21/2023 1552   GFRAA 79 10/21/2019 1326   Lab Results  Component Value Date   HGBA1C 5.8 (H) 02/16/2024   HGBA1C 5.6 02/22/2013   Lab Results  Component Value Date   INSULIN  11.1 09/16/2022   Lab Results  Component Value Date   TSH 2.380 08/18/2022   CBC    Component Value Date/Time   WBC 9.3 02/01/2024 1600   RBC 4.87 02/01/2024 1600   HGB 13.8 02/01/2024 1600   HGB 14.3 07/23/2023 1133   HGB 14.3 05/21/2016 1128   HCT 41.3 02/01/2024 1600   HCT 44.1 07/23/2023 1133   PLT 348.0 02/01/2024 1600   PLT 313 07/23/2023 1133   MCV 84.7 02/01/2024 1600   MCV 86 07/23/2023 1133   MCH 27.9 09/21/2023 1552   MCHC 33.4 02/01/2024 1600   RDW 13.5 02/01/2024 1600   RDW 13.6 07/23/2023  1133   Iron Studies    Component Value Date/Time   IRON 47 10/22/2022 1430   TIBC 323 10/22/2022 1430   FERRITIN 166 (H) 10/22/2022 1430   IRONPCTSAT 15 10/22/2022 1430   Lipid Panel     Component Value Date/Time   CHOL 186 02/16/2024 1147   TRIG 196 (H) 02/16/2024 1147   HDL 47 02/16/2024 1147   CHOLHDL 3.4 07/23/2023 1133   CHOLHDL 3.2 05/21/2016 1407   VLDL 44 (H) 05/21/2016 1407   LDLCALC 105 (H) 02/16/2024 1147   Hepatic Function Panel     Component Value Date/Time   PROT 7.4 02/01/2024 1600   PROT 6.8 07/23/2023 1133   ALBUMIN  4.4 02/01/2024 1600   ALBUMIN  4.5 07/23/2023 1133   AST 20 02/01/2024 1600   ALT 23 02/01/2024 1600   ALKPHOS 88 02/01/2024 1600   BILITOT 0.3 02/01/2024 1600   BILITOT 0.2 07/23/2023 1133      Component Value Date/Time   TSH 2.380 08/18/2022 1310    Nutritional Lab Results  Component Value Date   VD25OH 51.2 04/23/2022   VD25OH 39.4 05/27/2017   VD25OH 31 05/21/2016    Medications: Outpatient Encounter Medications as of 03/28/2024  Medication Sig   albuterol  (PROAIR  HFA) 108 (90 Base) MCG/ACT inhaler INHALE 2 PUFFS INTO THE LUNGS EVERY 6 HOURS AS NEEDED FOR WHEEZING/ SHORTNESS OF BREATH.   Apoaequorin (PREVAGEN) 10 MG CAPS Take by mouth in the morning.   atorvastatin  (LIPITOR) 40 MG tablet Take 1 tablet (40 mg total) by mouth daily.   diclofenac sodium (VOLTAREN) 1 % GEL Apply 2 g topically 4 (four) times daily.   ibandronate  (BONIVA ) 150 MG tablet Take in the morning with a full glass of water  once a month, on an empty stomach, do not take anything else by mouth or lie down for the next 30 min.   metFORMIN  (GLUCOPHAGE -XR) 500 MG 24 hr tablet Take 1 tablet (500 mg total) by mouth 2 (two) times daily with a meal.   methocarbamol (ROBAXIN) 500 MG tablet Take 500 mg by mouth daily as needed (migraine).   Multiple Vitamins-Minerals (CENTRUM SILVER 50+WOMEN PO) Take by mouth in the morning.   NON FORMULARY Take 1 tablet by mouth daily. Viviscal Hair growth supplement   pantoprazole  (PROTONIX ) 40 MG tablet Take 1 tablet (40 mg total) by mouth daily.   valACYclovir  (VALTREX ) 500 MG tablet Take 1 tablet (500 mg total) by mouth daily.   zonisamide  (ZONEGRAN ) 100 MG capsule Take 100 mg by mouth 2 (two) times daily.   No facility-administered encounter medications on file as of 03/28/2024.     Follow-Up   Return in about 6 weeks (around 05/09/2024) for For Weight Mangement with Dr. Francyne.SABRA She was informed of the importance of frequent follow up visits to maximize her success with intensive lifestyle modifications for her multiple health conditions.  Attestation Statement   Reviewed by clinician on day of visit: allergies, medications, problem list, medical history, surgical history, family history, social history, and previous  encounter notes.     Lucas Francyne, MD

## 2024-03-28 NOTE — Assessment & Plan Note (Signed)
 Obesity with recent weight gain despite previous weight loss efforts. Current metabolic rate is low at 1253 kcal/day. Previous use of phentermine  was ineffective and caused visual changes. Insurance does not cover GLP-1 agonists. Wellbutrin discussed as an option for appetite suppression. - Increase exercise to 240 minutes per week, combining cardio and strength training. - Reduce caloric intake to 1000 kcal/day, with potential intermittent fasting (18:6) or meal distribution (200 kcal breakfast, 400 kcal lunch, 400 kcal dinner). - Track and journal dietary intake. - Consider Wellbutrin if dietary and exercise modifications are insufficient. - Increase daily steps between 5000-7000

## 2024-03-28 NOTE — Assessment & Plan Note (Signed)
 Her hemoglobin A1c is at 5.8 down from previous value of 6.1 but has been staying steady at 5.8 on metformin  twice daily.  She acknowledges eating more carbs over the last few weeks.  She is aware about the carb insulin  model for obesity and will work on reducing carbs and increasing protein as part of her nutritional strategy.  Continue metformin  at the current dose for diabetes prevention.  Patient also counseled today on the use of sleep aids and anticholinergic side effects.

## 2024-04-12 ENCOUNTER — Other Ambulatory Visit (INDEPENDENT_AMBULATORY_CARE_PROVIDER_SITE_OTHER): Payer: Self-pay | Admitting: Internal Medicine

## 2024-04-12 DIAGNOSIS — R7303 Prediabetes: Secondary | ICD-10-CM

## 2024-04-13 DIAGNOSIS — L820 Inflamed seborrheic keratosis: Secondary | ICD-10-CM | POA: Diagnosis not present

## 2024-04-13 DIAGNOSIS — Z85828 Personal history of other malignant neoplasm of skin: Secondary | ICD-10-CM | POA: Diagnosis not present

## 2024-04-13 DIAGNOSIS — Z08 Encounter for follow-up examination after completed treatment for malignant neoplasm: Secondary | ICD-10-CM | POA: Diagnosis not present

## 2024-04-25 ENCOUNTER — Other Ambulatory Visit (HOSPITAL_BASED_OUTPATIENT_CLINIC_OR_DEPARTMENT_OTHER)

## 2024-04-27 ENCOUNTER — Ambulatory Visit: Payer: Self-pay | Admitting: Family Medicine

## 2024-04-27 ENCOUNTER — Ambulatory Visit (HOSPITAL_BASED_OUTPATIENT_CLINIC_OR_DEPARTMENT_OTHER)
Admission: RE | Admit: 2024-04-27 | Discharge: 2024-04-27 | Disposition: A | Discharge status: Home / Self Care | Source: Ambulatory Visit | Attending: Family Medicine | Admitting: Family Medicine

## 2024-04-27 ENCOUNTER — Telehealth: Payer: Self-pay

## 2024-04-27 DIAGNOSIS — Z780 Asymptomatic menopausal state: Secondary | ICD-10-CM | POA: Diagnosis not present

## 2024-04-27 DIAGNOSIS — M8580 Other specified disorders of bone density and structure, unspecified site: Secondary | ICD-10-CM | POA: Diagnosis not present

## 2024-04-27 DIAGNOSIS — M810 Age-related osteoporosis without current pathological fracture: Secondary | ICD-10-CM | POA: Diagnosis not present

## 2024-04-27 NOTE — Telephone Encounter (Signed)
 Copied from CRM [id number]. Topic: Clinical - Lab/Test Results >> Apr 27, 2024 12:36 PM Shereese L wrote: Reason for CRM: Patient is requesting for the doctor to request the Bone Density results from Dr. Norleen Jobs because he was her previous doctor and order the test

## 2024-04-27 NOTE — Telephone Encounter (Signed)
 Bone density already scanned in chart.

## 2024-05-09 ENCOUNTER — Encounter (INDEPENDENT_AMBULATORY_CARE_PROVIDER_SITE_OTHER): Payer: Self-pay | Admitting: Internal Medicine

## 2024-05-09 ENCOUNTER — Ambulatory Visit (INDEPENDENT_AMBULATORY_CARE_PROVIDER_SITE_OTHER): Payer: Self-pay | Admitting: Internal Medicine

## 2024-05-09 VITALS — BP 113/78 | HR 94 | Temp 98.0°F | Ht 60.0 in | Wt 160.0 lb

## 2024-05-09 DIAGNOSIS — E66811 Obesity, class 1: Secondary | ICD-10-CM | POA: Diagnosis not present

## 2024-05-09 DIAGNOSIS — R7303 Prediabetes: Secondary | ICD-10-CM

## 2024-05-09 DIAGNOSIS — Z6831 Body mass index (BMI) 31.0-31.9, adult: Secondary | ICD-10-CM | POA: Diagnosis not present

## 2024-05-09 NOTE — Assessment & Plan Note (Signed)
 Her hemoglobin A1c is at 5.8 down from previous value of 6.1 but has been staying steady at 5.8 on metformin  twice daily.  She acknowledges eating more carbs in her diet she is aware about the carb insulin  model for obesity and will work on reducing carbs and increasing protein as part of her nutritional strategy.  Continue metformin  at the current dose for diabetes prevention.

## 2024-05-09 NOTE — Progress Notes (Signed)
 Office: [phone]  /  Fax: [phone]  Weight Summary and Body Composition Analysis (BIA)  Vitals Temp: 98 F (36.7 C) BP: 113/78 Pulse Rate: 94 SpO2: 98 %   Anthropometric Measurements Height: 5' (1.524 m) Weight: 160 lb (72.6 kg) BMI (Calculated): 31.25 Weight at Last Visit: 156 lb Weight Lost Since Last Visit: 0 lb Weight Gained Since Last Visit: 4 lb Starting Weight: 159 lb Total Weight Loss (lbs): 0 lb (0 kg) Peak Weight: 175 lb   Body Composition  Body Fat %: 42.8 % Fat Mass (lbs): 68.6 lbs Muscle Mass (lbs): 87.2 lbs Total Body Water  (lbs): 61.2 lbs Visceral Fat Rating : 12    RMR: 1253  Today's Visit #: 21  Starting Date: 09/16/22   Subjective   Chief Complaint: Obesity  Interval History Discussed the use of AI scribe software for clinical note transcription with the patient, who gave verbal consent to proceed.  History of Present Illness Carol [name] is a 73 year old female with prediabetes who presents for medical weight management.  Carol [name] initially lost approximately ten pounds but has since regained weight, returning to her starting weight. Carol [name] has difficulty maintaining a reduced calorie nutrition plan and adequate levels of physical activity. Carol [name] has tried medications such as Lodose, phentermine , and is currently on metformin  off-label for prediabetes prevention.  Carol [name] often skips lunch, leading to only two meals a day, which Carol [name] believes results in insufficient fiber and protein intake. Despite this, Carol [name] does not feel excessively hungry between meals. Carol [name] estimates her daily caloric intake is between 900 to 1200 calories.  Carol [name] experienced a significant weight loss of sixteen pounds over four months in early 2024, during a period when Carol [name] was walking two to three miles twice a week. However, after an injury, her physical activity decreased, contributing to weight regain.  Carol [name] has gained two pounds in muscle and two pounds  in body fat since her last visit, totaling a four-pound weight gain. Carol [name] is part of the [employer] program at [employer], which provides access to fitness facilities. Carol [name] has a partner who is also working on [employer], and Carol [name] plans to increase physical activity together.     Challenges affecting patient progress: low volume of physical activity at present , inadequate response to nutritional and behavioral strategies, and inadequate response to pharmacotherapy.    Pharmacotherapy for weight management: Carol [name] is currently taking Metformin  (off label use for weight management and / or insulin  resistance and / or diabetes prevention) with adequate clinical response  and without side effects..   Assessment and Plan   Treatment Plan For Obesity:  Recommended Dietary Goals  Carol [name] is currently in the action stage of change. As such, her goal is to continue weight management plan. Carol [name] has agreed to: incorporate prepackaged healthy meals for convenience, incorporate 1-2 meal replacements a day for convenience , and continue current plan  Behavioral Health and Counseling  We discussed the following behavioral modification strategies today: work on tracking and journaling calories using tracking application, continue to work on maintaining a reduced calorie state, getting the recommended amount of protein, incorporating whole foods, making healthy choices, staying well hydrated and practicing mindfulness when eating., and increase protein intake, fibrous foods (25 grams per day for women, 30 grams for men) and water  to improve satiety and decrease hunger signals. .  Additional education and resources provided today: Handout on traveling and holiday eating strategies  Recommended Physical Activity Goals  Carol [name] has  been advised to work up to 150 minutes of moderate intensity aerobic activity a week and strengthening exercises 2-3 times per week for cardiovascular health, weight loss maintenance  and preservation of muscle mass.  Carol [name] has agreed to :  Think about enjoyable ways to increase daily physical activity and overcoming barriers to exercise, Increase physical activity in their day and reduce sedentary time (increase NEAT)., Increase volume of physical activity to a goal of 240 minutes a week, and Combine aerobic and strengthening exercises for efficiency and improved cardiometabolic health.  Medical Interventions and Pharmacotherapy  We discussed various medication options to help Carol [name] with her weight loss efforts and we both agreed to : We discussed the availability of liraglutide for treatment with GLP-1.  At present time Carol [name] is not interested in GLP-1 therapy.  Carol [name] did not respond to low-dose phentermine .  Carol [name] is on zonisamide  for neurological reasons.  Continue metformin  at current dose.  Associated Conditions Impacted by Obesity Treatment  Assessment & Plan Prediabetes Her hemoglobin A1c is at 5.8 down from previous value of 6.1 but has been staying steady at 5.8 on metformin  twice daily.  Carol [name] acknowledges eating more carbs in her diet Carol [name] is aware about the carb insulin  model for obesity and will work on reducing carbs and increasing protein as part of her nutritional strategy.  Continue metformin  at the current dose for diabetes prevention.   Class 1 obesity with serious comorbidity and body mass index (BMI) of 31.0 to 31.9 in adult, unspecified obesity type Weight regain after initial loss of 10 pounds, now back to starting weight. Difficulty maintaining reduced calorie nutrition plan and adequate physical activity. Previous use of phentermine  and off-label metformin . Discussed GLP-1 agonists, specifically Victoza, as a more affordable option for weight management and cardiovascular risk reduction. Emphasized the importance of physical activity in weight management, noting her previous success with walking. Discussed potential benefits of GLP-1 agonists for appetite control  and cardiovascular risk reduction. Consideration of aquatic programs and chair yoga for low-impact exercise options. - Consider trial of Victoza for weight management and cardiovascular risk reduction. - Increase physical activity, focusing on walking and low-impact exercises such as chair yoga. - Explore aquatic programs or PREP program for guided exercise. - Track steps and physical activity progress. - Provided information on managing diet during holidays. -Continue metformin  at current dose we cannot increase medication due to the presence of zonisamide  -Did not respond to low-dose phentermine  in the past        Objective   Physical Exam:  Blood pressure 113/78, pulse 94, temperature 98 F (36.7 C), height 5' (1.524 m), weight 160 lb (72.6 kg), last menstrual period 06/23/1990, SpO2 98%. Body mass index is 31.25 kg/m.  General: Carol [name] is overweight, cooperative, alert, well developed, and in no acute distress. PSYCH: Has normal mood, affect and thought process.   HEENT: EOMI, sclerae are anicteric. Lungs: Normal breathing effort, no conversational dyspnea. Extremities: No edema.  Neurologic: No gross sensory or motor deficits. No tremors or fasciculations noted.    Diagnostic Data Reviewed:  BMET    Component Value Date/Time   NA 139 02/01/2024 1600   NA 141 07/23/2023 1133   K 4.4 02/01/2024 1600   CL 106 02/01/2024 1600   CO2 24 02/01/2024 1600   GLUCOSE 102 (H) 02/01/2024 1600   BUN 22 02/01/2024 1600   BUN 16 07/23/2023 1133   CREATININE 0.91 02/01/2024 1600   CREATININE 0.76 03/16/2017 1354   CALCIUM  9.6 02/01/2024 1600  GFRNONAA >60 09/21/2023 1552   GFRAA 79 10/21/2019 1326   Lab Results  Component Value Date   HGBA1C 5.8 (H) 02/16/2024   HGBA1C 5.6 02/22/2013   Lab Results  Component Value Date   INSULIN  11.1 09/16/2022   Lab Results  Component Value Date   TSH 2.380 08/18/2022   CBC    Component Value Date/Time   WBC 9.3 02/01/2024 1600    RBC 4.87 02/01/2024 1600   HGB 13.8 02/01/2024 1600   HGB 14.3 07/23/2023 1133   HGB 14.3 05/21/2016 1128   HCT 41.3 02/01/2024 1600   HCT 44.1 07/23/2023 1133   PLT 348.0 02/01/2024 1600   PLT 313 07/23/2023 1133   MCV 84.7 02/01/2024 1600   MCV 86 07/23/2023 1133   MCH 27.9 09/21/2023 1552   MCHC 33.4 02/01/2024 1600   RDW 13.5 02/01/2024 1600   RDW 13.6 07/23/2023 1133   Iron Studies    Component Value Date/Time   IRON 47 10/22/2022 1430   TIBC 323 10/22/2022 1430   FERRITIN 166 (H) 10/22/2022 1430   IRONPCTSAT 15 10/22/2022 1430   Lipid Panel     Component Value Date/Time   CHOL 186 02/16/2024 1147   TRIG 196 (H) 02/16/2024 1147   HDL 47 02/16/2024 1147   CHOLHDL 3.4 07/23/2023 1133   CHOLHDL 3.2 05/21/2016 1407   VLDL 44 (H) 05/21/2016 1407   LDLCALC 105 (H) 02/16/2024 1147   Hepatic Function Panel     Component Value Date/Time   PROT 7.4 02/01/2024 1600   PROT 6.8 07/23/2023 1133   ALBUMIN  4.4 02/01/2024 1600   ALBUMIN  4.5 07/23/2023 1133   AST 20 02/01/2024 1600   ALT 23 02/01/2024 1600   ALKPHOS 88 02/01/2024 1600   BILITOT 0.3 02/01/2024 1600   BILITOT 0.2 07/23/2023 1133      Component Value Date/Time   TSH 2.380 08/18/2022 1310   Nutritional Lab Results  Component Value Date   VD25OH 51.2 04/23/2022   VD25OH 39.4 05/27/2017   VD25OH 31 05/21/2016    Medications: Outpatient Encounter Medications as of 05/09/2024  Medication Sig   albuterol  (PROAIR  HFA) 108 (90 Base) MCG/ACT inhaler INHALE 2 PUFFS INTO THE LUNGS EVERY 6 HOURS AS NEEDED FOR WHEEZING/ SHORTNESS OF BREATH.   Apoaequorin (PREVAGEN) 10 MG CAPS Take by mouth in the morning.   atorvastatin  (LIPITOR) 40 MG tablet Take 1 tablet (40 mg total) by mouth daily.   diclofenac sodium (VOLTAREN) 1 % GEL Apply 2 g topically 4 (four) times daily.   ibandronate  (BONIVA ) 150 MG tablet Take in the morning with a full glass of water  once a month, on an empty stomach, do not take anything else by  mouth or lie down for the next 30 min.   metFORMIN  (GLUCOPHAGE -XR) 500 MG 24 hr tablet TAKE 1 TABLET BY MOUTH 2 TIMES DAILY WITH A MEAL.   methocarbamol (ROBAXIN) 500 MG tablet Take 500 mg by mouth daily as needed (migraine).   Multiple Vitamins-Minerals (CENTRUM SILVER 50+WOMEN PO) Take by mouth in the morning.   NON FORMULARY Take 1 tablet by mouth daily. Viviscal Hair growth supplement   pantoprazole  (PROTONIX ) 40 MG tablet Take 1 tablet (40 mg total) by mouth daily.   valACYclovir  (VALTREX ) 500 MG tablet Take 1 tablet (500 mg total) by mouth daily.   zonisamide  (ZONEGRAN ) 100 MG capsule Take 100 mg by mouth 2 (two) times daily.   No facility-administered encounter medications on file as of 05/09/2024.  Follow-Up   Return in about 7 weeks (around 06/27/2024) for For Weight Mangement with Dr. Francyne.SABRA Carol [name] was informed of the importance of frequent follow up visits to maximize her success with intensive lifestyle modifications for her multiple health conditions.  Attestation Statement   Reviewed by clinician on day of visit: allergies, medications, problem list, medical history, surgical history, family history, social history, and previous encounter notes.     Lucas Francyne, MD

## 2024-05-09 NOTE — Assessment & Plan Note (Signed)
 Weight regain after initial loss of 10 pounds, now back to starting weight. Difficulty maintaining reduced calorie nutrition plan and adequate physical activity. Previous use of phentermine  and off-label metformin . Discussed GLP-1 agonists, specifically Victoza, as a more affordable option for weight management and cardiovascular risk reduction. Emphasized the importance of physical activity in weight management, noting her previous success with walking. Discussed potential benefits of GLP-1 agonists for appetite control and cardiovascular risk reduction. Consideration of aquatic programs and chair yoga for low-impact exercise options. - Consider trial of Victoza for weight management and cardiovascular risk reduction. - Increase physical activity, focusing on walking and low-impact exercises such as chair yoga. - Explore aquatic programs or PREP program for guided exercise. - Track steps and physical activity progress. - Provided information on managing diet during holidays. -Continue metformin  at current dose we cannot increase medication due to the presence of zonisamide  -Did not respond to low-dose phentermine  in the past

## 2024-05-23 ENCOUNTER — Other Ambulatory Visit: Payer: Self-pay | Admitting: Internal Medicine

## 2024-05-23 DIAGNOSIS — N6489 Other specified disorders of breast: Secondary | ICD-10-CM

## 2024-05-27 DIAGNOSIS — Z6830 Body mass index (BMI) 30.0-30.9, adult: Secondary | ICD-10-CM | POA: Diagnosis not present

## 2024-05-27 DIAGNOSIS — R059 Cough, unspecified: Secondary | ICD-10-CM | POA: Diagnosis not present

## 2024-05-27 DIAGNOSIS — R0981 Nasal congestion: Secondary | ICD-10-CM | POA: Diagnosis not present

## 2024-06-28 ENCOUNTER — Encounter (INDEPENDENT_AMBULATORY_CARE_PROVIDER_SITE_OTHER): Payer: Self-pay | Admitting: Internal Medicine

## 2024-06-28 ENCOUNTER — Ambulatory Visit (INDEPENDENT_AMBULATORY_CARE_PROVIDER_SITE_OTHER): Admitting: Internal Medicine

## 2024-06-28 VITALS — BP 116/75 | HR 86 | Temp 98.7°F | Ht 60.0 in | Wt 162.0 lb

## 2024-06-28 DIAGNOSIS — R7303 Prediabetes: Secondary | ICD-10-CM

## 2024-06-28 DIAGNOSIS — E66811 Obesity, class 1: Secondary | ICD-10-CM | POA: Diagnosis not present

## 2024-06-28 DIAGNOSIS — E785 Hyperlipidemia, unspecified: Secondary | ICD-10-CM | POA: Diagnosis not present

## 2024-06-28 DIAGNOSIS — Z6831 Body mass index (BMI) 31.0-31.9, adult: Secondary | ICD-10-CM

## 2024-06-28 NOTE — Assessment & Plan Note (Signed)
 She will work on incorporating 1 meal replacement a day and tracking calories for a target between 900 to 1000 kcal/day. She will continue to work on increasing volume of physical activity .  Continue metformin  for pharmacotherapy and diabetes prevention She does not have coverage for GLP-1 She did not respond to low-dose phentermine  She will continue atorvastatin  for cholesterol

## 2024-06-28 NOTE — Progress Notes (Signed)
 "  Office: [phone]  /  Fax: [phone]  Weight Summary and Body Composition Analysis (BIA)  Vitals Temp: 98.7 F (37.1 C) BP: 116/75 Pulse Rate: 86 SpO2: 98 %   Anthropometric Measurements Height: 5' (1.524 m) Weight: 162 lb (73.5 kg) BMI (Calculated): 31.64 Weight at Last Visit: 160 lb Weight Lost Since Last Visit: 0 lb Weight Gained Since Last Visit: 2 lb Starting Weight: 159 lb Total Weight Loss (lbs): 0 lb (0 kg) Peak Weight: 175 lb   Body Composition  Body Fat %: 42.1 % Fat Mass (lbs): 68.6 lbs Muscle Mass (lbs): 89.4 lbs Total Body Water  (lbs): 12 lbs    RMR: 1253  Today's Visit #: 22  Starting Date: 09/16/22   Subjective   Chief Complaint: Obesity  Interval History  Discussed the use of AI scribe software for clinical note transcription with the patient, who gave verbal consent to proceed.  History of Present Illness Patient presents today for medical weight management associated conditions include prediabetes and hypercholesterolemia.  Since last office visit she has gained 2 pounds she is following 1200-calorie low-carb meal plan 60% of the time she is exercising 4 days a week 30 minutes started last week she is doing walking tai chi and chair yoga with her wife.  Her wife has also joined her journey so she is working on maintaining a supportive home environment and aligning new prepping and planning.  She has cut down processed foods and eating out.     Challenges affecting patient progress: menopause, inadequate response to nutritional and behavioral strategies, and inadequate response to pharmacotherapy.    Pharmacotherapy for weight management: She is currently taking Metformin  (off label use for weight management and / or insulin  resistance and / or diabetes prevention) with adequate clinical response  and without side effects..   Assessment and Plan   Treatment Plan For Obesity:  Recommended Dietary Goals  Carol [name] is currently in the  action stage of change. As such, her goal is to continue weight management plan. She has agreed to: keep a food journal with a target of  900 calories per day and 75 grams of protein per day or  25 grams per meal., incorporate prepackaged healthy meals for convenience, and incorporate 1-2 meal replacements a day for convenience   Behavioral Health and Counseling  We discussed the following behavioral modification strategies today: work on meal planning and preparation, work on tracking and journaling calories using tracking application, continue to work on maintaining a reduced calorie state, getting the recommended amount of protein, incorporating whole foods, making healthy choices, staying well hydrated and practicing mindfulness when eating., and increase protein intake, fibrous foods (25 grams per day for women, 30 grams for men) and water  to improve satiety and decrease hunger signals. .  Additional education and resources provided today: None  Recommended Physical Activity Goals  Carol [name] has been advised to work up to 150 minutes of moderate intensity aerobic activity a week and strengthening exercises 2-3 times per week for cardiovascular health, weight loss maintenance and preservation of muscle mass.  She has agreed to :  Continue to gradually increase the amount and intensity of exercise routine  Medical Interventions and Pharmacotherapy  We discussed various medication options to help Carol [name] with her weight loss efforts and we both agreed to : She did not respond to low-dose phentermine .  She will continue on metformin  for diabetes prevention.  She does not have coverage for GLP-1 we have discussed the benefits and associated  cost and challenges with coverage.  At present time she does not have a Medicare recognized condition for coverage.  Associated Conditions Impacted by Obesity Treatment  Assessment & Plan Class 1 obesity with serious comorbidity and body mass index (BMI) of 31.0  to 31.9 in adult, unspecified obesity type Hyperlipidemia with target LDL less than 100 Prediabetes She will work on incorporating 1 meal replacement a day and tracking calories for a target between 900 to 1000 kcal/day. She will continue to work on increasing volume of physical activity .  Continue metformin  for pharmacotherapy and diabetes prevention She does not have coverage for GLP-1 She did not respond to low-dose phentermine  She will continue atorvastatin  for cholesterol          Objective   Physical Exam:  Blood pressure 116/75, pulse 86, temperature 98.7 F (37.1 C), height 5' (1.524 m), weight 162 lb (73.5 kg), last menstrual period 06/23/1990, SpO2 98%. Body mass index is 31.64 kg/m.  General: She is overweight, cooperative, alert, well developed, and in no acute distress. PSYCH: Has normal mood, affect and thought process.   HEENT: EOMI, sclerae are anicteric. Lungs: Normal breathing effort, no conversational dyspnea. Extremities: No edema.  Neurologic: No gross sensory or motor deficits. No tremors or fasciculations noted.    Diagnostic Data Reviewed:  BMET    Component Value Date/Time   NA 139 02/01/2024 1600   NA 141 07/23/2023 1133   K 4.4 02/01/2024 1600   CL 106 02/01/2024 1600   CO2 24 02/01/2024 1600   GLUCOSE 102 (H) 02/01/2024 1600   BUN 22 02/01/2024 1600   BUN 16 07/23/2023 1133   CREATININE 0.91 02/01/2024 1600   CREATININE 0.76 03/16/2017 1354   CALCIUM  9.6 02/01/2024 1600   GFRNONAA >60 09/21/2023 1552   GFRAA 79 10/21/2019 1326   Lab Results  Component Value Date   HGBA1C 5.8 (H) 02/16/2024   HGBA1C 5.6 02/22/2013   Lab Results  Component Value Date   INSULIN  11.1 09/16/2022   Lab Results  Component Value Date   TSH 2.380 08/18/2022   CBC    Component Value Date/Time   WBC 9.3 02/01/2024 1600   RBC 4.87 02/01/2024 1600   HGB 13.8 02/01/2024 1600   HGB 14.3 07/23/2023 1133   HGB 14.3 05/21/2016 1128   HCT 41.3  02/01/2024 1600   HCT 44.1 07/23/2023 1133   PLT 348.0 02/01/2024 1600   PLT 313 07/23/2023 1133   MCV 84.7 02/01/2024 1600   MCV 86 07/23/2023 1133   MCH 27.9 09/21/2023 1552   MCHC 33.4 02/01/2024 1600   RDW 13.5 02/01/2024 1600   RDW 13.6 07/23/2023 1133   Iron Studies    Component Value Date/Time   IRON 47 10/22/2022 1430   TIBC 323 10/22/2022 1430   FERRITIN 166 (H) 10/22/2022 1430   IRONPCTSAT 15 10/22/2022 1430   Lipid Panel     Component Value Date/Time   CHOL 186 02/16/2024 1147   TRIG 196 (H) 02/16/2024 1147   HDL 47 02/16/2024 1147   CHOLHDL 3.4 07/23/2023 1133   CHOLHDL 3.2 05/21/2016 1407   VLDL 44 (H) 05/21/2016 1407   LDLCALC 105 (H) 02/16/2024 1147   Hepatic Function Panel     Component Value Date/Time   PROT 7.4 02/01/2024 1600   PROT 6.8 07/23/2023 1133   ALBUMIN  4.4 02/01/2024 1600   ALBUMIN  4.5 07/23/2023 1133   AST 20 02/01/2024 1600   ALT 23 02/01/2024 1600   ALKPHOS 88 02/01/2024  1600   BILITOT 0.3 02/01/2024 1600   BILITOT 0.2 07/23/2023 1133      Component Value Date/Time   TSH 2.380 08/18/2022 1310   Nutritional Lab Results  Component Value Date   VD25OH 51.2 04/23/2022   VD25OH 39.4 05/27/2017   VD25OH 31 05/21/2016    Medications: Outpatient Encounter Medications as of 06/28/2024  Medication Sig   albuterol  (PROAIR  HFA) 108 (90 Base) MCG/ACT inhaler INHALE 2 PUFFS INTO THE LUNGS EVERY 6 HOURS AS NEEDED FOR WHEEZING/ SHORTNESS OF BREATH.   Apoaequorin (PREVAGEN) 10 MG CAPS Take by mouth in the morning.   atorvastatin  (LIPITOR) 40 MG tablet Take 1 tablet (40 mg total) by mouth daily.   diclofenac sodium (VOLTAREN) 1 % GEL Apply 2 g topically 4 (four) times daily.   metFORMIN  (GLUCOPHAGE -XR) 500 MG 24 hr tablet TAKE 1 TABLET BY MOUTH 2 TIMES DAILY WITH A MEAL.   methocarbamol (ROBAXIN) 500 MG tablet Take 500 mg by mouth daily as needed (migraine).   Multiple Vitamins-Minerals (CENTRUM SILVER 50+WOMEN PO) Take by mouth in the  morning.   NON FORMULARY Take 1 tablet by mouth daily. Viviscal Hair growth supplement   pantoprazole  (PROTONIX ) 40 MG tablet Take 1 tablet (40 mg total) by mouth daily.   valACYclovir  (VALTREX ) 500 MG tablet Take 1 tablet (500 mg total) by mouth daily.   zonisamide  (ZONEGRAN ) 100 MG capsule Take 100 mg by mouth 2 (two) times daily.   [DISCONTINUED] ibandronate  (BONIVA ) 150 MG tablet Take in the morning with a full glass of water  once a month, on an empty stomach, do not take anything else by mouth or lie down for the next 30 min.   No facility-administered encounter medications on file as of 06/28/2024.     Follow-Up   Return in about 6 weeks (around 08/09/2024) for For Weight Mangement with Dr. Francyne.SABRA She was informed of the importance of frequent follow up visits to maximize her success with intensive lifestyle modifications for her multiple health conditions.  Attestation Statement   Reviewed by clinician on day of visit: allergies, medications, problem list, medical history, surgical history, family history, social history, and previous encounter notes.     Lucas Francyne, MD  "

## 2024-07-04 ENCOUNTER — Other Ambulatory Visit: Payer: Self-pay | Admitting: Internal Medicine

## 2024-07-04 ENCOUNTER — Ambulatory Visit
Admission: RE | Admit: 2024-07-04 | Discharge: 2024-07-04 | Disposition: A | Source: Ambulatory Visit | Attending: Internal Medicine

## 2024-07-04 DIAGNOSIS — N6489 Other specified disorders of breast: Secondary | ICD-10-CM

## 2024-07-13 ENCOUNTER — Other Ambulatory Visit (INDEPENDENT_AMBULATORY_CARE_PROVIDER_SITE_OTHER): Payer: Self-pay | Admitting: Internal Medicine

## 2024-07-13 DIAGNOSIS — R7303 Prediabetes: Secondary | ICD-10-CM

## 2024-07-21 ENCOUNTER — Telehealth: Payer: Self-pay

## 2024-07-21 ENCOUNTER — Other Ambulatory Visit: Payer: Self-pay | Admitting: Family Medicine

## 2024-07-21 ENCOUNTER — Other Ambulatory Visit: Payer: Self-pay

## 2024-07-21 DIAGNOSIS — K219 Gastro-esophageal reflux disease without esophagitis: Secondary | ICD-10-CM

## 2024-07-21 MED ORDER — PANTOPRAZOLE SODIUM 40 MG PO TBEC: 40.0000 mg | DELAYED_RELEASE_TABLET | Freq: Every day | ORAL | 3 refills | Status: AC

## 2024-07-21 NOTE — Telephone Encounter (Signed)
 Refill sent today.

## 2024-07-21 NOTE — Telephone Encounter (Signed)
 Copied from CRM #[id number]. Topic: Clinical - Medication Refill >> Jul 21, 2024 11:28 AM Adelita E wrote: Medication: pantoprazole  (PROTONIX ) 40 MG tablet  *Patient requesting Dr. Geofm to fill as previous provider that prescribed this medication will be retiring.  Has the patient contacted their pharmacy? Yes (Agent: If no, request that the patient contact the pharmacy for the refill. If patient does not wish to contact the pharmacy document the reason why and proceed with request.) (Agent: If yes, when and what did the pharmacy advise?)  This is the patient's preferred pharmacy:  [facility] [place], [place] - [address] [place] [address] Phone: [phone] Fax: [phone]    Is this the correct pharmacy for this prescription? Yes If no, delete pharmacy and type the correct one.   Has the prescription been filled recently? No  Is the patient out of the medication? No, 5 days left  Has the patient been seen for an appointment in the last year OR does the patient have an upcoming appointment? Yes  Can we respond through MyChart? Yes  Agent: Please be advised that Rx refills may take up to 3 business days. We ask that you follow-up with your pharmacy.

## 2024-08-09 ENCOUNTER — Ambulatory Visit (INDEPENDENT_AMBULATORY_CARE_PROVIDER_SITE_OTHER): Admitting: Internal Medicine

## 2024-08-17 ENCOUNTER — Ambulatory Visit

## 2024-09-01 ENCOUNTER — Ambulatory Visit: Payer: PPO | Admitting: Family Medicine

## 2024-09-01 ENCOUNTER — Encounter: Admitting: Internal Medicine

## 2024-12-28 ENCOUNTER — Ambulatory Visit (HOSPITAL_BASED_OUTPATIENT_CLINIC_OR_DEPARTMENT_OTHER): Payer: Self-pay | Admitting: Obstetrics & Gynecology
